# Patient Record
Sex: Female | Born: 1960 | Race: White | Hispanic: No | Smoking: Current every day smoker
Health system: Southern US, Community
[De-identification: ages and names within clinical notes are randomized; demographics above are authoritative.]

## PROBLEM LIST (undated history)

## (undated) DIAGNOSIS — C801 Malignant (primary) neoplasm, unspecified: Secondary | ICD-10-CM

## (undated) DIAGNOSIS — D494 Neoplasm of unspecified behavior of bladder: Secondary | ICD-10-CM

## (undated) DIAGNOSIS — T8859XA Other complications of anesthesia, initial encounter: Secondary | ICD-10-CM

## (undated) DIAGNOSIS — R053 Chronic cough: Secondary | ICD-10-CM

## (undated) DIAGNOSIS — R05 Cough: Secondary | ICD-10-CM

## (undated) DIAGNOSIS — T4145XA Adverse effect of unspecified anesthetic, initial encounter: Secondary | ICD-10-CM

---
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
## 2000-11-27 ENCOUNTER — Encounter: Payer: Self-pay | Admitting: *Deleted

## 2000-11-27 ENCOUNTER — Ambulatory Visit (HOSPITAL_COMMUNITY): Admission: RE | Admit: 2000-11-27 | Discharge: 2000-11-27 | Payer: Self-pay | Admitting: *Deleted

## 2000-11-30 ENCOUNTER — Ambulatory Visit (HOSPITAL_COMMUNITY): Admission: RE | Admit: 2000-11-30 | Discharge: 2000-11-30 | Payer: Self-pay | Admitting: *Deleted

## 2000-11-30 ENCOUNTER — Encounter: Payer: Self-pay | Admitting: *Deleted

## 2002-06-27 ENCOUNTER — Emergency Department (HOSPITAL_COMMUNITY): Admission: EM | Admit: 2002-06-27 | Discharge: 2002-06-27 | Payer: Self-pay | Admitting: Emergency Medicine

## 2002-06-27 ENCOUNTER — Encounter: Payer: Self-pay | Admitting: Emergency Medicine

## 2002-11-25 ENCOUNTER — Emergency Department (HOSPITAL_COMMUNITY): Admission: EM | Admit: 2002-11-25 | Discharge: 2002-11-25 | Payer: Self-pay

## 2002-12-06 ENCOUNTER — Encounter: Payer: Self-pay | Admitting: *Deleted

## 2002-12-06 ENCOUNTER — Ambulatory Visit (HOSPITAL_COMMUNITY): Admission: RE | Admit: 2002-12-06 | Discharge: 2002-12-06 | Payer: Self-pay | Admitting: *Deleted

## 2003-10-07 ENCOUNTER — Emergency Department (HOSPITAL_COMMUNITY): Admission: EM | Admit: 2003-10-07 | Discharge: 2003-10-07 | Payer: Self-pay | Admitting: Emergency Medicine

## 2006-07-19 ENCOUNTER — Emergency Department (HOSPITAL_COMMUNITY): Admission: EM | Admit: 2006-07-19 | Discharge: 2006-07-19 | Payer: Self-pay | Admitting: Emergency Medicine

## 2015-05-13 ENCOUNTER — Emergency Department (HOSPITAL_COMMUNITY)
Admission: EM | Admit: 2015-05-13 | Discharge: 2015-05-13 | Disposition: A | Discharge status: Home / Self Care | Payer: No Typology Code available for payment source | Attending: Emergency Medicine | Admitting: Emergency Medicine

## 2015-05-13 ENCOUNTER — Encounter (HOSPITAL_COMMUNITY): Payer: Self-pay | Admitting: Emergency Medicine

## 2015-05-13 ENCOUNTER — Emergency Department (HOSPITAL_COMMUNITY): Payer: No Typology Code available for payment source

## 2015-05-13 DIAGNOSIS — F17200 Nicotine dependence, unspecified, uncomplicated: Secondary | ICD-10-CM | POA: Insufficient documentation

## 2015-05-13 DIAGNOSIS — Y998 Other external cause status: Secondary | ICD-10-CM | POA: Insufficient documentation

## 2015-05-13 DIAGNOSIS — Y9389 Activity, other specified: Secondary | ICD-10-CM | POA: Diagnosis not present

## 2015-05-13 DIAGNOSIS — S0093XA Contusion of unspecified part of head, initial encounter: Secondary | ICD-10-CM

## 2015-05-13 DIAGNOSIS — S0990XA Unspecified injury of head, initial encounter: Secondary | ICD-10-CM | POA: Insufficient documentation

## 2015-05-13 DIAGNOSIS — Y92410 Unspecified street and highway as the place of occurrence of the external cause: Secondary | ICD-10-CM | POA: Diagnosis not present

## 2015-05-13 DIAGNOSIS — S0083XA Contusion of other part of head, initial encounter: Secondary | ICD-10-CM | POA: Insufficient documentation

## 2015-05-13 DIAGNOSIS — S4992XA Unspecified injury of left shoulder and upper arm, initial encounter: Secondary | ICD-10-CM | POA: Diagnosis not present

## 2015-05-13 DIAGNOSIS — S4991XA Unspecified injury of right shoulder and upper arm, initial encounter: Secondary | ICD-10-CM | POA: Insufficient documentation

## 2015-05-13 DIAGNOSIS — S161XXA Strain of muscle, fascia and tendon at neck level, initial encounter: Secondary | ICD-10-CM | POA: Insufficient documentation

## 2015-05-13 DIAGNOSIS — S0993XA Unspecified injury of face, initial encounter: Secondary | ICD-10-CM | POA: Diagnosis present

## 2015-05-13 MED ORDER — HYDROCODONE-ACETAMINOPHEN 5-325 MG PO TABS: 1.0000 | ORAL_TABLET | Freq: Four times a day (QID) | ORAL | Status: DC | PRN
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
## 2015-05-13 NOTE — ED Notes (Signed)
Pt stable, ambulatory, states understanding of discharge instructions 
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
## 2015-05-13 NOTE — Discharge Instructions (Signed)
Continue to take ibuprofen as needed for pain. Apply to the areas. Return if symptoms worsen. Do not drive if you take the narcotic as it will make you sleepy.

## 2018-08-28 ENCOUNTER — Other Ambulatory Visit: Payer: Self-pay | Admitting: Family Medicine

## 2018-08-28 ENCOUNTER — Other Ambulatory Visit: Payer: Self-pay

## 2018-08-28 ENCOUNTER — Ambulatory Visit
Admission: RE | Admit: 2018-08-28 | Discharge: 2018-08-28 | Disposition: A | Discharge status: Home / Self Care | Payer: No Typology Code available for payment source | Source: Ambulatory Visit | Attending: Family Medicine | Admitting: Family Medicine

## 2018-08-28 DIAGNOSIS — M545 Low back pain, unspecified: Secondary | ICD-10-CM

## 2018-08-28 DIAGNOSIS — M79604 Pain in right leg: Secondary | ICD-10-CM

## 2018-10-15 ENCOUNTER — Other Ambulatory Visit (HOSPITAL_COMMUNITY): Payer: Self-pay | Admitting: Urology

## 2018-10-15 ENCOUNTER — Other Ambulatory Visit: Payer: Self-pay | Admitting: Urology

## 2018-10-15 DIAGNOSIS — D414 Neoplasm of uncertain behavior of bladder: Secondary | ICD-10-CM

## 2018-10-18 ENCOUNTER — Other Ambulatory Visit (HOSPITAL_COMMUNITY): Payer: Self-pay | Admitting: Urology

## 2018-10-18 ENCOUNTER — Ambulatory Visit (HOSPITAL_COMMUNITY)
Admission: RE | Admit: 2018-10-18 | Discharge: 2018-10-18 | Disposition: A | Discharge status: Home / Self Care | Payer: Medicaid Other | Source: Ambulatory Visit | Attending: Urology | Admitting: Urology

## 2018-10-18 ENCOUNTER — Encounter (HOSPITAL_COMMUNITY): Payer: Self-pay

## 2018-10-18 ENCOUNTER — Other Ambulatory Visit: Payer: Self-pay

## 2018-10-18 DIAGNOSIS — D414 Neoplasm of uncertain behavior of bladder: Secondary | ICD-10-CM | POA: Diagnosis not present

## 2018-10-18 DIAGNOSIS — D494 Neoplasm of unspecified behavior of bladder: Secondary | ICD-10-CM

## 2018-10-18 LAB — POCT I-STAT CREATININE: Creatinine, Ser: 0.7 mg/dL (ref 0.44–1.00)

## 2018-10-18 MED ORDER — SODIUM CHLORIDE (PF) 0.9 % IJ SOLN
INTRAMUSCULAR | Status: AC
  Filled 2018-10-18: qty 50

## 2018-10-18 MED ORDER — IOHEXOL 300 MG/ML  SOLN
100.0000 mL | Freq: Once | INTRAMUSCULAR | Status: AC | PRN
  Administered 2018-10-18: 100 mL via INTRAVENOUS

## 2018-10-18 MED ORDER — SODIUM CHLORIDE 0.9 % IV SOLN
INTRAVENOUS | Status: AC
  Filled 2018-10-18: qty 250

## 2018-10-22 ENCOUNTER — Other Ambulatory Visit: Payer: Self-pay | Admitting: Urology

## 2018-10-23 ENCOUNTER — Other Ambulatory Visit (HOSPITAL_COMMUNITY): Payer: Self-pay

## 2018-10-23 ENCOUNTER — Ambulatory Visit (HOSPITAL_COMMUNITY): Payer: Self-pay

## 2018-10-25 NOTE — Patient Instructions (Addendum)
YOU NEED TO HAVE A COVID 19 TEST ON______Wednesday, July 6th_______, THIS TEST MUST BE DONE BEFORE SURGERY, COME TO [place] ENTRANCE. ONCE YOUR COVID TEST IS COMPLETED, PLEASE BEGIN THE QUARANTINE INSTRUCTIONS AS OUTLINED IN YOUR HANDOUT.                  Lauren Salazar      Your procedure is scheduled on: 10-31-2018    Report to [facility] Main  Entrance    Report to admitting at 8:30 AM      Call this number if you have problems the morning of surgery [phone]    Remember: Do not eat food or drink liquids :After Midnight. BRUSH YOUR TEETH MORNING OF SURGERY AND RINSE YOUR MOUTH OUT, NO CHEWING GUM CANDY OR MINTS.     Take these medicines the morning of surgery with A SIP OF WATER: tramadol if needed                                You may not have any metal on your body including hair pins and              piercings  Do not wear jewelry, make-up, lotions, powders or perfumes, deodorant             Do not wear nail polish.  Do not shave  48 hours prior to surgery.                 Do not bring valuables to the hospital. [place].  Contacts, dentures or bridgework may not be worn into surgery.      Patients discharged the day of surgery will not be allowed to drive home. IF YOU ARE HAVING SURGERY AND GOING HOME THE SAME DAY, YOU MUST HAVE AN ADULT TO DRIVE YOU HOME AND BE WITH YOU FOR 24 HOURS. YOU MAY GO HOME BY TAXI OR UBER OR ORTHERWISE, BUT AN ADULT MUST ACCOMPANY YOU HOME AND STAY WITH YOU FOR 24 HOURS.  Name and phone number of your driver:  Special Instructions: N/A              Please read over the following fact sheets you were given: _____________________________________________________________________             [facility] - Preparing for Surgery Before surgery, you can play an important role.  Because skin is not sterile, your skin needs to be as free of germs  as possible.  You can reduce the number of germs on your skin by washing with CHG (chlorahexidine gluconate) soap before surgery.  CHG is an antiseptic cleaner which kills germs and bonds with the skin to continue killing germs even after washing. Please DO NOT use if you have an allergy to CHG or antibacterial soaps.  If your skin becomes reddened/irritated stop using the CHG and inform your nurse when you arrive at Short Stay. Do not shave (including legs and underarms) for at least 48 hours prior to the first CHG shower.  You may shave your face/neck. Please follow these instructions carefully:  1.  Shower with CHG Soap the night before surgery and the  morning of Surgery.  2.  If you choose to wash your hair, wash your hair first as usual with your  normal  shampoo.  3.  After you shampoo, rinse your hair and body thoroughly to remove the  shampoo.                           4.  Use CHG as you would any other liquid soap.  You can apply chg directly  to the skin and wash                       Gently with a scrungie or clean washcloth.  5.  Apply the CHG Soap to your body ONLY FROM THE NECK DOWN.   Do not use on face/ open                           Wound or open sores. Avoid contact with eyes, ears mouth and genitals (private parts).                       Wash face,  Genitals (private parts) with your normal soap.             6.  Wash thoroughly, paying special attention to the area where your surgery  will be performed.  7.  Thoroughly rinse your body with warm water from the neck down.  8.  DO NOT shower/wash with your normal soap after using and rinsing off  the CHG Soap.                9.  Pat yourself dry with a clean towel.            10.  Wear clean pajamas.            11.  Place clean sheets on your bed the night of your first shower and do not  sleep with pets. Day of Surgery : Do not apply any lotions/deodorants the morning of surgery.  Please wear clean clothes to the hospital/surgery  center.  FAILURE TO FOLLOW THESE INSTRUCTIONS MAY RESULT IN THE CANCELLATION OF YOUR SURGERY PATIENT SIGNATURE_________________________________  NURSE SIGNATURE__________________________________  ________________________________________________________________________

## 2018-10-29 ENCOUNTER — Other Ambulatory Visit: Payer: Self-pay

## 2018-10-29 ENCOUNTER — Encounter (HOSPITAL_COMMUNITY): Payer: Self-pay

## 2018-10-29 ENCOUNTER — Encounter (HOSPITAL_COMMUNITY)
Admission: RE | Admit: 2018-10-29 | Discharge: 2018-10-29 | Disposition: A | Discharge status: Home / Self Care | Payer: Self-pay | Source: Ambulatory Visit | Attending: Urology | Admitting: Urology

## 2018-10-29 ENCOUNTER — Other Ambulatory Visit (HOSPITAL_COMMUNITY)
Admission: RE | Admit: 2018-10-29 | Discharge: 2018-10-29 | Disposition: A | Discharge status: Home / Self Care | Payer: Self-pay | Source: Ambulatory Visit

## 2018-10-29 DIAGNOSIS — Z01812 Encounter for preprocedural laboratory examination: Secondary | ICD-10-CM | POA: Insufficient documentation

## 2018-10-29 DIAGNOSIS — C679 Malignant neoplasm of bladder, unspecified: Secondary | ICD-10-CM | POA: Insufficient documentation

## 2018-10-29 DIAGNOSIS — Z1159 Encounter for screening for other viral diseases: Secondary | ICD-10-CM | POA: Insufficient documentation

## 2018-10-29 HISTORY — DX: Other complications of anesthesia, initial encounter: T88.59XA

## 2018-10-29 HISTORY — DX: Chronic cough: R05.3

## 2018-10-29 HISTORY — DX: Neoplasm of unspecified behavior of bladder: D49.4

## 2018-10-29 LAB — BASIC METABOLIC PANEL
Anion gap: 8 (ref 5–15)
BUN: 11 mg/dL (ref 6–20)
CO2: 28 mmol/L (ref 22–32)
Calcium: 8.8 mg/dL — ABNORMAL LOW (ref 8.9–10.3)
Chloride: 102 mmol/L (ref 98–111)
Creatinine, Ser: 0.67 mg/dL (ref 0.44–1.00)
GFR calc Af Amer: >60 mL/min (ref >60)
GFR calc non Af Amer: >60 mL/min (ref >60)
Glucose, Bld: 137 mg/dL — ABNORMAL HIGH (ref 70–99)
Potassium: 3.8 mmol/L (ref 3.5–5.1)
Sodium: 138 mmol/L (ref 135–145)

## 2018-10-29 LAB — CBC
HCT: 42 % (ref 36.0–46.0)
Hemoglobin: 13.1 g/dL (ref 12.0–15.0)
MCH: 29.7 pg (ref 26.0–34.0)
MCHC: 31.2 g/dL (ref 30.0–36.0)
MCV: 95.2 fL (ref 80.0–100.0)
Platelets: 310 10*3/uL (ref 150–400)
RBC: 4.41 MIL/uL (ref 3.87–5.11)
RDW: 12.9 % (ref 11.5–15.5)
WBC: 10.9 10*3/uL — ABNORMAL HIGH (ref 4.0–10.5)
nRBC: 0 % (ref 0.0–0.2)

## 2018-10-30 LAB — SARS CORONAVIRUS 2 (TAT 6-24 HRS): SARS Coronavirus 2: NEGATIVE

## 2018-10-31 ENCOUNTER — Ambulatory Visit (HOSPITAL_COMMUNITY): Payer: Medicaid Other

## 2018-10-31 ENCOUNTER — Ambulatory Visit (HOSPITAL_COMMUNITY): Payer: Medicaid Other | Admitting: Anesthesiology

## 2018-10-31 ENCOUNTER — Other Ambulatory Visit: Payer: Self-pay

## 2018-10-31 ENCOUNTER — Encounter (HOSPITAL_COMMUNITY): Payer: Self-pay | Admitting: *Deleted

## 2018-10-31 ENCOUNTER — Ambulatory Visit (HOSPITAL_COMMUNITY): Payer: Medicaid Other | Admitting: Physician Assistant

## 2018-10-31 ENCOUNTER — Ambulatory Visit (HOSPITAL_COMMUNITY)
Admission: RE | Admit: 2018-10-31 | Discharge: 2018-10-31 | Disposition: A | Discharge status: Home / Self Care | Payer: Medicaid Other | Attending: Urology | Admitting: Urology

## 2018-10-31 ENCOUNTER — Encounter (HOSPITAL_COMMUNITY)
Admission: RE | Disposition: A | Discharge status: Home / Self Care | Payer: Self-pay | Source: Home / Self Care | Attending: Urology

## 2018-10-31 DIAGNOSIS — F17210 Nicotine dependence, cigarettes, uncomplicated: Secondary | ICD-10-CM | POA: Diagnosis not present

## 2018-10-31 DIAGNOSIS — C674 Malignant neoplasm of posterior wall of bladder: Secondary | ICD-10-CM | POA: Insufficient documentation

## 2018-10-31 DIAGNOSIS — N3289 Other specified disorders of bladder: Secondary | ICD-10-CM | POA: Diagnosis present

## 2018-10-31 HISTORY — PX: TRANSURETHRAL RESECTION OF BLADDER TUMOR: SHX2575

## 2018-10-31 SURGERY — TURBT (TRANSURETHRAL RESECTION OF BLADDER TUMOR)
Anesthesia: General | Site: Urethra

## 2018-10-31 MED ORDER — FENTANYL CITRATE (PF) 100 MCG/2ML IJ SOLN
25.0000 ug | INTRAMUSCULAR | Status: DC | PRN
  Administered 2018-10-31 (×3): 50 ug via INTRAVENOUS

## 2018-10-31 MED ORDER — SODIUM CHLORIDE 0.9 % IR SOLN
Status: DC | PRN
  Administered 2018-10-31: 12000 mL via INTRAVESICAL

## 2018-10-31 MED ORDER — SUGAMMADEX SODIUM 200 MG/2ML IV SOLN
INTRAVENOUS | Status: AC
  Filled 2018-10-31: qty 2

## 2018-10-31 MED ORDER — CEFAZOLIN SODIUM-DEXTROSE 2-4 GM/100ML-% IV SOLN
2.0000 g | Freq: Once | INTRAVENOUS | Status: AC
  Administered 2018-10-31: 2 g via INTRAVENOUS
  Filled 2018-10-31: qty 100

## 2018-10-31 MED ORDER — LIDOCAINE 2% (20 MG/ML) 5 ML SYRINGE
INTRAMUSCULAR | Status: AC
  Filled 2018-10-31: qty 5

## 2018-10-31 MED ORDER — ROCURONIUM BROMIDE 10 MG/ML (PF) SYRINGE
PREFILLED_SYRINGE | INTRAVENOUS | Status: DC | PRN
  Administered 2018-10-31: 10 mg via INTRAVENOUS
  Administered 2018-10-31: 30 mg via INTRAVENOUS

## 2018-10-31 MED ORDER — FENTANYL CITRATE (PF) 100 MCG/2ML IJ SOLN
INTRAMUSCULAR | Status: AC
  Filled 2018-10-31: qty 2

## 2018-10-31 MED ORDER — SCOPOLAMINE 1 MG/3DAYS TD PT72: 1.0000 | MEDICATED_PATCH | TRANSDERMAL | Status: DC

## 2018-10-31 MED ORDER — DEXAMETHASONE SODIUM PHOSPHATE 10 MG/ML IJ SOLN
INTRAMUSCULAR | Status: DC | PRN
  Administered 2018-10-31: 10 mg via INTRAVENOUS

## 2018-10-31 MED ORDER — PROPOFOL 10 MG/ML IV BOLUS
INTRAVENOUS | Status: DC | PRN
  Administered 2018-10-31: 110 mg via INTRAVENOUS

## 2018-10-31 MED ORDER — OXYBUTYNIN CHLORIDE 5 MG PO TABS
ORAL_TABLET | ORAL | Status: AC
  Filled 2018-10-31: qty 1

## 2018-10-31 MED ORDER — HYDROCODONE-ACETAMINOPHEN 5-325 MG PO TABS: 1.0000 | ORAL_TABLET | ORAL | 0 refills | Status: DC | PRN

## 2018-10-31 MED ORDER — OXYCODONE HCL 5 MG PO TABS
5.0000 mg | ORAL_TABLET | Freq: Once | ORAL | Status: AC | PRN
  Administered 2018-10-31: 5 mg via ORAL

## 2018-10-31 MED ORDER — OXYBUTYNIN CHLORIDE 5 MG PO TABS
5.0000 mg | ORAL_TABLET | Freq: Once | ORAL | Status: AC
  Administered 2018-10-31: 13:00:00 5 mg via ORAL

## 2018-10-31 MED ORDER — LIDOCAINE 2% (20 MG/ML) 5 ML SYRINGE
INTRAMUSCULAR | Status: DC | PRN
  Administered 2018-10-31: 100 mg via INTRAVENOUS

## 2018-10-31 MED ORDER — PROPOFOL 10 MG/ML IV BOLUS
INTRAVENOUS | Status: AC
  Filled 2018-10-31: qty 20

## 2018-10-31 MED ORDER — FENTANYL CITRATE (PF) 100 MCG/2ML IJ SOLN
INTRAMUSCULAR | Status: AC
  Filled 2018-10-31: qty 4

## 2018-10-31 MED ORDER — MIDAZOLAM HCL 2 MG/2ML IJ SOLN
INTRAMUSCULAR | Status: DC | PRN
  Administered 2018-10-31: 1 mg via INTRAVENOUS

## 2018-10-31 MED ORDER — ONDANSETRON HCL 4 MG/2ML IJ SOLN
INTRAMUSCULAR | Status: AC
  Filled 2018-10-31: qty 2

## 2018-10-31 MED ORDER — GEMCITABINE CHEMO FOR BLADDER INSTILLATION 2000 MG
2000.0000 mg | Freq: Once | INTRAVENOUS | Status: AC
  Administered 2018-10-31: 2000 mg via INTRAVESICAL
  Filled 2018-10-31: qty 2000

## 2018-10-31 MED ORDER — SUCCINYLCHOLINE CHLORIDE 200 MG/10ML IV SOSY
PREFILLED_SYRINGE | INTRAVENOUS | Status: DC | PRN
  Administered 2018-10-31: 100 mg via INTRAVENOUS

## 2018-10-31 MED ORDER — ONDANSETRON HCL 4 MG/2ML IJ SOLN
INTRAMUSCULAR | Status: DC | PRN
  Administered 2018-10-31: 4 mg via INTRAVENOUS

## 2018-10-31 MED ORDER — OXYCODONE HCL 5 MG/5ML PO SOLN: 5.0000 mg | Freq: Once | ORAL | Status: AC | PRN

## 2018-10-31 MED ORDER — FENTANYL CITRATE (PF) 100 MCG/2ML IJ SOLN
INTRAMUSCULAR | Status: DC | PRN
  Administered 2018-10-31 (×2): 50 ug via INTRAVENOUS
  Administered 2018-10-31: 100 ug via INTRAVENOUS

## 2018-10-31 MED ORDER — PHENAZOPYRIDINE HCL 200 MG PO TABS: 200.0000 mg | ORAL_TABLET | Freq: Three times a day (TID) | ORAL | 0 refills | Status: DC | PRN

## 2018-10-31 MED ORDER — DEXAMETHASONE SODIUM PHOSPHATE 10 MG/ML IJ SOLN
INTRAMUSCULAR | Status: AC
  Filled 2018-10-31: qty 1

## 2018-10-31 MED ORDER — MIDAZOLAM HCL 2 MG/2ML IJ SOLN
INTRAMUSCULAR | Status: AC
  Filled 2018-10-31: qty 2

## 2018-10-31 MED ORDER — LACTATED RINGERS IV SOLN
INTRAVENOUS | Status: DC
  Administered 2018-10-31: 09:00:00 via INTRAVENOUS

## 2018-10-31 MED ORDER — MEPERIDINE HCL 50 MG/ML IJ SOLN: 6.2500 mg | INTRAMUSCULAR | Status: DC | PRN

## 2018-10-31 MED ORDER — OXYBUTYNIN CHLORIDE 5 MG PO TABS: 5.0000 mg | ORAL_TABLET | Freq: Three times a day (TID) | ORAL | 1 refills | Status: DC | PRN

## 2018-10-31 MED ORDER — SUGAMMADEX SODIUM 200 MG/2ML IV SOLN
INTRAVENOUS | Status: DC | PRN
  Administered 2018-10-31: 150 mg via INTRAVENOUS

## 2018-10-31 MED ORDER — CEPHALEXIN 500 MG PO CAPS: 500.0000 mg | ORAL_CAPSULE | Freq: Two times a day (BID) | ORAL | 0 refills | Status: AC

## 2018-10-31 MED ORDER — OXYCODONE HCL 5 MG PO TABS
ORAL_TABLET | ORAL | Status: AC
  Filled 2018-10-31: qty 1

## 2018-10-31 MED ORDER — ONDANSETRON HCL 4 MG/2ML IJ SOLN
4.0000 mg | Freq: Once | INTRAMUSCULAR | Status: AC | PRN
  Administered 2018-10-31: 4 mg via INTRAVENOUS

## 2018-10-31 SURGICAL SUPPLY — 20 items
BAG URINE DRAINAGE (UROLOGICAL SUPPLIES) ×1 IMPLANT
BAG URO CATCHER STRL LF (MISCELLANEOUS) ×2 IMPLANT
CATH FOLEY 2WAY SLVR  5CC 18FR (CATHETERS) ×1
CATH FOLEY 2WAY SLVR 5CC 18FR (CATHETERS) IMPLANT
CATH URET 5FR 28IN OPEN ENDED (CATHETERS) ×1 IMPLANT
COVER WAND RF STERILE (DRAPES) IMPLANT
ELECT REM PT RETURN 15FT ADLT (MISCELLANEOUS) ×1 IMPLANT
GLOVE BIOGEL M STRL SZ7.5 (GLOVE) ×2 IMPLANT
GOWN STRL REUS W/TWL LRG LVL3 (GOWN DISPOSABLE) ×1 IMPLANT
GOWN STRL REUS W/TWL XL LVL3 (GOWN DISPOSABLE) ×2 IMPLANT
KIT TURNOVER KIT A (KITS) ×1 IMPLANT
LOOP CUT BIPOLAR 24F LRG (ELECTROSURGICAL) ×1 IMPLANT
MANIFOLD NEPTUNE II (INSTRUMENTS) ×2 IMPLANT
PACK CYSTO (CUSTOM PROCEDURE TRAY) ×2 IMPLANT
PLUG CATH AND CAP STER (CATHETERS) ×1 IMPLANT
SET ASPIRATION TUBING (TUBING) IMPLANT
SYRINGE IRR TOOMEY STRL 70CC (SYRINGE) ×1 IMPLANT
TUBING CONNECTING 10 (TUBING) ×2 IMPLANT
TUBING UROLOGY SET (TUBING) ×2 IMPLANT
WATER STERILE IRR 500ML POUR (IV SOLUTION) ×1 IMPLANT

## 2018-10-31 NOTE — Op Note (Signed)
Operative Note  Preoperative diagnosis:  1.  6 cm posterior bladder wall tumor  Postoperative diagnosis: Same  Procedure(s): 1.  TURBT large 2.  Cystogram with intraoperative interpretation of fluoroscopic imaging 3.  Intravesical instillation of gemcitabine  Surgeon: Ellison Hughs, MD  Assistants:  None  Anesthesia:  General  Complications:  None  EBL: 50 mL  Specimens: 1.  Superficial and deep margins of posterior bladder tumor  Drains/Catheters: 1.  18 French Foley catheter  Intraoperative findings:   1. 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 Cystogram revealed no intra-or extraperitoneal bladder perforation after filling the bladder with ~300 mL of saline with Omnipaque.  There wsa no obvious evidence of bladder perforation seen on post drainage films either.  Indication:  Lauren Salazar is a 58 y.o. female initially seen for recurrent episodes of gross hematuria.  She eventually had a cystoscopy in the office that revealed a large sessile posterior bladder wall tumor, concerning for urothelial carcinoma.  She has been consented for the above procedures, voices understanding and wishes to proceed.  Description of procedure:  The patient was taken to the operating room and administered general anesthesia. They were then placed on the table and moved to the dorsal lithotomy position after which the genitalia was sterilely prepped and draped. An official timeout was then performed.  The 85 French resectoscope with the 30 lens and visual obturator were then passed into the bladder under direct visualization. Urethra appeared normal. The visual obturator was then removed and the Gyrus resectoscope element with 30  lens was then inserted and the bladder was fully and systematically inspected. Ureteral orifices were noted to be in the normal anatomic positions.   I first began by resecting the 6 cm papillary  tumor along the posterior wall of the bladder wall, starting superficially and then progressing deeper down to the detrusor musculature.  The Microvasive evacuator was then used to irrigate the bladder and remove all of the portions of bladder tumor which were sent to pathology.  I sent superficial and deep margins of the bladder tumor as separate specimens.  Reinspection of the bladder revealed all obvious tumor had been fully resected and there was no evidence of perforation.  I then removed the resectoscope.  An 80 French Foley catheter was then inserted in the bladder and irrigated.  A cystogram was obtained, with the findings listed above.  The irrigant returned slightly pink with no clots. The patient was awakened and taken to the recovery room.  While in the recovery room 2000 mg of gemcitabine in 50 mL of water was instilled in the bladder through the catheter and the catheter was plugged. This will remain indwelling for approximately one hour. It will then be drained from the bladder and the catheter will be removed and the patient discharged home.   Plan:  Discharge home without Foley catheter

## 2018-10-31 NOTE — Anesthesia Postprocedure Evaluation (Signed)
Anesthesia Post Note  Patient: Lauren Salazar  Procedure(s) Performed: TRANSURETHRAL RESECTION OF BLADDER TUMOR (TURBT) WITH CYSTOSCOPY/ POSSIBLE INTRAVESICAL GEMCITABINE (N/A Urethra)     Patient location during evaluation: PACU Anesthesia Type: General Level of consciousness: awake and alert and oriented Pain management: pain level controlled Vital Signs Assessment: post-procedure vital signs reviewed and stable Respiratory status: spontaneous breathing, nonlabored ventilation and respiratory function stable Cardiovascular status: blood pressure returned to baseline and stable Postop Assessment: no apparent nausea or vomiting Anesthetic complications: no    Last Vitals:  Vitals:   10/31/18 1200 10/31/18 1215  BP: (!) 159/90 (!) 162/85  Pulse: 79 82  Resp: 17 18  Temp:    SpO2: 100% 97%    Last Pain:  Vitals:   10/31/18 1215  TempSrc:   PainSc: 3                  Areta Terwilliger A.

## 2018-10-31 NOTE — Transfer of Care (Signed)
Immediate Anesthesia Transfer of Care Note  Patient: Lauren Salazar  Procedure(s) Performed: TRANSURETHRAL RESECTION OF BLADDER TUMOR (TURBT) WITH CYSTOSCOPY/ POSSIBLE INTRAVESICAL GEMCITABINE (N/A Urethra)  Patient Location: PACU  Anesthesia Type:General  Level of Consciousness: awake and oriented  Airway & Oxygen Therapy: Patient Spontanous Breathing and Patient connected to face mask oxygen  Post-op Assessment: Report given to RN and Post -op Vital signs reviewed and stable  Post vital signs: Reviewed and stable  Last Vitals:  Vitals Value Taken Time  BP    Temp    Pulse    Resp    SpO2      Last Pain:  Vitals:   10/31/18 0845  TempSrc: Oral      Patients Stated Pain Goal: 3 (53/00/51 1021)  Complications: No apparent anesthesia complications

## 2018-10-31 NOTE — Anesthesia Procedure Notes (Signed)
Procedure Name: Intubation Date/Time: 10/31/2018 10:13 AM Performed by: Sharlette Dense, CRNA Patient Re-evaluated:Patient Re-evaluated prior to induction Oxygen Delivery Method: Circle system utilized Preoxygenation: Pre-oxygenation with 100% oxygen Induction Type: IV induction and Rapid sequence Laryngoscope Size: Miller and 2 Grade View: Grade I Tube type: Oral Tube size: 7.5 mm Number of attempts: 1 Airway Equipment and Method: Stylet Placement Confirmation: ETT inserted through vocal cords under direct vision,  positive ETCO2 and breath sounds checked- equal and bilateral Secured at: 21 cm Tube secured with: Tape Dental Injury: Teeth and Oropharynx as per pre-operative assessment

## 2018-10-31 NOTE — Anesthesia Preprocedure Evaluation (Addendum)
Anesthesia Evaluation  Patient identified by MRN, date of birth, ID band Patient awake    Reviewed: Allergy & Precautions, NPO status , Patient's Chart, lab work & pertinent test results  History of Anesthesia Complications (+) PROLONGED EMERGENCE, Emergence Delirium and history of anesthetic complications  Airway Mallampati: II  TM Distance: >3 FB Neck ROM: Full    Dental  (+) Teeth Intact, Partial Upper,  Permanent upper bridge:   Pulmonary Current Smoker,    Pulmonary exam normal breath sounds clear to auscultation       Cardiovascular negative cardio ROS Normal cardiovascular exam Rhythm:Regular Rate:Normal     Neuro/Psych negative neurological ROS  negative psych ROS   GI/Hepatic negative GI ROS, Neg liver ROS,   Endo/Other  negative endocrine ROS  Renal/GU negative Renal ROS   Bladder Tumor    Musculoskeletal negative musculoskeletal ROS (+)   Abdominal   Peds  Hematology negative hematology ROS (+)   Anesthesia Other Findings   Reproductive/Obstetrics                            Anesthesia Physical Anesthesia Plan  ASA: II  Anesthesia Plan: General   Post-op Pain Management:    Induction: Intravenous  PONV Risk Score and Plan: 4 or greater and Midazolam, Ondansetron, Dexamethasone and Treatment may vary due to age or medical condition  Airway Management Planned: Oral ETT  Additional Equipment:   Intra-op Plan:   Post-operative Plan: Extubation in OR  Informed Consent: I have reviewed the patients History and Physical, chart, labs and discussed the procedure including the risks, benefits and alternatives for the proposed anesthesia with the patient or authorized representative who has indicated his/her understanding and acceptance.     Dental advisory given  Plan Discussed with: CRNA  Anesthesia Plan Comments:         Anesthesia Quick Evaluation

## 2018-10-31 NOTE — H&P (Signed)
Urology Preoperative H&P   Chief Complaint: Bladder tumor  History of Present Illness: Lauren Salazar is a 58 y.o. female with a papillary bladder mass seen on cystoscopy from 10/11/18 as well as on CTU from 10/18/18.  She initially presented with intermittent episodes of gross hematuria over the past year.  She has an extensive history of smoking (>40 years).  She is here today for TURBT.     Past Medical History:  Diagnosis Date  . Bladder tumor   . Complication of anesthesia    slow to wake after spinal fusion, then was "looney" for the following 24 hours   . Persistent dry cough    ongoing since feb 2020    Past Surgical History:  Procedure Laterality Date  . CERVICAL FUSION  2005   c5-c6       Allergies: No Known Allergies  No family history on file.  Social History:  reports that she has been smoking cigarettes. She has been smoking about 1.00 pack per day. She has never used smokeless tobacco. She reports that she does not drink alcohol or use drugs.  ROS: A complete review of systems was performed.  All systems are negative except for pertinent findings as noted.  Physical Exam:  Vital signs in last 24 hours:   Constitutional:  Alert and oriented, No acute distress Cardiovascular: Regular rate and rhythm, No JVD Respiratory: Normal respiratory effort, Lungs clear bilaterally GI: Abdomen is soft, nontender, nondistended, no abdominal masses GU: No CVA tenderness Lymphatic: No lymphadenopathy Neurologic: Grossly intact, no focal deficits Psychiatric: Normal mood and affect  Laboratory Data:  Recent Labs    10/29/18 1530  WBC 10.9*  HGB 13.1  HCT 42.0  PLT 310    Recent Labs    10/29/18 1530  NA 138  K 3.8  CL 102  GLUCOSE 137*  BUN 11  CALCIUM 8.8*  CREATININE 0.67     No results found for this or any previous visit (from the past 24 hour(s)). Recent Results (from the past 240 hour(s))  SARS Coronavirus 2 (Performed in [place]  hospital lab)     Status: None   Collection Time: 10/29/18  2:42 PM   Specimen: Nasal Swab  Result Value Ref Range Status   SARS Coronavirus 2 NEGATIVE NEGATIVE Final    Comment: (NOTE) SARS-CoV-2 target nucleic acids are NOT DETECTED. The SARS-CoV-2 RNA is generally detectable in upper and lower respiratory specimens during the acute phase of infection. Negative results do not preclude SARS-CoV-2 infection, do not rule out co-infections with other pathogens, and should not be used as the sole basis for treatment or other patient management decisions. Negative results must be combined with clinical observations, patient history, and epidemiological information. The expected result is Negative. Fact Sheet for Patients: SugarRoll.be Fact Sheet for Healthcare Providers: https://www.woods-mathews.com/ This test is not yet approved or cleared by the [place] FDA and  has been authorized for detection and/or diagnosis of SARS-CoV-2 by FDA under an Emergency Use Authorization (EUA). This EUA will remain  in effect (meaning this test can be used) for the duration of the COVID-19 declaration under Section 56 4(b)(1) of the Act, 21 U.S.C. section 360bbb-3(b)(1), unless the authorization is terminated or revoked sooner. Performed at [facility], [place] [address] [place], [place] [address]     Renal Function: Recent Labs    10/29/18 1530  CREATININE 0.67   Estimated Creatinine Clearance: 70.9 mL/min (by C-G formula based on SCr of  0.67 mg/dL).  Radiologic Imaging: No results found.  I independently reviewed the above imaging studies.  Assessment and Plan [name] is a 58 y.o. 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    The risks, benefits and alternatives of cystoscopy with TURBT was discussed with the patient.  The risks include, but are not limited to, bleeding,  urinary tract  infection, bladder perforation requiring prolonged catheterization and/or open bladder repair, ureteral obstruction, voiding dysfunction and the inherent risks of general anesthesia.  The patient voices understanding and wishes to proceed.   Ellison Hughs, MD 10/31/2018, 7:37 AM  [facility] Pager: [phone]

## 2018-11-01 ENCOUNTER — Encounter (HOSPITAL_COMMUNITY): Payer: Self-pay | Admitting: Urology

## 2018-11-13 ENCOUNTER — Other Ambulatory Visit (HOSPITAL_COMMUNITY): Payer: Self-pay | Admitting: Urology

## 2018-11-13 DIAGNOSIS — R918 Other nonspecific abnormal finding of lung field: Secondary | ICD-10-CM

## 2018-11-13 LAB — PROTIME-INR

## 2018-11-20 ENCOUNTER — Other Ambulatory Visit (HOSPITAL_COMMUNITY): Payer: Self-pay | Admitting: Urology

## 2018-11-20 DIAGNOSIS — R918 Other nonspecific abnormal finding of lung field: Secondary | ICD-10-CM

## 2018-11-22 ENCOUNTER — Ambulatory Visit (HOSPITAL_COMMUNITY)
Admission: RE | Admit: 2018-11-22 | Discharge: 2018-11-22 | Disposition: A | Discharge status: Home / Self Care | Payer: Medicaid Other | Source: Ambulatory Visit | Attending: Urology | Admitting: Urology

## 2018-11-22 ENCOUNTER — Other Ambulatory Visit: Payer: Self-pay

## 2018-11-22 DIAGNOSIS — R918 Other nonspecific abnormal finding of lung field: Secondary | ICD-10-CM | POA: Insufficient documentation

## 2018-11-22 MED ORDER — IOHEXOL 300 MG/ML  SOLN
75.0000 mL | Freq: Once | INTRAMUSCULAR | Status: AC | PRN
  Administered 2018-11-22: 16:00:00 75 mL via INTRAVENOUS

## 2018-11-22 MED ORDER — SODIUM CHLORIDE (PF) 0.9 % IJ SOLN
INTRAMUSCULAR | Status: AC
  Filled 2018-11-22: qty 50

## 2018-12-07 ENCOUNTER — Other Ambulatory Visit (HOSPITAL_COMMUNITY)
Admission: RE | Admit: 2018-12-07 | Discharge: 2018-12-07 | Disposition: A | Discharge status: Home / Self Care | Payer: HRSA Program | Source: Ambulatory Visit | Attending: Urology | Admitting: Urology

## 2018-12-07 ENCOUNTER — Other Ambulatory Visit: Payer: Self-pay | Admitting: Radiology

## 2018-12-07 ENCOUNTER — Ambulatory Visit (HOSPITAL_COMMUNITY): Payer: Medicaid Other

## 2018-12-07 DIAGNOSIS — Z20828 Contact with and (suspected) exposure to other viral communicable diseases: Secondary | ICD-10-CM | POA: Diagnosis not present

## 2018-12-07 DIAGNOSIS — Z01812 Encounter for preprocedural laboratory examination: Secondary | ICD-10-CM | POA: Diagnosis present

## 2018-12-07 LAB — SARS CORONAVIRUS 2 (TAT 6-24 HRS): SARS Coronavirus 2: NEGATIVE

## 2018-12-10 ENCOUNTER — Other Ambulatory Visit: Payer: Self-pay

## 2018-12-10 ENCOUNTER — Encounter (HOSPITAL_COMMUNITY): Payer: Self-pay

## 2018-12-10 ENCOUNTER — Ambulatory Visit (HOSPITAL_COMMUNITY)
Admission: RE | Admit: 2018-12-10 | Discharge: 2018-12-10 | Disposition: A | Discharge status: Home / Self Care | Payer: Medicaid Other | Source: Ambulatory Visit | Attending: Urology | Admitting: Urology

## 2018-12-10 ENCOUNTER — Ambulatory Visit (HOSPITAL_COMMUNITY)
Admission: RE | Admit: 2018-12-10 | Discharge: 2018-12-10 | Disposition: A | Discharge status: Home / Self Care | Payer: Medicaid Other | Source: Ambulatory Visit | Attending: Interventional Radiology | Admitting: Interventional Radiology

## 2018-12-10 DIAGNOSIS — F17210 Nicotine dependence, cigarettes, uncomplicated: Secondary | ICD-10-CM | POA: Diagnosis not present

## 2018-12-10 DIAGNOSIS — Z9889 Other specified postprocedural states: Secondary | ICD-10-CM

## 2018-12-10 DIAGNOSIS — I700 Atherosclerosis of aorta: Secondary | ICD-10-CM | POA: Insufficient documentation

## 2018-12-10 DIAGNOSIS — C679 Malignant neoplasm of bladder, unspecified: Secondary | ICD-10-CM | POA: Insufficient documentation

## 2018-12-10 DIAGNOSIS — Z981 Arthrodesis status: Secondary | ICD-10-CM | POA: Diagnosis not present

## 2018-12-10 DIAGNOSIS — R220 Localized swelling, mass and lump, head: Secondary | ICD-10-CM | POA: Insufficient documentation

## 2018-12-10 DIAGNOSIS — Z79899 Other long term (current) drug therapy: Secondary | ICD-10-CM | POA: Diagnosis not present

## 2018-12-10 DIAGNOSIS — R918 Other nonspecific abnormal finding of lung field: Secondary | ICD-10-CM

## 2018-12-10 DIAGNOSIS — J439 Emphysema, unspecified: Secondary | ICD-10-CM | POA: Insufficient documentation

## 2018-12-10 LAB — CBC
HCT: 43.7 % (ref 36.0–46.0)
Hemoglobin: 13.8 g/dL (ref 12.0–15.0)
MCH: 29.7 pg (ref 26.0–34.0)
MCHC: 31.6 g/dL (ref 30.0–36.0)
MCV: 94.2 fL (ref 80.0–100.0)
Platelets: 229 10*3/uL (ref 150–400)
RBC: 4.64 MIL/uL (ref 3.87–5.11)
RDW: 14.1 % (ref 11.5–15.5)
WBC: 8.4 10*3/uL (ref 4.0–10.5)
nRBC: 0 % (ref 0.0–0.2)

## 2018-12-10 LAB — PROTIME-INR
INR: 0.9 (ref 0.8–1.2)
Prothrombin Time: 12.3 seconds (ref 11.4–15.2)

## 2018-12-10 MED ORDER — FENTANYL CITRATE (PF) 100 MCG/2ML IJ SOLN
INTRAMUSCULAR | Status: AC
  Filled 2018-12-10: qty 2

## 2018-12-10 MED ORDER — FENTANYL CITRATE (PF) 100 MCG/2ML IJ SOLN
INTRAMUSCULAR | Status: AC | PRN
  Administered 2018-12-10 (×2): 25 ug via INTRAVENOUS
  Administered 2018-12-10: 50 ug via INTRAVENOUS

## 2018-12-10 MED ORDER — MIDAZOLAM HCL 2 MG/2ML IJ SOLN
INTRAMUSCULAR | Status: AC
  Filled 2018-12-10: qty 2

## 2018-12-10 MED ORDER — SODIUM CHLORIDE 0.9 % IV SOLN: INTRAVENOUS | Status: DC

## 2018-12-10 MED ORDER — LIDOCAINE HCL 1 % IJ SOLN
INTRAMUSCULAR | Status: AC
  Filled 2018-12-10: qty 20

## 2018-12-10 MED ORDER — MIDAZOLAM HCL 2 MG/2ML IJ SOLN
INTRAMUSCULAR | Status: AC | PRN
  Administered 2018-12-10: 0.5 mg via INTRAVENOUS
  Administered 2018-12-10: 1 mg via INTRAVENOUS
  Administered 2018-12-10: 0.5 mg via INTRAVENOUS

## 2018-12-10 NOTE — Procedures (Signed)
Interventional Radiology Procedure Note  Procedure: CT guided biopsy of RLL pulmonary mass.  Complications: No immediate Recommendations: - Bedrest until CXR cleared.  Minimize talking, coughing or otherwise straining.  - Follow up 2 hr CXR pending   Signed,  Criselda Peaches, MD

## 2018-12-10 NOTE — H&P (Signed)
Chief Complaint: Patient was seen in consultation today for right lung mass biopsy at the request of Winter,Christopher Marjory Lies  Referring Physician(s): Winter,Christopher Marjory Lies  Supervising Physician: Jacqulynn Cadet  Patient Status: [facility] - Out-pt  History of Present Illness: Lauren Salazar is a 58 y.o. female   Newly diagnosed bladder cancer  10/31/18: 1. 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 Bladder, transurethral resection, deep posterior wall INFILTRATIVE HIGH GRADE SQUAMOUS CELL CARCINOMA THE CARCINOMA INVOLVING DETRUSOR MUSCLE 3. Bladder, transurethral resection, deep margin wall INFILTRATIVE HIGH GRADE SQUAMOUS CELL CARCINOMA  CT 7/30: IMPRESSION: 1. Right upper lobe lung nodule and right lower lobe lung mass are identified, concerning for neoplasm. Findings may reflect metachronous primary neoplasms versus metastatic disease. 2. Aortic Atherosclerosis (ICD10-I70.0) and Emphysema (ICD10-J43.9). 3. Bilateral, indeterminate adrenal nodules. More definitive characterization of these nodules may be obtained with adrenal protocol MRI or CT.   Scheduled now for RLL mass bippsy   Past Medical History:  Diagnosis Date  . Bladder tumor   . Complication of anesthesia    slow to wake after spinal fusion, then was "looney" for the following 24 hours   . Persistent dry cough    ongoing since feb 2020    Past Surgical History:  Procedure Laterality Date  . CERVICAL FUSION  2005   c5-c6     . TRANSURETHRAL RESECTION OF BLADDER TUMOR N/A 10/31/2018   Procedure: TRANSURETHRAL RESECTION OF BLADDER TUMOR (TURBT) WITH CYSTOSCOPY/ POSSIBLE INTRAVESICAL GEMCITABINE;  Surgeon: Ceasar Mons, MD;  Location: [facility];  Service: Urology;  Laterality: N/A;    Allergies: Patient has no known allergies.  Medications: Prior to  Admission medications   Medication Sig Start Date End Date Taking? Authorizing Provider  acetaminophen (TYLENOL) 500 MG tablet Take 500 mg by mouth every 8 (eight) hours as needed (pain).   Yes [provider]  buPROPion (WELLBUTRIN XL) 150 MG 24 hr tablet Take 150 mg by mouth daily.   Yes [provider]  HYDROcodone-acetaminophen (NORCO) 5-325 MG tablet Take 1 tablet by mouth every 4 (four) hours as needed for moderate pain. Patient not taking: Reported on 12/03/2018 10/31/18   Ceasar Mons, MD  oxybutynin (DITROPAN) 5 MG tablet Take 1 tablet (5 mg total) by mouth every 8 (eight) hours as needed for bladder spasms. Patient not taking: Reported on 12/03/2018 10/31/18   Ceasar Mons, MD  phenazopyridine (PYRIDIUM) 200 MG tablet Take 1 tablet (200 mg total) by mouth 3 (three) times daily as needed (for pain with urination). Patient not taking: Reported on 12/03/2018 10/31/18 10/31/19  Ceasar Mons, MD     History reviewed. No pertinent family history.  Social History   Socioeconomic History  . Marital status: Divorced    Spouse name: Not on file  . Number of children: Not on file  . Years of education: Not on file  . Highest education level: Not on file  Occupational History  . Not on file  Social Needs  . Financial resource strain: Not on file  . Food insecurity    Worry: Not on file    Inability: Not on file  . Transportation needs    Medical: Not on file    Non-medical: Not on file  Tobacco Use  . Smoking status: Current Every Day Smoker    Packs/day: 1.00    Types: Cigarettes  . Smokeless tobacco: Never Used  . Tobacco  comment: smoking since age 58   Substance and Sexual Activity  . Alcohol use: No  . Drug use: No  . Sexual activity: Not on file  Lifestyle  . Physical activity    Days per week: Not on file    Minutes per session: Not on file  . Stress: Not on file  Relationships  . Social Herbalist on phone:  Not on file    Gets together: Not on file    Attends religious service: Not on file    Active member of club or organization: Not on file    Attends meetings of clubs or organizations: Not on file    Relationship status: Not on file  Other Topics Concern  . Not on file  Social History Narrative  . Not on file    Review of Systems: A 12 point ROS discussed and pertinent positives are indicated in the HPI above.  All other systems are negative.  Review of Systems  Constitutional: Negative for activity change, appetite change, fatigue and fever.  Respiratory: Negative for cough and shortness of breath.   Cardiovascular: Negative for chest pain.  Gastrointestinal: Negative for abdominal pain.  Musculoskeletal: Negative for back pain.  Neurological: Negative for weakness.  Psychiatric/Behavioral: Negative for behavioral problems and confusion.    Vital Signs: BP (!) 144/94   Pulse 83   Resp 16   SpO2 97%   Physical Exam Vitals signs reviewed.  Cardiovascular:     Rate and Rhythm: Normal rate and regular rhythm.     Heart sounds: Normal heart sounds.  Pulmonary:     Breath sounds: Normal breath sounds.  Abdominal:     Palpations: Abdomen is soft.  Musculoskeletal: Normal range of motion.  Skin:    General: Skin is warm and dry.  Neurological:     Mental Status: She is alert and oriented to person, place, and time.  Psychiatric:        Mood and Affect: Mood normal.        Behavior: Behavior normal.        Thought Content: Thought content normal.        Judgment: Judgment normal.     Imaging: Ct Chest W Contrast  Result Date: 11/23/2018 CLINICAL DATA:  History of bladder tumor EXAM: CT CHEST WITH CONTRAST TECHNIQUE: Multidetector CT imaging of the chest was performed during intravenous contrast administration. CONTRAST:  101mL OMNIPAQUE IOHEXOL 300 MG/ML  SOLN COMPARISON:  None FINDINGS: Cardiovascular: Heart size is normal. No pleural or pericardial effusion identified.  Aortic atherosclerosis. Mediastinum/Nodes: Normal appearance of the thyroid gland. No enlarged axillary or supraclavicular lymph nodes. No enlarged mediastinal or hilar lymph nodes. Lungs/Pleura: Moderate emphysema. Right upper lobe spiculated nodule measures 1.4 cm, image 75/5. Posterior right lower lobe lung mass measures 4.5 cm, image 65/5. Upper Abdomen: No focal liver abnormality identified. Indeterminate right adrenal nodule measures the left adrenal nodule measures 2.5 cm and 29 HU. Musculoskeletal: No chest wall abnormality. No acute or significant osseous findings. IMPRESSION: 1. Right upper lobe lung nodule and right lower lobe lung mass are identified, concerning for neoplasm. Findings may reflect metachronous primary neoplasms versus metastatic disease. 2. Aortic Atherosclerosis (ICD10-I70.0) and Emphysema (ICD10-J43.9). 3. Bilateral, indeterminate adrenal nodules. More definitive characterization of these nodules may be obtained with adrenal protocol MRI or CT. Electronically Signed   By: Kerby Moors M.D.   On: 11/23/2018 08:56    Labs:  CBC: Recent Labs    10/29/18 1530  WBC 10.9*  HGB 13.1  HCT 42.0  PLT 310    COAGS: No results for input(s): INR, APTT in the last 8760 hours.  BMP: Recent Labs    10/18/18 1246 10/29/18 1530  NA  --  138  K  --  3.8  CL  --  102  CO2  --  28  GLUCOSE  --  137*  BUN  --  11  CALCIUM  --  8.8*  CREATININE 0.70 0.67  GFRNONAA  --  >60  GFRAA  --  >60    LIVER FUNCTION TESTS: No results for input(s): BILITOT, AST, ALT, ALKPHOS, PROT, ALBUMIN in the last 8760 hours.  TUMOR MARKERS: No results for input(s): AFPTM, CEA, CA199, CHROMGRNA in the last 8760 hours.  Assessment and Plan:  Newly dx Bladder Ca Work up revealing RLL mass Now scheduled for biopsy of same Risks and benefits of CT guided lung nodule biopsy was discussed with the patient including, but not limited to bleeding, hemoptysis, respiratory failure requiring  intubation, infection, pneumothorax requiring chest tube placement, stroke from air embolism or even death.  All of the patient's questions were answered and the patient is agreeable to proceed. Consent signed and in chart.   Thank you for this interesting consult.  I greatly enjoyed meeting SHANIRA TINE and look forward to participating in their care.  A copy of this report was sent to the requesting provider on this date.  Electronically Signed: Lavonia Drafts, PA-C 12/10/2018, 7:31 AM   I spent a total of  30 Minutes   in face to face in clinical consultation, greater than 50% of which was counseling/coordinating care for RLL mass bx

## 2018-12-10 NOTE — Discharge Instructions (Signed)
Needle Biopsy, Care After These instructions tell you how to care for yourself after your procedure. Your doctor may also give you more specific instructions. Call your doctor if you have any problems or questions. What can I expect after the procedure? After the procedure, it is common to have:  Soreness.  Bruising.  Mild pain. Follow these instructions at home:   Return to your normal activities as told by your doctor. Ask your doctor what activities are safe for you.  Take over-the-counter and prescription medicines only as told by your doctor.  Wash your hands with soap and water before you change your bandage (dressing). If you cannot use soap and water, use hand sanitizer.  Follow instructions from your doctor about: ? How to take care of your puncture site. ? When and how to change your bandage. ? When to remove your bandage.  Check your puncture site every day for signs of infection. Watch for: ? Redness, swelling, or pain. ? Fluid or blood. ? Pus or a bad smell. ? Warmth.  Do not take baths, swim, or use a hot tub until your doctor approves. Ask your doctor if you may take showers. You may only be allowed to take sponge baths.  Keep all follow-up visits as told by your doctor. This is important. Contact a doctor if you have:  A fever.  Redness, swelling, or pain at the puncture site, and it lasts longer than a few days.  Fluid, blood, or pus coming from the puncture site.  Warmth coming from the puncture site. Get help right away if:  You have a lot of bleeding from the puncture site. Summary  After the procedure, it is common to have soreness, bruising, or mild pain at the puncture site.  Check your puncture site every day for signs of infection, such as redness, swelling, or pain.  Get help right away if you have severe bleeding from your puncture site. This information is not intended to replace advice given to you by your health care provider. Make  sure you discuss any questions you have with your health care provider. Document Released: 03/24/2008 Document Revised: 04/24/2017 Document Reviewed: 04/24/2017 Elsevier Patient Education  2020 [employer].

## 2018-12-12 ENCOUNTER — Other Ambulatory Visit: Payer: Self-pay | Admitting: Urology

## 2018-12-14 ENCOUNTER — Telehealth: Payer: Self-pay | Admitting: *Deleted

## 2018-12-14 DIAGNOSIS — R918 Other nonspecific abnormal finding of lung field: Secondary | ICD-10-CM

## 2018-12-14 NOTE — Telephone Encounter (Signed)
Oncology Nurse Navigator Documentation  Oncology Nurse Navigator Flowsheets 12/14/2018  Diagnosis Status -  Navigator Follow Up Date: -  Navigator Follow Up Reason: -  Navigator Location [facility]  Referral Date to RadOnc/MedOnc -  Navigator Encounter Type Telephone/Patient called me to update me that her daughter has set her up to be seen at [facility] for care on Monday.  Patient would like to cancel appt here at the cancer center.  I said we are here to help if needed.   Telephone Outgoing Call  Abnormal Finding Date -  Confirmed Diagnosis Date -  Treatment Phase Pre-Tx/Tx Discussion  Barriers/Navigation Needs Coordination of Care;Education  Education Other  Interventions Coordination of Care;Education  Coordination of Care Other  Education Method Verbal  Acuity Level 1  Time Spent with Patient 15

## 2018-12-14 NOTE — Telephone Encounter (Signed)
Oncology Nurse Navigator Documentation  Oncology Nurse Navigator Flowsheets 12/14/2018  Diagnosis Status Confirmed Diagnosis Complete  Navigator Follow Up Date: 12/17/2018  Navigator Follow Up Reason: New [place] Appointment  Navigator Location [facility]  Referral Date to RadOnc/MedOnc 12/14/2018  Navigator Encounter Type Telephone/I received referral today on Lauren Salazar.  I called her to schedule an appt with Dr. Julien Nordmann.  She is confused about results of bx.  I asked that she call Dr. Gilford Rile office for an update. I will also reach out to them with an update.  Patient verbalized understanding of appt time and place.    Telephone Appt Confirmation/Clarification  Abnormal Finding Date 10/18/2018  Confirmed Diagnosis Date 12/10/2018  Treatment Phase Pre-Tx/Tx Discussion  Barriers/Navigation Needs Coordination of Care;Education  Education Other  Interventions Coordination of Care;Education  Coordination of Care Appts;Other  Education Method Verbal  Acuity Level 3  Time Spent with Patient 23

## 2018-12-17 ENCOUNTER — Inpatient Hospital Stay: Payer: Medicaid Other

## 2018-12-17 ENCOUNTER — Ambulatory Visit: Payer: Medicaid Other | Admitting: Internal Medicine

## 2018-12-21 ENCOUNTER — Telehealth: Payer: Self-pay | Admitting: *Deleted

## 2018-12-21 NOTE — Telephone Encounter (Signed)
Oncology Nurse Navigator Documentation  Oncology Nurse Navigator Flowsheets 12/21/2018  Diagnosis Status -  Navigator Follow Up Date: -  Navigator Follow Up Reason: -  Navigator Location [facility]  Referral Date to RadOnc/MedOnc -  Navigator Encounter Type Telephone/I received a message that Ms. Nikolic would like to be seen here.  I have spoken to her and the daughter and their plan was to go to [facility] for tx.  I called today to get clarification but was unable to reach anyone.  I did leave a vm message with my name and phone number to call.   Telephone Outgoing Call  Abnormal Finding Date -  Confirmed Diagnosis Date -  Treatment Phase Pre-Tx/Tx Discussion  Barriers/Navigation Needs Coordination of Care;Education  Education Other  Interventions Coordination of Care;Education  Coordination of Care Other  Education Method Verbal  Acuity Level 2  Time Spent with Patient 15

## 2018-12-24 ENCOUNTER — Telehealth: Payer: Self-pay | Admitting: *Deleted

## 2018-12-24 NOTE — Telephone Encounter (Signed)
Oncology Nurse Navigator Documentation  Oncology Nurse Navigator Flowsheets 12/24/2018  Diagnosis Status -  Navigator Follow Up Date: -  Navigator Follow Up Reason: -  Navigator Location [facility]  Referral Date to RadOnc/MedOnc -  Navigator Encounter Type Telephone/I reached out to patient to see if she wants to get treatment here.  She states yes but not with Dr. Julien Nordmann.  I will update new patient coordinator to call and schedule with another provider.   Telephone Outgoing Call  Abnormal Finding Date -  Confirmed Diagnosis Date -  Treatment Phase Pre-Tx/Tx Discussion  Barriers/Navigation Needs Coordination of Care;Education  Education Other  Interventions Coordination of Care;Education  Coordination of Care Other  Education Method Verbal  Acuity Level 2  Time Spent with Patient 30

## 2018-12-25 ENCOUNTER — Telehealth: Payer: Self-pay | Admitting: Oncology

## 2018-12-25 ENCOUNTER — Encounter: Payer: Self-pay | Admitting: Oncology

## 2018-12-25 NOTE — Telephone Encounter (Signed)
Received a new patient referral from Dr. Lovena Neighbours for bladder and lung cancer. Lauren Salazar has been cld and scheduled to see Dr. Alen Blew on 9/15 at 2pm. She's aware to arrive 15 minutes early. Letter mailed per pt's request.

## 2019-01-08 ENCOUNTER — Other Ambulatory Visit: Payer: Self-pay

## 2019-01-08 ENCOUNTER — Telehealth: Payer: Self-pay | Admitting: *Deleted

## 2019-01-08 ENCOUNTER — Inpatient Hospital Stay: Payer: MEDICAID | Attending: Oncology | Admitting: Oncology

## 2019-01-08 DIAGNOSIS — R911 Solitary pulmonary nodule: Secondary | ICD-10-CM

## 2019-01-08 NOTE — Progress Notes (Signed)
Reason for the request: Bladder and lung cancer  HPI: I was asked by Dr. Lovena Neighbours to evaluate [name] for the new diagnosis of bladder cancer and lung mass.  She is 58 year old woman without any significant comorbid conditions started developing symptoms of hematuria back in March of this year.  She was treated initially with antibiotics and subsequently referred to Dr. Lovena Neighbours.  Upon his evaluation, she underwent a CT scan of the abdomen and pelvis on 10/18/2018.  The scan showed a large enhancing mass involving the dome of the bladder with possible involvement of the uterus and surrounding pelvic fat.  No abdominal adenopathy noted at this time.  There is also a suspicious mass within the right lower lobe of the lung at that time.  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  There is also involvement in the deep wall of the bladder of the same tumor.  She subsequently had a CT scan of the chest which showed a right lower lobe lung mass measuring 4.5 cm.  There is also a right upper lobe spiculated nodules measuring 1.4 mm.  Needle biopsy obtained on 12/10/2018 which showed moderately differentiated adenocarcinoma.  Immunohistochemical stain showed TTF-1 positive which is consistent of primary lung adenocarcinoma.  She was evaluated by Dr. Tresa Moore and scheduled to have a radical cystectomy in October 2020.  Clinically, she reports no complaints at this time.  He denies any respiratory issues including cough wheezing but she continues to smoke rather heavily.  She has cut down some to half a pack a day but otherwise no other complaints.  She denies any frequency urgency or hesitancy.  She denies any respiratory complaints.   She does not report any headaches, blurry vision, syncope or seizures. Does not report  any fevers, chills or sweats.  Does not report any cough, wheezing or hemoptysis.  Does not report any chest pain, palpitation, orthopnea or leg edema.  Does not report any nausea, vomiting or abdominal pain.  Does not report any constipation or diarrhea.  Does not report any skeletal complaints.    Does not report frequency, urgency or hematuria.  Does not report any skin rashes or lesions. Does not report any heat or cold intolerance.  Does not report any lymphadenopathy or petechiae.  Does not report any anxiety or depression.  Remaining review of systems is negative.    Past Medical History:  Diagnosis Date  . Bladder tumor   . Complication of anesthesia    slow to wake after spinal fusion, then was "looney" for the following 24 hours   . Persistent dry cough    ongoing since feb 2020  :  Past Surgical History:  Procedure Laterality Date  . CERVICAL FUSION  2005   c5-c6     . TRANSURETHRAL RESECTION OF BLADDER TUMOR N/A 10/31/2018   Procedure: TRANSURETHRAL RESECTION OF BLADDER TUMOR (TURBT) WITH CYSTOSCOPY/ POSSIBLE INTRAVESICAL GEMCITABINE;  Surgeon: Ceasar Mons, MD;  Location: [facility];  Service: Urology;  Laterality: N/A;  :   Current Outpatient Medications:  .  acetaminophen (TYLENOL) 500 MG tablet, Take 500 mg by mouth every 8 (eight) hours as needed (pain)., Disp: , Rfl:  .  buPROPion (WELLBUTRIN XL) 150 MG 24 hr tablet, Take 150 mg by mouth daily., Disp: , Rfl:  .  HYDROcodone-acetaminophen (NORCO) 5-325 MG tablet,  Take 1 tablet by mouth every 4 (four) hours as needed for moderate pain. (Patient not taking: Reported on 12/03/2018), Disp: 20 tablet, Rfl: 0 .  oxybutynin (DITROPAN) 5 MG tablet, Take 1 tablet (5 mg total) by mouth every 8 (eight) hours as needed for bladder spasms. (Patient not taking: Reported on 12/03/2018), Disp: 30 tablet, Rfl: 1 .  phenazopyridine (PYRIDIUM) 200 MG tablet, Take 1 tablet (200 mg total) by mouth 3 (three) times daily as needed (for pain  with urination). (Patient not taking: Reported on 12/03/2018), Disp: 30 tablet, Rfl: 0:  No Known Allergies:  No family history on file.:  Social History   Socioeconomic History  . Marital status: Divorced    Spouse name: Not on file  . Number of children: Not on file  . Years of education: Not on file  . Highest education level: Not on file  Occupational History  . Not on file  Social Needs  . Financial resource strain: Not on file  . Food insecurity    Worry: Not on file    Inability: Not on file  . Transportation needs    Medical: Not on file    Non-medical: Not on file  Tobacco Use  . Smoking status: Current Every Day Smoker    Packs/day: 1.00    Types: Cigarettes  . Smokeless tobacco: Never Used  . Tobacco comment: smoking since age 17   Substance and Sexual Activity  . Alcohol use: No  . Drug use: No  . Sexual activity: Not on file  Lifestyle  . Physical activity    Days per week: Not on file    Minutes per session: Not on file  . Stress: Not on file  Relationships  . Social Herbalist on phone: Not on file    Gets together: Not on file    Attends religious service: Not on file    Active member of club or organization: Not on file    Attends meetings of clubs or organizations: Not on file    Relationship status: Not on file  . Intimate partner violence    Fear of current or ex partner: Not on file    Emotionally abused: Not on file    Physically abused: Not on file    Forced sexual activity: Not on file  Other Topics Concern  . Not on file  Social History Narrative  . Not on file  :  Pertinent items are noted in HPI.  Exam: Blood pressure 131/84, pulse 84, temperature 98.5 F (36.9 C), temperature source Oral, resp. rate 18, height 5\' 6"  (1.676 m), weight 150 lb 11.2 oz (68.4 kg), SpO2 99 %.  ECOG 0 General appearance: alert and cooperative appeared without distress. Head: atraumatic without any abnormalities. Eyes: conjunctivae/corneas  clear. PERRL.  Sclera anicteric. Throat: lips, mucosa, and tongue normal; without oral thrush or ulcers. Resp: clear to auscultation bilaterally without rhonchi, wheezes or dullness to percussion. Cardio: regular rate and rhythm, S1, S2 normal, no murmur, click, rub or gallop GI: soft, non-tender; bowel sounds normal; no masses,  no organomegaly Skin: Skin color, texture, turgor normal. No rashes or lesions Lymph nodes: Cervical, supraclavicular, and axillary nodes normal. Neurologic: Grossly normal without any motor, sensory or deep tendon reflexes. Musculoskeletal: No joint deformity or effusion.  CBC    Component Value Date/Time   WBC 8.4 12/10/2018 0718   RBC 4.64 12/10/2018 0718   HGB 13.8 12/10/2018 0718   HCT 43.7 12/10/2018 0718  PLT 229 12/10/2018 0718   MCV 94.2 12/10/2018 0718   MCH 29.7 12/10/2018 0718   MCHC 31.6 12/10/2018 0718   RDW 14.1 12/10/2018 0718     Chemistry      Component Value Date/Time   NA 138 10/29/2018 1530   K 3.8 10/29/2018 1530   CL 102 10/29/2018 1530   CO2 28 10/29/2018 1530   BUN 11 10/29/2018 1530   CREATININE 0.67 10/29/2018 1530      Component Value Date/Time   CALCIUM 8.8 (L) 10/29/2018 1530         Ct Biopsy  Result Date: 12/10/2018 INDICATION: 58 year old female with a history of bladder cancer. She has a new right lower lobe pulmonary mass concerning for either bladder cancer metastasis versus a new primary lung carcinoma. She presents for CT-guided biopsy to facilitate tissue diagnosis. EXAM: CT-guided biopsy right lower lobe pulmonary nodule Interventional Radiologist:  Criselda Peaches, MD MEDICATIONS: None. ANESTHESIA/SEDATION: Fentanyl 100 mcg IV; Versed 2 mg IV Moderate Sedation Time:  20 minutes The patient was continuously monitored during the procedure by the interventional radiology nurse under my direct supervision. FLUOROSCOPY TIME:  None. COMPLICATIONS: None immediate. Estimated blood loss:  0 PROCEDURE: Informed  written consent was obtained from the patient after a thorough discussion of the procedural risks, benefits and alternatives. All questions were addressed. Maximal Sterile Barrier Technique was utilized including caps, mask, sterile gowns, sterile gloves, sterile drape, hand hygiene and skin antiseptic. A timeout was performed prior to the initiation of the procedure. A planning axial CT scan was performed. The nodule in the right lower lobe was successfully identified. A suitable skin entry site was selected and marked. The region was then sterilely prepped and draped in standard fashion using Betadine skin prep. Local anesthesia was attained by infiltration with 1% lidocaine. A small dermatotomy was made. Under intermittent CT fluoroscopic guidance, a 17 gauge trocar needle was advanced into the lung and positioned at the margin of the nodule. Multiple 18 gauge core biopsies were then coaxially obtained using the BioPince automated biopsy device. Biopsy specimens were placed in formalin and delivered to pathology for further analysis. A bio sentry device was deployed. Post biopsy axial CT imaging demonstrates no evidence of immediate complication. There is no pneumothorax. Mild perilesional alveolar hemorrhage is not unexpected. The patient tolerated the procedure well. IMPRESSION: Technically successful CT-guided biopsy right lower lobe pulmonary nodule. Electronically Signed   By: Jacqulynn Cadet M.D.   On: 12/10/2018 10:33   Dg Chest Port 1 View  Result Date: 12/10/2018 CLINICAL DATA:  Patient status post biopsy of a right lower lobe mass today. EXAM: PORTABLE CHEST 1 VIEW COMPARISON:  CT chest 11/22/2018. FINDINGS: No pneumothorax after biopsy is identified. Right lower lobe mass is again seen. Lungs are emphysematous. Heart size is normal. No pleural fluid. No acute bony abnormality. IMPRESSION: Negative for pneumothorax after biopsy of a right lower lobe mass. Emphysema. Electronically Signed   By:  Inge Rise M.D.   On: 12/10/2018 11:16    Assessment and Plan:    58 year old woman with:  1.  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  She presented with hematuria and found to have a mass measuring between 4 to 5 cm that is biopsy-proven to be squamous cell carcinoma.  He is under evaluation to undergo radical cystectomy which is scheduled in early part of October.  The natural course of this disease and treatment options were reviewed.  The role of  neoadjuvant and adjuvant chemotherapy was reiterated at this time.  Given the variant histology I agree with Dr. Tresa Moore with proceeding with radical cystectomy upfront and potentially consider the role of adjuvant chemotherapy especially in the setting of a lung neoplasm to be done adjuvantly.  Upon completing her surgery she will return to discuss further treatment options regarding her bladder cancer.  2.  Adenocarcinoma of the lung.  She presented with an incidental right lower lobe mass measuring 4.5 cm with an upper lobe nodule measuring 1.5 cm that is also suspicious.  This is biopsy-proven to be primary lung malignancy.  The natural course of this disease and management options were reviewed.  First step is to complete the staging to fully evaluate the extent of her disease especially in the setting of a second lung primary in addition to her bladder cancer.  Management options that include primary surgical therapy, radiation therapy, neoadjuvant chemotherapy followed by surgical resection among other options were reviewed.  At this time we will obtain a PET CT scan and schedule her for thoracic oncology tumor conference for an evaluation regarding treatment options.  Definitive treatment for her lung neoplasm might need to wait till after her bladder surgery given the aggressive nature of her bladder cancer at this time.  3.  Follow-up: We will be in the next few weeks to follow her progress.  60  minutes was  spent with the patient face-to-face today.  More than 50% of time was spent on reviewing her disease status, reviewing imaging studies and management options of both malignancies.     Thank you for the referral.  A copy of this consult has been forwarded to the requesting physician.

## 2019-01-08 NOTE — Telephone Encounter (Signed)
Oncology Nurse Navigator Documentation  Oncology Nurse Navigator Flowsheets 01/08/2019  Diagnosis Status -  Navigator Follow Up Date: -  Navigator Follow Up Reason: -  Navigator Location [facility]  Referral Date to RadOnc/MedOnc -  Navigator Encounter Type Telephone;Other/I received a message from Dr. Alen Blew that patient is not insured and needs help paying for PET scan.  I contacted Mining engineer and radiology Database administrator.  I then called the patient with an update.  She needs to apply for Medicaid and call [phone] to set up payment options.  I called patient to update.  She has already applied for Medicaid and disability.  Patient will call the radiology number above to set up payment options.  I then contact radiology to see if Ms. Wander can get her PET scan scheduled.  Will wait to hear from them.   Telephone Outgoing Call  Abnormal Finding Date -  Confirmed Diagnosis Date -  Treatment Phase Pre-Tx/Tx Discussion  Barriers/Navigation Needs Coordination of Care;Education;Financial  Education Other  Interventions Coordination of Care;Education;Other  Coordination of Care Other  Education Method Verbal  Acuity Level 4  Time Spent with Patient 14

## 2019-01-09 ENCOUNTER — Telehealth: Payer: Self-pay | Admitting: *Deleted

## 2019-01-09 NOTE — Telephone Encounter (Signed)
Oncology Nurse Navigator Documentation  Oncology Nurse Navigator Flowsheets 01/09/2019  Diagnosis Status -  Navigator Follow Up Date: -  Navigator Follow Up Reason: -  Navigator Location [facility]  Referral Date to RadOnc/MedOnc -  Navigator Encounter Type Telephone/I worked with radiology team to get PET san scheduled.  I called central scheduling and was given a date and time.  I called to update her on time, date, location, and pre-procedure instructions.  She verbalized understanding.  I also gave her the phone number to central scheduling if she wants to change the time, due to her having trouble in the am.    Telephone Outgoing Call  Abnormal Finding Date -  Confirmed Diagnosis Date -  Treatment Phase Pre-Tx/Tx Discussion  Barriers/Navigation Needs Coordination of Care;Education;Family Concerns;Health Literacy;Healthcare System Knowledge Deficit;No Insurance;Unemployed  Education Other  Interventions Coordination of Care;Education;Other  Coordination of Care Appts;Other  Education Method Verbal  Acuity Level 3-Moderate Needs (3-4 Barriers Identified)  Time Spent with Patient 45

## 2019-01-16 ENCOUNTER — Ambulatory Visit (HOSPITAL_COMMUNITY)
Admission: RE | Admit: 2019-01-16 | Discharge: 2019-01-16 | Disposition: A | Discharge status: Home / Self Care | Payer: Medicaid Other | Source: Ambulatory Visit | Attending: Oncology | Admitting: Oncology

## 2019-01-16 ENCOUNTER — Other Ambulatory Visit: Payer: Self-pay

## 2019-01-16 DIAGNOSIS — R911 Solitary pulmonary nodule: Secondary | ICD-10-CM | POA: Diagnosis present

## 2019-01-16 LAB — GLUCOSE, CAPILLARY: Glucose-Capillary: 117 mg/dL — ABNORMAL HIGH (ref 70–99)

## 2019-01-16 MED ORDER — FLUDEOXYGLUCOSE F - 18 (FDG) INJECTION
7.3700 | Freq: Once | INTRAVENOUS | Status: AC | PRN
  Administered 2019-01-16: 7.37 via INTRAVENOUS

## 2019-01-17 ENCOUNTER — Other Ambulatory Visit: Payer: Self-pay | Admitting: *Deleted

## 2019-01-17 ENCOUNTER — Other Ambulatory Visit: Payer: Self-pay | Admitting: Oncology

## 2019-01-17 NOTE — Progress Notes (Signed)
The results of the PET scan discussed today with the patient via phone.  Her case was reviewed in the thoracic oncology tumor board.  At this time it was felt that she has a stage IIIa disease based on the PET scan criteria and the biopsy.  The plan is to treat her with definitive radiation with chemotherapy after her surgery.  We will arrange a follow-up upon her completing her bladder operation to schedule her lung cancer treatment.

## 2019-01-17 NOTE — Progress Notes (Signed)
The proposed treatment discussed in cancer conference 01/17/19 is for discussion purpose only and is not a binding recommendation.  The patient was not physically examined nor present for their treatment options.  Therefore, final treatment plans cannot be decided.   Dr. Alen Blew updated on recommendations.

## 2019-01-18 ENCOUNTER — Telehealth: Payer: Self-pay | Admitting: *Deleted

## 2019-01-18 NOTE — Telephone Encounter (Signed)
Records faxed to [facility] - Release [id number]

## 2019-01-23 ENCOUNTER — Telehealth: Payer: Self-pay | Admitting: Oncology

## 2019-01-23 NOTE — Telephone Encounter (Signed)
Called and spoke with patient. Confirmed appt  °

## 2019-01-30 ENCOUNTER — Encounter (HOSPITAL_COMMUNITY): Payer: Self-pay | Admitting: Internal Medicine

## 2019-01-31 ENCOUNTER — Inpatient Hospital Stay (HOSPITAL_COMMUNITY)
Admission: RE | Admit: 2019-01-31 | Discharge: 2019-02-07 | DRG: 654 | Disposition: A | Discharge status: Home Health | Payer: Medicaid Other | Attending: Urology | Admitting: Urology

## 2019-01-31 ENCOUNTER — Other Ambulatory Visit: Payer: Self-pay | Admitting: Urology

## 2019-01-31 ENCOUNTER — Encounter (HOSPITAL_COMMUNITY): Payer: Self-pay | Admitting: *Deleted

## 2019-01-31 ENCOUNTER — Other Ambulatory Visit: Payer: Self-pay

## 2019-01-31 DIAGNOSIS — C3491 Malignant neoplasm of unspecified part of right bronchus or lung: Secondary | ICD-10-CM | POA: Diagnosis present

## 2019-01-31 DIAGNOSIS — Z906 Acquired absence of other parts of urinary tract: Secondary | ICD-10-CM

## 2019-01-31 DIAGNOSIS — Z20828 Contact with and (suspected) exposure to other viral communicable diseases: Secondary | ICD-10-CM | POA: Diagnosis present

## 2019-01-31 DIAGNOSIS — Z981 Arthrodesis status: Secondary | ICD-10-CM

## 2019-01-31 DIAGNOSIS — J449 Chronic obstructive pulmonary disease, unspecified: Secondary | ICD-10-CM | POA: Diagnosis present

## 2019-01-31 DIAGNOSIS — K660 Peritoneal adhesions (postprocedural) (postinfection): Secondary | ICD-10-CM | POA: Diagnosis not present

## 2019-01-31 DIAGNOSIS — C679 Malignant neoplasm of bladder, unspecified: Principal | ICD-10-CM | POA: Diagnosis present

## 2019-01-31 DIAGNOSIS — F17210 Nicotine dependence, cigarettes, uncomplicated: Secondary | ICD-10-CM | POA: Diagnosis present

## 2019-01-31 LAB — COMPREHENSIVE METABOLIC PANEL
ALT: 12 U/L (ref 0–44)
AST: 14 U/L — ABNORMAL LOW (ref 15–41)
Albumin: 3.7 g/dL (ref 3.5–5.0)
Alkaline Phosphatase: 73 U/L (ref 38–126)
Anion gap: 9 (ref 5–15)
BUN: 12 mg/dL (ref 6–20)
CO2: 27 mmol/L (ref 22–32)
Calcium: 8.8 mg/dL — ABNORMAL LOW (ref 8.9–10.3)
Chloride: 105 mmol/L (ref 98–111)
Creatinine, Ser: 0.79 mg/dL (ref 0.44–1.00)
GFR calc Af Amer: >60 mL/min (ref >60)
GFR calc non Af Amer: >60 mL/min (ref >60)
Glucose, Bld: 100 mg/dL — ABNORMAL HIGH (ref 70–99)
Potassium: 3.9 mmol/L (ref 3.5–5.1)
Sodium: 141 mmol/L (ref 135–145)
Total Bilirubin: 0.1 mg/dL — ABNORMAL LOW (ref 0.3–1.2)
Total Protein: 6.9 g/dL (ref 6.5–8.1)

## 2019-01-31 LAB — CBC
HCT: 42.6 % (ref 36.0–46.0)
Hemoglobin: 13.5 g/dL (ref 12.0–15.0)
MCH: 29.5 pg (ref 26.0–34.0)
MCHC: 31.7 g/dL (ref 30.0–36.0)
MCV: 93 fL (ref 80.0–100.0)
Platelets: 254 10*3/uL (ref 150–400)
RBC: 4.58 MIL/uL (ref 3.87–5.11)
RDW: 13.2 % (ref 11.5–15.5)
WBC: 9.4 10*3/uL (ref 4.0–10.5)
nRBC: 0 % (ref 0.0–0.2)

## 2019-01-31 LAB — SURGICAL PCR SCREEN
MRSA, PCR: NEGATIVE
Staphylococcus aureus: NEGATIVE

## 2019-01-31 LAB — SARS CORONAVIRUS 2 BY RT PCR (HOSPITAL ORDER, PERFORMED IN ~~LOC~~ HOSPITAL LAB): SARS Coronavirus 2: NEGATIVE

## 2019-01-31 MED ORDER — NEOMYCIN SULFATE 500 MG PO TABS
500.0000 mg | ORAL_TABLET | ORAL | Status: AC
  Administered 2019-01-31 – 2019-02-01 (×2): 500 mg via ORAL
  Filled 2019-01-31 (×2): qty 1

## 2019-01-31 MED ORDER — ACETAMINOPHEN 325 MG PO TABS
650.0000 mg | ORAL_TABLET | Freq: Once | ORAL | Status: AC
  Administered 2019-01-31: 650 mg via ORAL
  Filled 2019-01-31: qty 2

## 2019-01-31 MED ORDER — NICOTINE 21 MG/24HR TD PT24
21.0000 mg | MEDICATED_PATCH | Freq: Every day | TRANSDERMAL | Status: DC
  Administered 2019-02-02: 21 mg via TRANSDERMAL
  Filled 2019-01-31 (×5): qty 1

## 2019-01-31 MED ORDER — MUPIROCIN 2 % EX OINT
1.0000 "application " | TOPICAL_OINTMENT | Freq: Two times a day (BID) | CUTANEOUS | Status: DC
  Administered 2019-01-31: 1 via NASAL
  Filled 2019-01-31: qty 22

## 2019-01-31 MED ORDER — PEG 3350-KCL-NA BICARB-NACL 420 G PO SOLR
4000.0000 mL | Freq: Once | ORAL | Status: AC
  Administered 2019-01-31: 17:00:00 4000 mL via ORAL

## 2019-01-31 MED ORDER — METRONIDAZOLE 500 MG PO TABS
500.0000 mg | ORAL_TABLET | ORAL | Status: AC
  Administered 2019-01-31 – 2019-02-01 (×2): 500 mg via ORAL
  Filled 2019-01-31 (×2): qty 1

## 2019-01-31 MED ORDER — PIPERACILLIN-TAZOBACTAM 3.375 G IVPB 30 MIN
3.3750 g | INTRAVENOUS | Status: AC
  Administered 2019-02-01: 3.375 g via INTRAVENOUS
  Filled 2019-01-31: qty 50

## 2019-01-31 NOTE — Consult Note (Addendum)
[facility] Nurse requested for preoperative stoma site marking  Discussed surgical procedure and stoma creation with patient.  Explained role of the [facility] nurse team.  Provided the patient with educational booklet and provided samples of pouching options.  Answered patient's questions.   Examined patient lying, sitting, and standing in order to place the marking in the patient's visual field, away from any creases or abdominal contour issues and within the rectus muscle.  Attempted to mark below the patient's belt line, but this was not possible since there is a significant crease located lower which occurs when the patient leans forward and should be avoided if possible .   Marked for ileal conduit in the RLQ ___8_  cm to the right of the umbilicus and  _2___ cm above the umbilicus.  Patient's abdomen cleansed with CHG wipes at site markings, allowed to air dry prior to marking.  [facility] Nurse team will follow up with patient after surgery for continued ostomy care and teaching.   Julien Girt MSN, RN, [place], [place], [place]

## 2019-01-31 NOTE — Anesthesia Preprocedure Evaluation (Addendum)
Anesthesia Evaluation  Patient identified by MRN, date of birth, ID band Patient awake    Reviewed: Allergy & Precautions, H&P , NPO status , Patient's Chart, lab work & pertinent test results  Airway Mallampati: II  TM Distance: >3 FB Neck ROM: Full    Dental no notable dental hx. (+) Teeth Intact, Dental Advisory Given   Pulmonary neg pulmonary ROS, Current Smoker and Patient abstained from smoking.,    Pulmonary exam normal breath sounds clear to auscultation       Cardiovascular Exercise Tolerance: Good negative cardio ROS   Rhythm:Regular Rate:Normal     Neuro/Psych negative neurological ROS  negative psych ROS   GI/Hepatic negative GI ROS, Neg liver ROS,   Endo/Other  negative endocrine ROS  Renal/GU negative Renal ROS  negative genitourinary   Musculoskeletal   Abdominal   Peds  Hematology negative hematology ROS (+)   Anesthesia Other Findings   Reproductive/Obstetrics negative OB ROS                           Anesthesia Physical Anesthesia Plan  ASA: II  Anesthesia Plan: General   Post-op Pain Management:    Induction: Intravenous  PONV Risk Score and Plan: 3 and Ondansetron, Dexamethasone and Midazolam  Airway Management Planned: Oral ETT  Additional Equipment:   Intra-op Plan:   Post-operative Plan: Extubation in OR  Informed Consent: I have reviewed the patients History and Physical, chart, labs and discussed the procedure including the risks, benefits and alternatives for the proposed anesthesia with the patient or authorized representative who has indicated his/her understanding and acceptance.     Dental advisory given  Plan Discussed with: CRNA  Anesthesia Plan Comments:         Anesthesia Quick Evaluation

## 2019-02-01 ENCOUNTER — Encounter (HOSPITAL_COMMUNITY): Payer: Self-pay | Admitting: Emergency Medicine

## 2019-02-01 ENCOUNTER — Inpatient Hospital Stay (HOSPITAL_COMMUNITY): Payer: Medicaid Other | Admitting: Anesthesiology

## 2019-02-01 ENCOUNTER — Encounter (HOSPITAL_COMMUNITY)
Admission: RE | Disposition: A | Discharge status: Home Health | Payer: Self-pay | Source: Home / Self Care | Attending: Urology

## 2019-02-01 DIAGNOSIS — C679 Malignant neoplasm of bladder, unspecified: Secondary | ICD-10-CM | POA: Diagnosis present

## 2019-02-01 HISTORY — PX: ROBOT ASSISTED LAPAROSCOPIC COMPLETE CYSTECT ILEAL CONDUIT: SHX5139

## 2019-02-01 HISTORY — PX: CYSTOSCOPY WITH INJECTION: SHX1424

## 2019-02-01 HISTORY — PX: LYMPHADENECTOMY: SHX5960

## 2019-02-01 LAB — HEMOGLOBIN AND HEMATOCRIT, BLOOD
HCT: 44.1 % (ref 36.0–46.0)
Hemoglobin: 13.3 g/dL (ref 12.0–15.0)

## 2019-02-01 SURGERY — CYSTECTOMY, ROBOT-ASSISTED, WITH ILEAL CONDUIT CREATION
Anesthesia: General

## 2019-02-01 MED ORDER — ALVIMOPAN 12 MG PO CAPS
12.0000 mg | ORAL_CAPSULE | ORAL | Status: AC
  Administered 2019-02-01: 12 mg via ORAL
  Filled 2019-02-01: qty 1

## 2019-02-01 MED ORDER — HYDROMORPHONE HCL 1 MG/ML IJ SOLN
0.5000 mg | INTRAMUSCULAR | Status: DC | PRN
  Administered 2019-02-01: 0.5 mg via INTRAVENOUS
  Administered 2019-02-01 (×2): 1 mg via INTRAVENOUS
  Administered 2019-02-01: 0.5 mg via INTRAVENOUS
  Administered 2019-02-02: 1 mg via INTRAVENOUS
  Administered 2019-02-02: 0.5 mg via INTRAVENOUS
  Administered 2019-02-03 – 2019-02-04 (×5): 1 mg via INTRAVENOUS
  Administered 2019-02-04: 0.5 mg via INTRAVENOUS
  Administered 2019-02-04: 1 mg via INTRAVENOUS
  Administered 2019-02-04 – 2019-02-05 (×3): 0.5 mg via INTRAVENOUS
  Administered 2019-02-05 (×2): 1 mg via INTRAVENOUS
  Filled 2019-02-01 (×19): qty 1

## 2019-02-01 MED ORDER — PROPOFOL 10 MG/ML IV BOLUS
INTRAVENOUS | Status: AC
  Filled 2019-02-01: qty 20

## 2019-02-01 MED ORDER — ROCURONIUM BROMIDE 10 MG/ML (PF) SYRINGE
PREFILLED_SYRINGE | INTRAVENOUS | Status: DC | PRN
  Administered 2019-02-01: 10 mg via INTRAVENOUS
  Administered 2019-02-01: 20 mg via INTRAVENOUS
  Administered 2019-02-01: 100 mg via INTRAVENOUS
  Administered 2019-02-01: 20 mg via INTRAVENOUS

## 2019-02-01 MED ORDER — WATER FOR IRRIGATION, STERILE IR SOLN
Status: DC | PRN
  Administered 2019-02-01: 3000 mL

## 2019-02-01 MED ORDER — EPHEDRINE 5 MG/ML INJ
INTRAVENOUS | Status: AC
  Filled 2019-02-01: qty 10

## 2019-02-01 MED ORDER — DOCUSATE SODIUM 100 MG PO CAPS
100.0000 mg | ORAL_CAPSULE | Freq: Two times a day (BID) | ORAL | Status: DC
  Administered 2019-02-02 – 2019-02-07 (×9): 100 mg via ORAL
  Filled 2019-02-01 (×11): qty 1

## 2019-02-01 MED ORDER — FENTANYL CITRATE (PF) 250 MCG/5ML IJ SOLN
INTRAMUSCULAR | Status: AC
  Filled 2019-02-01: qty 5

## 2019-02-01 MED ORDER — DIPHENHYDRAMINE HCL 50 MG/ML IJ SOLN: 12.5000 mg | Freq: Four times a day (QID) | INTRAMUSCULAR | Status: DC | PRN

## 2019-02-01 MED ORDER — HYDROMORPHONE HCL 1 MG/ML IJ SOLN
INTRAMUSCULAR | Status: AC
  Filled 2019-02-01: qty 1

## 2019-02-01 MED ORDER — SODIUM CHLORIDE (PF) 0.9 % IJ SOLN
INTRAMUSCULAR | Status: DC | PRN
  Administered 2019-02-01: 20 mL

## 2019-02-01 MED ORDER — SUGAMMADEX SODIUM 200 MG/2ML IV SOLN
INTRAVENOUS | Status: DC | PRN
  Administered 2019-02-01: 200 mg via INTRAVENOUS

## 2019-02-01 MED ORDER — EPHEDRINE SULFATE 50 MG/ML IJ SOLN
INTRAMUSCULAR | Status: DC | PRN
  Administered 2019-02-01: 5 mg via INTRAVENOUS

## 2019-02-01 MED ORDER — ACETAMINOPHEN 500 MG PO TABS
1000.0000 mg | ORAL_TABLET | Freq: Once | ORAL | Status: AC
  Administered 2019-02-01: 1000 mg via ORAL
  Filled 2019-02-01: qty 2

## 2019-02-01 MED ORDER — DIPHENHYDRAMINE HCL 12.5 MG/5ML PO ELIX: 12.5000 mg | ORAL_SOLUTION | Freq: Four times a day (QID) | ORAL | Status: DC | PRN

## 2019-02-01 MED ORDER — ONDANSETRON HCL 4 MG/2ML IJ SOLN
4.0000 mg | INTRAMUSCULAR | Status: DC | PRN
  Administered 2019-02-01 – 2019-02-04 (×9): 4 mg via INTRAVENOUS
  Filled 2019-02-01 (×9): qty 2

## 2019-02-01 MED ORDER — ONDANSETRON HCL 4 MG/2ML IJ SOLN
INTRAMUSCULAR | Status: DC | PRN
  Administered 2019-02-01: 4 mg via INTRAVENOUS

## 2019-02-01 MED ORDER — FENTANYL CITRATE (PF) 250 MCG/5ML IJ SOLN
INTRAMUSCULAR | Status: DC | PRN
  Administered 2019-02-01: 25 ug via INTRAVENOUS
  Administered 2019-02-01 (×2): 50 ug via INTRAVENOUS
  Administered 2019-02-01: 100 ug via INTRAVENOUS
  Administered 2019-02-01 (×4): 50 ug via INTRAVENOUS
  Administered 2019-02-01: 25 ug via INTRAVENOUS
  Administered 2019-02-01: 50 ug via INTRAVENOUS

## 2019-02-01 MED ORDER — DEXTROSE-NACL 5-0.45 % IV SOLN
INTRAVENOUS | Status: DC
  Administered 2019-02-01 – 2019-02-02 (×2): via INTRAVENOUS
  Administered 2019-02-02: 1000 mL via INTRAVENOUS
  Administered 2019-02-03 – 2019-02-05 (×6): via INTRAVENOUS

## 2019-02-01 MED ORDER — PROPOFOL 10 MG/ML IV BOLUS
INTRAVENOUS | Status: DC | PRN
  Administered 2019-02-01: 140 mg via INTRAVENOUS

## 2019-02-01 MED ORDER — CHLORHEXIDINE GLUCONATE CLOTH 2 % EX PADS
6.0000 | MEDICATED_PAD | Freq: Every day | CUTANEOUS | Status: DC
  Administered 2019-02-02 – 2019-02-04 (×2): 6 via TOPICAL

## 2019-02-01 MED ORDER — KETAMINE HCL 10 MG/ML IJ SOLN
INTRAMUSCULAR | Status: DC | PRN
  Administered 2019-02-01 (×3): 10 mg via INTRAVENOUS
  Administered 2019-02-01: 40 mg via INTRAVENOUS

## 2019-02-01 MED ORDER — SODIUM CHLORIDE 0.9 % IV SOLN
INTRAVENOUS | Status: DC | PRN
  Administered 2019-02-01: 07:00:00 via INTRAVENOUS

## 2019-02-01 MED ORDER — HYDROMORPHONE HCL 1 MG/ML IJ SOLN
0.2500 mg | INTRAMUSCULAR | Status: DC | PRN
  Administered 2019-02-01 (×4): 0.5 mg via INTRAVENOUS

## 2019-02-01 MED ORDER — STERILE WATER FOR INJECTION IJ SOLN
INTRAMUSCULAR | Status: AC
  Filled 2019-02-01: qty 20

## 2019-02-01 MED ORDER — DEXAMETHASONE SODIUM PHOSPHATE 10 MG/ML IJ SOLN
INTRAMUSCULAR | Status: DC | PRN
  Administered 2019-02-01: 10 mg via INTRAVENOUS

## 2019-02-01 MED ORDER — SODIUM CHLORIDE (PF) 0.9 % IJ SOLN
INTRAMUSCULAR | Status: AC
  Filled 2019-02-01: qty 20

## 2019-02-01 MED ORDER — ORAL CARE MOUTH RINSE
15.0000 mL | Freq: Two times a day (BID) | OROMUCOSAL | Status: DC
  Administered 2019-02-01 – 2019-02-07 (×11): 15 mL via OROMUCOSAL

## 2019-02-01 MED ORDER — LIDOCAINE 20MG/ML (2%) 15 ML SYRINGE OPTIME
INTRAMUSCULAR | Status: DC | PRN
  Administered 2019-02-01: 1 mg/kg/h via INTRAVENOUS

## 2019-02-01 MED ORDER — OXYCODONE HCL 5 MG PO TABS
5.0000 mg | ORAL_TABLET | ORAL | Status: DC | PRN
  Administered 2019-02-02 (×2): 5 mg via ORAL
  Filled 2019-02-01 (×2): qty 1

## 2019-02-01 MED ORDER — HYDRALAZINE HCL 20 MG/ML IJ SOLN
INTRAMUSCULAR | Status: DC | PRN
  Administered 2019-02-01: 5 mg via INTRAVENOUS

## 2019-02-01 MED ORDER — LIDOCAINE HCL 2 % IJ SOLN
INTRAMUSCULAR | Status: AC
  Filled 2019-02-01: qty 20

## 2019-02-01 MED ORDER — MIDAZOLAM HCL 5 MG/5ML IJ SOLN
INTRAMUSCULAR | Status: DC | PRN
  Administered 2019-02-01: 2 mg via INTRAVENOUS

## 2019-02-01 MED ORDER — ONDANSETRON HCL 4 MG/2ML IJ SOLN
INTRAMUSCULAR | Status: AC
  Filled 2019-02-01: qty 2

## 2019-02-01 MED ORDER — ALVIMOPAN 12 MG PO CAPS
12.0000 mg | ORAL_CAPSULE | Freq: Two times a day (BID) | ORAL | Status: DC
  Administered 2019-02-02 – 2019-02-05 (×7): 12 mg via ORAL
  Filled 2019-02-01 (×8): qty 1

## 2019-02-01 MED ORDER — LIDOCAINE HCL (CARDIAC) PF 100 MG/5ML IV SOSY
PREFILLED_SYRINGE | INTRAVENOUS | Status: DC | PRN
  Administered 2019-02-01: 60 mg via INTRAVENOUS

## 2019-02-01 MED ORDER — LACTATED RINGERS IV SOLN
INTRAVENOUS | Status: DC
  Administered 2019-02-01 (×4): via INTRAVENOUS

## 2019-02-01 MED ORDER — ACETAMINOPHEN 10 MG/ML IV SOLN
1000.0000 mg | Freq: Four times a day (QID) | INTRAVENOUS | Status: AC
  Administered 2019-02-01 – 2019-02-02 (×4): 1000 mg via INTRAVENOUS
  Filled 2019-02-01 (×4): qty 100

## 2019-02-01 MED ORDER — KETAMINE HCL 10 MG/ML IJ SOLN
INTRAMUSCULAR | Status: AC
  Filled 2019-02-01: qty 1

## 2019-02-01 MED ORDER — MIDAZOLAM HCL 2 MG/2ML IJ SOLN
INTRAMUSCULAR | Status: AC
  Filled 2019-02-01: qty 2

## 2019-02-01 MED ORDER — LACTATED RINGERS IR SOLN
Status: DC | PRN
  Administered 2019-02-01: 1000 mL

## 2019-02-01 MED ORDER — ROCURONIUM BROMIDE 10 MG/ML (PF) SYRINGE
PREFILLED_SYRINGE | INTRAVENOUS | Status: AC
  Filled 2019-02-01: qty 10

## 2019-02-01 MED ORDER — BUPIVACAINE LIPOSOME 1.3 % IJ SUSP
20.0000 mL | Freq: Once | INTRAMUSCULAR | Status: AC
  Administered 2019-02-01: 13:00:00 20 mL
  Filled 2019-02-01: qty 20

## 2019-02-01 MED ORDER — PIPERACILLIN-TAZOBACTAM 3.375 G IVPB
3.3750 g | Freq: Three times a day (TID) | INTRAVENOUS | Status: AC
  Administered 2019-02-01 – 2019-02-02 (×3): 3.375 g via INTRAVENOUS
  Filled 2019-02-01 (×3): qty 50

## 2019-02-01 SURGICAL SUPPLY — 106 items
APPLICATOR COTTON TIP 6 STRL (MISCELLANEOUS) ×2 IMPLANT
APPLICATOR COTTON TIP 6IN STRL (MISCELLANEOUS) ×3
APPLICATOR SURGIFLO ENDO (HEMOSTASIS) IMPLANT
BAG LAPAROSCOPIC 12 15 PORT 16 (BASKET) ×2 IMPLANT
BAG RETRIEVAL 12/15 (BASKET) ×3
BAG URO CATCHER STRL LF (MISCELLANEOUS) IMPLANT
BENZOIN TINCTURE PRP APPL 2/3 (GAUZE/BANDAGES/DRESSINGS) IMPLANT
BLADE HEX COATED 2.75 (ELECTRODE) IMPLANT
BLADE SURG SZ10 CARB STEEL (BLADE) IMPLANT
BRIEF STRETCH FOR OB PAD LRG (UNDERPADS AND DIAPERS) ×3 IMPLANT
CATH SILICONE 5CC 18FR (INSTRUMENTS) ×3 IMPLANT
CELLS DAT CNTRL 66122 CELL SVR (MISCELLANEOUS) ×2 IMPLANT
CHLORAPREP W/TINT 26 (MISCELLANEOUS) ×3 IMPLANT
CLIP VESOLOCK LG 6/CT PURPLE (CLIP) ×15 IMPLANT
CLIP VESOLOCK MED LG 6/CT (CLIP) ×3 IMPLANT
CLIP VESOLOCK XL 6/CT (CLIP) ×9 IMPLANT
CLOTH BEACON ORANGE TIMEOUT ST (SAFETY) IMPLANT
CONT SPEC 4OZ CLIKSEAL STRL BL (MISCELLANEOUS) ×3 IMPLANT
COVER SURGICAL LIGHT HANDLE (MISCELLANEOUS) ×3 IMPLANT
COVER TIP SHEARS 8 DVNC (MISCELLANEOUS) ×2 IMPLANT
COVER TIP SHEARS 8MM DA VINCI (MISCELLANEOUS) ×1
COVER WAND RF STERILE (DRAPES) IMPLANT
DECANTER SPIKE VIAL GLASS SM (MISCELLANEOUS) ×3 IMPLANT
DERMABOND ADVANCED (GAUZE/BANDAGES/DRESSINGS) ×1
DERMABOND ADVANCED .7 DNX12 (GAUZE/BANDAGES/DRESSINGS) ×2 IMPLANT
DRAIN CHANNEL RND F F (WOUND CARE) IMPLANT
DRAIN PENROSE 18X1/2 LTX STRL (DRAIN) IMPLANT
DRAPE ARM DVNC X/XI (DISPOSABLE) ×8 IMPLANT
DRAPE COLUMN DVNC XI (DISPOSABLE) ×2 IMPLANT
DRAPE DA VINCI XI ARM (DISPOSABLE) ×4
DRAPE DA VINCI XI COLUMN (DISPOSABLE) ×1
DRSG TEGADERM 4X4.75 (GAUZE/BANDAGES/DRESSINGS) IMPLANT
ELECT PENCIL ROCKER SW 15FT (MISCELLANEOUS) ×3 IMPLANT
ELECT REM PT RETURN 15FT ADLT (MISCELLANEOUS) ×3 IMPLANT
EVACUATOR SILICONE 100CC (DRAIN) IMPLANT
GAUZE 4X4 16PLY RFD (DISPOSABLE) IMPLANT
GAUZE SPONGE 2X2 8PLY STRL LF (GAUZE/BANDAGES/DRESSINGS) ×2 IMPLANT
GLOVE BIO SURGEON STRL SZ 6.5 (GLOVE) ×12 IMPLANT
GLOVE BIOGEL M STRL SZ7.5 (GLOVE) ×9 IMPLANT
GLOVE BIOGEL PI IND STRL 7.5 (GLOVE) ×2 IMPLANT
GLOVE BIOGEL PI INDICATOR 7.5 (GLOVE) ×1
GOWN STRL REUS W/TWL LRG LVL3 (GOWN DISPOSABLE) ×18 IMPLANT
GOWN STRL REUS W/TWL XL LVL3 (GOWN DISPOSABLE) ×3 IMPLANT
IRRIG SUCT STRYKERFLOW 2 WTIP (MISCELLANEOUS) ×3
IRRIGATION SUCT STRKRFLW 2 WTP (MISCELLANEOUS) ×2 IMPLANT
KIT PROCEDURE DA VINCI SI (MISCELLANEOUS) ×1
KIT PROCEDURE DVNC SI (MISCELLANEOUS) ×2 IMPLANT
KIT TURNOVER KIT A (KITS) IMPLANT
LOOP VESSEL MAXI BLUE (MISCELLANEOUS) ×3 IMPLANT
MANIFOLD NEPTUNE II (INSTRUMENTS) ×3 IMPLANT
NEEDLE ASPIRATION 22 (NEEDLE) ×3 IMPLANT
NEEDLE INSUFFLATION 14GA 120MM (NEEDLE) ×3 IMPLANT
PACK CYSTO (CUSTOM PROCEDURE TRAY) IMPLANT
PACK ROBOT UROLOGY CUSTOM (CUSTOM PROCEDURE TRAY) ×3 IMPLANT
PAD OB MATERNITY 4.3X12.25 (PERSONAL CARE ITEMS) ×3 IMPLANT
PAD POSITIONING PINK XL (MISCELLANEOUS) ×3 IMPLANT
PORT ACCESS TROCAR AIRSEAL 12 (TROCAR) ×2 IMPLANT
PORT ACCESS TROCAR AIRSEAL 5M (TROCAR) ×1
RELOAD STAPLER GREEN 60MM (STAPLE) ×10 IMPLANT
RELOAD STAPLER WHITE 60MM (STAPLE) ×12 IMPLANT
RETRACTOR LONRSTAR 16.6X16.6CM (MISCELLANEOUS) IMPLANT
RETRACTOR STAY HOOK 5MM (MISCELLANEOUS) IMPLANT
RETRACTOR STER APS 16.6X16.6CM (MISCELLANEOUS)
RTRCTR WOUND ALEXIS 18CM MED (MISCELLANEOUS) ×3
SEAL CANN UNIV 5-8 DVNC XI (MISCELLANEOUS) ×8 IMPLANT
SEAL XI 5MM-8MM UNIVERSAL (MISCELLANEOUS) ×4
SET TRI-LUMEN FLTR TB AIRSEAL (TUBING) IMPLANT
SET TUBE SMOKE EVAC HIGH FLOW (TUBING) ×3 IMPLANT
SOLUTION ELECTROLUBE (MISCELLANEOUS) ×3 IMPLANT
SPONGE GAUZE 2X2 STER 10/PKG (GAUZE/BANDAGES/DRESSINGS) ×1
SPONGE LAP 18X18 RF (DISPOSABLE) ×6 IMPLANT
SPONGE LAP 4X18 RFD (DISPOSABLE) ×3 IMPLANT
STAPLER ECHELON LONG 60 440 (INSTRUMENTS) ×3 IMPLANT
STAPLER RELOAD GREEN 60MM (STAPLE) ×15
STAPLER RELOAD WHITE 60MM (STAPLE) ×18
STENT SET URETHERAL LEFT 7FR (STENTS) ×3 IMPLANT
STENT SET URETHERAL RIGHT 7FR (STENTS) ×3 IMPLANT
SURGIFLO W/THROMBIN 8M KIT (HEMOSTASIS) IMPLANT
SUT CHROMIC 4 0 RB 1X27 (SUTURE) ×3 IMPLANT
SUT ETHILON 3 0 PS 1 (SUTURE) ×3 IMPLANT
SUT MNCRL AB 4-0 PS2 18 (SUTURE) ×6 IMPLANT
SUT PDS AB 0 CTX 36 PDP370T (SUTURE) ×9 IMPLANT
SUT SILK 3 0 SH 30 (SUTURE) IMPLANT
SUT SILK 3 0 SH CR/8 (SUTURE) ×3 IMPLANT
SUT V-LOC BARB 180 2/0GR6 GS22 (SUTURE) ×6
SUT VIC AB 2-0 CT1 27 (SUTURE) ×1
SUT VIC AB 2-0 CT1 27XBRD (SUTURE) ×2 IMPLANT
SUT VIC AB 2-0 SH 18 (SUTURE) IMPLANT
SUT VIC AB 2-0 UR5 27 (SUTURE) ×12 IMPLANT
SUT VIC AB 3-0 SH 27 (SUTURE) ×6
SUT VIC AB 3-0 SH 27X BRD (SUTURE) ×4 IMPLANT
SUT VIC AB 3-0 SH 27XBRD (SUTURE) ×8 IMPLANT
SUT VIC AB 4-0 RB1 27 (SUTURE) ×4
SUT VIC AB 4-0 RB1 27XBRD (SUTURE) ×8 IMPLANT
SUT VLOC BARB 180 ABS3/0GR12 (SUTURE) ×3
SUTURE V-LC BRB 180 2/0GR6GS22 (SUTURE) ×4 IMPLANT
SUTURE VLOC BRB 180 ABS3/0GR12 (SUTURE) ×2 IMPLANT
SYR CONTROL 10ML LL (SYRINGE) IMPLANT
SYSTEM UROSTOMY GENTLE TOUCH (WOUND CARE) ×3 IMPLANT
TOWEL OR NON WOVEN STRL DISP B (DISPOSABLE) ×3 IMPLANT
TROCAR BLADELESS 15MM (ENDOMECHANICALS) ×3 IMPLANT
TROCAR PORT AIRSEAL 12X150 (TUBING) IMPLANT
TUBING CONNECTING 10 (TUBING) IMPLANT
WATER STERILE IRR 1000ML POUR (IV SOLUTION) ×6 IMPLANT
WATER STERILE IRR 3000ML UROMA (IV SOLUTION) ×3 IMPLANT
YANKAUER SUCT BULB TIP 10FT TU (MISCELLANEOUS) IMPLANT

## 2019-02-01 NOTE — Anesthesia Procedure Notes (Signed)
Procedure Name: Intubation Date/Time: 02/01/2019 7:36 AM Performed by: Glory Buff, CRNA Pre-anesthesia Checklist: Patient identified, Emergency Drugs available, Suction available and Patient being monitored Patient Re-evaluated:Patient Re-evaluated prior to induction Oxygen Delivery Method: Circle system utilized Preoxygenation: Pre-oxygenation with 100% oxygen Induction Type: IV induction Ventilation: Mask ventilation without difficulty Laryngoscope Size: Miller and 3 Grade View: Grade II Tube type: Oral Tube size: 7.0 mm Number of attempts: 1 Airway Equipment and Method: Stylet and Oral airway Placement Confirmation: ETT inserted through vocal cords under direct vision,  positive ETCO2 and breath sounds checked- equal and bilateral Secured at: 20 cm Tube secured with: Tape Dental Injury: Teeth and Oropharynx as per pre-operative assessment  Comments: DL x 1 with Sabra Heck 3 on right side of mouth to avoid upper bridge, vocal cords seen with cricoid pressure, oral intubation, upper bridge firmly attached with no looseness noted

## 2019-02-01 NOTE — H&P (Signed)
Lauren Salazar is an 58 y.o. female.    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 Lovena Neighbours 10/2018. Staging CT with few borderline (<1cm) Rt pelvic nodes and Rt lung mass. >30PY smoker.    PMH sig for CS x 2. NO CV disease / blood thinners. PCP through [place].   Today "Lauren Salazar" is seen to proceed cystectomy, TAH, BSO,, cysto-ICG. She had stomal marking last night and completed bowel prep to clear. Hgb and Cr acceptable.      Past Medical History:  Diagnosis Date  . Bladder tumor   . Complication of anesthesia    slow to wake after spinal fusion, then was "looney" for the following 24 hours   . Persistent dry cough    ongoing since feb 2020    Past Surgical History:  Procedure Laterality Date  . CERVICAL FUSION  2005   c5-c6     . TRANSURETHRAL RESECTION OF BLADDER TUMOR N/A 10/31/2018   Procedure: TRANSURETHRAL RESECTION OF BLADDER TUMOR (TURBT) WITH CYSTOSCOPY/ POSSIBLE INTRAVESICAL GEMCITABINE;  Surgeon: Ceasar Mons, MD;  Location: [facility];  Service: Urology;  Laterality: N/A;    History reviewed. No pertinent family history. Social History:  reports that she has been smoking cigarettes. She has been smoking about 1.00 pack per day. She has never used smokeless tobacco. She reports that she does not drink alcohol or use drugs.  Allergies: No Known Allergies  Medications Prior to Admission  Medication Sig Dispense Refill  . acetaminophen (TYLENOL) 500 MG tablet Take 1,000 mg by mouth every 8 (eight) hours as needed (pain).     Marland Kitchen ibuprofen (ADVIL) 200 MG tablet Take 200 mg by mouth every 6 (six) hours as needed for moderate pain.    . nicotine (NICODERM CQ - DOSED IN MG/24 HOURS) 21 mg/24hr patch Place 21 mg onto the skin daily.      Results for orders placed or performed during the hospital encounter of 01/31/19 (from the past 48 hour(s))  CBC     Status:  None   Collection Time: 01/31/19  4:54 PM  Result Value Ref Range   WBC 9.4 4.0 - 10.5 K/uL   RBC 4.58 3.87 - 5.11 MIL/uL   Hemoglobin 13.5 12.0 - 15.0 g/dL   HCT 42.6 36.0 - 46.0 %   MCV 93.0 80.0 - 100.0 fL   MCH 29.5 26.0 - 34.0 pg   MCHC 31.7 30.0 - 36.0 g/dL   RDW 13.2 11.5 - 15.5 %   Platelets 254 150 - 400 K/uL   nRBC 0.0 0.0 - 0.2 %    Comment: Performed at [facility], [place] [address], [place], [place] [address]  Comprehensive metabolic panel     Status: Abnormal   Collection Time: 01/31/19  4:54 PM  Result Value Ref Range   Sodium 141 135 - 145 mmol/L   Potassium 3.9 3.5 - 5.1 mmol/L   Chloride 105 98 - 111 mmol/L   CO2 27 22 - 32 mmol/L   Glucose, Bld 100 (H) 70 - 99 mg/dL   BUN 12 6 - 20 mg/dL   Creatinine, Ser 0.79 0.44 - 1.00 mg/dL   Calcium 8.8 (L) 8.9 - 10.3 mg/dL   Total Protein 6.9 6.5 - 8.1 g/dL   Albumin 3.7 3.5 - 5.0 g/dL   AST 14 (L) 15 - 41 U/L   ALT 12 0 -  44 U/L   Alkaline Phosphatase 73 38 - 126 U/L   Total Bilirubin 0.1 (L) 0.3 - 1.2 mg/dL   GFR calc non Af Amer >60 >60 mL/min   GFR calc Af Amer >60 >60 mL/min   Anion gap 9 5 - 15    Comment: Performed at [facility], [place] [address], [place], [place] [address]  SARS Coronavirus 2 by RT PCR (hospital order, performed in [facility] hospital lab) Nasopharyngeal Nasopharyngeal Swab     Status: None   Collection Time: 01/31/19  5:23 PM   Specimen: Nasopharyngeal Swab  Result Value Ref Range   SARS Coronavirus 2 NEGATIVE NEGATIVE    Comment: (NOTE) If result is NEGATIVE SARS-CoV-2 target nucleic acids are NOT DETECTED. The SARS-CoV-2 RNA is generally detectable in upper and lower  respiratory specimens during the acute phase of infection. The lowest  concentration of SARS-CoV-2 viral copies this assay can detect is 250  copies / mL. A negative result does not preclude SARS-CoV-2 infection  and should not be used as the sole basis for treatment or other   patient management decisions.  A negative result may occur with  improper specimen collection / handling, submission of specimen other  than nasopharyngeal swab, presence of viral mutation(s) within the  areas targeted by this assay, and inadequate number of viral copies  (<250 copies / mL). A negative result must be combined with clinical  observations, patient history, and epidemiological information. If result is POSITIVE SARS-CoV-2 target nucleic acids are DETECTED. The SARS-CoV-2 RNA is generally detectable in upper and lower  respiratory specimens dur ing the acute phase of infection.  Positive  results are indicative of active infection with SARS-CoV-2.  Clinical  correlation with patient history and other diagnostic information is  necessary to determine patient infection status.  Positive results do  not rule out bacterial infection or co-infection with other viruses. If result is PRESUMPTIVE POSTIVE SARS-CoV-2 nucleic acids MAY BE PRESENT.   A presumptive positive result was obtained on the submitted specimen  and confirmed on repeat testing.  While 2019 novel coronavirus  (SARS-CoV-2) nucleic acids may be present in the submitted sample  additional confirmatory testing may be necessary for epidemiological  and / or clinical management purposes  to differentiate between  SARS-CoV-2 and other Sarbecovirus currently known to infect humans.  If clinically indicated additional testing with an alternate test  methodology [phone]) is advised. The SARS-CoV-2 RNA is generally  detectable in upper and lower respiratory sp ecimens during the acute  phase of infection. The expected result is Negative. Fact Sheet for Patients:  StrictlyIdeas.no Fact Sheet for Healthcare Providers: BankingDealers.co.za This test is not yet approved or cleared by the [place] FDA and has been authorized for detection and/or diagnosis of SARS-CoV-2  by FDA under an Emergency Use Authorization (EUA).  This EUA will remain in effect (meaning this test can be used) for the duration of the COVID-19 declaration under Section 564(b)(1) of the Act, 21 U.S.C. section 360bbb-3(b)(1), unless the authorization is terminated or revoked sooner. Performed at [facility], [place] [address], [place], [place] [address]   Surgical PCR screen     Status: None   Collection Time: 01/31/19  7:30 PM   Specimen: Nasal Mucosa; Nasal Swab  Result Value Ref Range   MRSA, PCR NEGATIVE NEGATIVE   Staphylococcus aureus NEGATIVE NEGATIVE    Comment: (NOTE) The Xpert SA Assay (FDA approved for NASAL specimens in patients 22 years  of age and older), is one component of a comprehensive surveillance program. It is not intended to diagnose infection nor to guide or monitor treatment. Performed at [facility], [place] [address], [place], [place] [address]    No results found.  Review of Systems  Constitutional: Negative.  Negative for fever.  HENT: Negative.   Eyes: Negative.   Respiratory: Negative.   Cardiovascular: Negative.   Gastrointestinal: Negative.   Genitourinary: Negative.   Musculoskeletal: Negative.   Skin: Negative.   Neurological: Negative.   Endo/Heme/Allergies: Negative.   Psychiatric/Behavioral: Negative.     Blood pressure (!) 149/93, pulse 79, temperature 98.2 F (36.8 C), temperature source Oral, resp. rate 15, height 5\' 6"  (1.676 m), weight 68 kg, SpO2 97 %. Physical Exam  Constitutional: She appears well-developed.  HENT:  Head: Normocephalic.  Eyes: Pupils are equal, round, and reactive to light.  Neck: Normal range of motion.  Cardiovascular: Normal rate.  Respiratory: Effort normal.  GI: Soft.  RLQ stomal marking site noted.   Genitourinary:    Genitourinary Comments: NO CVAT   Musculoskeletal: Normal range of motion.  Neurological: She is alert.  Skin: Skin is warm.  Psychiatric:  She has a normal mood and affect.     Assessment/Plan  Proceed as planned with cysto/ICG injection, robotic cystectomy/TAH/BSO and conduit diversion for aggressive variant bladder cancer. Risks, benefits, alternatives, expected peri-op course discussed extensively previously and reiterated today.   Alexis Frock, MD 02/01/2019, 7:15 AM

## 2019-02-01 NOTE — Anesthesia Postprocedure Evaluation (Signed)
Anesthesia Post Note  Patient: Lauren Salazar  Procedure(s) Performed: XI ROBOTIC ASSISTED LAPAROSCOPIC COMPLETE CYSTECTOMY, ILEAL CONDUIT, HYSTERECTOMY WITH BILATERAL SALPINGOOPHORECTOMY (N/A ) LYMPHADENECTOMY (Bilateral ) CYSTOSCOPY WITH INJECTION (N/A )     Patient location during evaluation: PACU Anesthesia Type: General Level of consciousness: awake and alert Pain management: pain level controlled Vital Signs Assessment: post-procedure vital signs reviewed and stable Respiratory status: spontaneous breathing, nonlabored ventilation, respiratory function stable and patient connected to nasal cannula oxygen Cardiovascular status: blood pressure returned to baseline and stable Postop Assessment: no apparent nausea or vomiting Anesthetic complications: no    Last Vitals:  Vitals:   02/01/19 1530 02/01/19 1545  BP: (!) 167/92 (!) 173/92  Pulse: 79 80  Resp: 16 13  Temp:    SpO2: 100% 100%    Last Pain:  Vitals:   02/01/19 1530  TempSrc:   PainSc: Asleep                 Jorden Mahl,W. EDMOND

## 2019-02-01 NOTE — Discharge Instructions (Signed)

## 2019-02-01 NOTE — Brief Op Note (Signed)
02/01/2019  2:09 PM  PATIENT:  Lauren Salazar  58 y.o. female  PRE-OPERATIVE DIAGNOSIS:  BLADDER CANCER  POST-OPERATIVE DIAGNOSIS:  BLADDER CANCER  PROCEDURE:  Procedure(s) with comments: XI ROBOTIC ASSISTED LAPAROSCOPIC COMPLETE CYSTECTOMY, ILEAL CONDUIT, HYSTERECTOMY WITH BILATERAL SALPINGOOPHORECTOMY (N/A) - 6 HRS LYMPHADENECTOMY (Bilateral) CYSTOSCOPY WITH INJECTION (N/A)  SURGEON:  Surgeon(s) and Role:    Alexis Frock, MD - Primary  PHYSICIAN ASSISTANT:   ASSISTANTS: Clemetine Marker PA   ANESTHESIA:   local and general  EBL:  100 mL   BLOOD ADMINISTERED:none  DRAINS: 1 - JP to bulb; 2 - RLQ Urostomy to gravity wtih Rt (red) and Lt (blue) Bander stents   LOCAL MEDICATIONS USED:  MARCAINE     SPECIMEN:  Source of Specimen:  1 - distal ureteral margins; 2- bladder + uterus + ubes + bilateral ovaries en bloc; 3 - pelvic lymph nodes  DISPOSITION OF SPECIMEN:  PATHOLOGY  COUNTS:  YES  TOURNIQUET:  * No tourniquets in log *  DICTATION: .Other Dictation: Dictation Number [id number]  PLAN OF CARE: Admit to inpatient   PATIENT DISPOSITION:  PACU - hemodynamically stable.   Delay start of Pharmacological VTE agent (>24hrs) due to surgical blood loss or risk of bleeding: yes

## 2019-02-01 NOTE — Transfer of Care (Signed)
Immediate Anesthesia Transfer of Care Note  Patient: KOI YARBRO  Procedure(s) Performed: XI ROBOTIC ASSISTED LAPAROSCOPIC COMPLETE CYSTECTOMY, ILEAL CONDUIT, HYSTERECTOMY WITH BILATERAL SALPINGOOPHORECTOMY (N/A ) LYMPHADENECTOMY (Bilateral ) CYSTOSCOPY WITH INJECTION (N/A )  Patient Location: PACU  Anesthesia Type:General  Level of Consciousness: drowsy, patient cooperative and responds to stimulation  Airway & Oxygen Therapy: Patient Spontanous Breathing and Patient connected to face mask oxygen  Post-op Assessment: Report given to RN and Post -op Vital signs reviewed and stable  Post vital signs: Reviewed and stable  Last Vitals:  Vitals Value Taken Time  BP 166/91 02/01/19 1403  Temp    Pulse 84 02/01/19 1407  Resp 17 02/01/19 1407  SpO2 100 % 02/01/19 1407  Vitals shown include unvalidated device data.  Last Pain:  Vitals:   02/01/19 0605  TempSrc:   PainSc: 7       Patients Stated Pain Goal: 4 (65/78/46 9629)  Complications: No apparent anesthesia complications

## 2019-02-02 ENCOUNTER — Encounter (HOSPITAL_COMMUNITY): Payer: Self-pay | Admitting: Urology

## 2019-02-02 LAB — BASIC METABOLIC PANEL
Anion gap: 11 (ref 5–15)
BUN: 9 mg/dL (ref 6–20)
CO2: 22 mmol/L (ref 22–32)
Calcium: 8.1 mg/dL — ABNORMAL LOW (ref 8.9–10.3)
Chloride: 103 mmol/L (ref 98–111)
Creatinine, Ser: 0.73 mg/dL (ref 0.44–1.00)
GFR calc Af Amer: >60 mL/min (ref >60)
GFR calc non Af Amer: >60 mL/min (ref >60)
Glucose, Bld: 162 mg/dL — ABNORMAL HIGH (ref 70–99)
Potassium: 3.8 mmol/L (ref 3.5–5.1)
Sodium: 136 mmol/L (ref 135–145)

## 2019-02-02 LAB — HEMOGLOBIN AND HEMATOCRIT, BLOOD
HCT: 38.2 % (ref 36.0–46.0)
Hemoglobin: 12 g/dL (ref 12.0–15.0)

## 2019-02-02 MED ORDER — ACETAMINOPHEN 10 MG/ML IV SOLN
1000.0000 mg | Freq: Four times a day (QID) | INTRAVENOUS | Status: AC
  Administered 2019-02-02 – 2019-02-03 (×4): 1000 mg via INTRAVENOUS
  Filled 2019-02-02 (×5): qty 100

## 2019-02-02 MED ORDER — METOCLOPRAMIDE HCL 5 MG/ML IJ SOLN
10.0000 mg | Freq: Once | INTRAMUSCULAR | Status: AC
  Administered 2019-02-02: 10 mg via INTRAVENOUS
  Filled 2019-02-02: qty 2

## 2019-02-02 MED ORDER — OXYCODONE HCL 5 MG PO TABS
5.0000 mg | ORAL_TABLET | ORAL | Status: DC | PRN
  Administered 2019-02-02: 10 mg via ORAL
  Administered 2019-02-02 (×2): 5 mg via ORAL
  Administered 2019-02-03 – 2019-02-07 (×24): 10 mg via ORAL
  Filled 2019-02-02 (×18): qty 2
  Filled 2019-02-02 (×2): qty 1
  Filled 2019-02-02 (×7): qty 2

## 2019-02-02 MED ORDER — LIDOCAINE 5 % EX PTCH
1.0000 | MEDICATED_PATCH | Freq: Once | CUTANEOUS | Status: DC
  Filled 2019-02-02: qty 1

## 2019-02-02 NOTE — Progress Notes (Signed)
Pt still not getting much pain relief.

## 2019-02-02 NOTE — Progress Notes (Addendum)
1 Day Post-Op Subjective: Patient is stable this morning with pain well controlled.  She did have some nausea and emesis overnight which she believes was in the setting of having pain medications, this is improving slightly this morning.  Urine output initially sluggish but adequate this morning.  UOP 825 cc since OR, 38 cc/h overnight. JP drain 300 cc  Objective: Vital signs in last 24 hours: Temp:  [97.6 F (36.4 C)-98.5 F (36.9 C)] 98.2 F (36.8 C) (10/10 0800) Pulse Rate:  [79-96] 83 (10/10 0600) Resp:  [11-24] 24 (10/10 0600) BP: (130-176)/(72-109) 164/79 (10/10 0600) SpO2:  [93 %-100 %] 97 % (10/10 0600)  Intake/Output from previous day: 10/09 0701 - 10/10 0700 In: 5176.1 [I.V.:4815.5; IV Piggyback:360.6] Out: 1225 [Urine:825; Drains:300; Blood:100] Intake/Output this shift: Total I/O In: -  Out: 50 [Drains:50]  Physical Exam:  General: Alert and oriented CV: Normal rate Lungs: Normal work of breathing on 2 L nasal cannula Abdomen: Soft, appropriately tender, incisions clean dry intact.  JP in place with serous saying fluid in bulb. GU: Stoma pink and healthy.  Urostomy draining pink-tinged urine with no blood clots.  2 bander stents present and visible in appropriate position within urostomy bag.  Lab Results: Recent Labs    01/31/19 1654 02/01/19 1500 02/02/19 0314  HGB 13.5 13.3 12.0  HCT 42.6 44.1 38.2   BMET Recent Labs    01/31/19 1654 02/02/19 0314  NA 141 136  K 3.9 3.8  CL 105 103  CO2 27 22  GLUCOSE 100* 162*  BUN 12 9  CREATININE 0.79 0.73  CALCIUM 8.8* 8.1*     Studies/Results: No results found.  Assessment/Plan: 58 year old female with mild COPD and concurrent lung cancer (treatment pending surgery for bladder cancer) status post robot-assisted laparoscopic radical cystectomy with ileal conduit on 02/01/2019.  Is progressing appropriately with mild nausea and emesis after administration of narcotic medications.  Will advance diet  very slowly with ice chips and a few sips of clears for comfort today.  Continue Entereg until having bowel movements/ flatus. continue current pain regimen. transfer to floor status.  Encourage ambulation.  Wean oxygen as able.   LOS: 2 days   Haskel Schroeder 02/02/2019, 10:42 AM   I agree with the assessment and plan as outlined by Dr. Jake Shark Lovena Neighbours, MD [facility]

## 2019-02-03 LAB — BASIC METABOLIC PANEL
Anion gap: 9 (ref 5–15)
BUN: 6 mg/dL (ref 6–20)
CO2: 27 mmol/L (ref 22–32)
Calcium: 8.2 mg/dL — ABNORMAL LOW (ref 8.9–10.3)
Chloride: 105 mmol/L (ref 98–111)
Creatinine, Ser: 0.66 mg/dL (ref 0.44–1.00)
GFR calc Af Amer: >60 mL/min (ref >60)
GFR calc non Af Amer: >60 mL/min (ref >60)
Glucose, Bld: 145 mg/dL — ABNORMAL HIGH (ref 70–99)
Potassium: 3.1 mmol/L — ABNORMAL LOW (ref 3.5–5.1)
Sodium: 141 mmol/L (ref 135–145)

## 2019-02-03 LAB — HEMOGLOBIN AND HEMATOCRIT, BLOOD
HCT: 36.8 % (ref 36.0–46.0)
Hemoglobin: 11.7 g/dL — ABNORMAL LOW (ref 12.0–15.0)

## 2019-02-03 MED ORDER — SIMETHICONE 80 MG PO CHEW
80.0000 mg | CHEWABLE_TABLET | Freq: Four times a day (QID) | ORAL | Status: DC | PRN
  Administered 2019-02-03 – 2019-02-07 (×7): 80 mg via ORAL
  Filled 2019-02-03 (×8): qty 1

## 2019-02-03 MED ORDER — POTASSIUM CHLORIDE 20 MEQ PO PACK
40.0000 meq | PACK | Freq: Once | ORAL | Status: AC
  Administered 2019-02-03: 40 meq via ORAL
  Filled 2019-02-03: qty 2

## 2019-02-03 NOTE — Progress Notes (Signed)
Pt transferred to 1408 in the bed by RN and NT after all questions and concerns were answered by RN. Report given to Ria Comment, [place]. Belongings transferred to new room with pt.

## 2019-02-03 NOTE — Progress Notes (Signed)
Attempted to contact daughter, [name] to inform her of pt transfer to 4E. No answer, but message left with information, and told to call back with any questions or concerns.

## 2019-02-03 NOTE — Evaluation (Addendum)
Physical Therapy Evaluation Patient Details Name: Lauren Salazar MRN: [id number] DOB: 1960-11-06 Today's Date: 02/03/2019   History of Present Illness  58 yo female admitted with bladder cancer. S/P lap cystectomy, ileal conduit, hysterectomy bil salpingo-oophorectomy 10/9. Hx of COPD, lung ca.  Clinical Impression  On eval, pt required Min assist for mobility. She walked ~20 feet with a RW. Moderate abd pain with activity. Discussed d/c plan-pt plans to return home with assistance from her friend "Zenia Resides". She stated she has a walker already. Will follow and progress activity as tolerated. Recommend HHPT at this time.     Follow Up Recommendations Home health PT;Supervision - Intermittent    Equipment Recommendations  None recommended by PT    Recommendations for Other Services       Precautions / Restrictions Precautions Precautions: Fall Precaution Comments: L jp drain; urostomy Restrictions Weight Bearing Restrictions: No      Mobility  Bed Mobility Overal bed mobility: Needs Assistance Bed Mobility: Supine to Sit;Sit to Supine     Supine to sit: Min assist;HOB elevated Sit to supine: Min guard;HOB elevated   General bed mobility comments: Assist for trunk. Increased time. Pt relied on bedrail  Transfers Overall transfer level: Needs assistance Equipment used: Rolling walker (2 wheeled) Transfers: Sit to/from Stand Sit to Stand: Min assist         General transfer comment: Assist to rise, stabilize, control descent. VCs safety, hand placement.  Ambulation/Gait Ambulation/Gait assistance: Min assist Gait Distance (Feet): 20 Feet Assistive device: Rolling walker (2 wheeled) Gait Pattern/deviations: Step-through pattern;Decreased stride length     General Gait Details: Assist to stabilize pt and manage walker. Slow gait speed. Remained in room on today.  Stairs            Wheelchair Mobility    Modified Rankin (Stroke Patients Only)        Balance Overall balance assessment: Needs assistance         Standing balance support: Bilateral upper extremity supported Standing balance-Leahy Scale: Poor                               Pertinent Vitals/Pain Pain Assessment: 0-10 Pain Score: 6  Pain Location: abdomen Pain Descriptors / Indicators: Sore;Discomfort Pain Intervention(s): Monitored during session;Limited activity within patient's tolerance;Repositioned    Home Living Family/patient expects to be discharged to:: Private residence Living Arrangements: Children(lives with son) Available Help at Discharge: Friend(s)("Allen") Type of Home: House         Home Equipment: Environmental consultant - 2 wheels      Prior Function Level of Independence: Independent               Hand Dominance        Extremity/Trunk Assessment   Upper Extremity Assessment Upper Extremity Assessment: Generalized weakness    Lower Extremity Assessment Lower Extremity Assessment: Generalized weakness    Cervical / Trunk Assessment Cervical / Trunk Assessment: Normal  Communication   Communication: No difficulties  Cognition Arousal/Alertness: Awake/alert(but drowsy due to meds) Behavior During Therapy: WFL for tasks assessed/performed Overall Cognitive Status: Within Functional Limits for tasks assessed                                        General Comments      Exercises     Assessment/Plan    PT  Assessment Patient needs continued PT services  PT Problem List Decreased strength;Decreased mobility;Decreased activity tolerance;Decreased balance;Decreased knowledge of use of DME;Pain       PT Treatment Interventions Gait training;DME instruction;Therapeutic activities;Therapeutic exercise;Patient/family education;Balance training;Functional mobility training    PT Goals (Current goals can be found in the Care Plan section)  Acute Rehab PT Goals Patient Stated Goal: less pain. regain PLOF. PT  Goal Formulation: With patient Time For Goal Achievement: 02/17/19 Potential to Achieve Goals: Good    Frequency Min 3X/week   Barriers to discharge        Co-evaluation               AM-PAC PT "6 Clicks" Mobility  Outcome Measure Help needed turning from your back to your side while in a flat bed without using bedrails?: A Little Help needed moving from lying on your back to sitting on the side of a flat bed without using bedrails?: A Little Help needed moving to and from a bed to a chair (including a wheelchair)?: A Little Help needed standing up from a chair using your arms (e.g., wheelchair or bedside chair)?: A Little Help needed to walk in hospital room?: A Little Help needed climbing 3-5 steps with a railing? : A Little 6 Click Score: 18    End of Session   Activity Tolerance: Patient limited by fatigue;Patient limited by pain Patient left: in bed;with call bell/phone within reach;with bed alarm set   PT Visit Diagnosis: Unsteadiness on feet (R26.81);Muscle weakness (generalized) (M62.81);Pain Pain - part of body: (abdomen)    Time: 8546-2703 PT Time Calculation (min) (ACUTE ONLY): 15 min   Charges:   PT Evaluation $PT Eval Moderate Complexity: [place], PT Acute Rehabilitation Services Pager: [phone] Office: [phone]

## 2019-02-03 NOTE — Progress Notes (Signed)
2 Days Post-Op Subjective: Patient progressing well this morning.  Has had some issues with pain control although she improves on Dilaudid.  She has ambulated about room.  Her nausea has greatly improved and she is tolerating sips of clears.  She would like to advance diet today.  Denies flatus, BM.  UOP 3.2 L JP drain 385 cc  Objective: Vital signs in last 24 hours: Temp:  [98.3 F (36.8 C)-100.2 F (37.9 C)] 98.3 F (36.8 C) (10/11 0400) Pulse Rate:  [79-94] 84 (10/11 0400) Resp:  [14-27] 19 (10/11 0400) BP: (130-176)/(72-87) 166/87 (10/11 0400) SpO2:  [92 %-96 %] 93 % (10/11 0400)  Intake/Output from previous day: 10/10 0701 - 10/11 0700 In: 2656.9 [I.V.:2463.7; IV Piggyback:193.2] Out: 7741 [Urine:3225; Drains:385] Intake/Output this shift: No intake/output data recorded.  Physical Exam:  General: Alert and oriented CV: Normal rate Lungs: Normal work of breathing  Abdomen: Soft, appropriately tender, incisions clean dry intact.  JP in place with serosang fluid in bulb. GU: Stoma pink and healthy.  Urostomy draining pink-tinged urine with no blood clots.  2 bander stents present and visible in appropriate position within urostomy bag.  Lab Results: Recent Labs    02/01/19 1500 02/02/19 0314 02/03/19 0251  HGB 13.3 12.0 11.7*  HCT 44.1 38.2 36.8   BMET Recent Labs    02/02/19 0314 02/03/19 0251  NA 136 141  K 3.8 3.1*  CL 103 105  CO2 22 27  GLUCOSE 162* 145*  BUN 9 6  CREATININE 0.73 0.66  CALCIUM 8.1* 8.2*     Studies/Results: No results found.  Assessment/Plan: 58 year old female with mild COPD and concurrent lung cancer (treatment pending surgery for bladder cancer) status post robot-assisted laparoscopic radical cystectomy with ileal conduit on 02/01/2019.  Is progressing appropriately, and her prior nausea and emesis has greatly improved.  We will plan to advance diet to clears today.  Replace potassium.  Ensure patient transferred to floor.   Continue to encourage ambulation and start working with PT today.    LOS: 3 days   Haskel Schroeder 02/03/2019, 8:16 AM

## 2019-02-04 MED ORDER — ACETAMINOPHEN 500 MG PO TABS
1000.0000 mg | ORAL_TABLET | Freq: Three times a day (TID) | ORAL | Status: DC
  Administered 2019-02-04 – 2019-02-06 (×5): 1000 mg via ORAL
  Filled 2019-02-04 (×5): qty 2

## 2019-02-04 NOTE — Progress Notes (Signed)
Physical Therapy Treatment Patient Details Name: Lauren Salazar MRN: [id number] DOB: 08/11/1960 Today's Date: 02/04/2019    History of Present Illness 58 yo female admitted with bladder cancer. S/P lap cystectomy, ileal conduit, hysterectomy bil salpingo-oophorectomy 10/9. Hx of COPD, lung ca.    PT Comments    Pt's pain meds discussed with RN in room and pt would like to take PRN meds after ambulating.  Pt assisted with ambulating however only tolerated short distance due to pain.  Pt with shaky UEs with mobilizing due to pain.     Follow Up Recommendations  Home health PT;Supervision - Intermittent     Equipment Recommendations  None recommended by PT    Recommendations for Other Services       Precautions / Restrictions Precautions Precautions: Fall Precaution Comments: L jp drain; urostomy Restrictions Weight Bearing Restrictions: No    Mobility  Bed Mobility Overal bed mobility: Needs Assistance Bed Mobility: Rolling;Sit to Supine;Sidelying to Sit;Sit to Sidelying Rolling: Min guard Sidelying to sit: Min guard     Sit to sidelying: Min guard General bed mobility comments: increased time, pt utilized bed rail to self assist  Transfers Overall transfer level: Needs assistance Equipment used: Rolling walker (2 wheeled) Transfers: Sit to/from Stand Sit to Stand: Min assist         General transfer comment: Assist to rise, stabilize, control descent. VCs safety, hand placement.  Ambulation/Gait Ambulation/Gait assistance: Min guard;Min assist Gait Distance (Feet): 40 Feet Assistive device: Rolling walker (2 wheeled) Gait Pattern/deviations: Step-through pattern;Decreased stride length Gait velocity: decr   General Gait Details: slow but steady with RW, limited distance due to pain   Stairs             Wheelchair Mobility    Modified Rankin (Stroke Patients Only)       Balance Overall balance assessment: Needs assistance          Standing balance support: Bilateral upper extremity supported Standing balance-Leahy Scale: Poor                              Cognition Arousal/Alertness: Awake/alert Behavior During Therapy: WFL for tasks assessed/performed Overall Cognitive Status: Within Functional Limits for tasks assessed                                        Exercises      General Comments        Pertinent Vitals/Pain Pain Score: 8  Pain Location: abdomen Pain Descriptors / Indicators: Sore;Discomfort Pain Intervention(s): Repositioned;Patient requesting pain meds-RN notified;Monitored during session;Limited activity within patient's tolerance    Home Living                      Prior Function            PT Goals (current goals can now be found in the care plan section) Progress towards PT goals: Progressing toward goals    Frequency    Min 3X/week      PT Plan Current plan remains appropriate    Co-evaluation              AM-PAC PT "6 Clicks" Mobility   Outcome Measure  Help needed turning from your back to your side while in a flat bed without using bedrails?: A Little Help needed moving from lying on  your back to sitting on the side of a flat bed without using bedrails?: A Little Help needed moving to and from a bed to a chair (including a wheelchair)?: A Little Help needed standing up from a chair using your arms (e.g., wheelchair or bedside chair)?: A Little Help needed to walk in hospital room?: A Little Help needed climbing 3-5 steps with a railing? : A Little 6 Click Score: 18    End of Session   Activity Tolerance: Patient limited by fatigue;Patient limited by pain Patient left: in bed;with call bell/phone within reach;with bed alarm set Nurse Communication: Mobility status;Patient requests pain meds PT Visit Diagnosis: Unsteadiness on feet (R26.81);Muscle weakness (generalized) (M62.81)     Time: 2202-5427 PT Time  Calculation (min) (ACUTE ONLY): 20 min  Charges:  $Gait Training: 8-22 mins                     Carmelia Bake, PT, DPT Acute Rehabilitation Services Office: [phone] Pager: [phone]  Trena Platt 02/04/2019, 4:24 PM

## 2019-02-04 NOTE — Consult Note (Signed)
[facility] Nurse ostomy follow up Patient receiving care in [place]. Stoma type/location: RUQ ileal conduit Stomal assessment/size: 1.25 inches, round, red, sutures intact, stents intact producing urine Peristomal assessment: intact Treatment options for stomal/peristomal skin: barrier ring Output:  Pink tinged urine in bedside collection bag Ostomy pouching: 2pc. Flat 2/14 inches Education provided: change process.  Patient had great difficulty concentrating on anything due to her pain. Enrolled patient in [facility] Start Discharge program: No Val Riles, RN, MSN, [facility], CNS-BC, pager [phone]

## 2019-02-04 NOTE — Op Note (Signed)
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  PROCEDURE: 1.  Cystoscopy with injection of an indocyanine green dye. 2.  Robotic-assisted laparoscopic pelvic exenteration with cystectomy, hysterectomy, bilateral salpingo-oophorectomy, ileal conduit urinary diversion and pelvic lymph node dissection.  ESTIMATED BLOOD LOSS:  100 mL.  COMPLICATIONS:  None.  SPECIMENS: 1.  Right distal ureteral margin frozen was negative. 2.  Left distal ureteral margin was negative. 3.  Right final ureteral margin. 4.  Left final ureteral margin. 5.  Right external iliac lymph nodes. 6.  Right obturator lymph nodes. 7.  Right internal iliac lymph nodes. 8.  Right common iliac lymph nodes. 9.  Aortic bifurcation lymph nodes. 10.  Left common iliac lymph nodes. 11.  Left external iliac lymph nodes. 12.  Left obturator lymph nodes. 13.  Left internal iliac lymph nodes. 14.  Bladder plus uterus plus cervix plus anterior vaginal wall plus ovaries plus tubes and block.  FINDINGS: 1.  Large volume of mostly necrotic appearing tumor in multiple quadrants of the urinary bladder on cystoscopy. 2.  Multiple sentinel lymph nodes seen in the right hemipelvis.  These were denoted on pathology requisition.  DRAINS:  1.  Jackson-Pratt drain to bulb suction. 2.  Right lower quadrant urostomy to gravity drainage with right (red) and left (blue) to gravity drainage.  ASSISTANT:  Debbrah Alar, PA  INDICATIONS:  The patient is a pleasant 58 year old lady who was found on workup of hematuria to have a very large volume squamous cell carcinoma of the bladder.  Staging imaging revealed no obvious bulky local disease.  She was found on staging imaging  to have a  right-sided lung mass, biopsy proven, separate foci of primary lung cancer.  She continued to have symptomatic hematuria.  Options were discussed both with ourselves and Medical Oncology who corroborated and agreed that optimal oncologic plan  to be up front pelvic exenteration with cystectomy given her squamous histology followed by likely primary therapy of her lung cancer and possibly combined with systemic therapy.  The patient chose for dual action of her lung and bladder tumors pending  her recovery.  She will proceed with this plan and she presents today for robotic cystectomy.  She was admitted yesterday for bowel prep and stomal marking.  Informed consent was then placed in medical record.  DESCRIPTION OF PROCEDURE:  The patient was identified.  Procedure being robotic cystectomy with ICG dye injection, removal of tubes and ovaries and conduit diversion was confirmed.  Procedure timeout was performed.  Intravenous antibiotics administered.   General endotracheal anesthesia induced.  The patient was placed into a low lithotomy position and sterile field was created prepping and draping the patient's vagina, introitus and proximal thighs using iodine and her infra-xiphoid abdomen using  chlorhexidine gluconate after she was further fashioned operative table using 3-inch tape with foam padding across the supraxiphoid chest.  Her arms were tucked at her side using gel rolls.  A test of steep Trendelenburg positioning was performed and  found to be suitably positioned.  Next, cystourethroscopy was performed with a 24-French injection scope with zero 30-degree lens.  Inspection of bladder revealed very large volume bladder tumor, some of which was necrotic.  Next, 2 mL of indocyanine  green dye was injected throughout the bladder and submucosal blebs with emphasis on placing  at area where junction of normal bladder and the tumor was.  A silicone Foley catheter was placed free to straight drain, and a  high-flow, low-pressure  pneumoperitoneum was obtained using Veress technique in the supraumbilical midline having passed the aspiration and drop test.  Next an 8 mm robotic camera port was placed in same location.  Laparoscopic examination of peritoneal cavity revealed some  very mild loose adhesions of the sigmoid to the left sidewall.  It was unremarkable.  Distal ports were placed as follows:  Right paramedian 8 mm robotic port, right far lateral 12 mm assistant port, right paramedian 15 mm assist port at the area of the  previously marked stomal site, left paramedian 8 mm robotic port, left far lateral 8 mm robotic port.  Robot was docked and passed electronic checks.  Attention was directed at development of the left retroperitoneum.  Incision was made lateral to the  descending colon from the iliac vessels superiorly for a distance of approximately 10 cm and inferiorly lateral to the left median umbilical ligament, thus creating a posterior peritoneal flap, which was then used to retract the colon and bowel medial.   Left ureter was identified as it coursed over the iliac vessels with marked vessel loop, dissected proximally for a distance of approximately 8 cm and then distally to the ureterovesical junction, which was doubly clipped and ligated with the proximal  clip being a dyed tag suture.  Frozen section negative for carcinoma.  Exquisite care was taken to avoid devascularization of the ureter which did not occur grossly.  The left ureter was then tucked out of the true pelvis.  Attention was directed at  left-sided lymphadenectomy.  The pelvis was inspected under near infrared fluorescence light.  There were no obvious sentinel lymph nodes within the left pelvis.  There were some sentinel lymphatic channels seen coursing into the right pelvis.  As such,  standard template lymphadenectomy was performed on the left side.  First to the left obturator group with boundaries being left external iliac  vein, pelvic sidewall, obturator nerve.  Lymphostasis achieved with cold clips.  SI labeled left obturator  lymph nodes.  Left obturator nerve was inspected following maneuvers and found to be uninjured.  Next left external iliac group was dissected free with the boundaries being left external iliac vein, pelvic sidewall, external iliac artery bifurcation,  lymph stage achieved with cold clips.  SI labeled left external iliac lymph nodes.  Next, fibrofatty tissue overlying the area of the common iliac artery from the aortic bifurcation today.  Bifurcation was dissected free, set aside, labeled left common  iliac lymph nodes.  There was a paucity of tissue within this pocket.  Finally, fibrofatty tissue remaining over the surface of the left internal iliac artery from the iliac bifurcation to the superior vesicle artery was dissected free, set aside,  labeled as such.  The left round ligament was transected using medial bucket handle and the left bladder wall and the uterus and vagina was swept away from the pelvic sidewall towards the area of the deep pelvis and urethra.  The endopelvic fascia was  swept away from the distal aspect of the vagina and bladder.  Attention was directed to the right side retroperitoneal dissection first.  The ileocecal junction was identified.  It was verified by the presence of appendix on the contralateral side and  the small bowel entering the cecum and an area of terminal ileum approximately 14 cm proximal to this was identified  and appeared to have sufficient mesenteric mobility to allow contact to anterior abdominal wall.  This was felt to be suitable for a  liter conduit formation.  A tagged silk clip was applied to the area of the epiploic fat with a single clip distal to this to mark proximal distal orientation.  Incision was then made lateral to the ascending colon from the iliac vessels superiorly a  distance of approximately 8 cm and then inferiorly lateral to the  right medial umbilical ligament and then a Y-shaped incision medial to this coursing along the iliac vessels towards the area of the aortic bifurcation.  The right ureter was encountered  as it coursed over the iliac vessels marked a vessel loop, dissected proximally to approximately 6 cm above the iliac crossing distal to the ureterovesical junction, which was doubly clipped and ligated with the proximal clip being a white tagged suture.   Frozen section negative for carcinoma.  The right ureter was tucked out of the true pelvis.  Attention was directed at right-sided pelvic lymphadenectomy.  Procedure performed in the exact same groups as per the left.  There were several sentinel lymph  nodes seen on the right side and these were denoted on the pathology requisition.  Next, the right round ligament was purposely transected using medial bucket handle and the right bladder wall lateral aspect and lateral vagina was swept away from the  pelvic sidewall towards the area of the endopelvic fascia, which was swept away as well towards the area of the urethra.  This completed the right lateral pelvic dissection.  Attention was directed at posterior dissection.  The bilateral gonadal vessels  were controlled using an extra-large Hem-o-Lok clip proximal and distal and the uterus was grasped, raised superiorly, anteriorly and the ovaries tucked anteriorly.  Sponge stick was created with ____ to identify the vaginal apex and cervix area which  was purposely incised at the area of the posterior cervix, tunical fornix.  Incision was made from this area of the lateral aspect of the vagina towards the area of the vaginal opening thus creating a posterior wall vaginal flap and this allowed the  anterior vaginal wall to separate and create a plane for stapling.  Attention was directed at vascular control of the uterus and bladder.  White load stapler was used x2 on each side, which provided excellent hemostatic control of  the ureter and bladder  pedicles taking exquisite care to avoid placing a staple line of future vaginal cuff.  This was performed bilaterally.  Next, anterior dissection was performed by incising between the medial umbilical ligaments developing the space of Retzius towards the  area of the dorsal vein and clitoris which was controlled using a green load stapler and final dissection was performed in the anterior plane transecting the urethra such that the meatus was included with the specimen.  This completely freed up the bladder plus urethra plus bilateral tubes and ovaries plus uterus plus cervix and anterior vaginal wall en  bloc.  Specimen was placed in an extra-large EndoCatch bag.  Digital rectal exam was performed with indicator glove and laparoscopic vision.  No evidence of rectal violation was noted.  Attention was directed at the reapproximation of the vaginal cuff.   A double-armed 2-0 V-Loc suture was used in a clamshell type fashion to redevelop the vaginal cuff, which was then palpated with indicator glove found to be completely intact.  The left ureter was then retroperitonealized to the right side by tunneling  just anterior to the aortic bifurcation via the right-sided retroperitoneal window.  The left ureter was brought to the right side and the left ureter, right ureter, ileocecal junction, conduit, area tag sutures were placed into a Hem-o-Lok clip and  grasped proximal to this using a self-locking grasper via the 15 mm port site.  Closed suction drain was brought through the previous left lateral most port site near the peritoneal cavity.  The specimen bag was brought through the previous left  paramedian robotic port site.  Robot was then undocked.  Specimen was retrieved by extending the previous camera port site inferiorly for a distance approximately 7 cm, incising the left side of the umbilicus.  The bladder, uterus en bloc specimen was  removed and set aside for pathology via  this site.  An Alexis wound protector was applied for retraction and the right and left ureter and area of distal ileum were brought through this marked with Babcock clamps and both ureters appeared to have  sufficient mobility as to the conduit bowel area.  Attention was directed at harvesting the bowel segment.  A segment of distal ileum 14 cm in length was taken in continuity using a green load stapler proximal and distal.  The mesentery was developed  using the white load stapler x2 distal x1 proximal, taking exquisite care to avoid devascularization of the conduit or anastomotic segments, which did not occur as verivied by mesentery palpation of arterial pulse and all aspects of this.  Conduit was  put into retroperitoneal orientation and bowel-to-bowel anastomosis was performed using 2 fires of the green load stapler around the antimesenteric border.  The acute angle anastomosis was bolstered using interrupted silk.  The free end was  reapproximated with running silk, followed by a second imbricating layer of running silk.  Mesenteric defect was controlled using interrupted silk to prevent internal hernia.  Bowel anastomosis was visibly viable and palpably patent for 2 fingers and  redelivered into the abdominal cavity.  Attention was directed at conduit formation.  The proximal staple line and conduit was excluded using running 3-0 Vicryl.  Distal staple line was removed.  Attention was directed at ureteroileal anastomosis.  A  segment of proximal conduit approximately 4 mm in length was excised in a V-shaped fashion and 4 mucosal everting sutures were applied of interrupted 4-0 Vicryl.  The right ureter was then spatulated and a final margin set aside labeled as ink.  Heel  stitch was applied and a red colored bander stent was placed.  A 26 cm anastomosis and ureteroenteric anastomosis was further developed using 2 separate running suture lines of running 4-0 Vicryl, which resulted in excellent  mucosal apposition.  The red  stent was then anchored to the level of the mid conduit using a single interrupted chromic suture to prevent inadvertent early displacement on the contralateral side of the proximal conduit.  The left ureter was anastomosed in a similar fashion using a  blue colored bander stent 26 cm anastomosis and the final left margin similarly set aside.  The conduit was delivered into the abdominal cavity.  Attention first to the formation of the stoma and stoma tunnel.  A quarter-sized diameter segment of skin  and subcutaneous tissue was excised at the previously marked stomal site / 53mm port site, and the fascia dilated to accommodate 2 surgeon's fingers.  Four separate interrupted 2-0 Vicryl fascial anchoring sutures were applied to prevent parastomal hernia  formation.  The conduit was brought through this and  anchored to the level of fascia x4.  The abdominal cavity was once again inspected.  Hemostasis was excellent.  Bowel anastomosis was patent.  Omentum was brought over the previous resection site and  was then closed with fascia using figure-of-eight PDS x6 followed by reapproximate Scarpa's with a running Vicryl.  All incision sites were infiltrated with dilute Marcaine and closed at the level of the skin using subcuticular Monocryl and Dermabond.   Attention was directed at final conduit maturation.  This was performed using 2-0 Vicryl x4 quadrants, rose budding type with intervening 3-0 Vicryl x2 in each quadrant.  The ostomy appliance was applied.  Procedure terminated.  The patient tolerated the  procedure well.  No immediate perioperative complications.  The patient was taken to the postanesthesia care in stable condition with plan for a step down admission.  Please note, first assistant Debbrah Alar was crucial for all portions of the procedure today.  She provided invaluable retraction, specimen manipulation, vascular stapling, vascular clipping and general first  assistance.  TN/NUANCE  D:02/01/2019 T:02/01/2019 JOB:[id number]

## 2019-02-04 NOTE — Progress Notes (Signed)
3 Days Post-Op Subjective: Patient recovering appropriately.  She has been ambulating.  She has had minimal nausea and is tolerating clears. Denies flatus, BM.  UOP 1.2 L JP drain 540 cc  Objective: Vital signs in last 24 hours: Temp:  [98.5 F (36.9 C)-99.2 F (37.3 C)] 98.5 F (36.9 C) (10/12 1255) Pulse Rate:  [82-85] 85 (10/12 1255) Resp:  [14-20] 14 (10/12 1300) BP: (126-148)/(88-91) 140/88 (10/12 1255) SpO2:  [91 %-93 %] 91 % (10/12 1255)  Intake/Output from previous day: 10/11 0701 - 10/12 0700 In: 2778.2 [I.V.:2676.5; IV Piggyback:101.8] Out: 2260 [Urine:1675; Drains:585] Intake/Output this shift: Total I/O In: -  Out: 960 [Urine:300; Emesis/NG output:560; Drains:100]  Physical Exam:  General: Alert and oriented CV: Normal rate Lungs: Normal work of breathing  Abdomen: Soft, appropriately tender, incisions clean dry intact.  JP in place with serosang fluid in bulb. GU: Stoma pink and healthy.  Urostomy draining pink-tinged urine with no blood clots.  2 bander stents present and visible in appropriate position within urostomy bag.  Lab Results: Recent Labs    02/01/19 1500 02/02/19 0314 02/03/19 0251  HGB 13.3 12.0 11.7*  HCT 44.1 38.2 36.8   BMET Recent Labs    02/02/19 0314 02/03/19 0251  NA 136 141  K 3.8 3.1*  CL 103 105  CO2 22 27  GLUCOSE 162* 145*  BUN 9 6  CREATININE 0.73 0.66  CALCIUM 8.1* 8.2*     Studies/Results: No results found.  Assessment/Plan: 57 year old female with mild COPD and concurrent lung cancer (treatment pending surgery for bladder cancer) status post robot-assisted laparoscopic radical cystectomy with ileal conduit on 02/01/2019.  Is progressing appropriately, tolerating clears.  Awaiting return of bowel function.  Has been mobilizing with physical therapy.  Started ostomy teaching.  Continue to encourage ambulation.     LOS: 4 days   Haskel Schroeder 02/04/2019, 2:10 PM

## 2019-02-05 LAB — BASIC METABOLIC PANEL
Anion gap: 8 (ref 5–15)
BUN: 5 mg/dL — ABNORMAL LOW (ref 6–20)
CO2: 32 mmol/L (ref 22–32)
Calcium: 7.9 mg/dL — ABNORMAL LOW (ref 8.9–10.3)
Chloride: 101 mmol/L (ref 98–111)
Creatinine, Ser: 0.58 mg/dL (ref 0.44–1.00)
GFR calc Af Amer: >60 mL/min (ref >60)
GFR calc non Af Amer: >60 mL/min (ref >60)
Glucose, Bld: 132 mg/dL — ABNORMAL HIGH (ref 70–99)
Potassium: 2.6 mmol/L — CL (ref 3.5–5.1)
Sodium: 141 mmol/L (ref 135–145)

## 2019-02-05 LAB — HEMOGLOBIN AND HEMATOCRIT, BLOOD
HCT: 37.1 % (ref 36.0–46.0)
Hemoglobin: 11.8 g/dL — ABNORMAL LOW (ref 12.0–15.0)

## 2019-02-05 MED ORDER — LIDOCAINE 5 % EX PTCH
1.0000 | MEDICATED_PATCH | CUTANEOUS | Status: DC
  Administered 2019-02-05 – 2019-02-06 (×2): 1 via TRANSDERMAL
  Filled 2019-02-05 (×3): qty 1

## 2019-02-05 MED ORDER — POTASSIUM CHLORIDE 20 MEQ PO PACK
40.0000 meq | PACK | Freq: Once | ORAL | Status: DC
  Filled 2019-02-05: qty 2

## 2019-02-05 MED ORDER — POTASSIUM CHLORIDE 10 MEQ/100ML IV SOLN
10.0000 meq | INTRAVENOUS | Status: AC
  Administered 2019-02-05 (×2): 10 meq via INTRAVENOUS
  Filled 2019-02-05 (×2): qty 100

## 2019-02-05 MED ORDER — POTASSIUM CHLORIDE 10 MEQ/100ML IV SOLN
10.0000 meq | INTRAVENOUS | Status: AC
  Administered 2019-02-05 (×4): 10 meq via INTRAVENOUS
  Filled 2019-02-05 (×4): qty 100

## 2019-02-05 NOTE — Progress Notes (Deleted)
PT Cancellation Note  Patient Details Name: Lauren Salazar MRN: [id number] DOB: Jun 01, 1960   Cancelled Treatment:     Pt refused PT this afternoon due to pain and feeling tired. Pt stated she did "a lot today". Will attempt PT again another day.   Lelon Mast 02/05/2019, 2:59 PM

## 2019-02-05 NOTE — Progress Notes (Addendum)
4 Days Post-Op Subjective: Patient recovering appropriately.  She has been ambulating.  She has had minimal nausea and is tolerating clears. Denies flatus, BM. Continues to complain of moderate lower abdominal pain; exam not concerning.   UOP 2.3 L JP drain 360 cc  Objective: Vital signs in last 24 hours: Temp:  [98.2 F (36.8 C)-98.5 F (36.9 C)] 98.2 F (36.8 C) (10/13 0549) Pulse Rate:  [83-85] 83 (10/13 0549) Resp:  [14-18] 18 (10/13 0549) BP: (126-140)/(88-89) 139/89 (10/13 0549) SpO2:  [88 %-91 %] 91 % (10/13 0549)  Intake/Output from previous day: 10/12 0701 - 10/13 0700 In: 2166.7 [P.O.:180; I.V.:1986.7] Out: 3245 [Urine:2325; Emesis/NG output:560; Drains:360] Intake/Output this shift: Total I/O In: -  Out: 550 [Urine:450; Drains:100]  Physical Exam:  General: Alert and oriented CV: Normal rate Lungs: Normal work of breathing  Abdomen: Soft, appropriately tender, incisions clean dry intact.  JP in place with serosang fluid in bulb. GU: Stoma pink and healthy.  Urostomy draining pink-tinged urine with no blood clots.  2 bander stents present and visible in appropriate position within urostomy bag.  Lab Results: Recent Labs    02/03/19 0251 02/05/19 0610  HGB 11.7* 11.8*  HCT 36.8 37.1   BMET Recent Labs    02/03/19 0251 02/05/19 0610  NA 141 141  K 3.1* 2.6*  CL 105 101  CO2 27 32  GLUCOSE 145* 132*  BUN 6 5*  CREATININE 0.66 0.58  CALCIUM 8.2* 7.9*     Studies/Results: No results found.  Assessment/Plan: 58 year old female with mild COPD and concurrent lung cancer (treatment pending surgery for bladder cancer) status post robot-assisted laparoscopic radical cystectomy with ileal conduit on 02/01/2019.  Is progressing appropriately, tolerating clears.  Awaiting return of bowel function.  Will start soft diet today. Will try lidocaine patches to assist with pain.  Has been mobilizing with physical therapy.  Started ostomy teaching.  Continue to  encourage ambulation.  Replacing K.     LOS: 5 days   Haskel Schroeder 02/05/2019, 11:24 AM

## 2019-02-05 NOTE — Progress Notes (Signed)
PT Cancellation Note  Patient Details Name: Lauren Salazar MRN: [id number] DOB: July 02, 1960   Cancelled Treatment:    Reason Eval/Treat Not Completed: Pain limiting ability to participate;Fatigue/lethargy limiting ability to participate   Lelon Mast 02/05/2019, 3:03 PM

## 2019-02-05 NOTE — Plan of Care (Signed)

## 2019-02-05 NOTE — Progress Notes (Signed)
CRITICAL VALUE ALERT  Critical Value:  K 2.6  Date & Time Notied:  02/05/19 at 0700  Provider Notified: NP Schorr  Orders Received/Actions taken: Awaiting further actions.

## 2019-02-05 NOTE — Progress Notes (Signed)
MD Gloriann Loan with urology made aware of pt potassium of 2.6. New orders received. Will carry out new orders and continue to monitor patient.

## 2019-02-06 LAB — BASIC METABOLIC PANEL
Anion gap: 9 (ref 5–15)
BUN: 7 mg/dL (ref 6–20)
CO2: 32 mmol/L (ref 22–32)
Calcium: 8.1 mg/dL — ABNORMAL LOW (ref 8.9–10.3)
Chloride: 102 mmol/L (ref 98–111)
Creatinine, Ser: 0.55 mg/dL (ref 0.44–1.00)
GFR calc Af Amer: >60 mL/min (ref >60)
GFR calc non Af Amer: >60 mL/min (ref >60)
Glucose, Bld: 111 mg/dL — ABNORMAL HIGH (ref 70–99)
Potassium: 3 mmol/L — ABNORMAL LOW (ref 3.5–5.1)
Sodium: 143 mmol/L (ref 135–145)

## 2019-02-06 LAB — SURGICAL PATHOLOGY

## 2019-02-06 LAB — HEMOGLOBIN AND HEMATOCRIT, BLOOD
HCT: 35.9 % — ABNORMAL LOW (ref 36.0–46.0)
Hemoglobin: 11.6 g/dL — ABNORMAL LOW (ref 12.0–15.0)

## 2019-02-06 LAB — CREATININE, FLUID (PLEURAL, PERITONEAL, JP DRAINAGE): Creat, Fluid: 0.6 mg/dL

## 2019-02-06 MED ORDER — CELECOXIB 100 MG PO CAPS
100.0000 mg | ORAL_CAPSULE | Freq: Two times a day (BID) | ORAL | Status: DC
  Administered 2019-02-06 – 2019-02-07 (×3): 100 mg via ORAL
  Filled 2019-02-06 (×4): qty 1

## 2019-02-06 MED ORDER — HYDROMORPHONE HCL 1 MG/ML IJ SOLN: 0.5000 mg | INTRAMUSCULAR | Status: DC | PRN

## 2019-02-06 MED ORDER — ACETAMINOPHEN 500 MG PO TABS
1000.0000 mg | ORAL_TABLET | Freq: Four times a day (QID) | ORAL | Status: DC
  Administered 2019-02-06 – 2019-02-07 (×4): 1000 mg via ORAL
  Filled 2019-02-06 (×5): qty 2

## 2019-02-06 NOTE — Progress Notes (Signed)
5 Days Post-Op Subjective: Patient recovering appropriately.  She has been ambulating.  She has had minimal nausea and is tolerating soft diet. Passed flatus and BM x2 yesterday, none overnight. Continues to complain of moderate lower abdominal pain; exam not concerning.   UOP adeqaute JP drain 390 cc  Objective: Vital signs in last 24 hours: Temp:  [98.4 F (36.9 C)-98.9 F (37.2 C)] 98.4 F (36.9 C) (10/14 1234) Pulse Rate:  [81-89] 81 (10/14 1234) Resp:  [17-20] 17 (10/14 1234) BP: (140-154)/(83-88) 149/88 (10/14 1234) SpO2:  [91 %-95 %] 95 % (10/14 1234)  Intake/Output from previous day: 10/13 0701 - 10/14 0700 In: 120 [P.O.:120] Out: 2640 [Urine:2250; Drains:390] Intake/Output this shift: Total I/O In: 120 [P.O.:120] Out: 60 [Drains:60]  Physical Exam:  General: Alert and oriented CV: Normal rate Lungs: Normal work of breathing  Abdomen: Soft, appropriately tender, incisions clean dry intact.  JP in place with serosang fluid in bulb. GU: Stoma pink and healthy.  Urostomy draining pink-tinged urine with no blood clots.  2 bander stents present and visible in appropriate position within urostomy bag.  Lab Results: Recent Labs    02/05/19 0610 02/06/19 0542  HGB 11.8* 11.6*  HCT 37.1 35.9*   BMET Recent Labs    02/05/19 0610 02/06/19 0542  NA 141 143  K 2.6* 3.0*  CL 101 102  CO2 32 32  GLUCOSE 132* 111*  BUN 5* 7  CREATININE 0.58 0.55  CALCIUM 7.9* 8.1*     Studies/Results: No results found.  Assessment/Plan: 58 year old female with mild COPD and concurrent lung cancer (treatment pending surgery for bladder cancer) status post robot-assisted laparoscopic radical cystectomy with ileal conduit on 02/01/2019.  Is progressing appropriately, tolerating soft diet. ROBF on 10/13 with BM x2. Maximizing pain regimen today with scheduled tylenol, prn oxycodone, ice, lidocaine, and starting celebrex with IV Dilaudid for breakthrough only. Needs to continue to work  with PT and ostomy nurse. Will check JP Cr today. If does well, possible discharge tomorrow.     LOS: 6 days   Haskel Schroeder 02/06/2019, 3:07 PM

## 2019-02-06 NOTE — Progress Notes (Signed)
Physical Therapy Treatment Patient Details Name: Lauren Salazar MRN: [id number] DOB: 1961/02/24 Today's Date: 02/06/2019    History of Present Illness 58 yo female admitted with bladder cancer. S/P lap cystectomy, ileal conduit, hysterectomy bil salpingo-oophorectomy 10/9. Hx of COPD, lung ca.    PT Comments    Progressing with mobility. Moderate abd pain with bed mobility.    Follow Up Recommendations  Home health PT;Supervision - Intermittent     Equipment Recommendations  None recommended by PT    Recommendations for Other Services       Precautions / Restrictions Precautions Precautions: Fall Precaution Comments: L jp drain; urostomy Restrictions Weight Bearing Restrictions: No    Mobility  Bed Mobility Overal bed mobility: Needs Assistance Bed Mobility: Supine to Sit;Sit to Supine     Supine to sit: Supervision;HOB elevated Sit to supine: Supervision;HOB elevated   General bed mobility comments: increased time, pt utilized bed rail to self assist  Transfers Overall transfer level: Needs assistance Equipment used: Rolling walker (2 wheeled) Transfers: Sit to/from Stand Sit to Stand: Supervision         General transfer comment: for safety.  Ambulation/Gait Ambulation/Gait assistance: Min guard Gait Distance (Feet): 125 Feet Assistive device: Rolling walker (2 wheeled) Gait Pattern/deviations: Step-through pattern;Decreased stride length     General Gait Details: for safety. slow but steady with RW.   Stairs             Wheelchair Mobility    Modified Rankin (Stroke Patients Only)       Balance Overall balance assessment: Needs assistance         Standing balance support: Bilateral upper extremity supported Standing balance-Leahy Scale: Poor                              Cognition Arousal/Alertness: Awake/alert Behavior During Therapy: WFL for tasks assessed/performed Overall Cognitive Status: Within  Functional Limits for tasks assessed                                        Exercises      General Comments        Pertinent Vitals/Pain Pain Assessment: 0-10 Pain Score: 6  Pain Location: abdomen Pain Descriptors / Indicators: Sore;Discomfort Pain Intervention(s): Monitored during session;Repositioned    Home Living                      Prior Function            PT Goals (current goals can now be found in the care plan section) Progress towards PT goals: Progressing toward goals    Frequency    Min 3X/week      PT Plan Current plan remains appropriate    Co-evaluation              AM-PAC PT "6 Clicks" Mobility   Outcome Measure  Help needed turning from your back to your side while in a flat bed without using bedrails?: A Little Help needed moving from lying on your back to sitting on the side of a flat bed without using bedrails?: A Little Help needed moving to and from a bed to a chair (including a wheelchair)?: A Little Help needed standing up from a chair using your arms (e.g., wheelchair or bedside chair)?: A Little Help needed to walk in hospital  room?: A Little Help needed climbing 3-5 steps with a railing? : A Little 6 Click Score: 18    End of Session   Activity Tolerance: Patient tolerated treatment well Patient left: in bed;with call bell/phone within reach   PT Visit Diagnosis: Unsteadiness on feet (R26.81);Muscle weakness (generalized) (M62.81);Pain Pain - part of body: (abdomen)     Time: 7034-0352 PT Time Calculation (min) (ACUTE ONLY): 11 min  Charges:  $Gait Training: 8-22 mins             Weston Anna, PT Acute Rehabilitation Services Pager: [phone] Office: [phone]

## 2019-02-06 NOTE — Consult Note (Signed)
Lauren Salazar Nurse ostomy follow up Patient receiving care in [place]. Patient did a complete urostomy pouch change practically independently.  I had to provide very little focused assistance.  She has two leg bags, 5 barrier rings, an extra bedside drain bag, 4 skin barriers and one extra pouch.  The unit secretary has requested an additional 3 pouches and I have asked that the pouches be placed in the patient room when they arrive.  I have also given her an extra drain bag adapter.  She has the [employer], and we reviewed the majority of the contents. The stoma measures 1.25 inches, round, stents intact and draining. All sutures intact. Peristomal skin normal color and texture.  A barrier ring was used. We talked about the frequency of changing the pouching system as well as the frequency of emptying the pouch.  Val Riles, RN, MSN, CWOCN, CNS-BC, pager [phone]

## 2019-02-07 MED ORDER — OXYCODONE-ACETAMINOPHEN 5-325 MG PO TABS: 1.0000 | ORAL_TABLET | Freq: Four times a day (QID) | ORAL | 0 refills | Status: DC | PRN

## 2019-02-07 NOTE — Plan of Care (Signed)
  Problem: Education: Goal: Knowledge of General Education information will improve Description: Including pain rating scale, medication(s)/side effects and non-pharmacologic comfort measures Outcome: Progressing   Problem: Health Behavior/Discharge Planning: Goal: Ability to manage health-related needs will improve Outcome: Progressing   Problem: Clinical Measurements: Goal: Ability to maintain clinical measurements within normal limits will improve Outcome: Progressing Goal: Will remain free from infection Outcome: Progressing Goal: Diagnostic test results will improve Outcome: Progressing Goal: Respiratory complications will improve Outcome: Progressing Goal: Cardiovascular complication will be avoided Outcome: Progressing   Problem: Activity: Goal: Risk for activity intolerance will decrease Outcome: Progressing   Problem: Nutrition: Goal: Adequate nutrition will be maintained Outcome: Progressing   Problem: Coping: Goal: Level of anxiety will decrease Outcome: Progressing   Problem: Elimination: Goal: Will not experience complications related to bowel motility Outcome: Progressing Goal: Will not experience complications related to urinary retention Outcome: Progressing   Problem: Pain Managment: Goal: General experience of comfort will improve Outcome: Progressing   Problem: Safety: Goal: Ability to remain free from injury will improve Outcome: Progressing   Problem: Skin Integrity: Goal: Risk for impaired skin integrity will decrease Outcome: Progressing   Problem: Education: Goal: Knowledge of ostomy care will improve Outcome: Progressing Goal: Understanding of discharge needs will improve Outcome: Progressing   Problem: Bowel/Gastric/Urinary: Goal: Gastrointestinal status for postoperative course will improve Outcome: Progressing   Problem: Coping: Goal: Coping ability will improve Outcome: Progressing   Problem: Fluid Volume: Goal: Ability to  achieve a balanced intake and output will improve Outcome: Progressing   Problem: Health Behavior/Discharge Planning: Goal: Ability to manage health-related needs will improve Outcome: Progressing   Problem: Nutrition: Goal: Will attain and maintain optimal nutritional status will improve Outcome: Progressing   Problem: Clinical Measurements: Goal: Postoperative complications will be avoided or minimized Outcome: Progressing   Problem: Skin Integrity: Goal: Will show signs of wound healing Outcome: Progressing Goal: Risk for impaired skin integrity will decrease Outcome: Progressing

## 2019-02-07 NOTE — TOC Transition Note (Signed)
Transition of Care [facility]) - CM/SW Discharge Note   Patient Details  Name: Lauren Salazar MRN: [id number] Date of Birth: 12/14/1960  [facility]) CM/SW Contact:  Dessa Phi, RN Phone Number: 02/07/2019, 1:42 PM   Clinical Narrative:   D/c home w/[facility]. No further CM needs.          Patient Goals and CMS Choice        Discharge Placement                       Discharge Plan and Services                                     Social Determinants of Health (SDOH) Interventions     Readmission Risk Interventions No flowsheet data found.

## 2019-02-07 NOTE — Discharge Summary (Addendum)
Date of admission: 01/31/2019  Date of discharge: 02/07/2019  Admission diagnosis: Bladder cancer  Discharge diagnosis: Bladder cancer  Secondary diagnoses: None  History and Physical: For full details, please see admission history and physical. Briefly, Lauren Salazar is a 58 y.o. year old patient with bladder cancer for which surgical intervention was indicated.   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  Patient progressed appropriately postoperatively.  She initially had some difficulty with pain control but this improved with multimodal pain regimen including Tylenol, oxycodone, Celebrex, lidocaine, and ice, and she had her pain well controlled on p.o. medications prior to discharge.  Her diet was advanced slowly, and on discharge she was passing flatus, having bowel movements, and tolerating a regular diet.  She worked with Education officer, museum for ostomy teaching.  She also mobilized well with physical therapy.  JP creatinine was checked and was negative for urine leak, and JP drain was removed prior to discharge.  Her urostomy had good urine output.  She is being discharged with bander stents in appropriate place within urostomy.  She will follow-up with [place] outpatient for postoperative check in bander ureteral stent removal.  Physical exam: Today's Vitals   02/06/19 2150 02/07/19 0007 02/07/19 0458 02/07/19 0528  BP: 131/89   136/84  Pulse: 79   77  Resp: 18   18  Temp: 99 F (37.2 C)   98.7 F (37.1 C)  TempSrc: Oral   Oral  SpO2: 94%   94%  Weight:      Height:      PainSc:  5  5     Body mass index is 24.21 kg/m.  General: Alert and oriented CV: Normal rate Lungs: Normal work of  breathing  Abdomen: Soft, appropriately tender, incisions clean dry intact.  JP site clean, JP drain removed prior to discharge.  GU: Stoma pink and healthy.  Urostomy draining yellow urine with no blood clots.  2 bander stents present and visible in appropriate position within urostomy bag.  Laboratory values:  Recent Labs    02/05/19 0610 02/06/19 0542  HGB 11.8* 11.6*  HCT 37.1 35.9*   Recent Labs    02/05/19 0610 02/06/19 0542  CREATININE 0.58 0.55    Disposition: Home   Discharge medications:  Allergies as of 02/07/2019   No Known Allergies     Medication List    TAKE these medications   acetaminophen 500 MG tablet Commonly known as: TYLENOL Take 1,000 mg by mouth every 8 (eight) hours as needed (pain).   ibuprofen 200 MG tablet Commonly known as: ADVIL Take 200 mg by mouth every 6 (six) hours as needed for moderate pain.   nicotine 21 mg/24hr patch Commonly known as: NICODERM CQ - dosed in mg/24 hours Place 21 mg onto the skin daily.       Followup:  Follow-up Information    Alexis Frock, MD On 02/25/2019.   Specialty: Urology Why: at 1:30 PM for MD visit Contact information: [place]  [id number] [phone]          I have seen and examined the patient on day of discharge, discused final pathology results (favorable), and agree she has met discharge crieria.

## 2019-02-07 NOTE — TOC Transition Note (Signed)
Transition of Care [facility]) - CM/SW Discharge Note   Patient Details  Name: Lauren Salazar MRN: [id number] Date of Birth: 09-11-1960  [facility]) CM/SW Contact:  Dessa Phi, RN Phone Number: 02/07/2019, 11:48 AM   Clinical Narrative:  D/c home w/HHPT-[employer] rep for [facility] will assess for [facility]. Await HHPT order-Nsg aware. Patient has rw. Will d/c home & stay @ address:[address] [place] [address] [phone]-Allen Sampson(friend).           Patient Goals and CMS Choice        Discharge Placement                       Discharge Plan and Services                                     Social Determinants of Health (SDOH) Interventions     Readmission Risk Interventions No flowsheet data found.

## 2019-02-21 ENCOUNTER — Telehealth: Payer: Self-pay | Admitting: Oncology

## 2019-02-21 NOTE — Telephone Encounter (Signed)
Returned patient's phone call regarding rescheduling an appointment, left a voicemail. 

## 2019-02-22 ENCOUNTER — Inpatient Hospital Stay: Payer: MEDICAID | Admitting: Oncology

## 2019-02-22 ENCOUNTER — Inpatient Hospital Stay: Payer: MEDICAID

## 2019-02-22 ENCOUNTER — Telehealth: Payer: Self-pay | Admitting: Oncology

## 2019-02-22 NOTE — Telephone Encounter (Signed)
Patient called to reschedule missed 10/30 appointment, per patient's request appointment has been rescheduled to 11/11.

## 2019-03-05 ENCOUNTER — Telehealth: Payer: Self-pay

## 2019-03-05 NOTE — Telephone Encounter (Signed)
Received call from prescreening staff requesting patient be called by Dr. Hazeline Junker nurse due to list of concerning symptoms. Patient was called and stated she has had SOB, chills, and cough for 2 days. Patient stated she almost called 911 yesterday due to increased SOB to the point where she could not talk. Patient also stated her eyes are tender and reports pain in her abdomen. Patient states she does not have a thermometer so she is unable to state if she is running a fever.  Patient reports weakness and is unable to drive "I am too shaky". Dr. Alen Blew notified. Per Dr. Alen Blew, patient needs to be evaluated in the ED and can call 911. Patient called and informed to go to ED for evaluation and to call 911 if she is unable to drive or SOB becomes worse. Patient verbalized understanding and stated she was going to call daughter to have her take her to ED.

## 2019-03-05 NOTE — Telephone Encounter (Signed)
Called to check on patient to see if she had gone to the ED.  Patient did not answer phone.  Voicemail left for patient to call office to update [place] on her status.

## 2019-03-06 ENCOUNTER — Inpatient Hospital Stay: Payer: Medicaid Other

## 2019-03-06 ENCOUNTER — Inpatient Hospital Stay: Payer: Medicaid Other | Admitting: Oncology

## 2019-03-06 ENCOUNTER — Telehealth: Payer: Self-pay | Admitting: Oncology

## 2019-03-06 NOTE — Telephone Encounter (Signed)
R/s appt per 11/11 sch message - pt aware of appt date and time

## 2019-03-14 ENCOUNTER — Inpatient Hospital Stay: Payer: Medicaid Other | Attending: Oncology

## 2019-03-14 ENCOUNTER — Inpatient Hospital Stay (HOSPITAL_BASED_OUTPATIENT_CLINIC_OR_DEPARTMENT_OTHER): Payer: Medicaid Other | Admitting: Oncology

## 2019-03-14 ENCOUNTER — Other Ambulatory Visit: Payer: Self-pay

## 2019-03-14 DIAGNOSIS — Z79899 Other long term (current) drug therapy: Secondary | ICD-10-CM | POA: Diagnosis not present

## 2019-03-14 DIAGNOSIS — R918 Other nonspecific abnormal finding of lung field: Secondary | ICD-10-CM

## 2019-03-14 DIAGNOSIS — C679 Malignant neoplasm of bladder, unspecified: Secondary | ICD-10-CM | POA: Insufficient documentation

## 2019-03-14 DIAGNOSIS — C3490 Malignant neoplasm of unspecified part of unspecified bronchus or lung: Secondary | ICD-10-CM | POA: Insufficient documentation

## 2019-03-14 LAB — CBC WITH DIFFERENTIAL (CANCER CENTER ONLY)
Abs Immature Granulocytes: 0.1 10*3/uL — ABNORMAL HIGH (ref 0.00–0.07)
Basophils Absolute: 0.1 10*3/uL (ref 0.0–0.1)
Basophils Relative: 1 %
Eosinophils Absolute: 0.1 10*3/uL (ref 0.0–0.5)
Eosinophils Relative: 1 %
HCT: 37.6 % (ref 36.0–46.0)
Hemoglobin: 11.8 g/dL — ABNORMAL LOW (ref 12.0–15.0)
Immature Granulocytes: 1 %
Lymphocytes Relative: 20 %
Lymphs Abs: 2.4 10*3/uL (ref 0.7–4.0)
MCH: 29.2 pg (ref 26.0–34.0)
MCHC: 31.4 g/dL (ref 30.0–36.0)
MCV: 93.1 fL (ref 80.0–100.0)
Monocytes Absolute: 0.8 10*3/uL (ref 0.1–1.0)
Monocytes Relative: 6 %
Neutro Abs: 8.3 10*3/uL — ABNORMAL HIGH (ref 1.7–7.7)
Neutrophils Relative %: 71 %
Platelet Count: 638 10*3/uL — ABNORMAL HIGH (ref 150–400)
RBC: 4.04 MIL/uL (ref 3.87–5.11)
RDW: 15.5 % (ref 11.5–15.5)
WBC Count: 11.7 10*3/uL — ABNORMAL HIGH (ref 4.0–10.5)
nRBC: 0 % (ref 0.0–0.2)

## 2019-03-14 LAB — CMP (CANCER CENTER ONLY)
ALT: 19 U/L (ref 0–44)
AST: 16 U/L (ref 15–41)
Albumin: 3.1 g/dL — ABNORMAL LOW (ref 3.5–5.0)
Alkaline Phosphatase: 95 U/L (ref 38–126)
Anion gap: 10 (ref 5–15)
BUN: 15 mg/dL (ref 6–20)
CO2: 27 mmol/L (ref 22–32)
Calcium: 8.7 mg/dL — ABNORMAL LOW (ref 8.9–10.3)
Chloride: 104 mmol/L (ref 98–111)
Creatinine: 0.91 mg/dL (ref 0.44–1.00)
GFR, Est AFR Am: >60 mL/min (ref >60)
GFR, Estimated: >60 mL/min (ref >60)
Glucose, Bld: 117 mg/dL — ABNORMAL HIGH (ref 70–99)
Potassium: 4.3 mmol/L (ref 3.5–5.1)
Sodium: 141 mmol/L (ref 135–145)
Total Bilirubin: <0.2 mg/dL — ABNORMAL LOW (ref 0.3–1.2)
Total Protein: 6.6 g/dL (ref 6.5–8.1)

## 2019-03-14 MED ORDER — PROCHLORPERAZINE MALEATE 10 MG PO TABS: 10.0000 mg | ORAL_TABLET | Freq: Four times a day (QID) | ORAL | 0 refills | Status: DC | PRN

## 2019-03-14 NOTE — Progress Notes (Signed)
Hematology and Oncology Follow Up Visit  Lauren Salazar [id number] 1960-07-01 58 y.o. 03/14/2019 4:15 PM College, [facility] @ [employer], [facility]   Principle Diagnosis: 58 year old woman with:  1.  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  She was found to have squamous cell carcinoma that is poorly differentiated with the final pathology showing T4AN0 disease.  2.  Stage IIIa adenocarcinoma of the lung diagnosed in August 2020.  She was found to have hypermetabolic right lower lobe mass as well as hypermetabolic right hilar and right lower paratracheal lymph node metastasis.  Prior Therapy:   She is status post robotic assisted laparoscopic cystectomy and pelvic exoneration including hysterectomy, bilateral salpingo-oophorectomy and ileoconduit diversion completed on February 01, 2019.  Current therapy: Under consideration to start therapy for her lung cancer.  Interim History: [name] returns today for a follow-up visit.  Since the last visit, she underwent radical cystectomy and pelvic exoneration completed by Dr. Tresa Moore and continues to recover slowly.  She continues to have issues with pain although is improving slowly.  She is able to eat better and has regained a lot of her appetite.  She reports no nausea or vomiting.  She denies any respiratory complaints at this time.  Her performance status is improving she is able to drive today.   She denied any alteration mental status, neuropathy, confusion or dizziness.  Denies any headaches or lethargy.  Denies any night sweats, weight loss or changes in appetite.  Denied orthopnea, dyspnea on exertion or chest discomfort.  Denies shortness of breath, difficulty breathing hemoptysis or cough.  Denies any abdominal distention, nausea, early satiety or dyspepsia.  Denies any hematuria, frequency, dysuria or nocturia.  Denies any skin irritation, dryness or rash.  Denies any ecchymosis or  petechiae.  Denies any lymphadenopathy or clotting.  Denies any heat or cold intolerance.  Denies any anxiety or depression.  Remaining review of system is negative.     Medications: I have reviewed the patient's current medications.  Current Outpatient Medications  Medication Sig Dispense Refill  . acetaminophen (TYLENOL) 500 MG tablet Take 1,000 mg by mouth every 8 (eight) hours as needed (pain).     Marland Kitchen ibuprofen (ADVIL) 200 MG tablet Take 200 mg by mouth every 6 (six) hours as needed for moderate pain.    . nicotine (NICODERM CQ - DOSED IN MG/24 HOURS) 21 mg/24hr patch Place 21 mg onto the skin daily.    Marland Kitchen oxyCODONE-acetaminophen (PERCOCET) 5-325 MG tablet Take 1-2 tablets by mouth every 6 (six) hours as needed for moderate pain or severe pain. Post-Operatively 20 tablet 0   No current facility-administered medications for this visit.      Allergies: No Known Allergies  Past Medical History, Surgical history, Social history, and Family History were reviewed and updated.    Physical Exam: Blood pressure 121/86, pulse 86, temperature 98.9 F (37.2 C), temperature source Temporal, resp. rate 18, height 5\' 6"  (1.676 m), weight 142 lb 11.2 oz (64.7 kg), SpO2 99 %. ECOG: 1   General appearance: Comfortable appearing without any discomfort Head: Normocephalic without any trauma Oropharynx: Mucous membranes are moist and pink without any thrush or ulcers. Eyes: Pupils are equal and round reactive to light. Lymph nodes: No cervical, supraclavicular, inguinal or axillary lymphadenopathy.   Heart:regular rate and rhythm.  S1 and S2 without leg edema. Lung: Clear without any rhonchi or wheezes.  No dullness to percussion. Abdomin: Soft, nontender, nondistended with good bowel  sounds.  No hepatosplenomegaly. Musculoskeletal: No joint deformity or effusion.  Full range of motion noted. Neurological: No deficits noted on motor, sensory and deep tendon reflex exam. Skin: No petechial rash or  dryness.  Appeared moist.  Ostomy site appeared to without any abnormalities.    Lab Results: Lab Results  Component Value Date   WBC 11.7 (H) 03/14/2019   HGB 11.8 (L) 03/14/2019   HCT 37.6 03/14/2019   MCV 93.1 03/14/2019   PLT 638 (H) 03/14/2019     Chemistry      Component Value Date/Time   NA 141 03/14/2019 1512   K 4.3 03/14/2019 1512   CL 104 03/14/2019 1512   CO2 27 03/14/2019 1512   BUN 15 03/14/2019 1512   CREATININE 0.91 03/14/2019 1512      Component Value Date/Time   CALCIUM 8.7 (L) 03/14/2019 1512   ALKPHOS 95 03/14/2019 1512   AST 16 03/14/2019 1512   ALT 19 03/14/2019 1512   BILITOT <0.2 (L) 03/14/2019 1512       Radiological Studies: IMPRESSION: 1. Hypermetabolic mass in the RIGHT lower lobe and hypermetabolic nodule RIGHT upper lobe. Differential considerations would include primary bronchogenic carcinoma with metastasis versus bladder cancer metastasis. 2. Hypermetabolic RIGHT hilar and RIGHT lower paratracheal nodal metastasis. Pattern of nodal metastasis suggests bronchogenic carcinoma. 3. Bilateral benign adrenal adenomas.   Impression and Plan:   58 year old woman with:  1.  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  She is status post radical cystectomy and pelvic exoneration with a final pathology revealing a T4N0 disease.  The natural course of this disease as well as additional therapy was discussed.  At this time I recommended continued active surveillance and use salvage chemotherapy if needed in the future.  Given her diagnosis of lung cancer she has competing comorbidities and she will receive definitive therapy for her lung cancer which could also impact bladder cancer.  2.  Stage IIIA adenocarcinoma she presented with a right lower lobe mass in addition to a upper lobe involvement with a small nodule as well as lymph node involvement in the right hilar and right paratracheal lymph nodes.  Treatment options  of this disease was reviewed and her case was discussed in the thoracic tumor board.  Concomitant radiation and chemotherapy remains the standard of care with potentially immunotherapy maintenance upon completing course of therapy.  Complication associated with this therapy including nausea, vomiting, myelosuppression, neutropenia and neutropenic sepsis.  After discussion today, she would like to proceed with this approach at this time.  I will make an appropriate referral to radiation oncology as well as arrange for chemotherapy education class.  Chemotherapy treatment will start once her radiation schedule is set up.  Chemotherapy will be given utilizing paclitaxel at 45 mg per metered square weekly and carboplatin AUC of 2 weekly with radiation.  3.  Antiemetics: Prescription for Compazine will be made available to her.  4.  IV access: Peripheral veins will be used for the time being.  Port-A-Cath insertion may be considered if access becomes an issue.  5.  Follow-up: Will be determined pending the start of her chemotherapy.   30  minutes was spent with the patient face-to-face today.  More than 50% of time was spent on reviewing her disease status, treatment options and complications related to therapy.   Zola Button, MD 11/19/20204:15 PM

## 2019-03-14 NOTE — Progress Notes (Signed)
START ON PATHWAY REGIMEN - Non-Small Cell Lung     Administer weekly:     Paclitaxel      Carboplatin   **Always confirm dose/schedule in your pharmacy ordering system**  Patient Characteristics: Stage III - Unresectable, PS = 0, 1 AJCC T Category: TX Current Disease Status: No Distant Mets or Local Recurrence AJCC N Category: NX AJCC M Category: M0 AJCC 8 Stage Grouping: IIIA ECOG Performance Status: 1 Intent of Therapy: Curative Intent, Discussed with Patient

## 2019-03-15 ENCOUNTER — Telehealth: Payer: Self-pay | Admitting: Radiation Oncology

## 2019-03-15 ENCOUNTER — Telehealth: Payer: Self-pay | Admitting: Oncology

## 2019-03-15 NOTE — Telephone Encounter (Signed)
Scheduled appt per 11/19 los.  Left a vm of the appt date and time.

## 2019-03-15 NOTE — Telephone Encounter (Signed)
New Message:    LVM for patient to return call to schedule appt from referral received

## 2019-03-19 ENCOUNTER — Inpatient Hospital Stay: Payer: Medicaid Other

## 2019-03-19 ENCOUNTER — Telehealth: Payer: Self-pay | Admitting: Radiation Oncology

## 2019-03-19 NOTE — Telephone Encounter (Signed)
New message:   LVM for patient to return call to schedule appt from referral received.

## 2019-03-28 NOTE — Progress Notes (Signed)
Thoracic Location of Tumor / Histology:  12/10/18 Diagnosis Lung, needle/core biopsy(ies), Right lower lobe - MODERATELY DIFFERENTIATED ADENOCARCINOMA.  Patient presented with symptoms of: She had a incidental CT scan to evaluate a bladder mass ordered by her urologist Dr. Lovena Neighbours. A suspicious mass was noted within the right lower lobe on the CT.   Biopsies of right lower lobe revealed: Moderately differentiated adenocarcinoma.  Tobacco/Marijuana/Snuff/ETOH use: She is a current smoker of 1 pack per day.   Past/Anticipated interventions by cardiothoracic surgery, if any:  N/A  Past/Anticipated interventions by medical oncology, if any:  03/14/19 Dr Alen Blew Impression and Plan: 1.  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  She is status post radical cystectomy and pelvic exoneration with a final pathology revealing a T4N0 disease.  The natural course of this disease as well as additional therapy was discussed.  At this time I recommended continued active surveillance and use salvage chemotherapy if needed in the future.  Given her diagnosis of lung cancer she has competing comorbidities and she will receive definitive therapy for her lung cancer which could also impact bladder cancer.  2.  Stage IIIA adenocarcinoma she presented with a right lower lobe mass in addition to a upper lobe involvement with a small nodule as well as lymph node involvement in the right hilar and right paratracheal lymph nodes.  Treatment options of this disease was reviewed and her case was discussed in the thoracic tumor board.  Concomitant radiation and chemotherapy remains the standard of care with potentially immunotherapy maintenance upon completing course of therapy.  Complication associated with this therapy including nausea, vomiting, myelosuppression, neutropenia and neutropenic sepsis.  After discussion today, she would like to proceed with this approach at this time.  I will make an  appropriate referral to radiation oncology as well as arrange for chemotherapy education class.  Chemotherapy treatment will start once her radiation schedule is set up.  Signs/Symptoms  Weight changes, if any: She reports losing around 15 lbs since her bladder surgery.   Respiratory complaints, if any: She denies  Hemoptysis, if any: She denies  Pain issues, if any:  She denies.   SAFETY ISSUES:  Prior radiation? No  Pacemaker/ICD?  No  Possible current pregnancy? No  Is the patient on methotrexate? No  Current Complaints / other details:

## 2019-04-01 NOTE — Progress Notes (Signed)
Radiation Oncology         [phone] ________________________________  Initial outpatient Telephone Consultation by telephone as patient was unable to access MyChart video during pandemic precautions   Name: Lauren Salazar MRN: [id number]  Date: 04/02/2019  DOB: 1961/03/24  [id number], [place] @ [place], Mathis Dad, MD   REFERRING PHYSICIAN: Wyatt Portela, MD  DIAGNOSIS:    ICD-10-CM   1. Malignant neoplasm of lower lobe of right lung [facility])  C34.31 Ambulatory referral to Cardiothoracic Surgery  2. Cancer of lower lobe of right lung [facility])  C34.31      Cancer Staging Cancer of lower lobe of right lung [facility]) Staging form: Lung, AJCC 8th Edition - Clinical stage from 04/02/2019: Stage IIIB (cT4, cN2, cM0) - Signed by [name], MD on 04/02/2019  Stage T4 N0 squamous cell carcinoma of the bladder  CHIEF COMPLAINT: Here to discuss management of lung cancer  HISTORY OF PRESENT ILLNESS::Lauren Salazar is a 58 y.o. female who presented with hematuria in 06/2018. After being unsuccessfully treated with antibiotics, she was referred to Dr. Lovena Neighbours. CT abdomen/pelvis was performed on 10/18/2018 and revealed a large bladder mass with possible uterus involvement as well as a suspicious mass within the right lower lobe of the lung. She proceeded to TURBT on 10/31/2018 with pathology revealing: high-grade squamous cell carcinoma with extensive necrosis involving the superficial posterior wall of the bladder with invasion into the detrusor muscle and involving the deep wall of the bladder. For this, she was treated with definitive surgery consisting of robotic-assisted laparoscopic complete cholecystectomy and hysterectomy with BSO on 02/01/2019, with final pathology revealing T4 N0 poorly differentiated squamous cell carcinoma of the bladder.  In light of the suspicious lung mass seen on abdomen/pelvis CT, she proceeded to chest CT on 11/22/2018, which showed: 1.4 cm  right upper lobe spiculated nodule; 4.5 cm right lower lobe lung mass; no enlarged lymph nodes.   Biopsy of the right lower lobe mass performed on 12/10/2018 showed: moderately differentiated adenocarcinoma; findings suggestive of a primary lung adenocarcinoma and do not support metastatic bladder carcinoma.  She also underwent PET scan on 01/16/2019, which revealed: hypermetabolic mass in the right lower lobe and hypermetabolic nodule right upper lobe; hypermetabolic right hilar and right lower paratracheal nodal metastasis.  If considering the 2 nodules in the right lung as T4 disease, the patient has stage T4N2M0 IIIB lung cancer.  She has not undergone MRI staging of her brain.  She is willing to do this.   Tobacco/Marijuana/Snuff/ETOH use: She is a current smoker of 1 pack per day.   Past/Anticipated interventions by cardiothoracic surgery, if any:  N/A     Signs/Symptoms  Weight changes, if any: She reports losing around 15 lbs since her bladder surgery.   Respiratory complaints, if any: She denies  Hemoptysis, if any: She denies  Pain issues, if any:  She denies.   SAFETY ISSUES:  Prior radiation? No  Pacemaker/ICD?  No  Possible current pregnancy? No  Is the patient on methotrexate? No    PREVIOUS RADIATION THERAPY: No  PAST MEDICAL HISTORY:  has a past medical history of Bladder tumor, Complication of anesthesia, and Persistent dry cough.    PAST SURGICAL HISTORY: Past Surgical History:  Procedure Laterality Date   CERVICAL FUSION  2005   c5-c6      CYSTOSCOPY WITH INJECTION N/A 02/01/2019   Procedure: CYSTOSCOPY WITH INJECTION;  Surgeon: Alexis Frock, MD;  Location: [facility];  Service:  Urology;  Laterality: N/A;   LYMPHADENECTOMY Bilateral 02/01/2019   Procedure: LYMPHADENECTOMY;  Surgeon: Alexis Frock, MD;  Location: [facility];  Service: Urology;  Laterality: Bilateral;   ROBOT ASSISTED LAPAROSCOPIC COMPLETE CYSTECT ILEAL CONDUIT N/A 02/01/2019    Procedure: XI ROBOTIC ASSISTED LAPAROSCOPIC COMPLETE CYSTECTOMY, ILEAL CONDUIT, HYSTERECTOMY WITH BILATERAL SALPINGOOPHORECTOMY;  Surgeon: Alexis Frock, MD;  Location: [facility];  Service: Urology;  Laterality: N/A;  6 HRS   TRANSURETHRAL RESECTION OF BLADDER TUMOR N/A 10/31/2018   Procedure: TRANSURETHRAL RESECTION OF BLADDER TUMOR (TURBT) WITH CYSTOSCOPY/ POSSIBLE INTRAVESICAL GEMCITABINE;  Surgeon: Ceasar Mons, MD;  Location: [facility];  Service: Urology;  Laterality: N/A;    FAMILY HISTORY: family history is not on file.  SOCIAL HISTORY:  reports that she has been smoking cigarettes. She has been smoking about 1.00 pack per day. She has never used smokeless tobacco. She reports that she does not drink alcohol or use drugs.  ALLERGIES: Patient has no known allergies.  MEDICATIONS:  Current Outpatient Medications  Medication Sig Dispense Refill   acetaminophen (TYLENOL) 500 MG tablet Take 1,000 mg by mouth every 8 (eight) hours as needed (pain).      ibuprofen (ADVIL) 200 MG tablet Take 200 mg by mouth every 6 (six) hours as needed for moderate pain.     nicotine (NICODERM CQ - DOSED IN MG/24 HOURS) 21 mg/24hr patch Place 21 mg onto the skin daily.     oxyCODONE-acetaminophen (PERCOCET) 5-325 MG tablet Take 1-2 tablets by mouth every 6 (six) hours as needed for moderate pain or severe pain. Post-Operatively (Patient not taking: Reported on 04/02/2019) 20 tablet 0   prochlorperazine (COMPAZINE) 10 MG tablet Take 1 tablet (10 mg total) by mouth every 6 (six) hours as needed for nausea or vomiting. (Patient not taking: Reported on 04/02/2019) 30 tablet 0   No current facility-administered medications for this encounter.     REVIEW OF SYSTEMS:  Notable for that above.   PHYSICAL EXAM:  vitals were not taken for this visit.       LABORATORY DATA:  Lab Results  Component Value Date   WBC 11.7 (H) 03/14/2019   HGB 11.8 (L) 03/14/2019   HCT 37.6 03/14/2019   MCV 93.1  03/14/2019   PLT 638 (H) 03/14/2019   CMP     Component Value Date/Time   NA 141 03/14/2019 1512   K 4.3 03/14/2019 1512   CL 104 03/14/2019 1512   CO2 27 03/14/2019 1512   GLUCOSE 117 (H) 03/14/2019 1512   BUN 15 03/14/2019 1512   CREATININE 0.91 03/14/2019 1512   CALCIUM 8.7 (L) 03/14/2019 1512   PROT 6.6 03/14/2019 1512   ALBUMIN 3.1 (L) 03/14/2019 1512   AST 16 03/14/2019 1512   ALT 19 03/14/2019 1512   ALKPHOS 95 03/14/2019 1512   BILITOT <0.2 (L) 03/14/2019 1512   GFRNONAA >60 03/14/2019 1512   GFRAA >60 03/14/2019 1512        RADIOGRAPHY:   As above     IMPRESSION/PLAN: Stage IIIB adenocarcinoma of the lung   If considering the 2 nodules in the right lung as T4 disease, the patient has stage T4N2M0 IIIB lung cancer.  Otherwise this is stage IIIA lung cancer.  Unfortunately there has been a delay in treating her lung cancer due to her bladder cancer.  She understands the benefits of proceeding with treatment without further delay.  She has not undergone MRI staging of her brain.  She is willing to do this. Manuela Schwartz  Boyles, RT, will navigate this.   I concur with Dr. Alen Blew that chemoradiotherapy is her best option for curative treatment.  However she is very hesitant to undergo 6+ weeks of intensive chemoradiotherapy given how she is feeling postoperatively from her bladder cancer treatment.  She is very enthusiastic about surgery, although I tried to explain to her that surgery would likely require adjuvant therapy and would not necessarily improve her prognosis or be free of significant risks and side effects itself.  After long discussion with her it became clear to me that she would not be comfortable proceeding with chemoradiotherapy unless she got a formal opinion from a cardiothoracic surgeon.  I have made a referral for that.  We discussed the risks benefits and side effects of radiotherapy which will need to take place over 6 to 7 weeks.  She understands that she has a  very serious condition that will require significant treatment if it is to be cured and that there are no guarantees of treatment.  Also, she knows to call me with her decision after she meets with the cardiothoracic surgeon.  I provided my contact information for her and she also knows how to call me through the cancer center switchboard.  She has gone through a tremendous amount of physical and emotional stress this year and I provided emotional support to the best my ability as we reflected on all that she has navigated.     This encounter was provided by telemedicine platform by telephone as patient was unable to access MyChart video during pandemic precautions The patient has given verbal consent for this type of encounter and has been advised to only accept a meeting of this type in a secure network environment. The time spent during this encounter was over 35 minutes. The attendants for this meeting include [name]  and Rosezena Sensor.  During the encounter, [name] was located at [facility] Radiation Oncology Department.  LOUGENIA MORRISSEY was located at home.   __________________________________________   [name], MD  This document serves as a record of services personally performed by [name], MD. It was created on her behalf by Wilburn Mylar, a trained medical scribe. The creation of this record is based on the scribe's personal observations and the provider's statements to them. This document has been checked and approved by the attending provider.

## 2019-04-02 ENCOUNTER — Other Ambulatory Visit: Payer: Self-pay

## 2019-04-02 ENCOUNTER — Ambulatory Visit
Admission: RE | Admit: 2019-04-02 | Discharge: 2019-04-02 | Disposition: A | Discharge status: Home / Self Care | Payer: MEDICAID | Source: Ambulatory Visit | Attending: Radiation Oncology | Admitting: Radiation Oncology

## 2019-04-02 ENCOUNTER — Encounter: Payer: Self-pay | Admitting: Radiation Oncology

## 2019-04-02 DIAGNOSIS — C3431 Malignant neoplasm of lower lobe, right bronchus or lung: Secondary | ICD-10-CM

## 2019-04-03 ENCOUNTER — Telehealth: Payer: Self-pay | Admitting: Radiation Therapy

## 2019-04-03 ENCOUNTER — Other Ambulatory Visit: Payer: Self-pay | Admitting: Radiation Therapy

## 2019-04-03 DIAGNOSIS — C3490 Malignant neoplasm of unspecified part of unspecified bronchus or lung: Secondary | ICD-10-CM

## 2019-04-03 DIAGNOSIS — C679 Malignant neoplasm of bladder, unspecified: Secondary | ICD-10-CM

## 2019-04-03 NOTE — Telephone Encounter (Signed)
Spoke with pt about her upcoming Brain MRI on 12/21. She has the appointment information written down and plans to attend.   Mont Dutton R.T.(R)(T) Radiation Special Procedures Navigator

## 2019-04-03 NOTE — Addendum Note (Signed)
Addended by: Pincus Large on: 04/03/2019 12:15 PM   Modules accepted: Orders

## 2019-04-04 ENCOUNTER — Other Ambulatory Visit: Payer: Self-pay | Admitting: *Deleted

## 2019-04-04 NOTE — Progress Notes (Signed)
The purposed treatment discussed in cancer conference 04/04/19 is for discussion purpose only and is not a binding recommendation.  The patient was not physically examined nor present for their treatment options.  Therefore, final treatment plans cannot be decided.    Dr. Isidore Moos updated on cancer conference discussion.

## 2019-04-05 ENCOUNTER — Encounter: Payer: Medicaid Other | Admitting: Thoracic Surgery (Cardiothoracic Vascular Surgery)

## 2019-04-10 ENCOUNTER — Telehealth: Payer: Self-pay | Admitting: *Deleted

## 2019-04-10 NOTE — Telephone Encounter (Signed)
PATIENT HAS AN APPT. WITH [facility]  DR. Kipp Brood ON 05/03/19 @ 11 AM

## 2019-04-11 ENCOUNTER — Telehealth: Payer: Self-pay | Admitting: *Deleted

## 2019-04-11 NOTE — Telephone Encounter (Signed)
Oncology Nurse Navigator Documentation  Oncology Nurse Navigator Flowsheets 04/11/2019  Abnormal Finding Date -  Confirmed Diagnosis Date -  Diagnosis Status -  Navigator Follow Up Date: -  Navigator Follow Up Reason: -  Navigator Location [facility]  Referral Date to RadOnc/MedOnc -  Navigator Encounter Type Telephone/I received several communication relating to Lauren Salazar and her plan of care.  I called to see if she understood her plan of care and upcoming appts. I was unable to reach anyone but did leave vm message with my name and phone number to call.   Telephone Outgoing Call  Treatment Phase -  Barriers/Navigation Needs Education  Education Other  Interventions Education  Acuity Level 2-Minimal Needs (1-2 Barriers Identified)  Coordination of Care -  Education Method Verbal  Time Spent with Patient 15

## 2019-04-15 ENCOUNTER — Ambulatory Visit (HOSPITAL_COMMUNITY): Admission: RE | Admit: 2019-04-15 | Payer: Self-pay | Source: Ambulatory Visit

## 2019-04-15 ENCOUNTER — Ambulatory Visit (HOSPITAL_COMMUNITY)
Admission: RE | Admit: 2019-04-15 | Discharge: 2019-04-15 | Disposition: A | Discharge status: Home / Self Care | Payer: Medicaid Other | Source: Ambulatory Visit | Attending: Radiation Oncology | Admitting: Radiation Oncology

## 2019-04-15 ENCOUNTER — Other Ambulatory Visit: Payer: Self-pay

## 2019-04-15 DIAGNOSIS — C3490 Malignant neoplasm of unspecified part of unspecified bronchus or lung: Secondary | ICD-10-CM | POA: Insufficient documentation

## 2019-04-15 MED ORDER — GADOBUTROL 1 MMOL/ML IV SOLN
6.0000 mL | Freq: Once | INTRAVENOUS | Status: AC | PRN
  Administered 2019-04-15: 6 mL via INTRAVENOUS

## 2019-04-17 ENCOUNTER — Other Ambulatory Visit: Payer: Self-pay | Admitting: Radiation Oncology

## 2019-04-17 DIAGNOSIS — C3431 Malignant neoplasm of lower lobe, right bronchus or lung: Secondary | ICD-10-CM

## 2019-04-17 DIAGNOSIS — C679 Malignant neoplasm of bladder, unspecified: Secondary | ICD-10-CM

## 2019-04-17 NOTE — Progress Notes (Signed)
ICD-10-CM   1. Malignant neoplasm of urinary bladder, unspecified site [facility])  C67.9 Amb Referral to Palliative Care  2. Malignant neoplasm of lower lobe of right lung (HCC)  C34.31 Amb Referral to Palliative Care   I spoke with the patient by phone this morning to let her know that the MRI of her brain is negative.  Ideally we would like another PET scan to restage her cancer but she is without insurance right now so the consensus between her and me is to proceed with treatment planning as she is concerned about mounting medical costs.  She feels that she is recovering from her bladder cancer treatment and now is able to wrap her mind more around proceeding with ChRT treatment for her lung cancer.  I explained to her that the consensus from the [facility] team is that surgery is not a good option for her. Therefore she states that she would prefer to will decline the cardiothoracic surgery consultation.  She understands that there are no guarantees of treatment and that her prognosis is still delicate but technically curable.  I have asked scheduling to cancel the consultation with Dr. Kipp Brood and please check to see if her CT simulation of the lung can be moved up to the week of January 4, soonest available slot.  I have asked Dr. Alen Blew to reschedule her with medical oncology clinic to get her ready for chemotherapy.  Best case scenario, hoping to start chemotherapy and radiation by the week of January 11.    I have referred her to palliative care for an extra layer of support and to help solidify her goals of care.  -----------------------------------  [name], MD

## 2019-04-29 ENCOUNTER — Ambulatory Visit
Admission: RE | Admit: 2019-04-29 | Discharge: 2019-04-29 | Disposition: A | Discharge status: Home / Self Care | Payer: Medicaid Other | Source: Ambulatory Visit | Attending: Radiation Oncology | Admitting: Radiation Oncology

## 2019-04-29 ENCOUNTER — Ambulatory Visit: Payer: Medicaid Other

## 2019-04-29 ENCOUNTER — Telehealth: Payer: Self-pay | Admitting: *Deleted

## 2019-04-29 ENCOUNTER — Other Ambulatory Visit: Payer: Self-pay

## 2019-04-29 ENCOUNTER — Other Ambulatory Visit: Payer: Self-pay | Admitting: Radiation Oncology

## 2019-04-29 VITALS — BP 143/96 | HR 90 | Temp 98.5°F | Resp 20 | Wt 138.4 lb

## 2019-04-29 DIAGNOSIS — C3431 Malignant neoplasm of lower lobe, right bronchus or lung: Secondary | ICD-10-CM

## 2019-04-29 DIAGNOSIS — Z510 Encounter for antineoplastic radiation therapy: Secondary | ICD-10-CM | POA: Insufficient documentation

## 2019-04-29 LAB — BUN & CREATININE (CHCC)
BUN: 14 mg/dL (ref 6–20)
Creatinine: 0.9 mg/dL (ref 0.44–1.00)
GFR, Est AFR Am: >60 mL/min (ref >60)
GFR, Estimated: >60 mL/min (ref >60)

## 2019-04-29 MED ORDER — SODIUM CHLORIDE 0.9% FLUSH
10.0000 mL | Freq: Once | INTRAVENOUS | Status: AC
  Administered 2019-04-29: 10 mL via INTRAVENOUS

## 2019-04-29 NOTE — Telephone Encounter (Signed)
CALLED PATIENT TO INFORM THAT DR. SQUIRE WANTS HER TO COME IN TODAY, SPOKE WITH PATIENT AND SHE WILL BE HERE @ 2 PM TODAY

## 2019-04-29 NOTE — Progress Notes (Signed)
Has armband been applied?  Yes  Does patient have an allergy to IV contrast dye?: NKDA   Has patient ever received premedication for IV contrast dye?: N/A  Does patient take metformin?: No  If patient does take metformin when was the last dose: N/A  Date of lab work: 04/29/19 BUN: 14 CR: 0.9 EGFR: >60  IV site: Left AC  Has IV site been added to flowsheet?  Yes

## 2019-04-29 NOTE — Telephone Encounter (Signed)
CALLED PATIENT TO INFORM OF STAT LABS FOR 04-29-19 @ 12:15 PM AND SIM TO FOLLOW, LVM FOR A RETURN CALL

## 2019-04-30 ENCOUNTER — Encounter: Payer: Medicaid Other | Admitting: Thoracic Surgery (Cardiothoracic Vascular Surgery)

## 2019-04-30 ENCOUNTER — Encounter: Payer: Self-pay | Admitting: Radiation Oncology

## 2019-04-30 ENCOUNTER — Other Ambulatory Visit: Payer: Self-pay | Admitting: Oncology

## 2019-04-30 ENCOUNTER — Telehealth: Payer: Self-pay | Admitting: Oncology

## 2019-04-30 NOTE — Telephone Encounter (Signed)
Scheduled appt per 1/5 sch message - pt aware of appts added

## 2019-04-30 NOTE — Progress Notes (Signed)
  Radiation Oncology         [phone] ________________________________  Name: Lauren Salazar MRN: [id number]  Date: 04/29/2019  DOB: [birth year]  SIMULATION AND TREATMENT PLANNING NOTE, Special Treatment Procedure Note:  Outpatient  DIAGNOSIS:     ICD-10-CM   1. Cancer of lower lobe of right lung ([place])  C34.31     NARRATIVE:  The patient was brought to the [facility].  Identity was confirmed.  All relevant records and images related to the planned course of therapy were reviewed.  The patient freely provided informed written consent to proceed with treatment after reviewing the details related to the planned course of therapy. The consent form was witnessed and verified by the simulation staff.  I answered her questions in detail today.  Then, the patient was set-up in a stable reproducible  supine position for radiation therapy.  CT images with contrast were obtained.  Surface markings were placed.  The CT images were loaded into the planning software.      RESPIRATORY MOTION MANAGEMENT SIMULATION  NARRATIVE:  In order to account for effect of respiratory motion on target structures and other organs in the planning and delivery of radiotherapy, this patient underwent respiratory motion management simulation.  To accomplish this, when the patient was brought to the CT simulation planning suite, 4D respiratory motion management CT images were obtained.  The CT images were loaded into the planning software.  Then, using a variety of tools including Cine, MIP, and standard views, the target volume and planning target volumes (PTV) were delineated.  Avoidance structures were contoured.  Treatment planning then occurred.  Dose volume histograms were generated and reviewed for each of the requested structure.        Special Treatment Procedure Note: The patient will be receiving chemotherapy concurrently. Chemotherapy heightens the risk of side effects. I have considered  this during the patient's treatment planning process and will monitor the patient accordingly for side effects on a weekly basis. Concurrent chemotherapy increases the complexity of this patient's treatment and therefore this constitutes a special treatment procedure.   A total of 4 medically necessary complex treatment devices were fabricated and supervised by me, in the form of fields with MLCs to block heart, lungs, cord, esophagus. MORE FIELDS WITH MLCs MAY BE ADDED IN DOSIMETRY for dose homogeneity.  I have requested : 3D Simulation with IMRT possibility if 3D conformal RT cannot spare normal tissue;  I have requested a DVH of the following structures:  heart, lungs, cord, esophagus, targets.    The patient will receive 66 Gy in 33 fractions to the chest.   -----------------------------------  [name], MD

## 2019-05-01 ENCOUNTER — Telehealth: Payer: Self-pay | Admitting: *Deleted

## 2019-05-01 DIAGNOSIS — Z510 Encounter for antineoplastic radiation therapy: Secondary | ICD-10-CM | POA: Diagnosis not present

## 2019-05-01 NOTE — Telephone Encounter (Signed)
Oncology Nurse Navigator Documentation  Oncology Nurse Navigator Flowsheets 05/01/2019  Abnormal Finding Date -  Confirmed Diagnosis Date -  Diagnosis Status -  Navigator Follow Up Date: -  Navigator Follow Up Reason: -  Navigator Location [facility]  Referral Date to RadOnc/MedOnc -  Navigator Encounter Type Telephone/I followed up on Lauren Salazar's schedule. She is coming tomorrow for chemo ed.  I called patient's daughter today to see if they would have time after that appt to see me.  I was unable to reach but did leave vm message with my name and phone number to call.   Telephone Outgoing Call  Treatment Phase -  Barriers/Navigation Needs Education  Education Other  Interventions Education  Acuity Level 2-Minimal Needs (1-2 Barriers Identified)  Coordination of Care -  Education Method Verbal  Time Spent with Patient 15

## 2019-05-02 ENCOUNTER — Other Ambulatory Visit: Payer: Self-pay

## 2019-05-02 ENCOUNTER — Encounter: Payer: Self-pay | Admitting: *Deleted

## 2019-05-02 ENCOUNTER — Telehealth: Payer: Self-pay | Admitting: Oncology

## 2019-05-02 ENCOUNTER — Inpatient Hospital Stay: Payer: Medicaid Other | Attending: Oncology

## 2019-05-02 DIAGNOSIS — Z923 Personal history of irradiation: Secondary | ICD-10-CM | POA: Insufficient documentation

## 2019-05-02 DIAGNOSIS — C3481 Malignant neoplasm of overlapping sites of right bronchus and lung: Secondary | ICD-10-CM | POA: Insufficient documentation

## 2019-05-02 DIAGNOSIS — Z90722 Acquired absence of ovaries, bilateral: Secondary | ICD-10-CM | POA: Insufficient documentation

## 2019-05-02 DIAGNOSIS — C679 Malignant neoplasm of bladder, unspecified: Secondary | ICD-10-CM | POA: Insufficient documentation

## 2019-05-02 DIAGNOSIS — Z90710 Acquired absence of both cervix and uterus: Secondary | ICD-10-CM | POA: Insufficient documentation

## 2019-05-02 DIAGNOSIS — Z5111 Encounter for antineoplastic chemotherapy: Secondary | ICD-10-CM | POA: Insufficient documentation

## 2019-05-02 DIAGNOSIS — R112 Nausea with vomiting, unspecified: Secondary | ICD-10-CM | POA: Insufficient documentation

## 2019-05-02 NOTE — Progress Notes (Signed)
Oncology Nurse Navigator Documentation  Oncology Nurse Navigator Flowsheets 05/02/2019  Abnormal Finding Date -  Confirmed Diagnosis Date -  Diagnosis Status -  Navigator Follow Up Date: -  Navigator Follow Up Reason: -  Navigator Location [facility]  Referral Date to RadOnc/MedOnc -  Navigator Encounter Type Other/I received notification that patient needs schedule appts changed.  I reached out to the scheduling team to call patient and re-schedule appts.   Telephone -  Patient Visit Type MedOnc  Treatment Phase -  Barriers/Navigation Needs Coordination of Care  Education -  Interventions Coordination of Care  Acuity Level 2-Minimal Needs (1-2 Barriers Identified)  Coordination of Care Other  Education Method -  Time Spent with Patient 15

## 2019-05-02 NOTE — Telephone Encounter (Signed)
Called pt per 1/7 sch message - spoke with patient and she will come in for rescheduling.

## 2019-05-03 ENCOUNTER — Encounter: Payer: Medicaid Other | Admitting: Thoracic Surgery (Cardiothoracic Vascular Surgery)

## 2019-05-06 ENCOUNTER — Telehealth: Payer: Self-pay | Admitting: Oncology

## 2019-05-06 ENCOUNTER — Ambulatory Visit
Admission: RE | Admit: 2019-05-06 | Discharge: 2019-05-06 | Disposition: A | Discharge status: Home / Self Care | Payer: Medicaid Other | Source: Ambulatory Visit | Attending: Radiation Oncology | Admitting: Radiation Oncology

## 2019-05-06 ENCOUNTER — Ambulatory Visit: Payer: Medicaid Other | Admitting: Radiation Oncology

## 2019-05-06 ENCOUNTER — Other Ambulatory Visit: Payer: Self-pay

## 2019-05-06 DIAGNOSIS — Z510 Encounter for antineoplastic radiation therapy: Secondary | ICD-10-CM | POA: Diagnosis not present

## 2019-05-06 DIAGNOSIS — C3431 Malignant neoplasm of lower lobe, right bronchus or lung: Secondary | ICD-10-CM

## 2019-05-06 MED ORDER — SONAFINE EX EMUL
1.0000 "application " | Freq: Two times a day (BID) | CUTANEOUS | Status: DC
  Administered 2019-05-06: 1 via TOPICAL

## 2019-05-06 NOTE — Telephone Encounter (Signed)
Called pt to reschedule appts to tuesdays - Per MD okay - pt aware of new appts.

## 2019-05-07 ENCOUNTER — Other Ambulatory Visit: Payer: Self-pay

## 2019-05-07 ENCOUNTER — Ambulatory Visit
Admission: RE | Admit: 2019-05-07 | Discharge: 2019-05-07 | Disposition: A | Discharge status: Home / Self Care | Payer: Medicaid Other | Source: Ambulatory Visit | Attending: Radiation Oncology | Admitting: Radiation Oncology

## 2019-05-07 DIAGNOSIS — Z510 Encounter for antineoplastic radiation therapy: Secondary | ICD-10-CM | POA: Diagnosis not present

## 2019-05-08 ENCOUNTER — Ambulatory Visit
Admission: RE | Admit: 2019-05-08 | Discharge: 2019-05-08 | Disposition: A | Discharge status: Home / Self Care | Payer: Medicaid Other | Source: Ambulatory Visit | Attending: Radiation Oncology | Admitting: Radiation Oncology

## 2019-05-08 ENCOUNTER — Other Ambulatory Visit: Payer: Self-pay

## 2019-05-08 DIAGNOSIS — Z510 Encounter for antineoplastic radiation therapy: Secondary | ICD-10-CM | POA: Diagnosis not present

## 2019-05-09 ENCOUNTER — Ambulatory Visit
Admission: RE | Admit: 2019-05-09 | Discharge: 2019-05-09 | Disposition: A | Discharge status: Home / Self Care | Payer: Medicaid Other | Source: Ambulatory Visit | Attending: Radiation Oncology | Admitting: Radiation Oncology

## 2019-05-09 ENCOUNTER — Other Ambulatory Visit: Payer: Self-pay

## 2019-05-09 DIAGNOSIS — Z510 Encounter for antineoplastic radiation therapy: Secondary | ICD-10-CM | POA: Diagnosis not present

## 2019-05-10 ENCOUNTER — Ambulatory Visit: Payer: Medicaid Other | Admitting: Oncology

## 2019-05-10 ENCOUNTER — Other Ambulatory Visit: Payer: Self-pay

## 2019-05-10 ENCOUNTER — Ambulatory Visit: Payer: Medicaid Other

## 2019-05-10 ENCOUNTER — Other Ambulatory Visit: Payer: Medicaid Other

## 2019-05-10 ENCOUNTER — Ambulatory Visit
Admission: RE | Admit: 2019-05-10 | Discharge: 2019-05-10 | Disposition: A | Discharge status: Home / Self Care | Payer: Medicaid Other | Source: Ambulatory Visit | Attending: Radiation Oncology | Admitting: Radiation Oncology

## 2019-05-10 DIAGNOSIS — Z510 Encounter for antineoplastic radiation therapy: Secondary | ICD-10-CM | POA: Diagnosis not present

## 2019-05-13 ENCOUNTER — Other Ambulatory Visit: Payer: Self-pay

## 2019-05-13 ENCOUNTER — Other Ambulatory Visit: Payer: Self-pay | Admitting: Radiation Oncology

## 2019-05-13 ENCOUNTER — Ambulatory Visit
Admission: RE | Admit: 2019-05-13 | Discharge: 2019-05-13 | Disposition: A | Discharge status: Home / Self Care | Payer: Medicaid Other | Source: Ambulatory Visit | Attending: Radiation Oncology | Admitting: Radiation Oncology

## 2019-05-13 DIAGNOSIS — Z510 Encounter for antineoplastic radiation therapy: Secondary | ICD-10-CM | POA: Diagnosis not present

## 2019-05-13 DIAGNOSIS — C3490 Malignant neoplasm of unspecified part of unspecified bronchus or lung: Secondary | ICD-10-CM

## 2019-05-14 ENCOUNTER — Encounter: Payer: Self-pay | Admitting: Oncology

## 2019-05-14 ENCOUNTER — Inpatient Hospital Stay: Payer: Medicaid Other

## 2019-05-14 ENCOUNTER — Inpatient Hospital Stay (HOSPITAL_BASED_OUTPATIENT_CLINIC_OR_DEPARTMENT_OTHER): Payer: Medicaid Other | Admitting: Oncology

## 2019-05-14 ENCOUNTER — Other Ambulatory Visit: Payer: Self-pay

## 2019-05-14 ENCOUNTER — Ambulatory Visit
Admission: RE | Admit: 2019-05-14 | Discharge: 2019-05-14 | Disposition: A | Discharge status: Home / Self Care | Payer: Medicaid Other | Source: Ambulatory Visit | Attending: Radiation Oncology | Admitting: Radiation Oncology

## 2019-05-14 VITALS — BP 125/90 | HR 103 | Temp 98.3°F | Resp 16 | Ht 66.0 in | Wt 139.1 lb

## 2019-05-14 VITALS — BP 143/95 | HR 79 | Temp 98.1°F | Resp 16

## 2019-05-14 DIAGNOSIS — C3481 Malignant neoplasm of overlapping sites of right bronchus and lung: Secondary | ICD-10-CM | POA: Diagnosis not present

## 2019-05-14 DIAGNOSIS — C3490 Malignant neoplasm of unspecified part of unspecified bronchus or lung: Secondary | ICD-10-CM

## 2019-05-14 DIAGNOSIS — Z5111 Encounter for antineoplastic chemotherapy: Secondary | ICD-10-CM | POA: Diagnosis not present

## 2019-05-14 DIAGNOSIS — Z923 Personal history of irradiation: Secondary | ICD-10-CM | POA: Diagnosis not present

## 2019-05-14 DIAGNOSIS — Z510 Encounter for antineoplastic radiation therapy: Secondary | ICD-10-CM | POA: Diagnosis not present

## 2019-05-14 DIAGNOSIS — R112 Nausea with vomiting, unspecified: Secondary | ICD-10-CM | POA: Diagnosis not present

## 2019-05-14 DIAGNOSIS — C679 Malignant neoplasm of bladder, unspecified: Secondary | ICD-10-CM | POA: Diagnosis not present

## 2019-05-14 DIAGNOSIS — Z90710 Acquired absence of both cervix and uterus: Secondary | ICD-10-CM | POA: Diagnosis not present

## 2019-05-14 DIAGNOSIS — Z90722 Acquired absence of ovaries, bilateral: Secondary | ICD-10-CM | POA: Diagnosis not present

## 2019-05-14 LAB — CBC WITH DIFFERENTIAL (CANCER CENTER ONLY)
Abs Immature Granulocytes: 0.05 10*3/uL (ref 0.00–0.07)
Basophils Absolute: 0.1 10*3/uL (ref 0.0–0.1)
Basophils Relative: 1 %
Eosinophils Absolute: 0.1 10*3/uL (ref 0.0–0.5)
Eosinophils Relative: 1 %
HCT: 45.3 % (ref 36.0–46.0)
Hemoglobin: 14.7 g/dL (ref 12.0–15.0)
Immature Granulocytes: 1 %
Lymphocytes Relative: 17 %
Lymphs Abs: 1.5 10*3/uL (ref 0.7–4.0)
MCH: 29.1 pg (ref 26.0–34.0)
MCHC: 32.5 g/dL (ref 30.0–36.0)
MCV: 89.5 fL (ref 80.0–100.0)
Monocytes Absolute: 0.7 10*3/uL (ref 0.1–1.0)
Monocytes Relative: 7 %
Neutro Abs: 6.6 10*3/uL (ref 1.7–7.7)
Neutrophils Relative %: 73 %
Platelet Count: 321 10*3/uL (ref 150–400)
RBC: 5.06 MIL/uL (ref 3.87–5.11)
RDW: 14.7 % (ref 11.5–15.5)
WBC Count: 9 10*3/uL (ref 4.0–10.5)
nRBC: 0 % (ref 0.0–0.2)

## 2019-05-14 LAB — CMP (CANCER CENTER ONLY)
ALT: 12 U/L (ref 0–44)
AST: 15 U/L (ref 15–41)
Albumin: 3.7 g/dL (ref 3.5–5.0)
Alkaline Phosphatase: 86 U/L (ref 38–126)
Anion gap: 10 (ref 5–15)
BUN: 17 mg/dL (ref 6–20)
CO2: 26 mmol/L (ref 22–32)
Calcium: 9.3 mg/dL (ref 8.9–10.3)
Chloride: 104 mmol/L (ref 98–111)
Creatinine: 0.87 mg/dL (ref 0.44–1.00)
GFR, Est AFR Am: >60 mL/min (ref >60)
GFR, Estimated: >60 mL/min (ref >60)
Glucose, Bld: 107 mg/dL — ABNORMAL HIGH (ref 70–99)
Potassium: 4.2 mmol/L (ref 3.5–5.1)
Sodium: 140 mmol/L (ref 135–145)
Total Bilirubin: <0.2 mg/dL — ABNORMAL LOW (ref 0.3–1.2)
Total Protein: 7.4 g/dL (ref 6.5–8.1)

## 2019-05-14 MED ORDER — SODIUM CHLORIDE 0.9 % IV SOLN
Freq: Once | INTRAVENOUS | Status: AC
  Filled 2019-05-14: qty 250

## 2019-05-14 MED ORDER — SODIUM CHLORIDE 0.9 % IV SOLN
20.0000 mg | Freq: Once | INTRAVENOUS | Status: AC
  Administered 2019-05-14: 20 mg via INTRAVENOUS
  Filled 2019-05-14: qty 20

## 2019-05-14 MED ORDER — FAMOTIDINE IN NACL 20-0.9 MG/50ML-% IV SOLN
20.0000 mg | Freq: Once | INTRAVENOUS | Status: AC
  Administered 2019-05-14: 20 mg via INTRAVENOUS

## 2019-05-14 MED ORDER — SODIUM CHLORIDE 0.9 % IV SOLN
45.0000 mg/m2 | Freq: Once | INTRAVENOUS | Status: AC
  Administered 2019-05-14: 78 mg via INTRAVENOUS
  Filled 2019-05-14: qty 13

## 2019-05-14 MED ORDER — PALONOSETRON HCL INJECTION 0.25 MG/5ML
INTRAVENOUS | Status: AC
  Filled 2019-05-14: qty 5

## 2019-05-14 MED ORDER — FAMOTIDINE IN NACL 20-0.9 MG/50ML-% IV SOLN
INTRAVENOUS | Status: AC
  Filled 2019-05-14: qty 50

## 2019-05-14 MED ORDER — PALONOSETRON HCL INJECTION 0.25 MG/5ML
0.2500 mg | Freq: Once | INTRAVENOUS | Status: AC
  Administered 2019-05-14: 0.25 mg via INTRAVENOUS

## 2019-05-14 MED ORDER — SODIUM CHLORIDE 0.9 % IV SOLN
190.4000 mg | Freq: Once | INTRAVENOUS | Status: AC
  Administered 2019-05-14: 190 mg via INTRAVENOUS
  Filled 2019-05-14: qty 19

## 2019-05-14 MED ORDER — DIPHENHYDRAMINE HCL 50 MG/ML IJ SOLN
50.0000 mg | Freq: Once | INTRAMUSCULAR | Status: AC
  Administered 2019-05-14: 50 mg via INTRAVENOUS

## 2019-05-14 MED ORDER — DIPHENHYDRAMINE HCL 50 MG/ML IJ SOLN
INTRAMUSCULAR | Status: AC
  Filled 2019-05-14: qty 1

## 2019-05-14 NOTE — Progress Notes (Signed)
Hematology and Oncology Follow Up Visit  Lauren Salazar [id number] 1960/07/10 59 y.o. 05/14/2019 11:30 AM College, [name] @ [employer], [facility]*   Principle Diagnosis: 59 year old woman with:  1.  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  Status post surgical resection.  2.  Adenocarcinoma of the lung diagnosed in August 2020.  She was found to have stage IIIA with right hilar and right lower paratracheal lymph node metastasis.  Prior Therapy:   She is status post robotic assisted laparoscopic cystectomy and pelvic exoneration including hysterectomy, bilateral salpingo-oophorectomy and ileoconduit diversion completed on February 01, 2019.  Current therapy: Radiation therapy with weekly chemotherapy utilizing carboplatin and paclitaxel.  Week 1 of chemotherapy is scheduled for May 14, 2019.  Interim History: Lauren Salazar is here for return evaluation.  Since the last visit, she reports no major changes in her health.  She has received 1 week of radiation without any complications.  She denies any nausea, vomiting or respiratory distress.  She denies any wheezing or difficulty breathing.  Performance status and quality of life remains reasonable.  She continues to attend activities of daily living and eats well.     Medications: Updated without any changes. Current Outpatient Medications  Medication Sig Dispense Refill  . acetaminophen (TYLENOL) 500 MG tablet Take 1,000 mg by mouth every 8 (eight) hours as needed (pain).     . nicotine (NICODERM CQ - DOSED IN MG/24 HOURS) 21 mg/24hr patch Place 21 mg onto the skin daily.    . prochlorperazine (COMPAZINE) 10 MG tablet Take 1 tablet (10 mg total) by mouth every 6 (six) hours as needed for nausea or vomiting. (Patient not taking: Reported on 04/02/2019) 30 tablet 0   No current facility-administered medications for this visit.     Allergies: No Known  Allergies      Physical Exam: Blood pressure 125/90, pulse (!) 103, temperature 98.3 F (36.8 C), temperature source Temporal, resp. rate 16, height 5\' 6"  (1.676 m), weight 139 lb 1.6 oz (63.1 kg), SpO2 99 %.   ECOG: 1     General appearance: Alert, awake without any distress. Head: Atraumatic without abnormalities Oropharynx: Without any thrush or ulcers. Eyes: No scleral icterus. Lymph nodes: No lymphadenopathy noted in the cervical, supraclavicular, or axillary nodes Heart:regular rate and rhythm, without any murmurs or gallops.   Lung: Clear to auscultation without any rhonchi, wheezes or dullness to percussion. Abdomin: Soft, nontender without any shifting dullness or ascites. Musculoskeletal: No clubbing or cyanosis. Neurological: No motor or sensory deficits. Skin: No rashes or lesions. Psychiatric: Mood and affect appeared normal.     Lab Results: Lab Results  Component Value Date   WBC 11.7 (H) 03/14/2019   HGB 11.8 (L) 03/14/2019   HCT 37.6 03/14/2019   MCV 93.1 03/14/2019   PLT 638 (H) 03/14/2019     Chemistry      Component Value Date/Time   NA 141 03/14/2019 1512   K 4.3 03/14/2019 1512   CL 104 03/14/2019 1512   CO2 27 03/14/2019 1512   BUN 14 04/29/2019 1427   CREATININE 0.90 04/29/2019 1427      Component Value Date/Time   CALCIUM 8.7 (L) 03/14/2019 1512   ALKPHOS 95 03/14/2019 1512   AST 16 03/14/2019 1512   ALT 19 03/14/2019 1512   BILITOT <0.2 (L) 03/14/2019 1512         Impression and Plan:   59 year old woman with:    1.  Right lung cancer diagnosed in August 2020.  She was found to stage IIIA adenocarcinoma with a right lower lobe mass in addition to a upper lobe involvement with a small nodule as well as lymph node involvement in the right hilar and right paratracheal lymph nodes.  She is currently receiving definitive therapy with radiation and scheduled to start carboplatin and paclitaxel chemotherapy.  Risks and  benefits of this therapy was discussed again today.  Potential complications include nausea, vomiting, myelosuppression, neuropathy as well as infusion related complications were reviewed.  She is agreeable to continuing the plan is to complete 6 to 7 weeks of therapy.  1.  Bladder cancer diagnosed in July 2020.  She was found to have T4N0 squamous cell carcinoma.  She is currently on active surveillance at this time with the routine imaging studies to cover both malignancy will be scheduled in the future.  Repeat imaging studies will be tentatively scheduled for March 2021, completing her lung cancer treatment.   3.  Antiemetics: Compazine will be available to her events nausea and vomiting.  4.  IV access: Risks and benefits of Port-A-Cath insertion was discussed.  Complications including bleeding, thrombosis and infection were reiterated.  The time being she would like to defer that option but open to the idea if access becomes an issue.  5.  Follow-up: She will continue to follow weekly for chemotherapy and have MD follow-up in 2 to 3 weeks.   30  minutes was spent on this encounter today.  The time was dedicated to reviewing her disease status, reviewing laboratory data, imaging studies as well as addressing complications related to her cancer and cancer therapy.  Zola Button, MD 1/19/202111:30 AM

## 2019-05-14 NOTE — Progress Notes (Signed)
Met w/ pt to introduce myself as her Arboriculturist.  Pt has applied for Medicaid and was denied.  She appealed that decision and there was a hearing in her behalf.  She is waiting for the outcome of the appeal.  I offered the [place], went over what it covers and gave her an expense sheet.  She would like to apply and because she's not working right now she will provide a letter of support.  Once received I will approve her for the grant.  She has my card for any questions or concerns she may have in the future.

## 2019-05-14 NOTE — Patient Instructions (Signed)
[facility] Discharge Instructions for Patients Receiving Chemotherapy  Today you received the following chemotherapy agents :  Paclitaxel,  Carboplatin.  To help prevent nausea and vomiting after your treatment, we encourage you to take your nausea medication as prescribed.  TAKE  COMPAZINE 10 MG BY MOUTH EVERY 6 HOURS AS NEEDED FOR NAUSEA.  DO NOT EAT GREASY NOR SPICY FOODS.  DO DRINK LOTS OF FLUIDS AS TOLERATED.   If you develop nausea and vomiting that is not controlled by your nausea medication, call the clinic.   BELOW ARE SYMPTOMS THAT SHOULD BE REPORTED IMMEDIATELY:  *FEVER GREATER THAN 100.5 F  *CHILLS WITH OR WITHOUT FEVER  NAUSEA AND VOMITING THAT IS NOT CONTROLLED WITH YOUR NAUSEA MEDICATION  *UNUSUAL SHORTNESS OF BREATH  *UNUSUAL BRUISING OR BLEEDING  TENDERNESS IN MOUTH AND THROAT WITH OR WITHOUT PRESENCE OF ULCERS  *URINARY PROBLEMS  *BOWEL PROBLEMS  UNUSUAL RASH Items with * indicate a potential emergency and should be followed up as soon as possible.  Feel free to call the clinic should you have any questions or concerns. The clinic phone number is [phone].  Please show the [facility] [place] at check-in to the Emergency Department and triage nurse.

## 2019-05-15 ENCOUNTER — Ambulatory Visit: Payer: Medicaid Other

## 2019-05-15 ENCOUNTER — Telehealth: Payer: Self-pay | Admitting: Oncology

## 2019-05-15 ENCOUNTER — Telehealth: Payer: Self-pay | Admitting: Emergency Medicine

## 2019-05-15 NOTE — Telephone Encounter (Signed)
Scheduled appt per 1/19 los.  Was not able to reach patient, but patient will get an updated appt calendar at their next scheduled appt.

## 2019-05-15 NOTE — Telephone Encounter (Signed)
-----   Message from Jarvis Morgan, RN sent at 05/14/2019  6:13 PM EST ----- Regarding: Chemo follow up First time  Taxol, [place],  Dr.  Alen Blew,  TB, RN

## 2019-05-15 NOTE — Telephone Encounter (Signed)
Chemo f/u call, no answer.  LVM requesting pt call back MD Shadad's office with any questions or concerns regarding recent infusion appt.

## 2019-05-16 ENCOUNTER — Ambulatory Visit
Admission: RE | Admit: 2019-05-16 | Discharge: 2019-05-16 | Disposition: A | Discharge status: Home / Self Care | Payer: Medicaid Other | Source: Ambulatory Visit | Attending: Radiation Oncology | Admitting: Radiation Oncology

## 2019-05-16 ENCOUNTER — Other Ambulatory Visit: Payer: Self-pay

## 2019-05-16 DIAGNOSIS — Z510 Encounter for antineoplastic radiation therapy: Secondary | ICD-10-CM | POA: Diagnosis not present

## 2019-05-17 ENCOUNTER — Ambulatory Visit: Payer: Medicaid Other

## 2019-05-17 ENCOUNTER — Ambulatory Visit
Admission: RE | Admit: 2019-05-17 | Discharge: 2019-05-17 | Disposition: A | Discharge status: Home / Self Care | Payer: Medicaid Other | Source: Ambulatory Visit | Attending: Radiation Oncology | Admitting: Radiation Oncology

## 2019-05-17 ENCOUNTER — Other Ambulatory Visit: Payer: Medicaid Other

## 2019-05-17 ENCOUNTER — Encounter: Payer: Self-pay | Admitting: Oncology

## 2019-05-17 ENCOUNTER — Other Ambulatory Visit: Payer: Self-pay

## 2019-05-17 DIAGNOSIS — Z510 Encounter for antineoplastic radiation therapy: Secondary | ICD-10-CM | POA: Diagnosis not present

## 2019-05-17 NOTE — Progress Notes (Signed)
Met with patient at registration whom brought letter of support for grant.  Approved patient for one-time $700 [place]. Patient has a copy of the approval letter as well as the expense sheet along with the Outpatient pharmacy information. Went over expenses and how they are covered. Patient received a gift card today.  She has Lenise's card for any additional financial questions or concerns.

## 2019-05-20 ENCOUNTER — Ambulatory Visit
Admission: RE | Admit: 2019-05-20 | Discharge: 2019-05-20 | Disposition: A | Discharge status: Home / Self Care | Payer: Medicaid Other | Source: Ambulatory Visit | Attending: Radiation Oncology | Admitting: Radiation Oncology

## 2019-05-20 DIAGNOSIS — Z510 Encounter for antineoplastic radiation therapy: Secondary | ICD-10-CM | POA: Diagnosis not present

## 2019-05-21 ENCOUNTER — Inpatient Hospital Stay: Payer: Medicaid Other

## 2019-05-21 ENCOUNTER — Ambulatory Visit
Admission: RE | Admit: 2019-05-21 | Discharge: 2019-05-21 | Disposition: A | Discharge status: Home / Self Care | Payer: Medicaid Other | Source: Ambulatory Visit | Attending: Radiation Oncology | Admitting: Radiation Oncology

## 2019-05-21 ENCOUNTER — Other Ambulatory Visit: Payer: Self-pay

## 2019-05-21 VITALS — BP 128/89 | HR 97 | Temp 98.7°F | Resp 16 | Wt 138.0 lb

## 2019-05-21 DIAGNOSIS — C3490 Malignant neoplasm of unspecified part of unspecified bronchus or lung: Secondary | ICD-10-CM

## 2019-05-21 DIAGNOSIS — Z510 Encounter for antineoplastic radiation therapy: Secondary | ICD-10-CM | POA: Diagnosis not present

## 2019-05-21 DIAGNOSIS — Z5111 Encounter for antineoplastic chemotherapy: Secondary | ICD-10-CM | POA: Diagnosis not present

## 2019-05-21 LAB — CMP (CANCER CENTER ONLY)
ALT: 24 U/L (ref 0–44)
AST: 15 U/L (ref 15–41)
Albumin: 3.5 g/dL (ref 3.5–5.0)
Alkaline Phosphatase: 73 U/L (ref 38–126)
Anion gap: 10 (ref 5–15)
BUN: 18 mg/dL (ref 6–20)
CO2: 26 mmol/L (ref 22–32)
Calcium: 8.9 mg/dL (ref 8.9–10.3)
Chloride: 103 mmol/L (ref 98–111)
Creatinine: 0.85 mg/dL (ref 0.44–1.00)
GFR, Est AFR Am: >60 mL/min (ref >60)
GFR, Estimated: >60 mL/min (ref >60)
Glucose, Bld: 120 mg/dL — ABNORMAL HIGH (ref 70–99)
Potassium: 3.8 mmol/L (ref 3.5–5.1)
Sodium: 139 mmol/L (ref 135–145)
Total Bilirubin: 0.2 mg/dL — ABNORMAL LOW (ref 0.3–1.2)
Total Protein: 6.8 g/dL (ref 6.5–8.1)

## 2019-05-21 LAB — CBC WITH DIFFERENTIAL (CANCER CENTER ONLY)
Abs Immature Granulocytes: 0.04 10*3/uL (ref 0.00–0.07)
Basophils Absolute: 0 10*3/uL (ref 0.0–0.1)
Basophils Relative: 0 %
Eosinophils Absolute: 0.1 10*3/uL (ref 0.0–0.5)
Eosinophils Relative: 2 %
HCT: 41 % (ref 36.0–46.0)
Hemoglobin: 13.4 g/dL (ref 12.0–15.0)
Immature Granulocytes: 1 %
Lymphocytes Relative: 11 %
Lymphs Abs: 0.8 10*3/uL (ref 0.7–4.0)
MCH: 29.1 pg (ref 26.0–34.0)
MCHC: 32.7 g/dL (ref 30.0–36.0)
MCV: 88.9 fL (ref 80.0–100.0)
Monocytes Absolute: 0.5 10*3/uL (ref 0.1–1.0)
Monocytes Relative: 6 %
Neutro Abs: 6.3 10*3/uL (ref 1.7–7.7)
Neutrophils Relative %: 80 %
Platelet Count: 282 10*3/uL (ref 150–400)
RBC: 4.61 MIL/uL (ref 3.87–5.11)
RDW: 14.5 % (ref 11.5–15.5)
WBC Count: 7.8 10*3/uL (ref 4.0–10.5)
nRBC: 0 % (ref 0.0–0.2)

## 2019-05-21 MED ORDER — SODIUM CHLORIDE 0.9 % IV SOLN
Freq: Once | INTRAVENOUS | Status: AC
  Filled 2019-05-21: qty 250

## 2019-05-21 MED ORDER — SODIUM CHLORIDE 0.9 % IV SOLN
193.8000 mg | Freq: Once | INTRAVENOUS | Status: AC
  Administered 2019-05-21: 190 mg via INTRAVENOUS
  Filled 2019-05-21: qty 19

## 2019-05-21 MED ORDER — PALONOSETRON HCL INJECTION 0.25 MG/5ML
INTRAVENOUS | Status: AC
  Filled 2019-05-21: qty 5

## 2019-05-21 MED ORDER — FAMOTIDINE IN NACL 20-0.9 MG/50ML-% IV SOLN
20.0000 mg | Freq: Once | INTRAVENOUS | Status: AC
  Administered 2019-05-21: 20 mg via INTRAVENOUS

## 2019-05-21 MED ORDER — SODIUM CHLORIDE 0.9 % IV SOLN
45.0000 mg/m2 | Freq: Once | INTRAVENOUS | Status: AC
  Administered 2019-05-21: 78 mg via INTRAVENOUS
  Filled 2019-05-21: qty 13

## 2019-05-21 MED ORDER — SODIUM CHLORIDE 0.9 % IV SOLN
50.0000 mg | Freq: Once | INTRAVENOUS | Status: AC
  Administered 2019-05-21: 50 mg via INTRAVENOUS
  Filled 2019-05-21: qty 1

## 2019-05-21 MED ORDER — FAMOTIDINE IN NACL 20-0.9 MG/50ML-% IV SOLN
INTRAVENOUS | Status: AC
  Filled 2019-05-21: qty 50

## 2019-05-21 MED ORDER — SODIUM CHLORIDE 0.9 % IV SOLN
20.0000 mg | Freq: Once | INTRAVENOUS | Status: AC
  Administered 2019-05-21: 20 mg via INTRAVENOUS
  Filled 2019-05-21: qty 20

## 2019-05-21 MED ORDER — PALONOSETRON HCL INJECTION 0.25 MG/5ML
0.2500 mg | Freq: Once | INTRAVENOUS | Status: AC
  Administered 2019-05-21: 0.25 mg via INTRAVENOUS

## 2019-05-21 MED ORDER — DIPHENHYDRAMINE HCL 50 MG/ML IJ SOLN: 50.0000 mg | Freq: Once | INTRAMUSCULAR | Status: DC

## 2019-05-21 NOTE — Patient Instructions (Signed)
[facility] Discharge Instructions for Patients Receiving Chemotherapy  Today you received the following chemotherapy agents :  Paclitaxel,  Carboplatin.  To help prevent nausea and vomiting after your treatment, we encourage you to take your nausea medication as prescribed.  TAKE  COMPAZINE 10 MG BY MOUTH EVERY 6 HOURS AS NEEDED FOR NAUSEA.  DO NOT EAT GREASY NOR SPICY FOODS.  DO DRINK LOTS OF FLUIDS AS TOLERATED.   If you develop nausea and vomiting that is not controlled by your nausea medication, call the clinic.   BELOW ARE SYMPTOMS THAT SHOULD BE REPORTED IMMEDIATELY:  *FEVER GREATER THAN 100.5 F  *CHILLS WITH OR WITHOUT FEVER  NAUSEA AND VOMITING THAT IS NOT CONTROLLED WITH YOUR NAUSEA MEDICATION  *UNUSUAL SHORTNESS OF BREATH  *UNUSUAL BRUISING OR BLEEDING  TENDERNESS IN MOUTH AND THROAT WITH OR WITHOUT PRESENCE OF ULCERS  *URINARY PROBLEMS  *BOWEL PROBLEMS  UNUSUAL RASH Items with * indicate a potential emergency and should be followed up as soon as possible.  Feel free to call the clinic should you have any questions or concerns. The clinic phone number is [phone].  Please show the [place] at check-in to the Emergency Department and triage nurse.

## 2019-05-22 ENCOUNTER — Ambulatory Visit
Admission: RE | Admit: 2019-05-22 | Discharge: 2019-05-22 | Disposition: A | Discharge status: Home / Self Care | Payer: Medicaid Other | Source: Ambulatory Visit | Attending: Radiation Oncology | Admitting: Radiation Oncology

## 2019-05-22 ENCOUNTER — Other Ambulatory Visit: Payer: Self-pay

## 2019-05-22 DIAGNOSIS — Z510 Encounter for antineoplastic radiation therapy: Secondary | ICD-10-CM | POA: Diagnosis not present

## 2019-05-23 ENCOUNTER — Ambulatory Visit
Admission: RE | Admit: 2019-05-23 | Discharge: 2019-05-23 | Disposition: A | Discharge status: Home / Self Care | Payer: Medicaid Other | Source: Ambulatory Visit | Attending: Radiation Oncology | Admitting: Radiation Oncology

## 2019-05-23 DIAGNOSIS — Z510 Encounter for antineoplastic radiation therapy: Secondary | ICD-10-CM | POA: Diagnosis not present

## 2019-05-24 ENCOUNTER — Ambulatory Visit
Admission: RE | Admit: 2019-05-24 | Discharge: 2019-05-24 | Disposition: A | Discharge status: Home / Self Care | Payer: Medicaid Other | Source: Ambulatory Visit | Attending: Radiation Oncology | Admitting: Radiation Oncology

## 2019-05-24 ENCOUNTER — Other Ambulatory Visit: Payer: Self-pay

## 2019-05-24 ENCOUNTER — Other Ambulatory Visit: Payer: Medicaid Other

## 2019-05-24 ENCOUNTER — Ambulatory Visit: Payer: Medicaid Other

## 2019-05-24 DIAGNOSIS — Z510 Encounter for antineoplastic radiation therapy: Secondary | ICD-10-CM | POA: Diagnosis not present

## 2019-05-27 ENCOUNTER — Ambulatory Visit
Admission: RE | Admit: 2019-05-27 | Discharge: 2019-05-27 | Disposition: A | Discharge status: Home / Self Care | Payer: Medicaid Other | Source: Ambulatory Visit | Attending: Radiation Oncology | Admitting: Radiation Oncology

## 2019-05-27 ENCOUNTER — Other Ambulatory Visit: Payer: Self-pay

## 2019-05-27 DIAGNOSIS — C3431 Malignant neoplasm of lower lobe, right bronchus or lung: Secondary | ICD-10-CM | POA: Insufficient documentation

## 2019-05-27 DIAGNOSIS — Z510 Encounter for antineoplastic radiation therapy: Secondary | ICD-10-CM | POA: Insufficient documentation

## 2019-05-28 ENCOUNTER — Other Ambulatory Visit: Payer: Self-pay

## 2019-05-28 ENCOUNTER — Inpatient Hospital Stay: Payer: Medicaid Other

## 2019-05-28 ENCOUNTER — Ambulatory Visit
Admission: RE | Admit: 2019-05-28 | Discharge: 2019-05-28 | Disposition: A | Discharge status: Home / Self Care | Payer: Medicaid Other | Source: Ambulatory Visit | Attending: Radiation Oncology | Admitting: Radiation Oncology

## 2019-05-28 ENCOUNTER — Inpatient Hospital Stay: Payer: Medicaid Other | Attending: Oncology

## 2019-05-28 VITALS — BP 128/90 | HR 98 | Temp 98.9°F | Resp 18 | Wt 138.2 lb

## 2019-05-28 DIAGNOSIS — Z5111 Encounter for antineoplastic chemotherapy: Secondary | ICD-10-CM | POA: Insufficient documentation

## 2019-05-28 DIAGNOSIS — C3401 Malignant neoplasm of right main bronchus: Secondary | ICD-10-CM | POA: Diagnosis not present

## 2019-05-28 DIAGNOSIS — Z90722 Acquired absence of ovaries, bilateral: Secondary | ICD-10-CM | POA: Diagnosis not present

## 2019-05-28 DIAGNOSIS — C679 Malignant neoplasm of bladder, unspecified: Secondary | ICD-10-CM | POA: Insufficient documentation

## 2019-05-28 DIAGNOSIS — K432 Incisional hernia without obstruction or gangrene: Secondary | ICD-10-CM | POA: Diagnosis not present

## 2019-05-28 DIAGNOSIS — Z923 Personal history of irradiation: Secondary | ICD-10-CM | POA: Diagnosis not present

## 2019-05-28 DIAGNOSIS — C3490 Malignant neoplasm of unspecified part of unspecified bronchus or lung: Secondary | ICD-10-CM

## 2019-05-28 DIAGNOSIS — Z9221 Personal history of antineoplastic chemotherapy: Secondary | ICD-10-CM | POA: Diagnosis not present

## 2019-05-28 DIAGNOSIS — Z510 Encounter for antineoplastic radiation therapy: Secondary | ICD-10-CM | POA: Diagnosis not present

## 2019-05-28 DIAGNOSIS — Z90710 Acquired absence of both cervix and uterus: Secondary | ICD-10-CM | POA: Insufficient documentation

## 2019-05-28 DIAGNOSIS — R110 Nausea: Secondary | ICD-10-CM | POA: Insufficient documentation

## 2019-05-28 DIAGNOSIS — R5383 Other fatigue: Secondary | ICD-10-CM | POA: Diagnosis not present

## 2019-05-28 LAB — CBC WITH DIFFERENTIAL (CANCER CENTER ONLY)
Abs Immature Granulocytes: 0.02 10*3/uL (ref 0.00–0.07)
Basophils Absolute: 0 10*3/uL (ref 0.0–0.1)
Basophils Relative: 0 %
Eosinophils Absolute: 0 10*3/uL (ref 0.0–0.5)
Eosinophils Relative: 1 %
HCT: 42.9 % (ref 36.0–46.0)
Hemoglobin: 14 g/dL (ref 12.0–15.0)
Immature Granulocytes: 0 %
Lymphocytes Relative: 14 %
Lymphs Abs: 0.7 10*3/uL (ref 0.7–4.0)
MCH: 29.7 pg (ref 26.0–34.0)
MCHC: 32.6 g/dL (ref 30.0–36.0)
MCV: 91.1 fL (ref 80.0–100.0)
Monocytes Absolute: 0.4 10*3/uL (ref 0.1–1.0)
Monocytes Relative: 9 %
Neutro Abs: 3.6 10*3/uL (ref 1.7–7.7)
Neutrophils Relative %: 76 %
Platelet Count: 257 10*3/uL (ref 150–400)
RBC: 4.71 MIL/uL (ref 3.87–5.11)
RDW: 14.6 % (ref 11.5–15.5)
WBC Count: 4.8 10*3/uL (ref 4.0–10.5)
nRBC: 0 % (ref 0.0–0.2)

## 2019-05-28 LAB — CMP (CANCER CENTER ONLY)
ALT: 18 U/L (ref 0–44)
AST: 16 U/L (ref 15–41)
Albumin: 3.6 g/dL (ref 3.5–5.0)
Alkaline Phosphatase: 80 U/L (ref 38–126)
Anion gap: 10 (ref 5–15)
BUN: 13 mg/dL (ref 6–20)
CO2: 28 mmol/L (ref 22–32)
Calcium: 9.5 mg/dL (ref 8.9–10.3)
Chloride: 103 mmol/L (ref 98–111)
Creatinine: 0.79 mg/dL (ref 0.44–1.00)
GFR, Est AFR Am: >60 mL/min (ref >60)
GFR, Estimated: >60 mL/min (ref >60)
Glucose, Bld: 114 mg/dL — ABNORMAL HIGH (ref 70–99)
Potassium: 3.9 mmol/L (ref 3.5–5.1)
Sodium: 141 mmol/L (ref 135–145)
Total Bilirubin: 0.3 mg/dL (ref 0.3–1.2)
Total Protein: 7.3 g/dL (ref 6.5–8.1)

## 2019-05-28 MED ORDER — SODIUM CHLORIDE 0.9 % IV SOLN
Freq: Once | INTRAVENOUS | Status: AC
  Filled 2019-05-28: qty 250

## 2019-05-28 MED ORDER — HEPARIN SOD (PORK) LOCK FLUSH 100 UNIT/ML IV SOLN
500.0000 [IU] | Freq: Once | INTRAVENOUS | Status: DC | PRN
  Filled 2019-05-28: qty 5

## 2019-05-28 MED ORDER — FAMOTIDINE IN NACL 20-0.9 MG/50ML-% IV SOLN
20.0000 mg | Freq: Once | INTRAVENOUS | Status: AC
  Administered 2019-05-28: 14:00:00 20 mg via INTRAVENOUS

## 2019-05-28 MED ORDER — DIPHENHYDRAMINE HCL 50 MG/ML IJ SOLN
INTRAMUSCULAR | Status: AC
  Filled 2019-05-28: qty 1

## 2019-05-28 MED ORDER — FAMOTIDINE IN NACL 20-0.9 MG/50ML-% IV SOLN
INTRAVENOUS | Status: AC
  Filled 2019-05-28: qty 50

## 2019-05-28 MED ORDER — SODIUM CHLORIDE 0.9 % IV SOLN
202.8000 mg | Freq: Once | INTRAVENOUS | Status: AC
  Administered 2019-05-28: 16:00:00 200 mg via INTRAVENOUS
  Filled 2019-05-28: qty 20

## 2019-05-28 MED ORDER — SODIUM CHLORIDE 0.9 % IV SOLN
20.0000 mg | Freq: Once | INTRAVENOUS | Status: AC
  Administered 2019-05-28: 14:00:00 20 mg via INTRAVENOUS
  Filled 2019-05-28: qty 20

## 2019-05-28 MED ORDER — SODIUM CHLORIDE 0.9% FLUSH
10.0000 mL | INTRAVENOUS | Status: DC | PRN
  Filled 2019-05-28: qty 10

## 2019-05-28 MED ORDER — SODIUM CHLORIDE 0.9 % IV SOLN
45.0000 mg/m2 | Freq: Once | INTRAVENOUS | Status: AC
  Administered 2019-05-28: 15:00:00 78 mg via INTRAVENOUS
  Filled 2019-05-28: qty 13

## 2019-05-28 MED ORDER — SODIUM CHLORIDE 0.9 % IV SOLN
50.0000 mg | Freq: Once | INTRAVENOUS | Status: AC
  Administered 2019-05-28: 50 mg via INTRAVENOUS
  Filled 2019-05-28: qty 1

## 2019-05-28 MED ORDER — PALONOSETRON HCL INJECTION 0.25 MG/5ML
INTRAVENOUS | Status: AC
  Filled 2019-05-28: qty 5

## 2019-05-28 MED ORDER — PALONOSETRON HCL INJECTION 0.25 MG/5ML
0.2500 mg | Freq: Once | INTRAVENOUS | Status: AC
  Administered 2019-05-28: 13:00:00 0.25 mg via INTRAVENOUS

## 2019-05-28 NOTE — Patient Instructions (Signed)
[facility] Discharge Instructions for Patients Receiving Chemotherapy  Today you received the following chemotherapy agents :  Paclitaxel and Carboplatin.  To help prevent nausea and vomiting after your treatment, we encourage you to take your nausea medication as prescribed.  TAKE  COMPAZINE 10 MG BY MOUTH EVERY 6 HOURS AS NEEDED FOR NAUSEA.  DO NOT EAT GREASY NOR SPICY FOODS.  DO DRINK LOTS OF FLUIDS AS TOLERATED.   If you develop nausea and vomiting that is not controlled by your nausea medication, call the clinic.   BELOW ARE SYMPTOMS THAT SHOULD BE REPORTED IMMEDIATELY:  *FEVER GREATER THAN 100.5 F  *CHILLS WITH OR WITHOUT FEVER  NAUSEA AND VOMITING THAT IS NOT CONTROLLED WITH YOUR NAUSEA MEDICATION  *UNUSUAL SHORTNESS OF BREATH  *UNUSUAL BRUISING OR BLEEDING  TENDERNESS IN MOUTH AND THROAT WITH OR WITHOUT PRESENCE OF ULCERS  *URINARY PROBLEMS  *BOWEL PROBLEMS  UNUSUAL RASH Items with * indicate a potential emergency and should be followed up as soon as possible.  Feel free to call the clinic should you have any questions or concerns. The clinic phone number is [phone].  Please show the [facility] at check-in to the Emergency Department and triage nurse.

## 2019-05-29 ENCOUNTER — Ambulatory Visit
Admission: RE | Admit: 2019-05-29 | Discharge: 2019-05-29 | Disposition: A | Discharge status: Home / Self Care | Payer: Medicaid Other | Source: Ambulatory Visit | Attending: Radiation Oncology | Admitting: Radiation Oncology

## 2019-05-29 ENCOUNTER — Other Ambulatory Visit: Payer: Self-pay

## 2019-05-29 DIAGNOSIS — Z510 Encounter for antineoplastic radiation therapy: Secondary | ICD-10-CM | POA: Diagnosis not present

## 2019-05-30 ENCOUNTER — Ambulatory Visit
Admission: RE | Admit: 2019-05-30 | Discharge: 2019-05-30 | Disposition: A | Discharge status: Home / Self Care | Payer: Medicaid Other | Source: Ambulatory Visit | Attending: Radiation Oncology | Admitting: Radiation Oncology

## 2019-05-30 ENCOUNTER — Other Ambulatory Visit: Payer: Self-pay

## 2019-05-30 DIAGNOSIS — Z510 Encounter for antineoplastic radiation therapy: Secondary | ICD-10-CM | POA: Diagnosis not present

## 2019-05-31 ENCOUNTER — Other Ambulatory Visit: Payer: Self-pay

## 2019-05-31 ENCOUNTER — Ambulatory Visit
Admission: RE | Admit: 2019-05-31 | Discharge: 2019-05-31 | Disposition: A | Discharge status: Home / Self Care | Payer: Medicaid Other | Source: Ambulatory Visit | Attending: Radiation Oncology | Admitting: Radiation Oncology

## 2019-05-31 ENCOUNTER — Other Ambulatory Visit: Payer: Medicaid Other

## 2019-05-31 ENCOUNTER — Ambulatory Visit: Payer: Medicaid Other

## 2019-05-31 DIAGNOSIS — Z510 Encounter for antineoplastic radiation therapy: Secondary | ICD-10-CM | POA: Diagnosis not present

## 2019-06-03 ENCOUNTER — Other Ambulatory Visit: Payer: Self-pay

## 2019-06-03 ENCOUNTER — Other Ambulatory Visit: Payer: Self-pay | Admitting: Radiation Oncology

## 2019-06-03 ENCOUNTER — Ambulatory Visit
Admission: RE | Admit: 2019-06-03 | Discharge: 2019-06-03 | Disposition: A | Discharge status: Home / Self Care | Payer: Medicaid Other | Source: Ambulatory Visit | Attending: Radiation Oncology | Admitting: Radiation Oncology

## 2019-06-03 DIAGNOSIS — Z510 Encounter for antineoplastic radiation therapy: Secondary | ICD-10-CM | POA: Diagnosis not present

## 2019-06-04 ENCOUNTER — Other Ambulatory Visit: Payer: Self-pay

## 2019-06-04 ENCOUNTER — Ambulatory Visit
Admission: RE | Admit: 2019-06-04 | Discharge: 2019-06-04 | Disposition: A | Discharge status: Home / Self Care | Payer: Medicaid Other | Source: Ambulatory Visit | Attending: Radiation Oncology | Admitting: Radiation Oncology

## 2019-06-04 ENCOUNTER — Other Ambulatory Visit: Payer: Medicaid Other

## 2019-06-04 ENCOUNTER — Inpatient Hospital Stay: Payer: Medicaid Other

## 2019-06-04 ENCOUNTER — Inpatient Hospital Stay (HOSPITAL_BASED_OUTPATIENT_CLINIC_OR_DEPARTMENT_OTHER): Payer: Medicaid Other | Admitting: Oncology

## 2019-06-04 VITALS — BP 119/88 | HR 107 | Temp 98.5°F | Resp 20 | Ht 66.0 in | Wt 137.4 lb

## 2019-06-04 VITALS — HR 99

## 2019-06-04 DIAGNOSIS — C3490 Malignant neoplasm of unspecified part of unspecified bronchus or lung: Secondary | ICD-10-CM

## 2019-06-04 DIAGNOSIS — Z510 Encounter for antineoplastic radiation therapy: Secondary | ICD-10-CM | POA: Diagnosis not present

## 2019-06-04 DIAGNOSIS — Z5111 Encounter for antineoplastic chemotherapy: Secondary | ICD-10-CM | POA: Diagnosis not present

## 2019-06-04 LAB — CMP (CANCER CENTER ONLY)
ALT: 19 U/L (ref 0–44)
AST: 17 U/L (ref 15–41)
Albumin: 3.4 g/dL — ABNORMAL LOW (ref 3.5–5.0)
Alkaline Phosphatase: 73 U/L (ref 38–126)
Anion gap: 10 (ref 5–15)
BUN: 17 mg/dL (ref 6–20)
CO2: 27 mmol/L (ref 22–32)
Calcium: 9.1 mg/dL (ref 8.9–10.3)
Chloride: 105 mmol/L (ref 98–111)
Creatinine: 0.83 mg/dL (ref 0.44–1.00)
GFR, Est AFR Am: >60 mL/min (ref >60)
GFR, Estimated: >60 mL/min (ref >60)
Glucose, Bld: 119 mg/dL — ABNORMAL HIGH (ref 70–99)
Potassium: 3.9 mmol/L (ref 3.5–5.1)
Sodium: 142 mmol/L (ref 135–145)
Total Bilirubin: <0.2 mg/dL — ABNORMAL LOW (ref 0.3–1.2)
Total Protein: 6.8 g/dL (ref 6.5–8.1)

## 2019-06-04 LAB — CBC WITH DIFFERENTIAL (CANCER CENTER ONLY)
Abs Immature Granulocytes: 0.03 10*3/uL (ref 0.00–0.07)
Basophils Absolute: 0 10*3/uL (ref 0.0–0.1)
Basophils Relative: 0 %
Eosinophils Absolute: 0 10*3/uL (ref 0.0–0.5)
Eosinophils Relative: 0 %
HCT: 42.3 % (ref 36.0–46.0)
Hemoglobin: 14 g/dL (ref 12.0–15.0)
Immature Granulocytes: 1 %
Lymphocytes Relative: 11 %
Lymphs Abs: 0.5 10*3/uL — ABNORMAL LOW (ref 0.7–4.0)
MCH: 30.3 pg (ref 26.0–34.0)
MCHC: 33.1 g/dL (ref 30.0–36.0)
MCV: 91.6 fL (ref 80.0–100.0)
Monocytes Absolute: 0.4 10*3/uL (ref 0.1–1.0)
Monocytes Relative: 9 %
Neutro Abs: 3.7 10*3/uL (ref 1.7–7.7)
Neutrophils Relative %: 79 %
Platelet Count: 228 10*3/uL (ref 150–400)
RBC: 4.62 MIL/uL (ref 3.87–5.11)
RDW: 15.1 % (ref 11.5–15.5)
WBC Count: 4.7 10*3/uL (ref 4.0–10.5)
nRBC: 0 % (ref 0.0–0.2)

## 2019-06-04 MED ORDER — SODIUM CHLORIDE 0.9 % IV SOLN
45.0000 mg/m2 | Freq: Once | INTRAVENOUS | Status: AC
  Administered 2019-06-04: 14:00:00 78 mg via INTRAVENOUS
  Filled 2019-06-04: qty 13

## 2019-06-04 MED ORDER — FAMOTIDINE IN NACL 20-0.9 MG/50ML-% IV SOLN
20.0000 mg | Freq: Once | INTRAVENOUS | Status: AC
  Administered 2019-06-04: 20 mg via INTRAVENOUS

## 2019-06-04 MED ORDER — SODIUM CHLORIDE 0.9 % IV SOLN
20.0000 mg | Freq: Once | INTRAVENOUS | Status: AC
  Administered 2019-06-04: 20 mg via INTRAVENOUS
  Filled 2019-06-04: qty 20

## 2019-06-04 MED ORDER — SODIUM CHLORIDE 0.9 % IV SOLN
50.0000 mg | Freq: Once | INTRAVENOUS | Status: AC
  Administered 2019-06-04: 14:00:00 50 mg via INTRAVENOUS
  Filled 2019-06-04: qty 1

## 2019-06-04 MED ORDER — SODIUM CHLORIDE 0.9 % IV SOLN
197.2000 mg | Freq: Once | INTRAVENOUS | Status: AC
  Administered 2019-06-04: 15:00:00 200 mg via INTRAVENOUS
  Filled 2019-06-04: qty 20

## 2019-06-04 MED ORDER — FAMOTIDINE IN NACL 20-0.9 MG/50ML-% IV SOLN
INTRAVENOUS | Status: AC
  Filled 2019-06-04: qty 50

## 2019-06-04 MED ORDER — SODIUM CHLORIDE 0.9 % IV SOLN
Freq: Once | INTRAVENOUS | Status: AC
  Filled 2019-06-04: qty 250

## 2019-06-04 MED ORDER — PALONOSETRON HCL INJECTION 0.25 MG/5ML
INTRAVENOUS | Status: AC
  Filled 2019-06-04: qty 5

## 2019-06-04 MED ORDER — DIPHENHYDRAMINE HCL 50 MG/ML IJ SOLN
INTRAMUSCULAR | Status: AC
  Filled 2019-06-04: qty 1

## 2019-06-04 MED ORDER — PALONOSETRON HCL INJECTION 0.25 MG/5ML
0.2500 mg | Freq: Once | INTRAVENOUS | Status: AC
  Administered 2019-06-04: 13:00:00 0.25 mg via INTRAVENOUS

## 2019-06-04 NOTE — Progress Notes (Signed)
Hematology and Oncology Follow Up Visit  Lauren Salazar [id number] 1961/04/25 59 y.o. 06/04/2019 11:57 AM College, [facility] @ [employer], [facility]*   Principle Diagnosis: 59 year old woman with:  1.  Bladder cancer diagnosed in July 2020.  He was found to T4aN0 squamous cell carcinoma after surgical resection..  2.  Stage IIIa adenocarcinoma of the lung diagnosed in August 2020.  She presented with right hilar mass and right lower paratracheal lymph node metastasis.  Prior Therapy:   She is status post robotic assisted laparoscopic cystectomy and pelvic exoneration including hysterectomy, bilateral salpingo-oophorectomy and ileoconduit diversion completed on February 01, 2019.  Current therapy: Radiation therapy with weekly chemotherapy utilizing carboplatin and paclitaxel.  Week 1 of chemotherapy started on May 14, 2019.  She is here for cycle 4 of therapy.  Interim History: Lauren Salazar presents today for a follow-up visit.  Since the last visit, she reports few complications related to chemotherapy.  She is reporting nausea and vomiting as well as excessive fatigue.  She has reported abdominal discomfort at times with an incisional hernia has been noted.  She denies any shortness of breath or cough.  He denies any wheezing or hemoptysis.     Medications: Unchanged on review. Current Outpatient Medications  Medication Sig Dispense Refill  . acetaminophen (TYLENOL) 500 MG tablet Take 1,000 mg by mouth every 8 (eight) hours as needed (pain).     . nicotine (NICODERM CQ - DOSED IN MG/24 HOURS) 21 mg/24hr patch Place 21 mg onto the skin daily.    . prochlorperazine (COMPAZINE) 10 MG tablet Take 1 tablet (10 mg total) by mouth every 6 (six) hours as needed for nausea or vomiting. (Patient not taking: Reported on 04/02/2019) 30 tablet 0   No current facility-administered medications for this visit.     Allergies: No Known Allergies      Physical  Exam:  Blood pressure 119/88, pulse (!) 107, temperature 98.5 F (36.9 C), temperature source Temporal, resp. rate 20, height 5\' 6"  (1.676 m), weight 137 lb 6.4 oz (62.3 kg), SpO2 100 %.   ECOG: 1     General appearance: Comfortable appearing without any discomfort Head: Normocephalic without any trauma Oropharynx: Mucous membranes are moist and pink without any thrush or ulcers. Eyes: Pupils are equal and round reactive to light. Lymph nodes: No cervical, supraclavicular, inguinal or axillary lymphadenopathy.   Heart:regular rate and rhythm.  S1 and S2 without leg edema. Lung: Clear without any rhonchi or wheezes.  No dullness to percussion. Abdomin: Soft, nontender, nondistended with good bowel sounds.  No hepatosplenomegaly. Musculoskeletal: No joint deformity or effusion.  Full range of motion noted. Neurological: No deficits noted on motor, sensory and deep tendon reflex exam. Skin: No petechial rash or dryness.  Appeared moist.        Lab Results: Lab Results  Component Value Date   WBC 4.8 05/28/2019   HGB 14.0 05/28/2019   HCT 42.9 05/28/2019   MCV 91.1 05/28/2019   PLT 257 05/28/2019     Chemistry      Component Value Date/Time   NA 141 05/28/2019 1205   K 3.9 05/28/2019 1205   CL 103 05/28/2019 1205   CO2 28 05/28/2019 1205   BUN 13 05/28/2019 1205   CREATININE 0.79 05/28/2019 1205      Component Value Date/Time   CALCIUM 9.5 05/28/2019 1205   ALKPHOS 80 05/28/2019 1205   AST 16 05/28/2019 1205   ALT 18 05/28/2019 1205  BILITOT 0.3 05/28/2019 1205         Impression and Plan:   59 year old woman with:    1.  Stage IIIa adenocarcinoma of the right lung diagnosed in August 2020.    She is currently receiving definitive therapy with radiation and weekly chemotherapy.  Risks and benefits of continuing this approach was discussed today.  Potential complications associated with chemotherapy were reiterated this would include nausea, vomiting,  myelosuppression and neutropenia.  After discussion today, she is okay with treatment today but would like to discontinue chemotherapy at this point.  She is getting close to the conclusion of her therapy although it is not ideal we have elected to discontinue chemotherapy after today.  Plan is to repeat imaging studies after completion of radiation.  1.  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  He has completed surgical resection without any additional adjuvant therapy.  The natural course of this disease at this time was reviewed.  Potential risk of relapse was assessed.  I recommended repeat imaging studies for staging purposes upon completing her treatment for lung cancer.   3.  Antiemetics: Compazine is available to her.  This should improve after discontinuation of chemotherapy.  4.  IV access: Peripheral veins are currently in use without any issues.  5.  Follow-up: She will follow up after obtaining imaging studies for staging purposes on the completion of radiation.   30  minutes was dedicated to the visit today.  The time was spent on reviewing her disease status, laboratory data discussion as well as future plan of care planning.  Zola Button, MD 2/9/202111:57 AM

## 2019-06-04 NOTE — Patient Instructions (Signed)
[facility] Discharge Instructions for Patients Receiving Chemotherapy  Today you received the following chemotherapy agents: paclitaxel and carboplatin.  To help prevent nausea and vomiting after your treatment, we encourage you to take your nausea medication as directed.   If you develop nausea and vomiting that is not controlled by your nausea medication, call the clinic.   BELOW ARE SYMPTOMS THAT SHOULD BE REPORTED IMMEDIATELY:  *FEVER GREATER THAN 100.5 F  *CHILLS WITH OR WITHOUT FEVER  NAUSEA AND VOMITING THAT IS NOT CONTROLLED WITH YOUR NAUSEA MEDICATION  *UNUSUAL SHORTNESS OF BREATH  *UNUSUAL BRUISING OR BLEEDING  TENDERNESS IN MOUTH AND THROAT WITH OR WITHOUT PRESENCE OF ULCERS  *URINARY PROBLEMS  *BOWEL PROBLEMS  UNUSUAL RASH Items with * indicate a potential emergency and should be followed up as soon as possible.  Feel free to call the clinic should you have any questions or concerns. The clinic phone number is [phone].  Please show the CHEMO ALERT CARD at check-in to the Emergency Department and triage nurse.   

## 2019-06-04 NOTE — Addendum Note (Signed)
Addended by: Wyatt Portela on: 06/04/2019 12:44 PM   Modules accepted: Orders

## 2019-06-05 ENCOUNTER — Ambulatory Visit
Admission: RE | Admit: 2019-06-05 | Discharge: 2019-06-05 | Disposition: A | Discharge status: Home / Self Care | Payer: Medicaid Other | Source: Ambulatory Visit | Attending: Radiation Oncology | Admitting: Radiation Oncology

## 2019-06-05 ENCOUNTER — Telehealth: Payer: Self-pay | Admitting: Oncology

## 2019-06-05 ENCOUNTER — Other Ambulatory Visit: Payer: Self-pay

## 2019-06-05 DIAGNOSIS — Z510 Encounter for antineoplastic radiation therapy: Secondary | ICD-10-CM | POA: Diagnosis not present

## 2019-06-05 NOTE — Telephone Encounter (Signed)
Scheduled appt per 2/9 los.  Sent a message to HIM pool to get a calendar mailed out.

## 2019-06-06 ENCOUNTER — Ambulatory Visit
Admission: RE | Admit: 2019-06-06 | Discharge: 2019-06-06 | Disposition: A | Discharge status: Home / Self Care | Payer: Medicaid Other | Source: Ambulatory Visit | Attending: Radiation Oncology | Admitting: Radiation Oncology

## 2019-06-06 ENCOUNTER — Other Ambulatory Visit: Payer: Self-pay

## 2019-06-06 DIAGNOSIS — Z510 Encounter for antineoplastic radiation therapy: Secondary | ICD-10-CM | POA: Diagnosis not present

## 2019-06-07 ENCOUNTER — Ambulatory Visit: Payer: Medicaid Other

## 2019-06-07 ENCOUNTER — Other Ambulatory Visit: Payer: Medicaid Other

## 2019-06-10 ENCOUNTER — Other Ambulatory Visit: Payer: Self-pay | Admitting: Radiation Oncology

## 2019-06-10 ENCOUNTER — Other Ambulatory Visit: Payer: Self-pay

## 2019-06-10 ENCOUNTER — Ambulatory Visit
Admission: RE | Admit: 2019-06-10 | Discharge: 2019-06-10 | Disposition: A | Discharge status: Home / Self Care | Payer: Medicaid Other | Source: Ambulatory Visit | Attending: Radiation Oncology | Admitting: Radiation Oncology

## 2019-06-10 DIAGNOSIS — Z510 Encounter for antineoplastic radiation therapy: Secondary | ICD-10-CM | POA: Diagnosis not present

## 2019-06-10 DIAGNOSIS — D229 Melanocytic nevi, unspecified: Secondary | ICD-10-CM

## 2019-06-10 DIAGNOSIS — C3431 Malignant neoplasm of lower lobe, right bronchus or lung: Secondary | ICD-10-CM

## 2019-06-10 MED ORDER — SONAFINE EX EMUL
1.0000 "application " | Freq: Two times a day (BID) | CUTANEOUS | Status: DC
  Administered 2019-06-10: 1 via TOPICAL

## 2019-06-11 ENCOUNTER — Ambulatory Visit: Payer: Medicaid Other

## 2019-06-11 ENCOUNTER — Ambulatory Visit
Admission: RE | Admit: 2019-06-11 | Discharge: 2019-06-11 | Disposition: A | Discharge status: Home / Self Care | Payer: Medicaid Other | Source: Ambulatory Visit | Attending: Radiation Oncology | Admitting: Radiation Oncology

## 2019-06-11 ENCOUNTER — Other Ambulatory Visit: Payer: Self-pay

## 2019-06-11 ENCOUNTER — Other Ambulatory Visit: Payer: Medicaid Other

## 2019-06-11 DIAGNOSIS — Z510 Encounter for antineoplastic radiation therapy: Secondary | ICD-10-CM | POA: Diagnosis not present

## 2019-06-12 ENCOUNTER — Other Ambulatory Visit: Payer: Self-pay

## 2019-06-12 ENCOUNTER — Encounter: Payer: Self-pay | Admitting: *Deleted

## 2019-06-12 ENCOUNTER — Ambulatory Visit
Admission: RE | Admit: 2019-06-12 | Discharge: 2019-06-12 | Disposition: A | Discharge status: Home / Self Care | Payer: Medicaid Other | Source: Ambulatory Visit | Attending: Radiation Oncology | Admitting: Radiation Oncology

## 2019-06-12 DIAGNOSIS — Z510 Encounter for antineoplastic radiation therapy: Secondary | ICD-10-CM | POA: Diagnosis not present

## 2019-06-12 NOTE — Progress Notes (Signed)
[name]  Clinical Social Work was referred byradiation oncology financial advocate for assessment of psychosocial needs; specifically financial needs.  Clinical Social Worker contacted patient by phone to offer support and assess for needs.   Lauren Salazar reported she has been out of work for over a year and has received occasional support from family and friends.   CSW discussed small programs such as [employer] and [employer].  Patient plans to pick up [employer] gift card and food bag today after radiation treatment.      Lauren Maine, LCSW  Clinical Social Worker [facility]

## 2019-06-13 ENCOUNTER — Ambulatory Visit: Payer: Medicaid Other

## 2019-06-14 ENCOUNTER — Other Ambulatory Visit: Payer: Medicaid Other

## 2019-06-14 ENCOUNTER — Ambulatory Visit
Admission: RE | Admit: 2019-06-14 | Discharge: 2019-06-14 | Disposition: A | Discharge status: Home / Self Care | Payer: Medicaid Other | Source: Ambulatory Visit | Attending: Radiation Oncology | Admitting: Radiation Oncology

## 2019-06-14 ENCOUNTER — Other Ambulatory Visit: Payer: Self-pay

## 2019-06-14 ENCOUNTER — Ambulatory Visit: Payer: Medicaid Other

## 2019-06-14 DIAGNOSIS — Z510 Encounter for antineoplastic radiation therapy: Secondary | ICD-10-CM | POA: Diagnosis not present

## 2019-06-17 ENCOUNTER — Ambulatory Visit
Admission: RE | Admit: 2019-06-17 | Discharge: 2019-06-17 | Disposition: A | Discharge status: Home / Self Care | Payer: Medicaid Other | Source: Ambulatory Visit | Attending: Radiation Oncology | Admitting: Radiation Oncology

## 2019-06-17 ENCOUNTER — Other Ambulatory Visit: Payer: Self-pay | Admitting: Radiation Oncology

## 2019-06-17 ENCOUNTER — Other Ambulatory Visit: Payer: Self-pay

## 2019-06-17 DIAGNOSIS — C3431 Malignant neoplasm of lower lobe, right bronchus or lung: Secondary | ICD-10-CM

## 2019-06-17 DIAGNOSIS — Z510 Encounter for antineoplastic radiation therapy: Secondary | ICD-10-CM | POA: Diagnosis not present

## 2019-06-17 MED ORDER — SUCRALFATE 1 G PO TABS: ORAL_TABLET | ORAL | 2 refills | Status: DC

## 2019-06-18 ENCOUNTER — Telehealth: Payer: Self-pay

## 2019-06-18 ENCOUNTER — Other Ambulatory Visit: Payer: Medicaid Other

## 2019-06-18 ENCOUNTER — Other Ambulatory Visit: Payer: Self-pay | Admitting: Radiation Oncology

## 2019-06-18 ENCOUNTER — Ambulatory Visit: Payer: Medicaid Other

## 2019-06-18 ENCOUNTER — Ambulatory Visit: Payer: Medicaid Other | Attending: Internal Medicine

## 2019-06-18 DIAGNOSIS — Z20822 Contact with and (suspected) exposure to covid-19: Secondary | ICD-10-CM

## 2019-06-18 DIAGNOSIS — C3431 Malignant neoplasm of lower lobe, right bronchus or lung: Secondary | ICD-10-CM

## 2019-06-18 MED ORDER — GABAPENTIN 300 MG PO CAPS: 300.0000 mg | ORAL_CAPSULE | Freq: Three times a day (TID) | ORAL | 1 refills | Status: DC

## 2019-06-18 NOTE — Telephone Encounter (Signed)
-----   Message from Wyatt Portela, MD sent at 06/18/2019 12:16 PM EST ----- I agree with delaying it for few weeks only to we can reschedule it till the end of March but not beyond.  Scan can be pushed to March 30 and MD follow-up after that.  Thanks. ----- Message ----- From: Tami Lin, RN Sent: 06/18/2019  12:00 PM EST To: Wyatt Portela, MD  Patient is scheduled for a PET scan on 07/01/19. Her last radiation treatment is scheduled for 06/24/19. Patient states Dr. Isidore Moos reccommended delaying the PET scan until the end of March to allow time from the last radiation treatment. Patient wants to know if you agree with pushing the PET scan to later in March or should she go ahead and have it done as scheduled on 07/01/19. Thanks, Lauren Salazar

## 2019-06-18 NOTE — Telephone Encounter (Signed)
Called patient and made her aware that Dr. Alen Blew is ok with moving the PET scan back to the end of March. Patient will call Central Scheduling to reschedule and other appointments will be changed once date of PET is known.

## 2019-06-18 NOTE — Telephone Encounter (Signed)
I called Lauren Salazar this morning to inform her that Dr. Isidore Moos has prescribed Gabapentin 300 mg three times daily to her preferred pharmacy. Dr. Isidore Moos recommends to take this medication around the clock if her pain is not relieved after trying tylenol and ibuprofen. Dr. Isidore Moos also advised to apply sonafine lotion 3 times daily to her skin over her back. I left all the above information on her voicemail with my direct number for her to call back if she had any further questions.

## 2019-06-19 ENCOUNTER — Telehealth: Payer: Self-pay

## 2019-06-19 ENCOUNTER — Ambulatory Visit: Payer: Medicaid Other

## 2019-06-19 ENCOUNTER — Telehealth: Payer: Self-pay | Admitting: Oncology

## 2019-06-19 LAB — NOVEL CORONAVIRUS, NAA: SARS-CoV-2, NAA: NOT DETECTED

## 2019-06-19 NOTE — Telephone Encounter (Signed)
I spoke to Lauren Salazar to inform her that her COVID test performed yesterday was negative. She was appreciative of the phone call, but reports not feeling well still. She believes she has a head cold and has been resting today. I advised that she was able to return today to receive her radiation therapy, but she declined as she is just not feeling well enough. She did tell me that she planned to come tomorrow. I have notified the [facility].

## 2019-06-19 NOTE — Telephone Encounter (Signed)
Scheduled appt per 2/24 sch message - pt is aware of appt date and time   

## 2019-06-20 ENCOUNTER — Ambulatory Visit
Admission: RE | Admit: 2019-06-20 | Discharge: 2019-06-20 | Disposition: A | Discharge status: Home / Self Care | Payer: Medicaid Other | Source: Ambulatory Visit | Attending: Radiation Oncology | Admitting: Radiation Oncology

## 2019-06-20 ENCOUNTER — Other Ambulatory Visit: Payer: Self-pay

## 2019-06-20 ENCOUNTER — Ambulatory Visit: Payer: Medicaid Other

## 2019-06-20 DIAGNOSIS — Z510 Encounter for antineoplastic radiation therapy: Secondary | ICD-10-CM | POA: Diagnosis not present

## 2019-06-21 ENCOUNTER — Other Ambulatory Visit: Payer: Medicaid Other

## 2019-06-21 ENCOUNTER — Ambulatory Visit: Payer: Medicaid Other

## 2019-06-21 ENCOUNTER — Ambulatory Visit
Admission: RE | Admit: 2019-06-21 | Discharge: 2019-06-21 | Disposition: A | Discharge status: Home / Self Care | Payer: Medicaid Other | Source: Ambulatory Visit | Attending: Radiation Oncology | Admitting: Radiation Oncology

## 2019-06-21 DIAGNOSIS — Z510 Encounter for antineoplastic radiation therapy: Secondary | ICD-10-CM | POA: Diagnosis not present

## 2019-06-24 ENCOUNTER — Ambulatory Visit
Admission: RE | Admit: 2019-06-24 | Discharge: 2019-06-24 | Disposition: A | Discharge status: Home / Self Care | Payer: Medicaid Other | Source: Ambulatory Visit | Attending: Radiation Oncology | Admitting: Radiation Oncology

## 2019-06-24 ENCOUNTER — Ambulatory Visit: Payer: Medicaid Other

## 2019-06-24 ENCOUNTER — Other Ambulatory Visit: Payer: Self-pay

## 2019-06-24 DIAGNOSIS — Z510 Encounter for antineoplastic radiation therapy: Secondary | ICD-10-CM | POA: Insufficient documentation

## 2019-06-24 DIAGNOSIS — C3431 Malignant neoplasm of lower lobe, right bronchus or lung: Secondary | ICD-10-CM | POA: Diagnosis not present

## 2019-06-25 ENCOUNTER — Other Ambulatory Visit: Payer: Medicaid Other

## 2019-06-25 ENCOUNTER — Ambulatory Visit: Payer: Medicaid Other

## 2019-06-25 ENCOUNTER — Ambulatory Visit
Admission: RE | Admit: 2019-06-25 | Discharge: 2019-06-25 | Disposition: A | Discharge status: Home / Self Care | Payer: Medicaid Other | Source: Ambulatory Visit | Attending: Radiation Oncology | Admitting: Radiation Oncology

## 2019-06-25 DIAGNOSIS — Z510 Encounter for antineoplastic radiation therapy: Secondary | ICD-10-CM | POA: Diagnosis not present

## 2019-06-26 ENCOUNTER — Other Ambulatory Visit: Payer: Self-pay

## 2019-06-26 ENCOUNTER — Encounter: Payer: Self-pay | Admitting: Radiation Oncology

## 2019-06-26 ENCOUNTER — Ambulatory Visit
Admission: RE | Admit: 2019-06-26 | Discharge: 2019-06-26 | Disposition: A | Discharge status: Home / Self Care | Payer: Medicaid Other | Source: Ambulatory Visit | Attending: Radiation Oncology | Admitting: Radiation Oncology

## 2019-06-26 ENCOUNTER — Ambulatory Visit: Payer: Medicaid Other

## 2019-06-26 DIAGNOSIS — Z510 Encounter for antineoplastic radiation therapy: Secondary | ICD-10-CM | POA: Diagnosis not present

## 2019-06-28 ENCOUNTER — Other Ambulatory Visit: Payer: Medicaid Other

## 2019-07-01 ENCOUNTER — Encounter (HOSPITAL_COMMUNITY): Payer: Self-pay

## 2019-07-01 ENCOUNTER — Other Ambulatory Visit: Payer: Self-pay | Admitting: Oncology

## 2019-07-01 DIAGNOSIS — C3490 Malignant neoplasm of unspecified part of unspecified bronchus or lung: Secondary | ICD-10-CM

## 2019-07-03 ENCOUNTER — Ambulatory Visit: Payer: Medicaid Other | Admitting: Oncology

## 2019-07-04 ENCOUNTER — Other Ambulatory Visit: Payer: Self-pay

## 2019-07-04 ENCOUNTER — Telehealth: Payer: Self-pay

## 2019-07-04 NOTE — Telephone Encounter (Signed)
-----   Message from Wyatt Portela, MD sent at 07/04/2019 11:27 AM EST ----- Please cancel PET. Thanks ----- Message ----- From: Tami Lin, RN Sent: 07/04/2019  11:15 AM EST To: Wyatt Portela, MD  Patient is scheduled for a PET scan on 3/30 and a CT abd/chest/pelvis on 3/29. From what I can see the PET was denied and went to peer to peer review. Just confirming that you want the PET scan canceled.  Lanelle Bal

## 2019-07-04 NOTE — Telephone Encounter (Signed)
Patient made aware that PET scan has been canceled and that she needs to keep the CT appointment on 3/29.

## 2019-07-22 ENCOUNTER — Other Ambulatory Visit: Payer: Self-pay

## 2019-07-22 ENCOUNTER — Ambulatory Visit (HOSPITAL_COMMUNITY)
Admission: RE | Admit: 2019-07-22 | Discharge: 2019-07-22 | Disposition: A | Discharge status: Home / Self Care | Payer: Medicaid Other | Source: Ambulatory Visit | Attending: Oncology | Admitting: Oncology

## 2019-07-22 ENCOUNTER — Encounter (HOSPITAL_COMMUNITY): Payer: Self-pay

## 2019-07-22 ENCOUNTER — Inpatient Hospital Stay: Payer: Medicaid Other | Attending: Oncology

## 2019-07-22 DIAGNOSIS — C3490 Malignant neoplasm of unspecified part of unspecified bronchus or lung: Secondary | ICD-10-CM

## 2019-07-22 DIAGNOSIS — C3432 Malignant neoplasm of lower lobe, left bronchus or lung: Secondary | ICD-10-CM | POA: Insufficient documentation

## 2019-07-22 LAB — CBC WITH DIFFERENTIAL (CANCER CENTER ONLY)
Abs Immature Granulocytes: 0.02 10*3/uL (ref 0.00–0.07)
Basophils Absolute: 0.1 10*3/uL (ref 0.0–0.1)
Basophils Relative: 1 %
Eosinophils Absolute: 0.1 10*3/uL (ref 0.0–0.5)
Eosinophils Relative: 1 %
HCT: 41.6 % (ref 36.0–46.0)
Hemoglobin: 13.6 g/dL (ref 12.0–15.0)
Immature Granulocytes: 0 %
Lymphocytes Relative: 9 %
Lymphs Abs: 0.7 10*3/uL (ref 0.7–4.0)
MCH: 32 pg (ref 26.0–34.0)
MCHC: 32.7 g/dL (ref 30.0–36.0)
MCV: 97.9 fL (ref 80.0–100.0)
Monocytes Absolute: 0.8 10*3/uL (ref 0.1–1.0)
Monocytes Relative: 10 %
Neutro Abs: 6.5 10*3/uL (ref 1.7–7.7)
Neutrophils Relative %: 79 %
Platelet Count: 318 10*3/uL (ref 150–400)
RBC: 4.25 MIL/uL (ref 3.87–5.11)
RDW: 17.3 % — ABNORMAL HIGH (ref 11.5–15.5)
WBC Count: 8.2 10*3/uL (ref 4.0–10.5)
nRBC: 0 % (ref 0.0–0.2)

## 2019-07-22 LAB — CMP (CANCER CENTER ONLY)
ALT: 10 U/L (ref 0–44)
AST: 12 U/L — ABNORMAL LOW (ref 15–41)
Albumin: 3.4 g/dL — ABNORMAL LOW (ref 3.5–5.0)
Alkaline Phosphatase: 93 U/L (ref 38–126)
Anion gap: 10 (ref 5–15)
BUN: 12 mg/dL (ref 6–20)
CO2: 25 mmol/L (ref 22–32)
Calcium: 8.9 mg/dL (ref 8.9–10.3)
Chloride: 106 mmol/L (ref 98–111)
Creatinine: 0.9 mg/dL (ref 0.44–1.00)
GFR, Est AFR Am: >60 mL/min (ref >60)
GFR, Estimated: >60 mL/min (ref >60)
Glucose, Bld: 98 mg/dL (ref 70–99)
Potassium: 4 mmol/L (ref 3.5–5.1)
Sodium: 141 mmol/L (ref 135–145)
Total Bilirubin: 0.3 mg/dL (ref 0.3–1.2)
Total Protein: 6.8 g/dL (ref 6.5–8.1)

## 2019-07-22 MED ORDER — IOHEXOL 300 MG/ML  SOLN
100.0000 mL | Freq: Once | INTRAMUSCULAR | Status: AC | PRN
  Administered 2019-07-22: 100 mL via INTRAVENOUS

## 2019-07-22 MED ORDER — SODIUM CHLORIDE (PF) 0.9 % IJ SOLN
INTRAMUSCULAR | Status: AC
  Filled 2019-07-22: qty 50

## 2019-07-22 MED ORDER — SODIUM CHLORIDE 0.9 % IV SOLN
INTRAVENOUS | Status: AC
  Filled 2019-07-22: qty 250

## 2019-07-23 ENCOUNTER — Other Ambulatory Visit (HOSPITAL_COMMUNITY): Payer: Self-pay

## 2019-07-23 ENCOUNTER — Other Ambulatory Visit: Payer: Medicaid Other

## 2019-07-25 ENCOUNTER — Other Ambulatory Visit: Payer: Self-pay

## 2019-07-25 ENCOUNTER — Inpatient Hospital Stay: Payer: Medicaid Other | Attending: Oncology | Admitting: Oncology

## 2019-07-25 VITALS — BP 126/83 | HR 97 | Temp 98.3°F | Resp 20 | Ht 66.0 in | Wt 138.7 lb

## 2019-07-25 DIAGNOSIS — C3490 Malignant neoplasm of unspecified part of unspecified bronchus or lung: Secondary | ICD-10-CM

## 2019-07-25 DIAGNOSIS — Z8551 Personal history of malignant neoplasm of bladder: Secondary | ICD-10-CM | POA: Insufficient documentation

## 2019-07-25 DIAGNOSIS — J439 Emphysema, unspecified: Secondary | ICD-10-CM | POA: Diagnosis not present

## 2019-07-25 DIAGNOSIS — Z9221 Personal history of antineoplastic chemotherapy: Secondary | ICD-10-CM | POA: Insufficient documentation

## 2019-07-25 DIAGNOSIS — Z90722 Acquired absence of ovaries, bilateral: Secondary | ICD-10-CM | POA: Insufficient documentation

## 2019-07-25 DIAGNOSIS — I700 Atherosclerosis of aorta: Secondary | ICD-10-CM | POA: Diagnosis not present

## 2019-07-25 DIAGNOSIS — D3501 Benign neoplasm of right adrenal gland: Secondary | ICD-10-CM | POA: Diagnosis not present

## 2019-07-25 DIAGNOSIS — C3432 Malignant neoplasm of lower lobe, left bronchus or lung: Secondary | ICD-10-CM | POA: Insufficient documentation

## 2019-07-25 DIAGNOSIS — K429 Umbilical hernia without obstruction or gangrene: Secondary | ICD-10-CM | POA: Diagnosis not present

## 2019-07-25 DIAGNOSIS — D3502 Benign neoplasm of left adrenal gland: Secondary | ICD-10-CM | POA: Diagnosis not present

## 2019-07-25 DIAGNOSIS — Z923 Personal history of irradiation: Secondary | ICD-10-CM | POA: Diagnosis not present

## 2019-07-25 DIAGNOSIS — Z79899 Other long term (current) drug therapy: Secondary | ICD-10-CM | POA: Insufficient documentation

## 2019-07-25 DIAGNOSIS — Z90710 Acquired absence of both cervix and uterus: Secondary | ICD-10-CM | POA: Diagnosis not present

## 2019-07-25 NOTE — Progress Notes (Signed)
ON PATHWAY REGIMEN - Non-Small Cell Lung  No Change  Continue With Treatment as Ordered.     Administer weekly:     Paclitaxel      Carboplatin   **Always confirm dose/schedule in your pharmacy ordering system**  Patient Characteristics: Stage III - Unresectable, PS = 0, 1 AJCC T Category: TX Current Disease Status: No Distant Mets or Local Recurrence AJCC N Category: NX AJCC M Category: M0 AJCC 8 Stage Grouping: IIIA ECOG Performance Status: 1 Intent of Therapy: Curative Intent, Discussed with Patient

## 2019-07-25 NOTE — Progress Notes (Signed)
Hematology and Oncology Follow Up Visit  Lauren Salazar [id number] [birth year] 59 y.o. 07/25/2019 3:43 PM [facility], [name] [employer], [facility]   Principle Diagnosis: 59 year old woman with:  1.  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   2.  T4N2 right lung cancer noted in August 2020.  She was found to have stage IIIA adenocarcinoma after presenting with right hilar mass.   Prior Therapy:   She is status post robotic assisted laparoscopic cystectomy and pelvic exoneration including hysterectomy, bilateral salpingo-oophorectomy and ileoconduit diversion completed on February 01, 2019.  Radiation therapy with weekly chemotherapy utilizing carboplatin and paclitaxel.  Week 1 of chemotherapy started on May 14, 2019.  Therapy concluded in February 2021.  Current therapy: Active surveillance.  Interim History: Lauren Salazar is here for return evaluation.  Since her last visit, she reports no major changes in her health.  She has recovered from her definitive treatment with radiation and chemotherapy without any residual toxicities.  She denies any nausea, vomiting, pain.  She denies any weight loss or appetite changes.  She denied any shortness of breath or difficulty breathing.  She denies any abdominal distention or weight loss.     Medications: Without any changes on review. Current Outpatient Medications  Medication Sig Dispense Refill  . acetaminophen (TYLENOL) 500 MG tablet Take 1,000 mg by mouth every 8 (eight) hours as needed (pain).     Marland Kitchen gabapentin (NEURONTIN) 300 MG capsule Take 1 capsule (300 mg total) by mouth 3 (three) times daily. Take for significant back pain around the clock. 42 capsule 1  . nicotine (NICODERM CQ - DOSED IN MG/24 HOURS) 21 mg/24hr patch Place 21 mg onto the skin daily.    . prochlorperazine (COMPAZINE) 10 MG tablet Take 1 tablet (10 mg total) by mouth every 6 (six) hours as needed for nausea  or vomiting. (Patient not taking: Reported on 04/02/2019) 30 tablet 0  . sucralfate (CARAFATE) 1 g tablet Dissolve 1 tablet in 10 mL H20 and swallow 30 min prior to meals and bedtime PRN heartburn. 40 tablet 2   No current facility-administered medications for this visit.     Allergies: No Known Allergies      Physical Exam: Blood pressure 126/83, pulse 97, temperature 98.3 F (36.8 C), temperature source Temporal, resp. rate 20, height 5\' 6"  (1.676 m), weight 138 lb 11.2 oz (62.9 kg), SpO2 100 %.    ECOG: 1   General appearance: Alert, awake without any distress. Head: Atraumatic without abnormalities Oropharynx: Without any thrush or ulcers. Eyes: No scleral icterus. Lymph nodes: No lymphadenopathy noted in the cervical, supraclavicular, or axillary nodes Heart:regular rate and rhythm, without any murmurs or gallops.   Lung: Clear to auscultation without any rhonchi, wheezes or dullness to percussion. Abdomin: Soft, nontender without any shifting dullness or ascites. Musculoskeletal: No clubbing or cyanosis. Neurological: No motor or sensory deficits. Skin: No rashes or lesions.       Lab Results: Lab Results  Component Value Date   WBC 8.2 07/22/2019   HGB 13.6 07/22/2019   HCT 41.6 07/22/2019   MCV 97.9 07/22/2019   PLT 318 07/22/2019     Chemistry      Component Value Date/Time   NA 141 07/22/2019 0950   K 4.0 07/22/2019 0950   CL 106 07/22/2019 0950   CO2 25 07/22/2019 0950   BUN 12 07/22/2019 0950   CREATININE 0.90 07/22/2019 0950  Component Value Date/Time   CALCIUM 8.9 07/22/2019 0950   ALKPHOS 93 07/22/2019 0950   AST 12 (L) 07/22/2019 0950   ALT 10 07/22/2019 0950   BILITOT 0.3 07/22/2019 0950      IMPRESSION: 1. Interval decrease in size of the right upper and right lower lobe pulmonary nodules. Mediastinal lymph nodes have also decreased in size since PET-CT of 01/16/2019. No new or progressive findings in the chest. 2. No  evidence for metastatic disease in the abdomen or pelvis. 3. Stable bilateral adrenal adenomas. 4. Small paraumbilical hernia contains short segment small bowel without complicating features. 5. Aortic Atherosclerosis (ICD10-I70.0) and Emphysema (ICD10-J43.9).    Impression and Plan:   59 year old woman with:    1.  T4N2 right lung cancer diagnosed in August 2020.  She was found to have stage IIIA disease.     She is status post definitive therapy outlined above without any major complications.  Imaging studies obtained on July 22, 2019 were personally reviewed and discussed with the patient.  She has interval improvement in her pulmonary nodules as well as her lymphadenopathy.  No evidence of distant metastasis at this time.  The role of consolidation immunotherapy were reviewed at this time.  Complications associated with durvalumab were reviewed including nausea, fatigue and immune mediated complications.  After discussion she is agreeable to proceed and we will start that in the near future.   1.  Bladder cancer diagnosed in July 2020.  She was found to have T4N0 squamous cell carcinoma and status post surgical resection.  Imaging studies at that time did not show any evidence of metastatic disease.  I recommended continued active surveillance at this time.   3.  Antiemetics: No residual nausea or vomiting at this time patient refilled.  4.  IV access: Peripheral veins used in the past and no issues reported.  Port-A-Cath insertion was discussed and deferred for the time being.  5.  Immunotherapy complications: Continue to educate her about potential pneumonitis, colitis or thyroid disease.  6.  Follow-up: Will be in the near future to start durvalumab   30  minutes were spent on this encounter today.  Time was dedicated to reviewing imaging studies, laboratory data discussing treatment options and future plan of care.  Zola Button, MD 4/1/20213:43 PM

## 2019-07-26 ENCOUNTER — Telehealth: Payer: Self-pay | Admitting: Oncology

## 2019-07-26 NOTE — Telephone Encounter (Signed)
Scheduled per los. Called and left msg. Mailed printout  °

## 2019-07-31 NOTE — Progress Notes (Signed)
Pharmacist Chemotherapy Monitoring - Follow Up Assessment    I verify that I have reviewed each item in the below checklist:  . Regimen for the patient is scheduled for the appropriate day and plan matches scheduled date. Marland Kitchen Appropriate non-routine labs are ordered dependent on drug ordered. . If applicable, additional medications reviewed and ordered per protocol based on lifetime cumulative doses and/or treatment regimen.   Plan for follow-up and/or issues identified: No . I-vent associated with next due treatment: No . MD and/or nursing notified: No  Acquanetta Belling 07/31/2019 11:55 AM

## 2019-08-06 ENCOUNTER — Inpatient Hospital Stay: Payer: Medicaid Other

## 2019-08-07 ENCOUNTER — Other Ambulatory Visit: Payer: Self-pay | Admitting: Radiation Oncology

## 2019-08-07 ENCOUNTER — Encounter: Payer: Self-pay | Admitting: Radiation Oncology

## 2019-08-07 DIAGNOSIS — R059 Cough, unspecified: Secondary | ICD-10-CM

## 2019-08-07 DIAGNOSIS — R05 Cough: Secondary | ICD-10-CM

## 2019-08-07 DIAGNOSIS — C3431 Malignant neoplasm of lower lobe, right bronchus or lung: Secondary | ICD-10-CM

## 2019-08-07 MED ORDER — GUAIFENESIN-DM 100-10 MG/5ML PO SYRP: 5.0000 mL | ORAL_SOLUTION | ORAL | 0 refills | Status: DC | PRN

## 2019-08-07 NOTE — Progress Notes (Signed)
I called the patient today about their upcoming follow-up appointment in radiation oncology.   Given the state of the  COVID-19 pandemic, concerning case numbers in our community, and guidance from [facility], I offered a phone assessment with the patient to determine if coming to the clinic was necessary. The patient accepted.  I let the patient know that I had spoken with Dr. Isidore Moos, and she wanted them to know the importance of washing their hands for at least 20 seconds at a time, especially after going out in public, and before they eat. Limit going out in public whenever possible. Do not touch your face, unless your hands are clean, such as when bathing. Get plenty of rest, eat well, and stay hydrated. Patient verbalized understanding and agreement.  Symptomatically, the patient is doing relatively well. They did report they "caught a cold" from their grandkids over Easter weekend, and have been dealing with a persistent cough/fatigue. She's been taking OTC Mucinex but doesn't feel like the cough is improving. She is interested in seeing if Dr. Isidore Moos has any recommendations or prescriptions she would write. Otherwise she feels like she is doing well and doesn't want to come into the clinic for a visit.  All questions were answered to the patient's satisfaction.  I encouraged the patient to call with any further questions. Otherwise, the plan is continue treatment under care of Dr. Zola Button and follow up with Dr. Isidore Moos in 2 months to ensure patient hasn't developed radiation pneumonitis.    Patient is pleased with this plan, and we will cancel their upcoming follow-up to reduce the risk of COVID-19 transmission.

## 2019-08-07 NOTE — Progress Notes (Signed)
  Patient Name: Lauren Salazar MRN: [id number] DOB: 1960/07/01 Referring Physician: Zola Button (Profile Not Attached) Date of Service: 06/26/2019 [facility], [place]                                                        End Of Treatment Note  Diagnoses: C34.31-Malignant neoplasm of lower lobe, right bronchus or lung  Cancer Staging: Cancer Staging Cancer of lower lobe of right lung [facility]) Staging form: Lung, AJCC 8th Edition - Clinical stage from 04/02/2019: Stage IIIB (cT4, cN2, cM0) - Signed by [name], MD on 04/02/2019  Intent: Curative  Radiation Treatment Dates: 05/06/2019 through 06/26/2019 Site Technique Total Dose (Gy) Dose per Fx (Gy) Completed Fx Beam Energies  Lung, Right: Lung_Rt 3D 66/66 2 33/33 6X, 10X   Narrative: The patient tolerated radiation therapy relatively well. She received concurrent chemotherapy.  Plan: The patient will follow-up with radiation oncology in 1 mo, or as needed. Med/onc will order imaging to monitor her lung and bladder cancer in the near future.   -----------------------------------  [name], MD

## 2019-08-08 ENCOUNTER — Telehealth: Payer: Self-pay | Admitting: *Deleted

## 2019-08-08 ENCOUNTER — Telehealth: Payer: Self-pay | Admitting: Radiation Oncology

## 2019-08-08 NOTE — Telephone Encounter (Signed)
CALLED PATIENT TO INFORM OF FU WITH DR. Isidore Moos ON 10-16-19 @ 11 AM, LVM FOR A RETURN CALL

## 2019-08-08 NOTE — Telephone Encounter (Signed)
Phoned patient as requested by Dr. Isidore Moos. No answer. Left the following voicemail: Explained that per Dr. Isidore Moos colds are not widely going around at this time. Explained that Dr. Isidore Moos questions if she has been fully vaccinated for covid. Explained if she hasn't then I could help her get signed up. Explained Dr. Isidore Moos did send in a cough syrup to her pharmacy. Requested she return my call and provided her with my direct number. Awaiting call back.

## 2019-08-09 ENCOUNTER — Inpatient Hospital Stay
Admission: RE | Admit: 2019-08-09 | Discharge: 2019-08-09 | Disposition: A | Discharge status: Home / Self Care | Payer: Self-pay | Source: Ambulatory Visit | Attending: Radiation Oncology | Admitting: Radiation Oncology

## 2019-08-09 ENCOUNTER — Telehealth: Payer: Self-pay | Admitting: *Deleted

## 2019-08-09 NOTE — Telephone Encounter (Signed)
RETURNED PATIENT'S PHONE CALL, LVM FOR A RETURN CALL 

## 2019-08-12 ENCOUNTER — Telehealth: Payer: Self-pay | Admitting: *Deleted

## 2019-08-12 ENCOUNTER — Inpatient Hospital Stay: Payer: Medicaid Other

## 2019-08-12 NOTE — Telephone Encounter (Signed)
Pt called c/o weakness, shakiness, fatigue, head congestion and cough. Right side pain due to excessive coughing. Has had these symptoms for almost 2 weeks now. Believes it came from taking care of her sick grandchildren that she took care of prior. Consulted with Sandi Mealy, PA, suggested pt be seen at the respiratory clinic. Available open appt's on Thursday only. Pt stated she will call her PCP to try and get an earlier appt. Advised if there were any other concerns or questions to call office

## 2019-08-14 NOTE — Progress Notes (Signed)
Pharmacist Chemotherapy Monitoring - Initial Assessment    Anticipated start date: 08/20/2019   Regimen:  . Are orders appropriate based on the patient's diagnosis, regimen, and cycle? Yes . Does the plan date match the patient's scheduled date? Yes . Is the sequencing of drugs appropriate? Yes . Are the premedications appropriate for the patient's regimen? Yes . Prior Authorization for treatment is: Approved o If applicable, is the correct biosimilar selected based on the patient's insurance? not applicable  Organ Function and Labs: Marland Kitchen Are dose adjustments needed based on the patient's renal function, hepatic function, or hematologic function? Yes . Are appropriate labs ordered prior to the start of patient's treatment? Yes . Other organ system assessment, if indicated: N/A . The following baseline labs, if indicated, have been ordered: durvalumab: baseline TSH +/- T4  Dose Assessment: . Are the drug doses appropriate? Yes . Are the following correct: o Drug concentrations Yes o IV fluid compatible with drug Yes o Administration routes Yes o Timing of therapy Yes . If applicable, does the patient have documented access for treatment and/or plans for port-a-cath placement? not applicable . If applicable, have lifetime cumulative doses been properly documented and assessed? not applicable Lifetime Dose Tracking  . Carboplatin: 780 mg = 0.01 % of the maximum lifetime dose of 999,999,999 mg  o   Toxicity Monitoring/Prevention: . The patient has the following take home antiemetics prescribed: N/A . The patient has the following take home medications prescribed: N/A . Medication allergies and previous infusion related reactions, if applicable, have been reviewed and addressed. No . The patient's current medication list has been assessed for drug-drug interactions with their chemotherapy regimen. no significant drug-drug interactions were identified on review.  Order Review: . Are the  treatment plan orders signed? Yes . Is the patient scheduled to see a provider prior to their treatment? No  I verify that I have reviewed each item in the above checklist and answered each question accordingly.  Morna Flud D 08/14/2019 11:34 AM

## 2019-08-15 ENCOUNTER — Ambulatory Visit: Payer: Medicaid Other

## 2019-08-16 ENCOUNTER — Telehealth: Payer: Self-pay

## 2019-08-16 NOTE — Telephone Encounter (Signed)
Left VM to check on patient and see how she was doing, and if she was able to be seen by PCP or provider at respiratory clinic. Requested return call with update so I can update Dr. Isidore Moos.

## 2019-08-16 NOTE — Telephone Encounter (Signed)
Received VM from patient that she was able to be seen by her family practice provider yesterday 08/15/19, and they told her they think she has bronchitis. They started her on antibiotics and a strong cough suppresant. She stated she is "still feeling rough, but can tell a difference having started the medicine". She stated she appreciated previous call and would keep staff updated if she experienced any other major changes.   Will route message to Dr. Isidore Moos and Dr. Alen Blew for their awareness.

## 2019-08-20 ENCOUNTER — Inpatient Hospital Stay: Payer: Medicaid Other

## 2019-08-28 NOTE — Progress Notes (Signed)
Pharmacist Chemotherapy Monitoring - Follow Up Assessment    I verify that I have reviewed each item in the below checklist:  . Regimen for the patient is scheduled for the appropriate day and plan matches scheduled date. Marland Kitchen Appropriate non-routine labs are ordered dependent on drug ordered. . If applicable, additional medications reviewed and ordered per protocol based on lifetime cumulative doses and/or treatment regimen.   Plan for follow-up and/or issues identified: No . I-vent associated with next due treatment: No . MD and/or nursing notified: No  Racquel Arkin K 08/28/2019 11:50 AM

## 2019-09-03 ENCOUNTER — Inpatient Hospital Stay: Payer: Medicaid Other

## 2019-09-03 ENCOUNTER — Other Ambulatory Visit: Payer: Self-pay

## 2019-09-03 ENCOUNTER — Inpatient Hospital Stay: Payer: Medicaid Other | Attending: Oncology

## 2019-09-03 ENCOUNTER — Inpatient Hospital Stay (HOSPITAL_BASED_OUTPATIENT_CLINIC_OR_DEPARTMENT_OTHER): Payer: Medicaid Other | Admitting: Oncology

## 2019-09-03 ENCOUNTER — Ambulatory Visit: Payer: Medicaid Other | Admitting: Oncology

## 2019-09-03 ENCOUNTER — Other Ambulatory Visit: Payer: Medicaid Other

## 2019-09-03 ENCOUNTER — Ambulatory Visit: Payer: Medicaid Other

## 2019-09-03 VITALS — HR 103

## 2019-09-03 VITALS — BP 137/85 | HR 111 | Temp 99.1°F | Resp 18 | Ht 66.0 in | Wt 136.0 lb

## 2019-09-03 DIAGNOSIS — R05 Cough: Secondary | ICD-10-CM | POA: Insufficient documentation

## 2019-09-03 DIAGNOSIS — C3431 Malignant neoplasm of lower lobe, right bronchus or lung: Secondary | ICD-10-CM | POA: Diagnosis present

## 2019-09-03 DIAGNOSIS — R0781 Pleurodynia: Secondary | ICD-10-CM | POA: Insufficient documentation

## 2019-09-03 DIAGNOSIS — Z5112 Encounter for antineoplastic immunotherapy: Secondary | ICD-10-CM | POA: Insufficient documentation

## 2019-09-03 DIAGNOSIS — C3490 Malignant neoplasm of unspecified part of unspecified bronchus or lung: Secondary | ICD-10-CM

## 2019-09-03 DIAGNOSIS — Z9221 Personal history of antineoplastic chemotherapy: Secondary | ICD-10-CM | POA: Diagnosis not present

## 2019-09-03 DIAGNOSIS — Z8551 Personal history of malignant neoplasm of bladder: Secondary | ICD-10-CM | POA: Diagnosis not present

## 2019-09-03 DIAGNOSIS — Z923 Personal history of irradiation: Secondary | ICD-10-CM | POA: Diagnosis not present

## 2019-09-03 LAB — CMP (CANCER CENTER ONLY)
ALT: 12 U/L (ref 0–44)
AST: 12 U/L — ABNORMAL LOW (ref 15–41)
Albumin: 3.5 g/dL (ref 3.5–5.0)
Alkaline Phosphatase: 112 U/L (ref 38–126)
Anion gap: 11 (ref 5–15)
BUN: 12 mg/dL (ref 6–20)
CO2: 26 mmol/L (ref 22–32)
Calcium: 9.4 mg/dL (ref 8.9–10.3)
Chloride: 105 mmol/L (ref 98–111)
Creatinine: 0.89 mg/dL (ref 0.44–1.00)
GFR, Est AFR Am: >60 mL/min (ref >60)
GFR, Estimated: >60 mL/min (ref >60)
Glucose, Bld: 136 mg/dL — ABNORMAL HIGH (ref 70–99)
Potassium: 3.9 mmol/L (ref 3.5–5.1)
Sodium: 142 mmol/L (ref 135–145)
Total Bilirubin: 0.3 mg/dL (ref 0.3–1.2)
Total Protein: 7.3 g/dL (ref 6.5–8.1)

## 2019-09-03 LAB — CBC WITH DIFFERENTIAL (CANCER CENTER ONLY)
Abs Immature Granulocytes: 0.03 10*3/uL (ref 0.00–0.07)
Basophils Absolute: 0 10*3/uL (ref 0.0–0.1)
Basophils Relative: 1 %
Eosinophils Absolute: 0.1 10*3/uL (ref 0.0–0.5)
Eosinophils Relative: 1 %
HCT: 44 % (ref 36.0–46.0)
Hemoglobin: 14.3 g/dL (ref 12.0–15.0)
Immature Granulocytes: 1 %
Lymphocytes Relative: 12 %
Lymphs Abs: 0.8 10*3/uL (ref 0.7–4.0)
MCH: 31.8 pg (ref 26.0–34.0)
MCHC: 32.5 g/dL (ref 30.0–36.0)
MCV: 98 fL (ref 80.0–100.0)
Monocytes Absolute: 0.5 10*3/uL (ref 0.1–1.0)
Monocytes Relative: 8 %
Neutro Abs: 5.1 10*3/uL (ref 1.7–7.7)
Neutrophils Relative %: 77 %
Platelet Count: 327 10*3/uL (ref 150–400)
RBC: 4.49 MIL/uL (ref 3.87–5.11)
RDW: 13.1 % (ref 11.5–15.5)
WBC Count: 6.6 10*3/uL (ref 4.0–10.5)
nRBC: 0 % (ref 0.0–0.2)

## 2019-09-03 LAB — TSH: TSH: 1.12 u[IU]/mL (ref 0.308–3.960)

## 2019-09-03 MED ORDER — SODIUM CHLORIDE 0.9 % IV SOLN
10.0000 mg/kg | Freq: Once | INTRAVENOUS | Status: AC
  Administered 2019-09-03: 620 mg via INTRAVENOUS
  Filled 2019-09-03: qty 2.4

## 2019-09-03 MED ORDER — SODIUM CHLORIDE 0.9 % IV SOLN
Freq: Once | INTRAVENOUS | Status: AC
  Filled 2019-09-03: qty 250

## 2019-09-03 NOTE — Progress Notes (Signed)
First time Infimzi today. Pt. Tolerated well without complaint.

## 2019-09-03 NOTE — Patient Instructions (Signed)
Durvalumab injection What is this medicine? DURVALUMAB (dur VAL ue mab) is a monoclonal antibody. It is used to treat urothelial cancer and lung cancer. This medicine may be used for other purposes; ask your health care provider or pharmacist if you have questions. COMMON BRAND NAME(S): IMFINZI What should I tell my health care provider before I take this medicine? They need to know if you have any of these conditions:  diabetes  immune system problems  infection  inflammatory bowel disease  kidney disease  liver disease  lung or breathing disease  lupus  organ transplant  stomach or intestine problems  thyroid disease  an unusual or allergic reaction to durvalumab, other medicines, foods, dyes, or preservatives  pregnant or trying to get pregnant  breast-feeding How should I use this medicine? This medicine is for infusion into a vein. It is given by a health care professional in a hospital or clinic setting. A special MedGuide will be given to you before each treatment. Be sure to read this information carefully each time. Talk to your pediatrician regarding the use of this medicine in children. Special care may be needed. Overdosage: If you think you have taken too much of this medicine contact a poison control center or emergency room at once. NOTE: This medicine is only for you. Do not share this medicine with others. What if I miss a dose? It is important not to miss your dose. Call your doctor or health care professional if you are unable to keep an appointment. What may interact with this medicine? Interactions have not been studied. This list may not describe all possible interactions. Give your health care provider a list of all the medicines, herbs, non-prescription drugs, or dietary supplements you use. Also tell them if you smoke, drink alcohol, or use illegal drugs. Some items may interact with your medicine. What should I watch for while using this  medicine? This drug may make you feel generally unwell. Continue your course of treatment even though you feel ill unless your doctor tells you to stop. You may need blood work done while you are taking this medicine. Do not become pregnant while taking this medicine or for 3 months after stopping it. Women should inform their doctor if they wish to become pregnant or think they might be pregnant. There is a potential for serious side effects to an unborn child. Talk to your health care professional or pharmacist for more information. Do not breast-feed an infant while taking this medicine or for 3 months after stopping it. What side effects may I notice from receiving this medicine? Side effects that you should report to your doctor or health care professional as soon as possible:  allergic reactions like skin rash, itching or hives, swelling of the face, lips, or tongue  black, tarry stools  bloody or watery diarrhea  breathing problems  change in emotions or moods  change in sex drive  changes in vision  chest pain or chest tightness  chills  confusion  cough  facial flushing  fever  headache  signs and symptoms of high blood sugar such as dizziness; dry mouth; dry skin; fruity breath; nausea; stomach pain; increased hunger or thirst; increased urination  signs and symptoms of liver injury like dark yellow or brown urine; general ill feeling or flu-like symptoms; light-colored stools; loss of appetite; nausea; right upper belly pain; unusually weak or tired; yellowing of the eyes or skin  stomach pain  trouble passing urine or change in  the amount of urine  weight gain or weight loss Side effects that usually do not require medical attention (report these to your doctor or health care professional if they continue or are bothersome):  bone pain  constipation  loss of appetite  muscle pain  nausea  swelling of the ankles, feet, hands  tiredness This list  may not describe all possible side effects. Call your doctor for medical advice about side effects. You may report side effects to FDA at 1-800-FDA-1088. Where should I keep my medicine? This drug is given in a hospital or clinic and will not be stored at home. NOTE: This sheet is a summary. It may not cover all possible information. If you have questions about this medicine, talk to your doctor, pharmacist, or health care provider.  2020 Elsevier/Gold Standard (2016-06-21 19:25:04)

## 2019-09-03 NOTE — Progress Notes (Signed)
Hematology and Oncology Follow Up Visit  Lauren Salazar [id number] 1961/04/12 59 y.o. 09/03/2019 1:18 PM [facility], [name] [employer], [facility]   Principle Diagnosis: 59 year old woman with:  1.  Bladder cancer diagnosed in July 2020.  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   2.  Lung cancer diagnosed in August 2020.  She was found to have T4N2 adenocarcinoma of the right lung.  Prior Therapy:   She is status post robotic assisted laparoscopic cystectomy and pelvic exoneration including hysterectomy, bilateral salpingo-oophorectomy and ileoconduit diversion completed on February 01, 2019.  Radiation therapy with weekly chemotherapy utilizing carboplatin and paclitaxel.  Week 1 of chemotherapy started on May 14, 2019.  Therapy concluded in February 2021.  Current therapy: Durvalumab 10 mg/kg cycle 1 on Sep 03, 2019.  This will be repeated every 14 days.  Interim History: Lauren Salazar is here for a follow-up visit.  Since the last visit, she reports feeling well without any major complaints.  She was diagnosed with bronchitis and was treated with antibiotics and currently recovering at this time.  She denies any chest pain or shortness of breath.  She does report productive cough without hemoptysis.  Her appetite and performance status remained reasonable.     Medications: Updated on review. Current Outpatient Medications  Medication Sig Dispense Refill  . acetaminophen (TYLENOL) 500 MG tablet Take 1,000 mg by mouth every 8 (eight) hours as needed (pain).     Marland Kitchen gabapentin (NEURONTIN) 300 MG capsule Take 1 capsule (300 mg total) by mouth 3 (three) times daily. Take for significant back pain around the clock. 42 capsule 1  . guaiFENesin-dextromethorphan (ROBITUSSIN DM) 100-10 MG/5ML syrup Take 5-10 mLs by mouth every 4 (four) hours as needed for cough. 473 mL 0  . nicotine (NICODERM CQ - DOSED IN MG/24  HOURS) 21 mg/24hr patch Place 21 mg onto the skin daily.    . prochlorperazine (COMPAZINE) 10 MG tablet Take 1 tablet (10 mg total) by mouth every 6 (six) hours as needed for nausea or vomiting. (Patient not taking: Reported on 04/02/2019) 30 tablet 0  . sucralfate (CARAFATE) 1 g tablet Dissolve 1 tablet in 10 mL H20 and swallow 30 min prior to meals and bedtime PRN heartburn. 40 tablet 2   No current facility-administered medications for this visit.     Allergies: No Known Allergies      Physical Exam: Blood pressure 137/85, pulse (!) 111, temperature 99.1 F (37.3 C), temperature source Temporal, resp. rate 18, height 5\' 6"  (1.676 m), weight 136 lb (61.7 kg), SpO2 99 %.    ECOG: 1    General appearance: Comfortable appearing without any discomfort Head: Normocephalic without any trauma Oropharynx: Mucous membranes are moist and pink without any thrush or ulcers. Eyes: Pupils are equal and round reactive to light. Lymph nodes: No cervical, supraclavicular, inguinal or axillary lymphadenopathy.   Heart:regular rate and rhythm.  S1 and S2 without leg edema. Lung: Clear without any rhonchi or wheezes.  No dullness to percussion. Abdomin: Soft, nontender, nondistended with good bowel sounds.  No hepatosplenomegaly. Musculoskeletal: No joint deformity or effusion.  Full range of motion noted. Neurological: No deficits noted on motor, sensory and deep tendon reflex exam. Skin: No petechial rash or dryness.  Appeared moist.          Lab Results: Lab Results  Component Value Date   WBC 8.2 07/22/2019   HGB 13.6 07/22/2019  HCT 41.6 07/22/2019   MCV 97.9 07/22/2019   PLT 318 07/22/2019     Chemistry      Component Value Date/Time   NA 141 07/22/2019 0950   K 4.0 07/22/2019 0950   CL 106 07/22/2019 0950   CO2 25 07/22/2019 0950   BUN 12 07/22/2019 0950   CREATININE 0.90 07/22/2019 0950      Component Value Date/Time   CALCIUM 8.9 07/22/2019 0950   ALKPHOS 93  07/22/2019 0950   AST 12 (L) 07/22/2019 0950   ALT 10 07/22/2019 0950   BILITOT 0.3 07/22/2019 0950         Impression and Plan:   59 year old woman with:    1.  Stage IIIa adenocarcinoma of the right lung after presenting with T4N2 disease in August 2020.  The natural course of her disease was reviewed at this time and treatment options were discussed.  After completing definitive chemotherapy with radiation she is under consideration to start durvalumab.  Complication associated with this treatment and alternative treatment options were reviewed.  Complications clued immune mediated issues as well as nausea, fatigue were reiterated.  Repeat imaging studies will be done in 3 to 6 months.   2.  T4N0 bladder cancer diagnosed in July 2020.  She continues to be on active surveillance at this time after therapy outlined above.  No additional treatment is needed at this time.  We will continue to monitor with periodic imaging.     3.  Antiemetics: Antiemetics will be available to her subsequent cycles.  4.  IV access: Port-A-Cath has been deferred for the time being.  Peripheral veins will continue to be used and Port-A-Cath will be be considered in the future as needed.  5.  Immunotherapy complications: Potential complications including pneumonitis, colitis and thyroid disease were reiterated.  6.  Follow-up: In 2 weeks for the next cycle of therapy.   30  minutes were dedicated to the encounter today.  The time was spent on reviewing her disease status, complications lytic therapy and future plan of care discussion.  Zola Button, MD 5/11/20211:18 PM

## 2019-09-03 NOTE — Progress Notes (Signed)
Ok to treat with HR today per MD [facility]

## 2019-09-04 ENCOUNTER — Telehealth: Payer: Self-pay | Admitting: Oncology

## 2019-09-04 NOTE — Telephone Encounter (Signed)
Scheduled appt per 5/11 los.  Pt number was not working.  Called her daughter number and left a vm of the new appt time of her appt for 5/25.  Appt time was changed per a sch messaage

## 2019-09-11 NOTE — Progress Notes (Signed)
Pharmacist Chemotherapy Monitoring - Follow Up Assessment    I verify that I have reviewed each item in the below checklist:  . Regimen for the patient is scheduled for the appropriate day and plan matches scheduled date. Marland Kitchen Appropriate non-routine labs are ordered dependent on drug ordered. . If applicable, additional medications reviewed and ordered per protocol based on lifetime cumulative doses and/or treatment regimen.   Plan for follow-up and/or issues identified: No . I-vent associated with next due treatment: No . MD and/or nursing notified: No  Lauren Salazar 09/11/2019 9:28 AM

## 2019-09-16 ENCOUNTER — Telehealth: Payer: Self-pay

## 2019-09-16 ENCOUNTER — Other Ambulatory Visit: Payer: Self-pay

## 2019-09-16 ENCOUNTER — Other Ambulatory Visit: Payer: Self-pay | Admitting: Medical

## 2019-09-16 DIAGNOSIS — R059 Cough, unspecified: Secondary | ICD-10-CM

## 2019-09-16 DIAGNOSIS — C3431 Malignant neoplasm of lower lobe, right bronchus or lung: Secondary | ICD-10-CM

## 2019-09-16 NOTE — Telephone Encounter (Signed)
TC from Pt stating she thinks she has pneumonia. Pt stated she has no sob just pain when she breaths in. Pt stated  that she was seen by her PCP was given antibiotics for Bronchitis she states that after taking her treatment. The her condition has gotten worse. She stated she called her PCP as well. Informed Pt. I would return her call. Returned call to Pt. Left a message. Pt called left a message with Radiation Oncology. Returned her call Informed her that she would be able to  See Sandi Mealy PA tomorrow at 10 am Informed Pt if she has unbearable pain to go to the emergency room. Pt verbalized understanding.

## 2019-09-17 ENCOUNTER — Ambulatory Visit (HOSPITAL_COMMUNITY)
Admission: RE | Admit: 2019-09-17 | Discharge: 2019-09-17 | Disposition: A | Discharge status: Home / Self Care | Payer: Medicaid Other | Source: Ambulatory Visit | Attending: Medical | Admitting: Medical

## 2019-09-17 ENCOUNTER — Ambulatory Visit: Payer: Medicaid Other

## 2019-09-17 ENCOUNTER — Inpatient Hospital Stay (HOSPITAL_BASED_OUTPATIENT_CLINIC_OR_DEPARTMENT_OTHER): Payer: Medicaid Other | Admitting: Medical

## 2019-09-17 ENCOUNTER — Other Ambulatory Visit: Payer: Self-pay

## 2019-09-17 ENCOUNTER — Inpatient Hospital Stay: Payer: Medicaid Other

## 2019-09-17 ENCOUNTER — Other Ambulatory Visit: Payer: Medicaid Other

## 2019-09-17 VITALS — BP 113/81 | HR 53 | Temp 98.4°F | Resp 18 | Ht 66.0 in | Wt 135.8 lb

## 2019-09-17 DIAGNOSIS — R05 Cough: Secondary | ICD-10-CM | POA: Diagnosis not present

## 2019-09-17 DIAGNOSIS — R0781 Pleurodynia: Secondary | ICD-10-CM | POA: Diagnosis not present

## 2019-09-17 DIAGNOSIS — C3431 Malignant neoplasm of lower lobe, right bronchus or lung: Secondary | ICD-10-CM

## 2019-09-17 DIAGNOSIS — R059 Cough, unspecified: Secondary | ICD-10-CM

## 2019-09-17 DIAGNOSIS — Z5112 Encounter for antineoplastic immunotherapy: Secondary | ICD-10-CM | POA: Diagnosis not present

## 2019-09-17 DIAGNOSIS — C679 Malignant neoplasm of bladder, unspecified: Secondary | ICD-10-CM

## 2019-09-17 LAB — CBC WITH DIFFERENTIAL (CANCER CENTER ONLY)
Abs Immature Granulocytes: 0.03 10*3/uL (ref 0.00–0.07)
Basophils Absolute: 0.1 10*3/uL (ref 0.0–0.1)
Basophils Relative: 1 %
Eosinophils Absolute: 0.1 10*3/uL (ref 0.0–0.5)
Eosinophils Relative: 1 %
HCT: 42.2 % (ref 36.0–46.0)
Hemoglobin: 13.7 g/dL (ref 12.0–15.0)
Immature Granulocytes: 0 %
Lymphocytes Relative: 8 %
Lymphs Abs: 0.6 10*3/uL — ABNORMAL LOW (ref 0.7–4.0)
MCH: 31.4 pg (ref 26.0–34.0)
MCHC: 32.5 g/dL (ref 30.0–36.0)
MCV: 96.8 fL (ref 80.0–100.0)
Monocytes Absolute: 0.6 10*3/uL (ref 0.1–1.0)
Monocytes Relative: 8 %
Neutro Abs: 5.9 10*3/uL (ref 1.7–7.7)
Neutrophils Relative %: 82 %
Platelet Count: 310 10*3/uL (ref 150–400)
RBC: 4.36 MIL/uL (ref 3.87–5.11)
RDW: 12.2 % (ref 11.5–15.5)
WBC Count: 7.3 10*3/uL (ref 4.0–10.5)
nRBC: 0 % (ref 0.0–0.2)

## 2019-09-17 LAB — CMP (CANCER CENTER ONLY)
ALT: 18 U/L (ref 0–44)
AST: 14 U/L — ABNORMAL LOW (ref 15–41)
Albumin: 3.1 g/dL — ABNORMAL LOW (ref 3.5–5.0)
Alkaline Phosphatase: 112 U/L (ref 38–126)
Anion gap: 8 (ref 5–15)
BUN: 12 mg/dL (ref 6–20)
CO2: 26 mmol/L (ref 22–32)
Calcium: 8.9 mg/dL (ref 8.9–10.3)
Chloride: 108 mmol/L (ref 98–111)
Creatinine: 0.82 mg/dL (ref 0.44–1.00)
GFR, Est AFR Am: >60 mL/min (ref >60)
GFR, Estimated: >60 mL/min (ref >60)
Glucose, Bld: 133 mg/dL — ABNORMAL HIGH (ref 70–99)
Potassium: 3.6 mmol/L (ref 3.5–5.1)
Sodium: 142 mmol/L (ref 135–145)
Total Bilirubin: 0.2 mg/dL — ABNORMAL LOW (ref 0.3–1.2)
Total Protein: 6.8 g/dL (ref 6.5–8.1)

## 2019-09-17 MED ORDER — HYDROCOD POLST-CPM POLST ER 10-8 MG/5ML PO SUER: 5.0000 mL | Freq: Two times a day (BID) | ORAL | 0 refills | Status: DC | PRN

## 2019-09-17 MED ORDER — TRAMADOL HCL 50 MG PO TABS: 50.0000 mg | ORAL_TABLET | Freq: Four times a day (QID) | ORAL | 0 refills | Status: DC | PRN

## 2019-09-17 MED ORDER — SODIUM CHLORIDE 0.9 % IV SOLN
Freq: Once | INTRAVENOUS | Status: AC
  Filled 2019-09-17: qty 250

## 2019-09-17 MED ORDER — SODIUM CHLORIDE 0.9 % IV SOLN
10.0000 mg/kg | Freq: Once | INTRAVENOUS | Status: AC
  Administered 2019-09-17: 620 mg via INTRAVENOUS
  Filled 2019-09-17: qty 2.4

## 2019-09-17 MED ORDER — BENZONATATE 200 MG PO CAPS: 200.0000 mg | ORAL_CAPSULE | Freq: Three times a day (TID) | ORAL | 2 refills | Status: DC | PRN

## 2019-09-17 NOTE — Progress Notes (Signed)
These results were reviewed with the patient.

## 2019-09-17 NOTE — Patient Instructions (Signed)
Durvalumab injection What is this medicine? DURVALUMAB (dur VAL ue mab) is a monoclonal antibody. It is used to treat urothelial cancer and lung cancer. This medicine may be used for other purposes; ask your health care provider or pharmacist if you have questions. COMMON BRAND NAME(S): IMFINZI What should I tell my health care provider before I take this medicine? They need to know if you have any of these conditions:  diabetes  immune system problems  infection  inflammatory bowel disease  kidney disease  liver disease  lung or breathing disease  lupus  organ transplant  stomach or intestine problems  thyroid disease  an unusual or allergic reaction to durvalumab, other medicines, foods, dyes, or preservatives  pregnant or trying to get pregnant  breast-feeding How should I use this medicine? This medicine is for infusion into a vein. It is given by a health care professional in a hospital or clinic setting. A special MedGuide will be given to you before each treatment. Be sure to read this information carefully each time. Talk to your pediatrician regarding the use of this medicine in children. Special care may be needed. Overdosage: If you think you have taken too much of this medicine contact a poison control center or emergency room at once. NOTE: This medicine is only for you. Do not share this medicine with others. What if I miss a dose? It is important not to miss your dose. Call your doctor or health care professional if you are unable to keep an appointment. What may interact with this medicine? Interactions have not been studied. This list may not describe all possible interactions. Give your health care provider a list of all the medicines, herbs, non-prescription drugs, or dietary supplements you use. Also tell them if you smoke, drink alcohol, or use illegal drugs. Some items may interact with your medicine. What should I watch for while using this  medicine? This drug may make you feel generally unwell. Continue your course of treatment even though you feel ill unless your doctor tells you to stop. You may need blood work done while you are taking this medicine. Do not become pregnant while taking this medicine or for 3 months after stopping it. Women should inform their doctor if they wish to become pregnant or think they might be pregnant. There is a potential for serious side effects to an unborn child. Talk to your health care professional or pharmacist for more information. Do not breast-feed an infant while taking this medicine or for 3 months after stopping it. What side effects may I notice from receiving this medicine? Side effects that you should report to your doctor or health care professional as soon as possible:  allergic reactions like skin rash, itching or hives, swelling of the face, lips, or tongue  black, tarry stools  bloody or watery diarrhea  breathing problems  change in emotions or moods  change in sex drive  changes in vision  chest pain or chest tightness  chills  confusion  cough  facial flushing  fever  headache  signs and symptoms of high blood sugar such as dizziness; dry mouth; dry skin; fruity breath; nausea; stomach pain; increased hunger or thirst; increased urination  signs and symptoms of liver injury like dark yellow or brown urine; general ill feeling or flu-like symptoms; light-colored stools; loss of appetite; nausea; right upper belly pain; unusually weak or tired; yellowing of the eyes or skin  stomach pain  trouble passing urine or change in  the amount of urine  weight gain or weight loss Side effects that usually do not require medical attention (report these to your doctor or health care professional if they continue or are bothersome):  bone pain  constipation  loss of appetite  muscle pain  nausea  swelling of the ankles, feet, hands  tiredness This list  may not describe all possible side effects. Call your doctor for medical advice about side effects. You may report side effects to FDA at 1-800-FDA-1088. Where should I keep my medicine? This drug is given in a hospital or clinic and will not be stored at home. NOTE: This sheet is a summary. It may not cover all possible information. If you have questions about this medicine, talk to your doctor, pharmacist, or health care provider.  2020 Elsevier/Gold Standard (2016-06-21 19:25:04)

## 2019-09-17 NOTE — Patient Instructions (Signed)

## 2019-09-19 NOTE — Progress Notes (Signed)
Symptoms Management Clinic Progress Note   Lauren Salazar [id number] 08-Jun-1960 59 y.o.  Lauren Salazar is managed by Dr. Zola Button  Actively treated with chemotherapy/immunotherapy/hormonal therapy: yes  Current therapy: Imfinzi  Last treated: 09/17/2019 (cyclye 2, day 1)  Next scheduled appointment with provider: 10/01/2019  Assessment: Plan:    Cough - Plan: benzonatate (TESSALON) 200 MG capsule, chlorpheniramine-HYDROcodone (TUSSIONEX) 10-8 MG/5ML SUER  Rib pain on right side - Plan: traMADol (ULTRAM) 50 MG tablet  Cancer of lower lobe of right lung ([place])  Malignant neoplasm of urinary bladder, unspecified site ([place])   Cough I reviewed the patient's chest x-ray with her.  It did not show any evidence of a pneumonia.  We discussed that her cough could be secondary to the density.  She was given a prescription for Tessalon Perles 200 mg p.o. 3 times daily as needed for cough and Tussionex 10-8 mg / 5 mL suspension, 5 mL p.o. twice daily as needed for a cough.  Right rib pain: Discussed with patient that her right rib pain is likely secondary to costochondritis.  I have given her a prescription for tramadol and have told her to use Motrin in between the for more of an anti-inflammatory effect.  History of right lung cancer and a malignant neoplasm of urinary bladder: The patient continues to be followed by Dr. Alen Blew and is being treated with Imfinzi with cycle 2, day 1 last given on 06/20/2019.  She was see Dr. Alen Blew in follow-up on 10/01/2019.  Please see After Visit Summary for patient specific instructions.  Future Appointments  Date Time Provider [place]  10/01/2019  1:00 PM [facility] LAB 4 [facility] None  10/01/2019  1:30 PM Wyatt Portela, MD [facility] None  10/01/2019  2:15 PM [facility] INFUSION [facility] None  10/15/2019 12:15 PM [facility] ONLY [facility] None  10/15/2019  1:15 PM [facility] INFUSION [facility] None  10/16/2019  11:00 AM [name], MD [facility] None    No orders of the defined types were placed in this encounter.      Subjective:   Patient ID:  Lauren Salazar is a 59 y.o. (DOB 08/29/1960) female.  Chief Complaint:  Chief Complaint  Patient presents with  . Shortness of Breath    HPI Lauren Salazar is a 59 y.o. female with a diagnosis of right lung cancer and a malignant neoplasm of the urinary bladder.  She continues to be followed by Dr. Alen Blew and is currently treated with Imfinzi last being dosed with cycle 2, day 1 on 06/20/2019.  She has developed a mostly nonproductive cough since beginning Imfinzi.  She was concerned that she could have a recurrent pneumonia and since she has right-sided chest pain and reports that her back hurts.  These cause difficulty for sleeping.  She has shortness of breath and dyspnea on exertion.  She denies fevers, chills, or sweats.  She had a chest x-ray completed today which returned showing:  FINDINGS: Opacity lateral to the right hilum is likely indicative of the masslike area seen in the right lower lobe on prior CT.  This area measures 2.9 x 2.5 cm on frontal view. It is not well seen on lateral view.  There is scarring in the right base.  Lungs elsewhere clear.  Heart size and pulmonary vascularity are normal.  No adenopathy.  No pneumothorax.  Postoperative change noted in the lower cervical spine.  IMPRESSION: Suspect persistent mass right lower lobe, seen only on the frontal view  currently.  This lesion is ill-defined with questionable degree of surrounding pneumonitis.  Lungs elsewhere clear except for scarring right base.  Cardiac silhouette normal.  No adenopathy appreciable.  Comment: Given lack of visualization on lateral view, it may be prudent to correlate with chest CT at this time to more directly assess for stability since previous study.    Medications: I have reviewed the patient's current  medications.  Allergies: No Known Allergies  Past Medical History:  Diagnosis Date  . Bladder tumor   . Complication of anesthesia    slow to wake after spinal fusion, then was "looney" for the following 24 hours   . Persistent dry cough    ongoing since feb 2020    Past Surgical History:  Procedure Laterality Date  . CERVICAL FUSION  2005   c5-c6     . CYSTOSCOPY WITH INJECTION N/A 02/01/2019   Procedure: CYSTOSCOPY WITH INJECTION;  Surgeon: Alexis Frock, MD;  Location: [facility];  Service: Urology;  Laterality: N/A;  . LYMPHADENECTOMY Bilateral 02/01/2019   Procedure: LYMPHADENECTOMY;  Surgeon: Alexis Frock, MD;  Location: [facility];  Service: Urology;  Laterality: Bilateral;  . ROBOT ASSISTED LAPAROSCOPIC COMPLETE CYSTECT ILEAL CONDUIT N/A 02/01/2019   Procedure: XI ROBOTIC ASSISTED LAPAROSCOPIC COMPLETE CYSTECTOMY, ILEAL CONDUIT, HYSTERECTOMY WITH BILATERAL SALPINGOOPHORECTOMY;  Surgeon: Alexis Frock, MD;  Location: [facility];  Service: Urology;  Laterality: N/A;  6 HRS  . TRANSURETHRAL RESECTION OF BLADDER TUMOR N/A 10/31/2018   Procedure: TRANSURETHRAL RESECTION OF BLADDER TUMOR (TURBT) WITH CYSTOSCOPY/ POSSIBLE INTRAVESICAL GEMCITABINE;  Surgeon: Ceasar Mons, MD;  Location: [facility];  Service: Urology;  Laterality: N/A;    No family history on file.  Social History   Socioeconomic History  . Marital status: Divorced    Spouse name: Not on file  . Number of children: Not on file  . Years of education: Not on file  . Highest education level: Not on file  Occupational History  . Not on file  Tobacco Use  . Smoking status: Current Every Day Smoker    Packs/day: 1.00    Types: Cigarettes  . Smokeless tobacco: Never Used  . Tobacco comment: smoking since age 46 , she is cutting back, using patches, trying to stop. 04/02/19  Substance and Sexual Activity  . Alcohol use: No  . Drug use: No  . Sexual activity: Not on file  Other Topics Concern  . Not on file   Social History Narrative  . Not on file   Social Determinants of Health   Financial Resource Strain:   . Difficulty of Paying Living Expenses:   Food Insecurity:   . Worried About Charity fundraiser in the Last Year:   . Arboriculturist in the Last Year:   Transportation Needs: No Transportation Needs  . Lack of Transportation (Medical): No  . Lack of Transportation (Non-Medical): No  Physical Activity:   . Days of Exercise per Week:   . Minutes of Exercise per Session:   Stress:   . Feeling of Stress :   Social Connections:   . Frequency of Communication with Friends and Family:   . Frequency of Social Gatherings with Friends and Family:   . Attends Religious Services:   . Active Member of Clubs or Organizations:   . Attends Archivist Meetings:   Marland Kitchen Marital Status:   Intimate Partner Violence: Not At Risk  . Fear of Current or Ex-Partner: No  . Emotionally Abused: No  .  Physically Abused: No  . Sexually Abused: No    Past Medical History, Surgical history, Social history, and Family history were reviewed and updated as appropriate.   Please see review of systems for further details on the patient's review from today.   Review of Systems:  Review of Systems  Constitutional: Negative for chills, diaphoresis and fever.  HENT: Negative for trouble swallowing.   Respiratory: Positive for cough and shortness of breath. Negative for choking, chest tightness, wheezing and stridor.   Cardiovascular: Positive for chest pain. Negative for palpitations.    Objective:   Physical Exam:  BP 113/81 (BP Location: Left Arm, Patient Position: Sitting)   Pulse (!) 53   Temp 98.4 F (36.9 C) (Temporal)   Resp 18   Ht 5\' 6"  (1.676 m)   Wt 61.6 kg (135 lb 12.8 oz)   SpO2 99%   BMI 21.92 kg/m  ECOG: 0  Physical Exam Constitutional:      General: She is not in acute distress.    Appearance: She is not diaphoretic.  HENT:     Head: Normocephalic and atraumatic.   Cardiovascular:     Rate and Rhythm: Regular rhythm. Bradycardia present.     Heart sounds: Normal heart sounds. No murmur. No friction rub. No gallop.   Pulmonary:     Effort: Pulmonary effort is normal. No respiratory distress.     Breath sounds: Normal breath sounds. No wheezing or rales.  Skin:    General: Skin is warm and dry.     Findings: No erythema or rash.  Neurological:     Mental Status: She is alert.     Motor: No weakness.  Psychiatric:        Mood and Affect: Mood normal.        Behavior: Behavior normal.     Lab Review:     Component Value Date/Time   NA 142 09/17/2019 1036   K 3.6 09/17/2019 1036   CL 108 09/17/2019 1036   CO2 26 09/17/2019 1036   GLUCOSE 133 (H) 09/17/2019 1036   BUN 12 09/17/2019 1036   CREATININE 0.82 09/17/2019 1036   CALCIUM 8.9 09/17/2019 1036   PROT 6.8 09/17/2019 1036   ALBUMIN 3.1 (L) 09/17/2019 1036   AST 14 (L) 09/17/2019 1036   ALT 18 09/17/2019 1036   ALKPHOS 112 09/17/2019 1036   BILITOT 0.2 (L) 09/17/2019 1036   GFRNONAA >60 09/17/2019 1036   GFRAA >60 09/17/2019 1036       Component Value Date/Time   WBC 7.3 09/17/2019 1036   WBC 9.4 01/31/2019 1654   RBC 4.36 09/17/2019 1036   HGB 13.7 09/17/2019 1036   HCT 42.2 09/17/2019 1036   PLT 310 09/17/2019 1036   MCV 96.8 09/17/2019 1036   MCH 31.4 09/17/2019 1036   MCHC 32.5 09/17/2019 1036   RDW 12.2 09/17/2019 1036   LYMPHSABS 0.6 (L) 09/17/2019 1036   MONOABS 0.6 09/17/2019 1036   EOSABS 0.1 09/17/2019 1036   BASOSABS 0.1 09/17/2019 1036   -------------------------------  Imaging from last 24 hours (if applicable):  Radiology interpretation: DG Chest 2 View  Result Date: 09/17/2019 CLINICAL DATA:  Cough and chest pain EXAM: CHEST - 2 VIEW COMPARISON:  Chest radiograph December 10, 2018; chest CT July 22, 2019 FINDINGS: Opacity lateral to the right hilum is likely indicative of the masslike area seen in the right lower lobe on prior CT. This area measures  2.9 x 2.5 cm on frontal view. It is  not well seen on lateral view. There is scarring in the right base. Lungs elsewhere clear. Heart size and pulmonary vascularity are normal. No adenopathy. No pneumothorax. Postoperative change noted in the lower cervical spine. IMPRESSION: Suspect persistent mass right lower lobe, seen only on the frontal view currently. This lesion is ill-defined with questionable degree of surrounding pneumonitis. Lungs elsewhere clear except for scarring right base. Cardiac silhouette normal. No adenopathy appreciable. Comment: Given lack of visualization on lateral view, it may be prudent to correlate with chest CT at this time to more directly assess for stability since previous study. Electronically Signed   By: Lowella Grip III M.D.   On: 09/17/2019 11:26        This case was discussed Dr. Alen Blew. He expressed agreement with my management of this patient.

## 2019-10-01 ENCOUNTER — Inpatient Hospital Stay: Payer: Medicaid Other

## 2019-10-01 ENCOUNTER — Inpatient Hospital Stay: Payer: Medicaid Other | Attending: Oncology

## 2019-10-01 ENCOUNTER — Other Ambulatory Visit: Payer: Self-pay

## 2019-10-01 ENCOUNTER — Inpatient Hospital Stay (HOSPITAL_BASED_OUTPATIENT_CLINIC_OR_DEPARTMENT_OTHER): Payer: Medicaid Other | Admitting: Oncology

## 2019-10-01 DIAGNOSIS — R5383 Other fatigue: Secondary | ICD-10-CM | POA: Insufficient documentation

## 2019-10-01 DIAGNOSIS — R05 Cough: Secondary | ICD-10-CM | POA: Diagnosis not present

## 2019-10-01 DIAGNOSIS — C3490 Malignant neoplasm of unspecified part of unspecified bronchus or lung: Secondary | ICD-10-CM

## 2019-10-01 DIAGNOSIS — C679 Malignant neoplasm of bladder, unspecified: Secondary | ICD-10-CM | POA: Diagnosis not present

## 2019-10-01 DIAGNOSIS — Z5112 Encounter for antineoplastic immunotherapy: Secondary | ICD-10-CM | POA: Diagnosis not present

## 2019-10-01 DIAGNOSIS — Z9221 Personal history of antineoplastic chemotherapy: Secondary | ICD-10-CM | POA: Insufficient documentation

## 2019-10-01 DIAGNOSIS — R059 Cough, unspecified: Secondary | ICD-10-CM

## 2019-10-01 DIAGNOSIS — C3431 Malignant neoplasm of lower lobe, right bronchus or lung: Secondary | ICD-10-CM | POA: Insufficient documentation

## 2019-10-01 LAB — CBC WITH DIFFERENTIAL (CANCER CENTER ONLY)
Abs Immature Granulocytes: 0.03 10*3/uL (ref 0.00–0.07)
Basophils Absolute: 0.1 10*3/uL (ref 0.0–0.1)
Basophils Relative: 1 %
Eosinophils Absolute: 0.1 10*3/uL (ref 0.0–0.5)
Eosinophils Relative: 1 %
HCT: 41.6 % (ref 36.0–46.0)
Hemoglobin: 13.4 g/dL (ref 12.0–15.0)
Immature Granulocytes: 0 %
Lymphocytes Relative: 10 %
Lymphs Abs: 0.8 10*3/uL (ref 0.7–4.0)
MCH: 30.8 pg (ref 26.0–34.0)
MCHC: 32.2 g/dL (ref 30.0–36.0)
MCV: 95.6 fL (ref 80.0–100.0)
Monocytes Absolute: 0.6 10*3/uL (ref 0.1–1.0)
Monocytes Relative: 7 %
Neutro Abs: 7.1 10*3/uL (ref 1.7–7.7)
Neutrophils Relative %: 81 %
Platelet Count: 390 10*3/uL (ref 150–400)
RBC: 4.35 MIL/uL (ref 3.87–5.11)
RDW: 12.4 % (ref 11.5–15.5)
WBC Count: 8.7 10*3/uL (ref 4.0–10.5)
nRBC: 0 % (ref 0.0–0.2)

## 2019-10-01 LAB — CMP (CANCER CENTER ONLY)
ALT: 8 U/L (ref 0–44)
AST: 10 U/L — ABNORMAL LOW (ref 15–41)
Albumin: 3.3 g/dL — ABNORMAL LOW (ref 3.5–5.0)
Alkaline Phosphatase: 110 U/L (ref 38–126)
Anion gap: 11 (ref 5–15)
BUN: 18 mg/dL (ref 6–20)
CO2: 23 mmol/L (ref 22–32)
Calcium: 9.2 mg/dL (ref 8.9–10.3)
Chloride: 107 mmol/L (ref 98–111)
Creatinine: 1.01 mg/dL — ABNORMAL HIGH (ref 0.44–1.00)
GFR, Est AFR Am: >60 mL/min (ref >60)
GFR, Estimated: >60 mL/min (ref >60)
Glucose, Bld: 127 mg/dL — ABNORMAL HIGH (ref 70–99)
Potassium: 3.8 mmol/L (ref 3.5–5.1)
Sodium: 141 mmol/L (ref 135–145)
Total Bilirubin: 0.2 mg/dL — ABNORMAL LOW (ref 0.3–1.2)
Total Protein: 7.1 g/dL (ref 6.5–8.1)

## 2019-10-01 LAB — TSH: TSH: 1.945 u[IU]/mL (ref 0.308–3.960)

## 2019-10-01 MED ORDER — BENZONATATE 200 MG PO CAPS: 200.0000 mg | ORAL_CAPSULE | Freq: Three times a day (TID) | ORAL | 2 refills | Status: DC | PRN

## 2019-10-01 MED ORDER — SODIUM CHLORIDE 0.9 % IV SOLN
Freq: Once | INTRAVENOUS | Status: AC
  Filled 2019-10-01: qty 250

## 2019-10-01 MED ORDER — SODIUM CHLORIDE 0.9 % IV SOLN
10.0000 mg/kg | Freq: Once | INTRAVENOUS | Status: AC
  Administered 2019-10-01: 620 mg via INTRAVENOUS
  Filled 2019-10-01: qty 10

## 2019-10-01 NOTE — Patient Instructions (Signed)
[facility] Discharge Instructions for Patients Receiving Chemotherapy  Today you received the following chemotherapy agents: durvalumab.  To help prevent nausea and vomiting after your treatment, we encourage you to take your nausea medication as directed.   If you develop nausea and vomiting that is not controlled by your nausea medication, call the clinic.   BELOW ARE SYMPTOMS THAT SHOULD BE REPORTED IMMEDIATELY:  *FEVER GREATER THAN 100.5 F  *CHILLS WITH OR WITHOUT FEVER  NAUSEA AND VOMITING THAT IS NOT CONTROLLED WITH YOUR NAUSEA MEDICATION  *UNUSUAL SHORTNESS OF BREATH  *UNUSUAL BRUISING OR BLEEDING  TENDERNESS IN MOUTH AND THROAT WITH OR WITHOUT PRESENCE OF ULCERS  *URINARY PROBLEMS  *BOWEL PROBLEMS  UNUSUAL RASH Items with * indicate a potential emergency and should be followed up as soon as possible.  Feel free to call the clinic should you have any questions or concerns. The clinic phone number is [phone].  Please show the CHEMO ALERT CARD at check-in to the Emergency Department and triage nurse.   

## 2019-10-01 NOTE — Progress Notes (Signed)
Hematology and Oncology Follow Up Visit  Lauren Salazar [id number] 11-08-1960 59 y.o. 10/01/2019 1:14 PM College, [name] @ [employer], [place] [facility]   Principle Diagnosis: 59 year old woman with:  1.  T4N0 bladder cancer diagnosed in July 2020.  The pathology at that time indicated squamous cell carcinoma.  2.  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    Prior Therapy:   She is status post robotic assisted laparoscopic cystectomy and pelvic exoneration including hysterectomy, bilateral salpingo-oophorectomy and ileoconduit diversion completed on February 01, 2019.  Radiation therapy with weekly chemotherapy utilizing carboplatin and paclitaxel.  Week 1 of chemotherapy started on May 14, 2019.  Therapy concluded in February 2021.  Current therapy: Durvalumab 10 mg/kg cycle 1 on Sep 03, 2019 every 14 days.  She is here for cycle 3 of therapy.  Interim History: Ms. Basic returns today for a repeat evaluation.  Since the last visit, she developed chronic cough and chest pain related to the cough.  She received cough medicine including Tessalon Perles and Hycodan as well as tramadol which helped her symptoms.  Her cough is much improved at this time although she does report chronic nighttime nonproductive cough.  Her chest pain has completely resolved at this time.  She has tolerated therapy without any new complaints.  She denies any shortness of breath, weight loss or constitutional symptoms.  Her performance status quality of life remains unchanged.     Medications: Reviewed without changes. Current Outpatient Medications  Medication Sig Dispense Refill  . acetaminophen (TYLENOL) 500 MG tablet Take 1,000 mg by mouth every 8 (eight) hours as needed (pain).     . benzonatate (TESSALON) 200 MG capsule Take 1 capsule (200 mg total) by mouth 3 (three) times daily as needed for cough. 30 capsule 2  . chlorpheniramine-HYDROcodone (TUSSIONEX) 10-8  MG/5ML SUER Take 5 mLs by mouth every 12 (twelve) hours as needed for cough. 140 mL 0  . gabapentin (NEURONTIN) 300 MG capsule Take 1 capsule (300 mg total) by mouth 3 (three) times daily. Take for significant back pain around the clock. 42 capsule 1  . guaiFENesin-dextromethorphan (ROBITUSSIN DM) 100-10 MG/5ML syrup Take 5-10 mLs by mouth every 4 (four) hours as needed for cough. 473 mL 0  . nicotine (NICODERM CQ - DOSED IN MG/24 HOURS) 21 mg/24hr patch Place 21 mg onto the skin daily.    . prochlorperazine (COMPAZINE) 10 MG tablet Take 1 tablet (10 mg total) by mouth every 6 (six) hours as needed for nausea or vomiting. (Patient not taking: Reported on 04/02/2019) 30 tablet 0  . sucralfate (CARAFATE) 1 g tablet Dissolve 1 tablet in 10 mL H20 and swallow 30 min prior to meals and bedtime PRN heartburn. 40 tablet 2  . traMADol (ULTRAM) 50 MG tablet Take 1 tablet (50 mg total) by mouth every 6 (six) hours as needed. 30 tablet 0   No current facility-administered medications for this visit.     Allergies: No Known Allergies      Physical Exam:  Blood pressure 126/89, pulse 100, temperature 97.7 F (36.5 C), temperature source Temporal, resp. rate 20, height 5\' 6"  (1.676 m), weight 135 lb 6.4 oz (61.4 kg), SpO2 98 %.   ECOG: 1    General appearance: Alert, awake without any distress. Head: Atraumatic without abnormalities Oropharynx: Without any thrush or ulcers. Eyes: No scleral icterus. Lymph nodes: No lymphadenopathy noted in the cervical, supraclavicular, or axillary nodes Cardiovascular:regular rate and rhythm, without any murmurs  or gallops.   Respiratory: Clear to auscultation without any rhonchi, wheezes or dullness to percussion. Abdomin: Soft, nontender without any shifting dullness or ascites. Musculoskeletal: No clubbing or cyanosis. Neurological: No motor or sensory deficits. Dermatological: No rashes or lesions.          Lab Results: Lab Results  Component  Value Date   WBC 7.3 09/17/2019   HGB 13.7 09/17/2019   HCT 42.2 09/17/2019   MCV 96.8 09/17/2019   PLT 310 09/17/2019     Chemistry      Component Value Date/Time   NA 142 09/17/2019 1036   K 3.6 09/17/2019 1036   CL 108 09/17/2019 1036   CO2 26 09/17/2019 1036   BUN 12 09/17/2019 1036   CREATININE 0.82 09/17/2019 1036      Component Value Date/Time   CALCIUM 8.9 09/17/2019 1036   ALKPHOS 112 09/17/2019 1036   AST 14 (L) 09/17/2019 1036   ALT 18 09/17/2019 1036   BILITOT 0.2 (L) 09/17/2019 1036         Impression and Plan:   59 year old woman with:    1.  Lung cancer diagnosed in August 2020.  She was found to have stage IIIa adenocarcinoma with T4N2 initial presentation.  She is currently receiving durvalumab after completing chemotherapy and radiation.  Risks and benefits of continuing this treatments were discussed.  Complications that include GI toxicity, dermatological issues as well as immune mediated complications were reviewed.  She is agreeable to proceed at this time.  She prefers to increase the interval between treatments due to fatigue at times.  She will receive treatment on June 22 and 4 weeks after that.   2.  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  He is status post surgical therapy without any recent evidence of relapse of her symptoms.  The natural course of disease and risk of recurrence were discussed and the plan is to repeat imaging studies.     3.  Antiemetics: No nausea or vomiting reported at this time.  Antiemetics available to her.  4.  IV access: Peripheral veins are currently in use without any issues.  Port-A-Cath will be considered if needed in the future.  5.  Immune mediated side effects: I continue to educate about potential complications including colitis, pneumonitis and thyroid disease.  6.  Follow-up: Will be in 2 weeks   30  minutes were spent on this visit.  Time was dedicated to reviewing her  disease status, discussing treatment options, addressing complication related therapy and future plan of care discussion.  Zola Button, MD 6/8/20211:14 PM

## 2019-10-02 ENCOUNTER — Telehealth: Payer: Self-pay | Admitting: Oncology

## 2019-10-02 NOTE — Telephone Encounter (Signed)
Scheduled appt per 6/8 los.

## 2019-10-09 NOTE — Progress Notes (Signed)
Pharmacist Chemotherapy Monitoring - Follow Up Assessment    I verify that I have reviewed each item in the below checklist:  . Regimen for the patient is scheduled for the appropriate day and plan matches scheduled date. Marland Kitchen Appropriate non-routine labs are ordered dependent on drug ordered. . If applicable, additional medications reviewed and ordered per protocol based on lifetime cumulative doses and/or treatment regimen.   Plan for follow-up and/or issues identified: No . I-vent associated with next due treatment: No . MD and/or nursing notified: No   Kennith Center, Pharm.D., CPP 10/09/2019@9 :28 AM

## 2019-10-15 ENCOUNTER — Inpatient Hospital Stay: Payer: Medicaid Other

## 2019-10-15 ENCOUNTER — Other Ambulatory Visit: Payer: Self-pay

## 2019-10-15 ENCOUNTER — Telehealth: Payer: Self-pay | Admitting: Radiation Oncology

## 2019-10-15 VITALS — BP 133/87 | HR 95 | Temp 98.3°F | Resp 18 | Wt 133.0 lb

## 2019-10-15 DIAGNOSIS — C3431 Malignant neoplasm of lower lobe, right bronchus or lung: Secondary | ICD-10-CM

## 2019-10-15 DIAGNOSIS — C3490 Malignant neoplasm of unspecified part of unspecified bronchus or lung: Secondary | ICD-10-CM

## 2019-10-15 DIAGNOSIS — Z5112 Encounter for antineoplastic immunotherapy: Secondary | ICD-10-CM | POA: Diagnosis not present

## 2019-10-15 LAB — CBC WITH DIFFERENTIAL (CANCER CENTER ONLY)
Abs Immature Granulocytes: 0.03 10*3/uL (ref 0.00–0.07)
Basophils Absolute: 0.1 10*3/uL (ref 0.0–0.1)
Basophils Relative: 1 %
Eosinophils Absolute: 0.1 10*3/uL (ref 0.0–0.5)
Eosinophils Relative: 2 %
HCT: 42.5 % (ref 36.0–46.0)
Hemoglobin: 13.9 g/dL (ref 12.0–15.0)
Immature Granulocytes: 0 %
Lymphocytes Relative: 11 %
Lymphs Abs: 0.8 10*3/uL (ref 0.7–4.0)
MCH: 30 pg (ref 26.0–34.0)
MCHC: 32.7 g/dL (ref 30.0–36.0)
MCV: 91.8 fL (ref 80.0–100.0)
Monocytes Absolute: 0.7 10*3/uL (ref 0.1–1.0)
Monocytes Relative: 9 %
Neutro Abs: 6 10*3/uL (ref 1.7–7.7)
Neutrophils Relative %: 77 %
Platelet Count: 293 10*3/uL (ref 150–400)
RBC: 4.63 MIL/uL (ref 3.87–5.11)
RDW: 12.5 % (ref 11.5–15.5)
WBC Count: 7.7 10*3/uL (ref 4.0–10.5)
nRBC: 0 % (ref 0.0–0.2)

## 2019-10-15 LAB — CMP (CANCER CENTER ONLY)
ALT: 12 U/L (ref 0–44)
AST: 11 U/L — ABNORMAL LOW (ref 15–41)
Albumin: 3.3 g/dL — ABNORMAL LOW (ref 3.5–5.0)
Alkaline Phosphatase: 110 U/L (ref 38–126)
Anion gap: 10 (ref 5–15)
BUN: 13 mg/dL (ref 6–20)
CO2: 22 mmol/L (ref 22–32)
Calcium: 9 mg/dL (ref 8.9–10.3)
Chloride: 107 mmol/L (ref 98–111)
Creatinine: 0.95 mg/dL (ref 0.44–1.00)
GFR, Est AFR Am: >60 mL/min (ref >60)
GFR, Estimated: >60 mL/min (ref >60)
Glucose, Bld: 132 mg/dL — ABNORMAL HIGH (ref 70–99)
Potassium: 4 mmol/L (ref 3.5–5.1)
Sodium: 139 mmol/L (ref 135–145)
Total Bilirubin: 0.3 mg/dL (ref 0.3–1.2)
Total Protein: 6.8 g/dL (ref 6.5–8.1)

## 2019-10-15 LAB — TSH: TSH: 1.869 u[IU]/mL (ref 0.308–3.960)

## 2019-10-15 MED ORDER — SODIUM CHLORIDE 0.9 % IV SOLN
Freq: Once | INTRAVENOUS | Status: AC
  Filled 2019-10-15: qty 250

## 2019-10-15 MED ORDER — SODIUM CHLORIDE 0.9 % IV SOLN
10.0000 mg/kg | Freq: Once | INTRAVENOUS | Status: AC
  Administered 2019-10-15: 620 mg via INTRAVENOUS
  Filled 2019-10-15: qty 10

## 2019-10-15 NOTE — Patient Instructions (Signed)
[facility] Discharge Instructions for Patients Receiving Chemotherapy  Today you received the following chemotherapy agents: durvalumab.  To help prevent nausea and vomiting after your treatment, we encourage you to take your nausea medication as directed.   If you develop nausea and vomiting that is not controlled by your nausea medication, call the clinic.   BELOW ARE SYMPTOMS THAT SHOULD BE REPORTED IMMEDIATELY:  *FEVER GREATER THAN 100.5 F  *CHILLS WITH OR WITHOUT FEVER  NAUSEA AND VOMITING THAT IS NOT CONTROLLED WITH YOUR NAUSEA MEDICATION  *UNUSUAL SHORTNESS OF BREATH  *UNUSUAL BRUISING OR BLEEDING  TENDERNESS IN MOUTH AND THROAT WITH OR WITHOUT PRESENCE OF ULCERS  *URINARY PROBLEMS  *BOWEL PROBLEMS  UNUSUAL RASH Items with * indicate a potential emergency and should be followed up as soon as possible.  Feel free to call the clinic should you have any questions or concerns. The clinic phone number is [phone].  Please show the CHEMO ALERT CARD at check-in to the Emergency Department and triage nurse.   

## 2019-10-15 NOTE — Telephone Encounter (Signed)
Pt states that she is fine and does not wish to have a f/u on Wednesday w/Dr. Isidore Moos (6/23). Appt canceled and let Dr./Nurse know.

## 2019-10-16 ENCOUNTER — Ambulatory Visit: Payer: Self-pay | Admitting: Radiation Oncology

## 2019-11-12 ENCOUNTER — Telehealth: Payer: Self-pay

## 2019-11-12 ENCOUNTER — Inpatient Hospital Stay: Payer: Medicaid Other

## 2019-11-12 ENCOUNTER — Inpatient Hospital Stay: Payer: Medicaid Other | Admitting: Oncology

## 2019-11-12 NOTE — Telephone Encounter (Signed)
-----   Message from Wyatt Portela, MD sent at 11/12/2019 11:08 AM EDT ----- Regarding: RE: FYI Agree about rescheduling her tell next week. She can't come next week if she is having symptoms. Thanks ----- Message ----- From: Tami Lin, RN Sent: 11/12/2019  11:02 AM EDT To: Wyatt Portela, MD Subject: FYI                                            Patient called and states she was around her 61 year old grandchild who had a cold and now she has a cold with temp 99.0, cough, congestion, runny nose, and headache x 2 days. She is going to r/s her today's  appts to next week. She was scheduled to see you and for D1 C5 Imfinzi today. I asked her if she has been to her PCP to get checked out and she said no she thinks it's just the common cold but will get seen if she doesn't feel better in a couple of days. She has not received the COVID vaccine.  Lanelle Bal

## 2019-11-12 NOTE — Telephone Encounter (Signed)
Scheduling message sent to r/s patient for next week. Today's appointments canceled.

## 2019-11-13 ENCOUNTER — Telehealth: Payer: Self-pay | Admitting: Oncology

## 2019-11-13 NOTE — Telephone Encounter (Signed)
R/s appt per 7/20 sch msg - unable to reach pt. Left message with appt date and time

## 2019-11-18 ENCOUNTER — Telehealth: Payer: Self-pay

## 2019-11-18 ENCOUNTER — Inpatient Hospital Stay: Payer: Medicaid Other

## 2019-11-18 ENCOUNTER — Telehealth: Payer: Self-pay | Admitting: Oncology

## 2019-11-18 ENCOUNTER — Inpatient Hospital Stay: Payer: Medicaid Other | Admitting: Oncology

## 2019-11-18 NOTE — Telephone Encounter (Signed)
Rescheduled todays appts per sch msg. Called and spoke with patient. Confirmed appt change

## 2019-11-18 NOTE — Telephone Encounter (Signed)
Received call from pt stating she needs to r/s appt she has for today d/t "worsening cold" Pt state she "feels better" but can not beat congestion/cough; denies fever. Per Dr Alen Blew, I advised pt to be seen in ED or urgent care, as her last CXR was 5/25, and since her cough has since worsened she may need it repeated. Informed pt I would send message to scheduling to r/s appt for sometime next week. Pt verbalized thanks and understanding.

## 2019-11-26 ENCOUNTER — Inpatient Hospital Stay: Payer: Medicaid Other

## 2019-11-26 ENCOUNTER — Inpatient Hospital Stay: Payer: Medicaid Other | Attending: Oncology | Admitting: Oncology

## 2019-11-26 ENCOUNTER — Other Ambulatory Visit: Payer: Self-pay

## 2019-11-26 VITALS — BP 116/82 | HR 110 | Temp 97.8°F | Resp 20 | Ht 66.0 in | Wt 134.6 lb

## 2019-11-26 DIAGNOSIS — Z90710 Acquired absence of both cervix and uterus: Secondary | ICD-10-CM | POA: Diagnosis not present

## 2019-11-26 DIAGNOSIS — C679 Malignant neoplasm of bladder, unspecified: Secondary | ICD-10-CM | POA: Diagnosis not present

## 2019-11-26 DIAGNOSIS — Z9221 Personal history of antineoplastic chemotherapy: Secondary | ICD-10-CM | POA: Insufficient documentation

## 2019-11-26 DIAGNOSIS — Z90722 Acquired absence of ovaries, bilateral: Secondary | ICD-10-CM | POA: Insufficient documentation

## 2019-11-26 DIAGNOSIS — C3431 Malignant neoplasm of lower lobe, right bronchus or lung: Secondary | ICD-10-CM | POA: Diagnosis not present

## 2019-11-26 DIAGNOSIS — Z923 Personal history of irradiation: Secondary | ICD-10-CM | POA: Diagnosis not present

## 2019-11-26 DIAGNOSIS — C3490 Malignant neoplasm of unspecified part of unspecified bronchus or lung: Secondary | ICD-10-CM | POA: Diagnosis present

## 2019-11-26 LAB — CMP (CANCER CENTER ONLY)
ALT: 10 U/L (ref 0–44)
AST: 12 U/L — ABNORMAL LOW (ref 15–41)
Albumin: 3.4 g/dL — ABNORMAL LOW (ref 3.5–5.0)
Alkaline Phosphatase: 104 U/L (ref 38–126)
Anion gap: 9 (ref 5–15)
BUN: 14 mg/dL (ref 6–20)
CO2: 25 mmol/L (ref 22–32)
Calcium: 9.6 mg/dL (ref 8.9–10.3)
Chloride: 106 mmol/L (ref 98–111)
Creatinine: 0.98 mg/dL (ref 0.44–1.00)
GFR, Est AFR Am: >60 mL/min (ref >60)
GFR, Estimated: >60 mL/min (ref >60)
Glucose, Bld: 136 mg/dL — ABNORMAL HIGH (ref 70–99)
Potassium: 4.1 mmol/L (ref 3.5–5.1)
Sodium: 140 mmol/L (ref 135–145)
Total Bilirubin: 0.2 mg/dL — ABNORMAL LOW (ref 0.3–1.2)
Total Protein: 7.2 g/dL (ref 6.5–8.1)

## 2019-11-26 LAB — CBC WITH DIFFERENTIAL (CANCER CENTER ONLY)
Abs Immature Granulocytes: 0.04 10*3/uL (ref 0.00–0.07)
Basophils Absolute: 0 10*3/uL (ref 0.0–0.1)
Basophils Relative: 0 %
Eosinophils Absolute: 0.1 10*3/uL (ref 0.0–0.5)
Eosinophils Relative: 1 %
HCT: 42.6 % (ref 36.0–46.0)
Hemoglobin: 14.1 g/dL (ref 12.0–15.0)
Immature Granulocytes: 1 %
Lymphocytes Relative: 13 %
Lymphs Abs: 1 10*3/uL (ref 0.7–4.0)
MCH: 29.9 pg (ref 26.0–34.0)
MCHC: 33.1 g/dL (ref 30.0–36.0)
MCV: 90.3 fL (ref 80.0–100.0)
Monocytes Absolute: 0.6 10*3/uL (ref 0.1–1.0)
Monocytes Relative: 8 %
Neutro Abs: 5.9 10*3/uL (ref 1.7–7.7)
Neutrophils Relative %: 77 %
Platelet Count: 302 10*3/uL (ref 150–400)
RBC: 4.72 MIL/uL (ref 3.87–5.11)
RDW: 13.7 % (ref 11.5–15.5)
WBC Count: 7.6 10*3/uL (ref 4.0–10.5)
nRBC: 0 % (ref 0.0–0.2)

## 2019-11-26 LAB — TSH: TSH: 2.039 u[IU]/mL (ref 0.308–3.960)

## 2019-11-26 NOTE — Progress Notes (Signed)
Hematology and Oncology Follow Up Visit  Lauren Salazar [id number] 04-27-1960 59 y.o. 11/26/2019 1:21 PM College, [facility] @ [employer], [place] [facility]*   Principle Diagnosis: 59 year old woman with:  1.  Bladder cancer diagnosed in July 2020.  She was found to have T4N0 squamous cell carcinoma at that time.  2.  Adenocarcinoma of the lung diagnosed in August 2020.  She was found to have T4N2 disease.  Prior Therapy:   She is status post robotic assisted laparoscopic cystectomy and pelvic exoneration including hysterectomy, bilateral salpingo-oophorectomy and ileoconduit diversion completed on February 01, 2019.  Radiation therapy with weekly chemotherapy utilizing carboplatin and paclitaxel.  Week 1 of chemotherapy started on May 14, 2019.  Therapy concluded in February 2021.  Current therapy: Durvalumab 10 mg/kg cycle 1 on Sep 03, 2019 every 14 days.  She completed 4 cycles of therapy.  Interim History: Lauren Salazar is here for a follow-up visit.  Since the last visit, she reports no major changes in her health.  She continues to have issues with chronic cough and dyspnea on exertion.  She denies any fevers chills or night sweats.  She denies any recent hospitalization or illnesses.  Her performance status and quality of life remains reasonable but overall not improving.  She is working very limited amount of hours a week.  She still able to attend to activities of daily living.     Medications: Updated on review. Current Outpatient Medications  Medication Sig Dispense Refill  . acetaminophen (TYLENOL) 500 MG tablet Take 1,000 mg by mouth every 8 (eight) hours as needed (pain).     . benzonatate (TESSALON) 200 MG capsule Take 1 capsule (200 mg total) by mouth 3 (three) times daily as needed for cough. 30 capsule 2  . chlorpheniramine-HYDROcodone (TUSSIONEX) 10-8 MG/5ML SUER Take 5 mLs by mouth every 12 (twelve) hours as needed for cough. 140 mL 0  .  gabapentin (NEURONTIN) 300 MG capsule Take 1 capsule (300 mg total) by mouth 3 (three) times daily. Take for significant back pain around the clock. 42 capsule 1  . guaiFENesin-dextromethorphan (ROBITUSSIN DM) 100-10 MG/5ML syrup Take 5-10 mLs by mouth every 4 (four) hours as needed for cough. 473 mL 0  . nicotine (NICODERM CQ - DOSED IN MG/24 HOURS) 21 mg/24hr patch Place 21 mg onto the skin daily.    . prochlorperazine (COMPAZINE) 10 MG tablet Take 1 tablet (10 mg total) by mouth every 6 (six) hours as needed for nausea or vomiting. (Patient not taking: Reported on 04/02/2019) 30 tablet 0  . sucralfate (CARAFATE) 1 g tablet Dissolve 1 tablet in 10 mL H20 and swallow 30 min prior to meals and bedtime PRN heartburn. 40 tablet 2  . traMADol (ULTRAM) 50 MG tablet Take 1 tablet (50 mg total) by mouth every 6 (six) hours as needed. 30 tablet 0   No current facility-administered medications for this visit.     Allergies: No Known Allergies      Physical Exam:  Blood pressure 116/82, pulse (!) 110, temperature 97.8 F (36.6 C), temperature source Temporal, resp. rate 20, height 5\' 6"  (1.676 m), weight 134 lb 9.6 oz (61.1 kg), SpO2 99 %.    ECOG: 1    General appearance: Comfortable appearing without any discomfort Head: Normocephalic without any trauma Oropharynx: Mucous membranes are moist and pink without any thrush or ulcers. Eyes: Pupils are equal and round reactive to light. Lymph nodes: No cervical, supraclavicular, inguinal or axillary lymphadenopathy.   Heart:regular  rate and rhythm.  S1 and S2 without leg edema. Lung:  Expiratory wheezes noted bilaterally. Abdomin: Soft, nontender, nondistended with good bowel sounds.  No hepatosplenomegaly. Musculoskeletal: No joint deformity or effusion.  Full range of motion noted. Neurological: No deficits noted on motor, sensory and deep tendon reflex exam. Skin: No petechial rash or dryness.  Appeared moist.            Lab  Results: Lab Results  Component Value Date   WBC 7.7 10/15/2019   HGB 13.9 10/15/2019   HCT 42.5 10/15/2019   MCV 91.8 10/15/2019   PLT 293 10/15/2019     Chemistry      Component Value Date/Time   NA 139 10/15/2019 1220   K 4.0 10/15/2019 1220   CL 107 10/15/2019 1220   CO2 22 10/15/2019 1220   BUN 13 10/15/2019 1220   CREATININE 0.95 10/15/2019 1220      Component Value Date/Time   CALCIUM 9.0 10/15/2019 1220   ALKPHOS 110 10/15/2019 1220   AST 11 (L) 10/15/2019 1220   ALT 12 10/15/2019 1220   BILITOT 0.3 10/15/2019 1220         Impression and Plan:   59 year old woman with:    1.  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  She is status post therapy outlined above followed by 4 cycles of immunotherapy for maintenance purposes.  The natural course of her disease was reviewed and treatment options moving forward were discussed.  Before proceeding with any additional immunotherapy at this time we have opted to update her staging work-up.  She is no longer interested in any additional treatment unless therapy would be curative.  Risks and benefits of continuing immunotherapy were reviewed today and she declined it at this time.  She understands that if she develops progression of disease any treatment would likely be palliative at this time rather than curative.  She would like to know that and she might not be interested in any additional treatment regardless.  She is valuing more quality of life moving forward.  The plan is to update her staging work-up in the near future and she does have progressive metastatic disease will be in favor obtaining tissue biopsy for further molecular testing as an alternative if she is interested for a palliative treatment.  Supportive care only would be a possibility if she opted against any anticancer treatment.   2.  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  No urinary symptoms reported at  this time.  We will update her staging work-up in the near future.  3.  Goals of care and prognosis: This was discussed today in detail and she understands if she develops progressive lung cancer that treatment is palliative moving forward.  4.  Immune mediated side effects: She is not experiencing any complications at this time.  Continue to educate her about pneumonitis, colitis and thyroid disease.  5.  Cough: Unlikely to be related to immunotherapy.  This is likely related to her malignancy and possible tumor progression.  Tessalon Perles will be available to her.  6.  Follow-up: Will be in the near future following her CT scan.   30  minutes were dedicated to this encounter.  Time was spent on reviewing her disease status, treatment options and answering questions regarding future plan of care.  Zola Button, MD 8/3/20211:21 PM

## 2019-12-02 ENCOUNTER — Other Ambulatory Visit: Payer: Self-pay | Admitting: Oncology

## 2019-12-02 DIAGNOSIS — R059 Cough, unspecified: Secondary | ICD-10-CM

## 2019-12-03 ENCOUNTER — Telehealth: Payer: Self-pay | Admitting: Oncology

## 2019-12-03 NOTE — Telephone Encounter (Signed)
Scheduled per 08/03 los, patient has been called and notified. Rescheduled 08/20 to 08/24 per patient's request.

## 2019-12-12 ENCOUNTER — Ambulatory Visit (HOSPITAL_COMMUNITY)
Admission: RE | Admit: 2019-12-12 | Discharge: 2019-12-12 | Disposition: A | Discharge status: Home / Self Care | Payer: Medicaid Other | Source: Ambulatory Visit | Attending: Oncology | Admitting: Oncology

## 2019-12-12 ENCOUNTER — Encounter (HOSPITAL_COMMUNITY): Payer: Self-pay

## 2019-12-12 ENCOUNTER — Other Ambulatory Visit: Payer: Self-pay

## 2019-12-12 DIAGNOSIS — C3431 Malignant neoplasm of lower lobe, right bronchus or lung: Secondary | ICD-10-CM | POA: Diagnosis present

## 2019-12-12 MED ORDER — IOHEXOL 300 MG/ML  SOLN
100.0000 mL | Freq: Once | INTRAMUSCULAR | Status: AC | PRN
  Administered 2019-12-12: 100 mL via INTRAVENOUS

## 2019-12-12 MED ORDER — SODIUM CHLORIDE (PF) 0.9 % IJ SOLN
INTRAMUSCULAR | Status: AC
  Filled 2019-12-12: qty 50

## 2019-12-13 ENCOUNTER — Ambulatory Visit: Payer: Medicaid Other | Admitting: Oncology

## 2019-12-16 ENCOUNTER — Telehealth: Payer: Self-pay | Admitting: Oncology

## 2019-12-17 ENCOUNTER — Inpatient Hospital Stay (HOSPITAL_BASED_OUTPATIENT_CLINIC_OR_DEPARTMENT_OTHER): Payer: Medicaid Other | Admitting: Oncology

## 2019-12-17 DIAGNOSIS — C3431 Malignant neoplasm of lower lobe, right bronchus or lung: Secondary | ICD-10-CM | POA: Diagnosis not present

## 2019-12-17 NOTE — Progress Notes (Signed)
Hematology and Oncology Follow Up for Telemedicine Visits  Lauren Salazar [id number] January 13, 1961 59 y.o. 12/17/2019 8:14 AM College, [place] @ [employer], [place] [facility]   I connected with Lauren Salazar on 12/17/19 at  8:30 AM EDT by telephone visit and verified that I am speaking with the correct person using two identifiers.   I discussed the limitations, risks, security and privacy concerns of performing an evaluation and management service by telemedicine and the availability of in-person appointments. I also discussed with the patient that there may be a patient responsible charge related to this service. The patient expressed understanding and agreed to proceed.  Other persons participating in the visit and their role in the encounter:  None  Patient's location:  Home Provider's location:  office    Principle Diagnosis: 59 year old woman with:  1.    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    2.   T4N2 Adenocarcinoma of the lung diagnosed in August 2020.   Prior Therapy:   She is status post robotic assisted laparoscopic cystectomy and pelvic exoneration including hysterectomy, bilateral salpingo-oophorectomy and ileoconduit diversion completed on February 01, 2019.  Radiation therapy with weekly chemotherapy utilizing carboplatin and paclitaxel.  Week 1 of chemotherapy started on May 14, 2019.  Therapy concluded in February 2021.   Durvalumab 10 mg/kg cycle 1 on Sep 03, 2019 every 14 days.  She completed 4 cycles of therapy in June 2021.   Current therapy: Active surveillance.     Interim History: Lauren Salazar reports no major changes since her last visit.  She denies any respiratory complaints cough shortness of breath or difficulty breathing.  She denies any constitutional symptoms or recent hospitalization.    Medications: I have reviewed the patient's current medications.  Current Outpatient Medications   Medication Sig Dispense Refill  . acetaminophen (TYLENOL) 500 MG tablet Take 1,000 mg by mouth every 8 (eight) hours as needed (pain).     . benzonatate (TESSALON) 200 MG capsule TAKE 1 CAPSULE(200 MG) BY MOUTH THREE TIMES DAILY AS NEEDED FOR COUGH 30 capsule 2  . chlorpheniramine-HYDROcodone (TUSSIONEX) 10-8 MG/5ML SUER Take 5 mLs by mouth every 12 (twelve) hours as needed for cough. 140 mL 0  . gabapentin (NEURONTIN) 300 MG capsule Take 1 capsule (300 mg total) by mouth 3 (three) times daily. Take for significant back pain around the clock. 42 capsule 1  . guaiFENesin-dextromethorphan (ROBITUSSIN DM) 100-10 MG/5ML syrup Take 5-10 mLs by mouth every 4 (four) hours as needed for cough. 473 mL 0  . nicotine (NICODERM CQ - DOSED IN MG/24 HOURS) 21 mg/24hr patch Place 21 mg onto the skin daily.    . prochlorperazine (COMPAZINE) 10 MG tablet Take 1 tablet (10 mg total) by mouth every 6 (six) hours as needed for nausea or vomiting. (Patient not taking: Reported on 04/02/2019) 30 tablet 0  . sucralfate (CARAFATE) 1 g tablet Dissolve 1 tablet in 10 mL H20 and swallow 30 min prior to meals and bedtime PRN heartburn. 40 tablet 2  . traMADol (ULTRAM) 50 MG tablet Take 1 tablet (50 mg total) by mouth every 6 (six) hours as needed. 30 tablet 0   No current facility-administered medications for this visit.     Allergies: No Known Allergies      Lab Results: Lab Results  Component Value Date   WBC 7.6 11/26/2019   HGB 14.1 11/26/2019   HCT 42.6 11/26/2019   MCV 90.3 11/26/2019   PLT  302 11/26/2019     Chemistry      Component Value Date/Time   NA 140 11/26/2019 1311   K 4.1 11/26/2019 1311   CL 106 11/26/2019 1311   CO2 25 11/26/2019 1311   BUN 14 11/26/2019 1311   CREATININE 0.98 11/26/2019 1311      Component Value Date/Time   CALCIUM 9.6 11/26/2019 1311   ALKPHOS 104 11/26/2019 1311   AST 12 (L) 11/26/2019 1311   ALT 10 11/26/2019 1311   BILITOT 0.2 (L) 11/26/2019 1311        Radiological Studies: IMPRESSION: Chest Impression:  1. No evidence of lung cancer recurrence. 2. Decrease in thickness and subpleural bands in the RIGHT lower lobe. 3. No lymphadenopathy  Abdomen / Pelvis Impression:  1. No evidence of lung cancer metastasis in the abdomen pelvis. 2. Stable bilateral adrenal adenomas. 3. Post cystectomy and ileal conduit diversion. 4. Potential lesion in lower pole of the RIGHT kidney. Recommend further evaluation with MRI with without contrast versus follow-up on routine surveillance. 5. 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   Impression and Plan:  59 year old woman with:    1.    Lung cancer diagnosed in August 2020.  She was found to have T4N2 adenocarcinoma.  She is currently on active surveillance after completing definitive therapy followed by 4 cycles of Durvalumab.  CT scan obtained on 12/12/2019 for staging purposes did not show any evidence of metastatic disease at this time or disease progression.  Risks and benefits of continuing immunotherapy were reviewed at this time and after discussion we opted to continue with active surveillance alone given her overall poor tolerance and preference overall.  I recommended repeat imaging studies in 6 months for repeat evaluation.  2.    Bladder cancer diagnosed in July 2020.  She was found to have T4N0 squamous cell carcinoma without any evidence of relapsed disease at this time.   CT scan on 12/12/2019 confirmed these findings.   3.  Goals of care and prognosis: Her disease is potentially curable although she develops metastatic progression of disease any treatment will be palliative.  4.  Immune mediated side effects: No clear-cut immune mediated complications.  Therapy will be on hold for the time being.  5.  Cough: Improved at this time with supportive management.  6.  Follow-up: She will return in 3  months for repeat follow-up.     I provided 20 minutes of non face-to-face telephone visit time during this encounter, and > 50% was spent reviewing imaging studies, discussing treatment options and outlining future plan of care.  Zola Button, MD 12/17/2019 8:14 AM

## 2019-12-18 ENCOUNTER — Telehealth: Payer: Self-pay | Admitting: Oncology

## 2019-12-18 NOTE — Telephone Encounter (Signed)
Scheduled appointment per 8/24 los. Left message for patient with appointment date and time.

## 2020-01-17 ENCOUNTER — Other Ambulatory Visit: Payer: Self-pay | Admitting: Medical

## 2020-01-17 DIAGNOSIS — R059 Cough, unspecified: Secondary | ICD-10-CM

## 2020-01-22 ENCOUNTER — Other Ambulatory Visit: Payer: Self-pay | Admitting: Medical

## 2020-01-22 DIAGNOSIS — R059 Cough, unspecified: Secondary | ICD-10-CM

## 2020-01-22 MED ORDER — BENZONATATE 200 MG PO CAPS: ORAL_CAPSULE | ORAL | 2 refills | Status: DC

## 2020-03-17 ENCOUNTER — Inpatient Hospital Stay: Payer: Medicaid Other | Attending: Oncology

## 2020-03-17 ENCOUNTER — Inpatient Hospital Stay (HOSPITAL_BASED_OUTPATIENT_CLINIC_OR_DEPARTMENT_OTHER): Payer: Medicaid Other | Admitting: Oncology

## 2020-03-17 ENCOUNTER — Other Ambulatory Visit: Payer: Self-pay

## 2020-03-17 VITALS — BP 136/92 | HR 109 | Temp 97.6°F | Resp 18 | Ht 66.0 in | Wt 134.2 lb

## 2020-03-17 DIAGNOSIS — Z8551 Personal history of malignant neoplasm of bladder: Secondary | ICD-10-CM | POA: Diagnosis not present

## 2020-03-17 DIAGNOSIS — D3502 Benign neoplasm of left adrenal gland: Secondary | ICD-10-CM | POA: Insufficient documentation

## 2020-03-17 DIAGNOSIS — N2889 Other specified disorders of kidney and ureter: Secondary | ICD-10-CM | POA: Insufficient documentation

## 2020-03-17 DIAGNOSIS — C3490 Malignant neoplasm of unspecified part of unspecified bronchus or lung: Secondary | ICD-10-CM | POA: Diagnosis not present

## 2020-03-17 DIAGNOSIS — K439 Ventral hernia without obstruction or gangrene: Secondary | ICD-10-CM | POA: Insufficient documentation

## 2020-03-17 DIAGNOSIS — Z79899 Other long term (current) drug therapy: Secondary | ICD-10-CM | POA: Diagnosis not present

## 2020-03-17 DIAGNOSIS — C3431 Malignant neoplasm of lower lobe, right bronchus or lung: Secondary | ICD-10-CM | POA: Diagnosis not present

## 2020-03-17 DIAGNOSIS — D3501 Benign neoplasm of right adrenal gland: Secondary | ICD-10-CM | POA: Diagnosis not present

## 2020-03-17 DIAGNOSIS — Z90722 Acquired absence of ovaries, bilateral: Secondary | ICD-10-CM | POA: Insufficient documentation

## 2020-03-17 DIAGNOSIS — Z85118 Personal history of other malignant neoplasm of bronchus and lung: Secondary | ICD-10-CM | POA: Diagnosis not present

## 2020-03-17 DIAGNOSIS — Z923 Personal history of irradiation: Secondary | ICD-10-CM | POA: Insufficient documentation

## 2020-03-17 DIAGNOSIS — Z9221 Personal history of antineoplastic chemotherapy: Secondary | ICD-10-CM | POA: Insufficient documentation

## 2020-03-17 DIAGNOSIS — Z90710 Acquired absence of both cervix and uterus: Secondary | ICD-10-CM | POA: Insufficient documentation

## 2020-03-17 LAB — CBC WITH DIFFERENTIAL (CANCER CENTER ONLY)
Abs Immature Granulocytes: 0.02 10*3/uL (ref 0.00–0.07)
Basophils Absolute: 0.1 10*3/uL (ref 0.0–0.1)
Basophils Relative: 1 %
Eosinophils Absolute: 0.1 10*3/uL (ref 0.0–0.5)
Eosinophils Relative: 2 %
HCT: 45.3 % (ref 36.0–46.0)
Hemoglobin: 14.8 g/dL (ref 12.0–15.0)
Immature Granulocytes: 0 %
Lymphocytes Relative: 15 %
Lymphs Abs: 0.9 10*3/uL (ref 0.7–4.0)
MCH: 30 pg (ref 26.0–34.0)
MCHC: 32.7 g/dL (ref 30.0–36.0)
MCV: 91.9 fL (ref 80.0–100.0)
Monocytes Absolute: 0.6 10*3/uL (ref 0.1–1.0)
Monocytes Relative: 10 %
Neutro Abs: 4.4 10*3/uL (ref 1.7–7.7)
Neutrophils Relative %: 72 %
Platelet Count: 309 10*3/uL (ref 150–400)
RBC: 4.93 MIL/uL (ref 3.87–5.11)
RDW: 13.6 % (ref 11.5–15.5)
WBC Count: 6.1 10*3/uL (ref 4.0–10.5)
nRBC: 0 % (ref 0.0–0.2)

## 2020-03-17 LAB — CMP (CANCER CENTER ONLY)
ALT: 7 U/L (ref 0–44)
AST: 10 U/L — ABNORMAL LOW (ref 15–41)
Albumin: 3.6 g/dL (ref 3.5–5.0)
Alkaline Phosphatase: 103 U/L (ref 38–126)
Anion gap: 11 (ref 5–15)
BUN: 19 mg/dL (ref 6–20)
CO2: 25 mmol/L (ref 22–32)
Calcium: 9.5 mg/dL (ref 8.9–10.3)
Chloride: 103 mmol/L (ref 98–111)
Creatinine: 1.13 mg/dL — ABNORMAL HIGH (ref 0.44–1.00)
GFR, Estimated: 56 mL/min — ABNORMAL LOW (ref >60)
Glucose, Bld: 124 mg/dL — ABNORMAL HIGH (ref 70–99)
Potassium: 4.4 mmol/L (ref 3.5–5.1)
Sodium: 139 mmol/L (ref 135–145)
Total Bilirubin: 0.2 mg/dL — ABNORMAL LOW (ref 0.3–1.2)
Total Protein: 7.5 g/dL (ref 6.5–8.1)

## 2020-03-17 LAB — TSH: TSH: 3.589 u[IU]/mL (ref 0.308–3.960)

## 2020-03-17 NOTE — Progress Notes (Signed)
Hematology and Oncology Follow Up Visit  Lauren Salazar [id number] Oct 21, 1960 59 y.o. 03/17/2020 9:13 AM [facility], [facility] @ [employer], [facility]   Principle Diagnosis: 59 year old woman with T4N2 adenocarcinoma of the lung diagnosed in August 2020.   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   Prior Therapy:   She is status post robotic assisted laparoscopic cystectomy and pelvic exoneration including hysterectomy, bilateral salpingo-oophorectomy and ileoconduit diversion completed on February 01, 2019.  Radiation therapy with weekly chemotherapy utilizing carboplatin and paclitaxel.  Week 1 of chemotherapy started on May 14, 2019.  Therapy concluded in February 2021.   Durvalumab 10 mg/kg cycle 1 on Sep 03, 2019 every 14 days.  She completed 4 cycles of therapy.   Current therapy: Active surveillance   Interim History: Lauren Salazar returns today for a repeat evaluation.  Since her last visit, she reports no major changes in her health.  She denies any respiratory complaints including shortness of breath, dyspnea on exertion or cough.  She denies any hemoptysis or wheezing.  Her performance status and quality of life continues to improve and has been working more since last visit.    Medications: Reviewed without changes. Current Outpatient Medications  Medication Sig Dispense Refill  . acetaminophen (TYLENOL) 500 MG tablet Take 1,000 mg by mouth every 8 (eight) hours as needed (pain).     . benzonatate (TESSALON) 200 MG capsule TAKE 1 CAPSULE(200 MG) BY MOUTH THREE TIMES DAILY AS NEEDED FOR COUGH 30 capsule 2  . chlorpheniramine-HYDROcodone (TUSSIONEX) 10-8 MG/5ML SUER Take 5 mLs by mouth every 12 (twelve) hours as needed for cough. 140 mL 0  . gabapentin (NEURONTIN) 300 MG capsule Take 1 capsule (300 mg total) by mouth 3 (three) times daily. Take for significant  back pain around the clock. 42 capsule 1  . guaiFENesin-dextromethorphan (ROBITUSSIN DM) 100-10 MG/5ML syrup Take 5-10 mLs by mouth every 4 (four) hours as needed for cough. 473 mL 0  . nicotine (NICODERM CQ - DOSED IN MG/24 HOURS) 21 mg/24hr patch Place 21 mg onto the skin daily.    . prochlorperazine (COMPAZINE) 10 MG tablet Take 1 tablet (10 mg total) by mouth every 6 (six) hours as needed for nausea or vomiting. (Patient not taking: Reported on 04/02/2019) 30 tablet 0  . sucralfate (CARAFATE) 1 g tablet Dissolve 1 tablet in 10 mL H20 and swallow 30 min prior to meals and bedtime PRN heartburn. 40 tablet 2  . traMADol (ULTRAM) 50 MG tablet Take 1 tablet (50 mg total) by mouth every 6 (six) hours as needed. 30 tablet 0   No current facility-administered medications for this visit.     Allergies: No Known Allergies      Physical Exam:   Blood pressure (!) 136/92, pulse (!) 109, temperature 97.6 F (36.4 C), temperature source Tympanic, resp. rate 18, height 5\' 6"  (1.676 m), weight 134 lb 3.2 oz (60.9 kg), SpO2 99 %.    ECOG: 1   General appearance: Alert, awake without any distress. Head: Atraumatic without abnormalities Oropharynx: Without any thrush or ulcers. Eyes: No scleral icterus. Lymph nodes: No lymphadenopathy noted in the cervical, supraclavicular, or axillary nodes Heart:regular rate and rhythm, without any murmurs or gallops.   Lung: Clear to auscultation without any rhonchi, wheezes or dullness to percussion. Abdomin: Soft, nontender without any shifting dullness or ascites. Musculoskeletal: No clubbing or cyanosis. Neurological: No motor or sensory  deficits. Skin: No rashes or lesions.            Lab Results: Lab Results  Component Value Date   WBC 7.6 11/26/2019   HGB 14.1 11/26/2019   HCT 42.6 11/26/2019   MCV 90.3 11/26/2019   PLT 302 11/26/2019     Chemistry      Component Value Date/Time   NA 140 11/26/2019 1311   K 4.1 11/26/2019 1311    CL 106 11/26/2019 1311   CO2 25 11/26/2019 1311   BUN 14 11/26/2019 1311   CREATININE 0.98 11/26/2019 1311      Component Value Date/Time   CALCIUM 9.6 11/26/2019 1311   ALKPHOS 104 11/26/2019 1311   AST 12 (L) 11/26/2019 1311   ALT 10 11/26/2019 1311   BILITOT 0.2 (L) 11/26/2019 1311      IMPRESSION: Chest Impression:  1. No evidence of lung cancer recurrence. 2. Decrease in thickness and subpleural bands in the RIGHT lower lobe. 3. No lymphadenopathy  Abdomen / Pelvis Impression:  1. No evidence of lung cancer metastasis in the abdomen pelvis. 2. Stable bilateral adrenal adenomas. 3. Post cystectomy and ileal conduit diversion. 4. Potential lesion in lower pole of the RIGHT kidney. Recommend further evaluation with MRI with without contrast versus follow-up on routine surveillance. 5. 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    Impression and Plan:   59 year old woman with:    1.  Lung cancer diagnosed in August 2020.  She was found to have T4N2 adenocarcinoma.   She is currently on active surveillance after completing definitive therapy followed by maintenance immunotherapy outlined above.  Imaging studies in August 2021 did not show any evidence of relapsed disease.  Risks and benefits of additional treatment at this time versus at the time of relapse were reviewed.  After discussion I recommended repeat imaging studies for the next visit.  She is agreeable to proceed at this time.  2.  Bladder cancer presented with T4N0 squamous cell carcinoma.  She is status post surgical resection without any evidence of relapse based on imaging studies in August 2021.  The natural course of her disease were reviewed and additional anticancer treatment were discussed.  Plan is to repeat imaging studies in 3 months and assess the need for additional treatment at that point.   3.  Goals of care and  prognosis: She has potentially to curable malignancy although she understands she has high risk of relapse on both.  For disease recurrence she understands that she will not have any curative options.  4.  Immune mediated side effects: She has no residual toxicities at this time, immunotherapy.  5.  Cough: Resolved without any exacerbation recently.  6.  Follow-up: In 3 months for repeat follow-up.   30  minutes were spent on this visit.  The time was dedicated to reviewing her disease status, discussing treatment options and future plan of care review.  Zola Button, MD 11/23/20219:13 AM

## 2020-05-11 ENCOUNTER — Telehealth: Payer: Self-pay | Admitting: Oncology

## 2020-05-11 NOTE — Telephone Encounter (Signed)
Rescheduled lab and follow-up appointments per 1/14 schedule message. Patient is aware of changes.

## 2020-06-15 ENCOUNTER — Telehealth: Payer: Self-pay | Admitting: Oncology

## 2020-06-15 NOTE — Telephone Encounter (Signed)
Called patient regarding upcoming appointments, patient is notified. 

## 2020-06-19 ENCOUNTER — Other Ambulatory Visit: Payer: Self-pay | Admitting: Oncology

## 2020-06-19 DIAGNOSIS — C3431 Malignant neoplasm of lower lobe, right bronchus or lung: Secondary | ICD-10-CM

## 2020-06-23 ENCOUNTER — Inpatient Hospital Stay: Payer: Medicaid Other

## 2020-06-23 ENCOUNTER — Ambulatory Visit (HOSPITAL_COMMUNITY): Payer: Medicaid Other

## 2020-06-24 ENCOUNTER — Other Ambulatory Visit: Payer: Medicaid Other

## 2020-06-26 IMAGING — CT CT ABD-PELV W/ CM
3 of 12 series · 9 of 46 positions shown, 15 images · IV contrast (APPLIED)
Comparison: PET-CT 01/16/2019. Chest CT 11/22/2018. Abdomen/pelvis
CT 10/18/2018.

CLINICAL DATA: Non-small-cell lung cancer. History of bladder
cancer status post cystectomy.

EXAM:
CT CHEST WITH CONTRAST
CT ABDOMEN AND PELVIS WITH AND WITHOUT CONTRAST
TECHNIQUE: Multidetector CT imaging of the chest was performed during
intravenous contrast administration. Multidetector CT imaging of the
abdomen and pelvis was performed following the standard protocol
before and during bolus administration of intravenous contrast.
CONTRAST:  100mL OMNIPAQUE IOHEXOL 300 MG/ML  SOLN

[Series 7: axial post · axial · 0.80mm/px · z∈[-627,-337]mm · 5 of 88 slices shown, 10 images]
[im 15/88  soft-tissue]
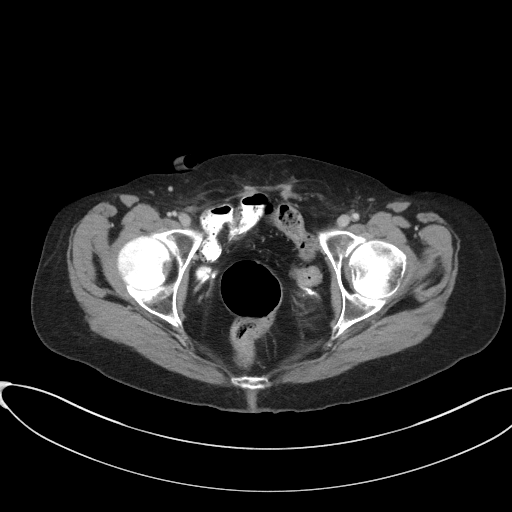
[im 15/88  bone]
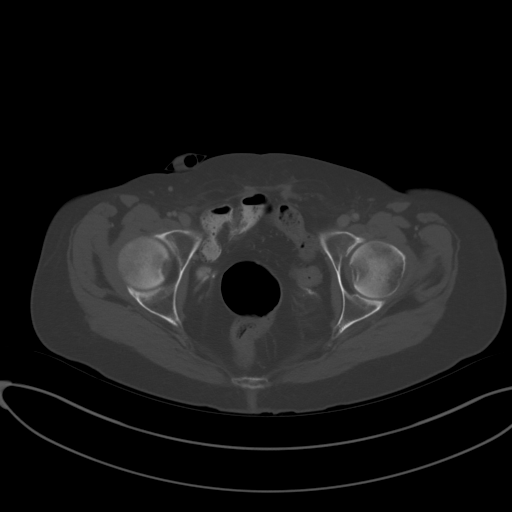
[im 30/88  soft-tissue]
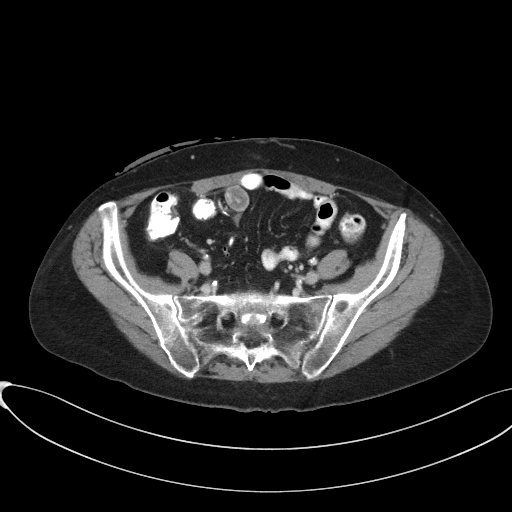
[im 30/88  lung]
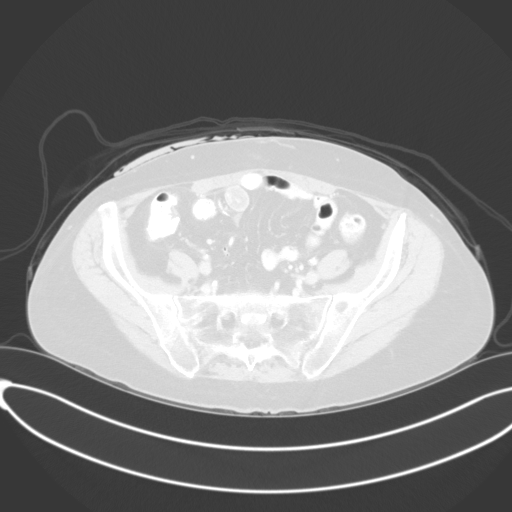
[im 44/88  soft-tissue]
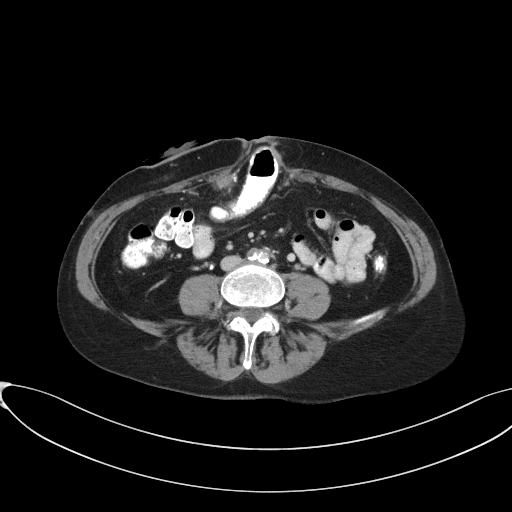
[im 44/88  lung]
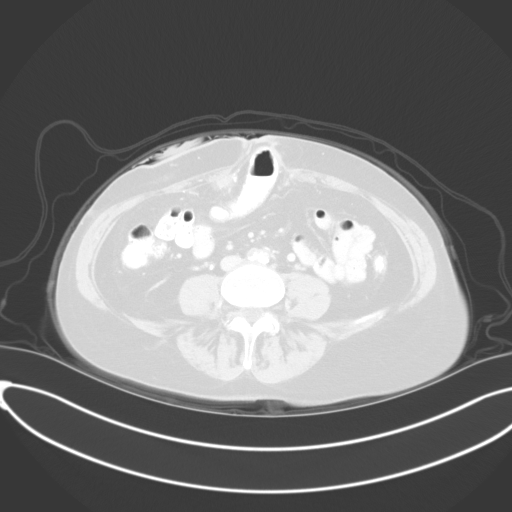
[im 59/88  soft-tissue]
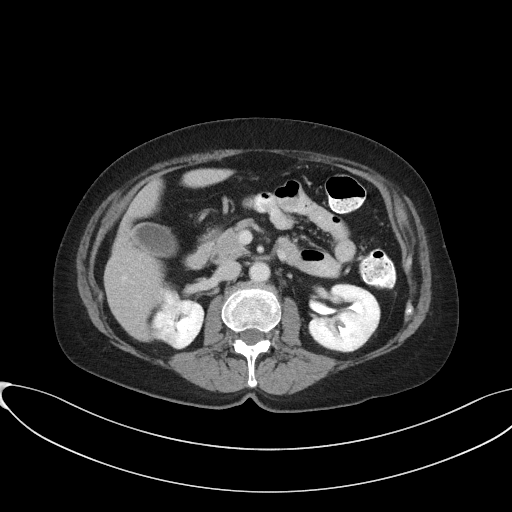
[im 59/88  lung]
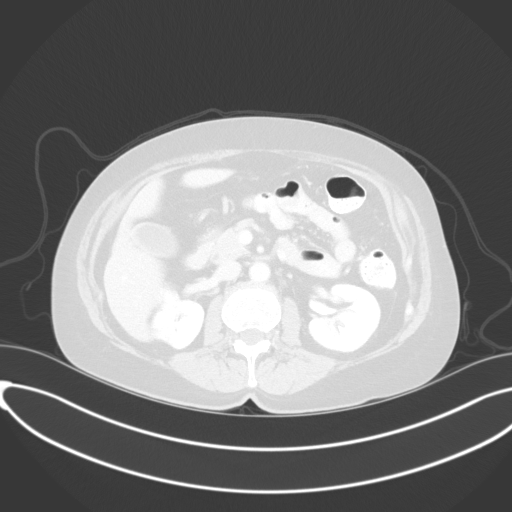
[im 73/88  soft-tissue]
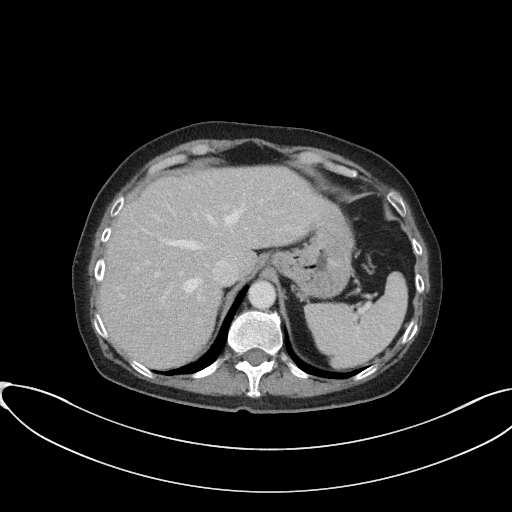
[im 73/88  lung]
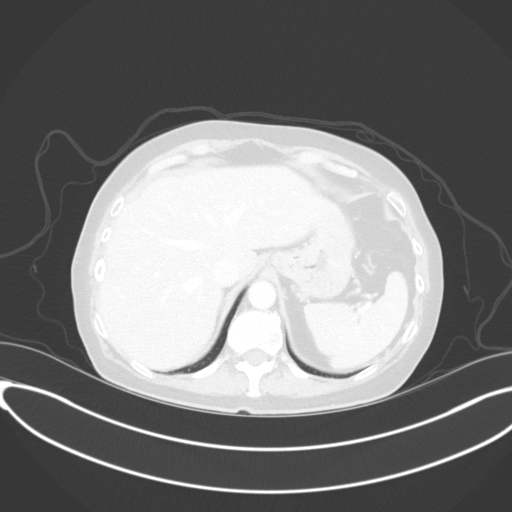

[Series 13: axial delay · axial · delayed · 0.78mm/px · z∈[-426,-351]mm · 2 of 91 slices shown]
[im 16/91  soft-tissue]
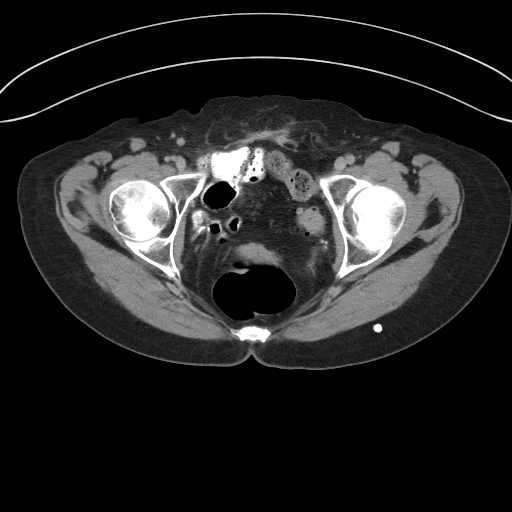
[im 31/91  soft-tissue]
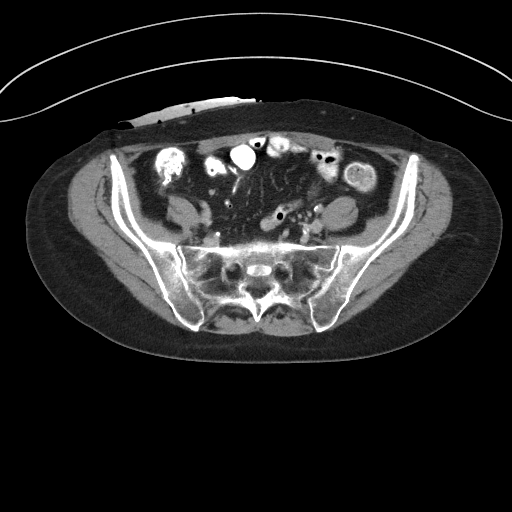

[Series 16: coronal delay · coronal · delayed · 0.64mm/px · 2 of 84 slices shown, 3 images]
[im 28/84  soft-tissue]
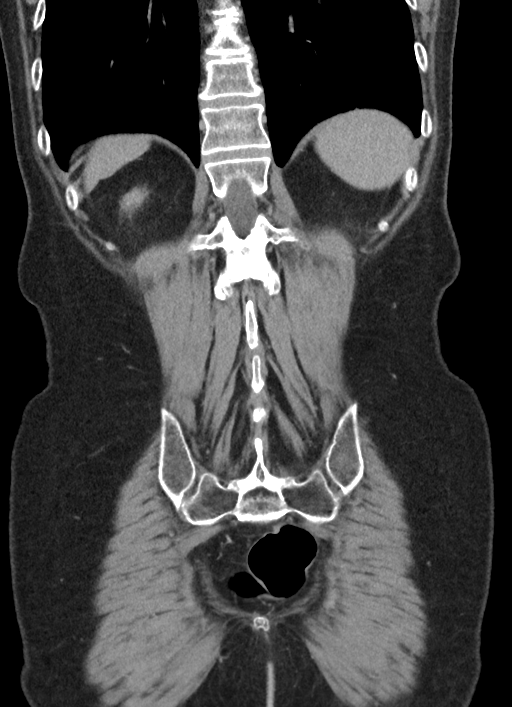
[im 28/84  bone]
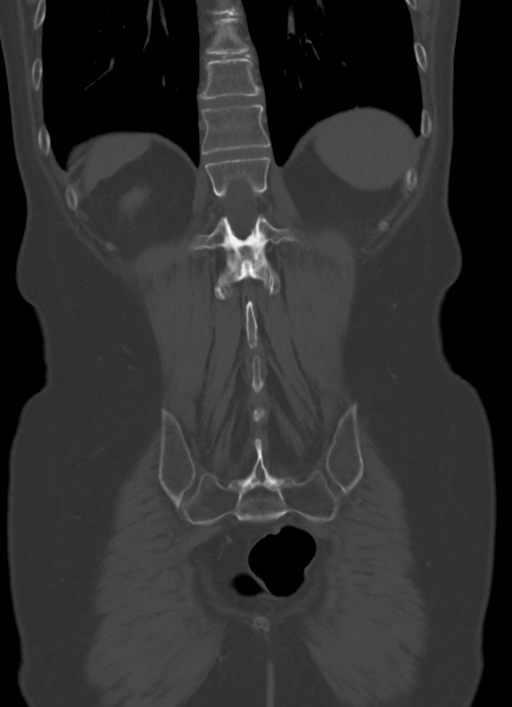
[im 56/84  soft-tissue]
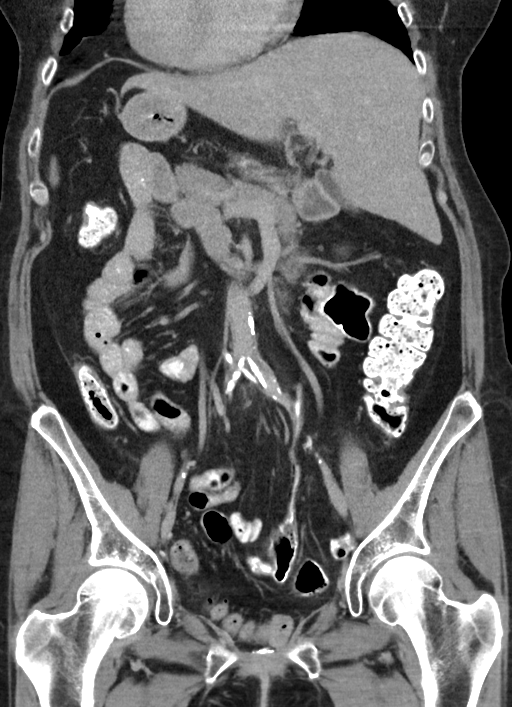

[9 of 46 positions shown; findings below may reference images not displayed]

FINDINGS: CT CHEST FINDINGS

Cardiovascular: The heart size is normal. No substantial pericardial
effusion. Atherosclerotic calcification is noted in the wall of the
thoracic aorta.

Mediastinum/Nodes: No mediastinal lymphadenopathy. Prominent
mediastinal lymph node seen on the PET-CT from 01/16/2019 have
decreased in the interval. No hilar lymphadenopathy. The esophagus
has normal imaging features. There is no axillary lymphadenopathy.

Lungs/Pleura: Centrilobular and paraseptal emphysema evident.

8 mm spiculated nodule in the central right upper lobe (80/5) has
decreased in the interval. This lesion was cavitary and measured 11
mm on the prior 01/16/2019 exam when I remeasure in a similar
fashion on that study.

The lobular posterior right lower lobe mass seen on the previous
study has also decreased measuring 3.0 x 1.1 cm today compared to
4.4 x 2.0 cm when I remeasure it in a similar fashion on the prior
exam.

No new suspicious pulmonary nodule or mass.

Musculoskeletal: No worrisome lytic or sclerotic osseous
abnormality.

CT ABDOMEN AND PELVIS FINDINGS

Hepatobiliary: No suspicious focal abnormality within the liver
parenchyma. Probable minimal adenomyomatosis at the gallbladder
fundus. There is no evidence for gallstones, gallbladder wall
thickening, or pericholecystic fluid. No intrahepatic or
extrahepatic biliary dilation.

Pancreas: No focal mass lesion. No dilatation of the main duct. No
intraparenchymal cyst. No peripancreatic edema.

Spleen: No splenomegaly. No focal mass lesion.

Adrenals/Urinary Tract: 16 mm low-density right adrenal nodule is
stable and compatible with adenoma. 17 mm stable left adrenal nodule
also compatible with adenoma.

No stones are seen in either kidney or ureter

Imaging after IV contrast administration shows no suspicious
enhancing lesion in either kidney. There is some cortical scarring
in the lower pole the right kidney.

Delayed imaging shows no wall thickening or soft tissue filling
defect in either intrarenal collecting system or renal pelvis.
Neither ureter is completely opacified but there is no focal
hydroureter or mass lesion along the length of either ureter.
Bladder surgically absent. Right abdominal ileal diversion is
nondilated.

Stomach/Bowel: Stomach is unremarkable. No gastric wall thickening.
No evidence of outlet obstruction. Duodenum is normally positioned
as is the ligament of Treitz. No small bowel wall thickening. No
small bowel dilatation. The terminal ileum is normal. The appendix
is normal. No gross colonic mass. No colonic wall thickening.
Diverticular changes are noted in the left colon without evidence of
diverticulitis.

Vascular/Lymphatic: There is abdominal aortic atherosclerosis
without aneurysm. There is no gastrohepatic or hepatoduodenal
ligament lymphadenopathy. No retroperitoneal or mesenteric
lymphadenopathy. No pelvic sidewall lymphadenopathy.

Reproductive: Uterus surgically absent.  There is no adnexal mass.

Other: No intraperitoneal free fluid.

Musculoskeletal: Small paraumbilical hernia contains short segment
small bowel including small bowel anastomosis, without complicating
features. No worrisome lytic or sclerotic osseous abnormality.
IMPRESSION: 1. Interval decrease in size of the right upper and right lower lobe
pulmonary nodules. Mediastinal lymph nodes have also decreased in
size since PET-CT of 01/16/2019. No new or progressive findings in
the chest.
2. No evidence for metastatic disease in the abdomen or pelvis.
3. Stable bilateral adrenal adenomas.
4. Small paraumbilical hernia contains short segment small bowel
without complicating features.
5. Aortic Atherosclerosis (PAA66-V4Y.Y) and Emphysema (PAA66-R8L.N).

## 2020-06-29 ENCOUNTER — Encounter (HOSPITAL_COMMUNITY): Payer: Self-pay

## 2020-06-29 ENCOUNTER — Inpatient Hospital Stay: Payer: Medicaid Other | Attending: Oncology

## 2020-06-29 ENCOUNTER — Other Ambulatory Visit: Payer: Self-pay

## 2020-06-29 ENCOUNTER — Ambulatory Visit (HOSPITAL_COMMUNITY)
Admission: RE | Admit: 2020-06-29 | Discharge: 2020-06-29 | Disposition: A | Discharge status: Home / Self Care | Payer: Medicaid Other | Source: Ambulatory Visit | Attending: Oncology | Admitting: Oncology

## 2020-06-29 DIAGNOSIS — C679 Malignant neoplasm of bladder, unspecified: Secondary | ICD-10-CM | POA: Diagnosis not present

## 2020-06-29 DIAGNOSIS — C3490 Malignant neoplasm of unspecified part of unspecified bronchus or lung: Secondary | ICD-10-CM | POA: Diagnosis present

## 2020-06-29 DIAGNOSIS — I745 Embolism and thrombosis of iliac artery: Secondary | ICD-10-CM | POA: Insufficient documentation

## 2020-06-29 DIAGNOSIS — Z923 Personal history of irradiation: Secondary | ICD-10-CM | POA: Insufficient documentation

## 2020-06-29 DIAGNOSIS — R059 Cough, unspecified: Secondary | ICD-10-CM | POA: Diagnosis not present

## 2020-06-29 DIAGNOSIS — Z9221 Personal history of antineoplastic chemotherapy: Secondary | ICD-10-CM | POA: Insufficient documentation

## 2020-06-29 DIAGNOSIS — Z79899 Other long term (current) drug therapy: Secondary | ICD-10-CM | POA: Insufficient documentation

## 2020-06-29 DIAGNOSIS — C3431 Malignant neoplasm of lower lobe, right bronchus or lung: Secondary | ICD-10-CM

## 2020-06-29 DIAGNOSIS — J984 Other disorders of lung: Secondary | ICD-10-CM | POA: Insufficient documentation

## 2020-06-29 HISTORY — DX: Malignant (primary) neoplasm, unspecified: C80.1

## 2020-06-29 LAB — CBC WITH DIFFERENTIAL (CANCER CENTER ONLY)
Abs Immature Granulocytes: 0.03 10*3/uL (ref 0.00–0.07)
Basophils Absolute: 0.1 10*3/uL (ref 0.0–0.1)
Basophils Relative: 1 %
Eosinophils Absolute: 0.1 10*3/uL (ref 0.0–0.5)
Eosinophils Relative: 1 %
HCT: 40.8 % (ref 36.0–46.0)
Hemoglobin: 13.3 g/dL (ref 12.0–15.0)
Immature Granulocytes: 0 %
Lymphocytes Relative: 9 %
Lymphs Abs: 0.9 10*3/uL (ref 0.7–4.0)
MCH: 30.3 pg (ref 26.0–34.0)
MCHC: 32.6 g/dL (ref 30.0–36.0)
MCV: 92.9 fL (ref 80.0–100.0)
Monocytes Absolute: 0.9 10*3/uL (ref 0.1–1.0)
Monocytes Relative: 9 %
Neutro Abs: 7.8 10*3/uL — ABNORMAL HIGH (ref 1.7–7.7)
Neutrophils Relative %: 80 %
Platelet Count: 285 10*3/uL (ref 150–400)
RBC: 4.39 MIL/uL (ref 3.87–5.11)
RDW: 15 % (ref 11.5–15.5)
WBC Count: 9.7 10*3/uL (ref 4.0–10.5)
nRBC: 0 % (ref 0.0–0.2)

## 2020-06-29 LAB — CMP (CANCER CENTER ONLY)
ALT: 9 U/L (ref 0–44)
AST: 11 U/L — ABNORMAL LOW (ref 15–41)
Albumin: 3.1 g/dL — ABNORMAL LOW (ref 3.5–5.0)
Alkaline Phosphatase: 88 U/L (ref 38–126)
Anion gap: 8 (ref 5–15)
BUN: 13 mg/dL (ref 8–23)
CO2: 28 mmol/L (ref 22–32)
Calcium: 8.7 mg/dL — ABNORMAL LOW (ref 8.9–10.3)
Chloride: 104 mmol/L (ref 98–111)
Creatinine: 0.83 mg/dL (ref 0.44–1.00)
GFR, Estimated: >60 mL/min (ref >60)
Glucose, Bld: 105 mg/dL — ABNORMAL HIGH (ref 70–99)
Potassium: 4.2 mmol/L (ref 3.5–5.1)
Sodium: 140 mmol/L (ref 135–145)
Total Bilirubin: 0.3 mg/dL (ref 0.3–1.2)
Total Protein: 6.9 g/dL (ref 6.5–8.1)

## 2020-06-29 LAB — TSH: TSH: 2.546 u[IU]/mL (ref 0.308–3.960)

## 2020-06-29 MED ORDER — IOHEXOL 300 MG/ML  SOLN
100.0000 mL | Freq: Once | INTRAMUSCULAR | Status: AC | PRN
  Administered 2020-06-29: 100 mL via INTRAVENOUS

## 2020-06-30 ENCOUNTER — Inpatient Hospital Stay (HOSPITAL_BASED_OUTPATIENT_CLINIC_OR_DEPARTMENT_OTHER): Payer: Medicaid Other | Admitting: Oncology

## 2020-06-30 VITALS — BP 129/84 | HR 105 | Temp 97.3°F | Resp 16 | Ht 66.0 in | Wt 135.3 lb

## 2020-06-30 DIAGNOSIS — C679 Malignant neoplasm of bladder, unspecified: Secondary | ICD-10-CM | POA: Diagnosis not present

## 2020-06-30 DIAGNOSIS — R059 Cough, unspecified: Secondary | ICD-10-CM | POA: Diagnosis not present

## 2020-06-30 DIAGNOSIS — C3431 Malignant neoplasm of lower lobe, right bronchus or lung: Secondary | ICD-10-CM | POA: Diagnosis not present

## 2020-06-30 MED ORDER — AZITHROMYCIN 250 MG PO TABS: 250.0000 mg | ORAL_TABLET | Freq: Every day | ORAL | Status: DC

## 2020-06-30 MED ORDER — AZITHROMYCIN 250 MG PO TABS: 500.0000 mg | ORAL_TABLET | Freq: Every day | ORAL | Status: DC

## 2020-06-30 NOTE — Progress Notes (Signed)
Hematology and Oncology Follow Up Visit  Lauren Salazar [id number] 1961-02-13 60 y.o. 06/30/2020 1:27 PM College, [facility] @ [employer]   Principle Diagnosis: 59 year old woman with lung cancer diagnosed in August 2020.  She was found to have T4N2 adenocarcinoma at that time.  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   Prior Therapy:   She is status post robotic assisted laparoscopic cystectomy and pelvic exoneration including hysterectomy, bilateral salpingo-oophorectomy and ileoconduit diversion completed on February 01, 2019.  Radiation therapy with weekly chemotherapy utilizing carboplatin and paclitaxel.  Week 1 of chemotherapy started on May 14, 2019.  Therapy concluded in February 2021.   Durvalumab 10 mg/kg cycle 1 on Sep 03, 2019 every 14 days.  She completed 4 cycles of therapy.   Current therapy: Active surveillance   Interim History: Lauren Salazar returns today for a follow-up visit.  Since the last visit, she was diagnosed with COVID-19 illness in January 2022.  She was prescribed Z-Pak and prednisone with improvement in her symptoms.  She is still having issues with bronchitis sinus congestion as well as ear ache but her respiratory status has improved.  She no longer reporting any fevers or chills sweats.  Her appetite is reasonable and continues to attempt activities of daily living.  She is working less at this time.    Medications: Updated on review. Current Outpatient Medications  Medication Sig Dispense Refill  . acetaminophen (TYLENOL) 500 MG tablet Take 1,000 mg by mouth every 8 (eight) hours as needed (pain).     . benzonatate (TESSALON) 200 MG capsule TAKE 1 CAPSULE(200 MG) BY MOUTH THREE TIMES DAILY AS NEEDED FOR COUGH 30 capsule 2  . chlorpheniramine-HYDROcodone (TUSSIONEX) 10-8 MG/5ML SUER Take 5 mLs by mouth every 12 (twelve) hours as needed for cough. 140 mL  0  . gabapentin (NEURONTIN) 300 MG capsule Take 1 capsule (300 mg total) by mouth 3 (three) times daily. Take for significant back pain around the clock. 42 capsule 1  . guaiFENesin-dextromethorphan (ROBITUSSIN DM) 100-10 MG/5ML syrup Take 5-10 mLs by mouth every 4 (four) hours as needed for cough. 473 mL 0  . nicotine (NICODERM CQ - DOSED IN MG/24 HOURS) 21 mg/24hr patch Place 21 mg onto the skin daily.    . prochlorperazine (COMPAZINE) 10 MG tablet Take 1 tablet (10 mg total) by mouth every 6 (six) hours as needed for nausea or vomiting. (Patient not taking: Reported on 04/02/2019) 30 tablet 0  . sucralfate (CARAFATE) 1 g tablet Dissolve 1 tablet in 10 mL H20 and swallow 30 min prior to meals and bedtime PRN heartburn. 40 tablet 2  . traMADol (ULTRAM) 50 MG tablet Take 1 tablet (50 mg total) by mouth every 6 (six) hours as needed. 30 tablet 0   No current facility-administered medications for this visit.     Allergies: No Known Allergies      Physical Exam:   Blood pressure 129/84, pulse (!) 105, temperature (!) 97.3 F (36.3 C), temperature source Tympanic, resp. rate 16, height 5\' 6"  (1.676 m), weight 135 lb 4.8 oz (61.4 kg), SpO2 99 %.     ECOG: 1    General appearance: Comfortable appearing without any discomfort Head: Normocephalic without any trauma Oropharynx: Mucous membranes are moist and pink without any thrush or ulcers. Eyes: Pupils are equal and round reactive to light. Lymph nodes: No cervical, supraclavicular, inguinal or axillary lymphadenopathy.   Heart:regular rate and rhythm.  S1 and S2 without leg edema. Lung: Clear without any rhonchi or wheezes.  No dullness to percussion. Abdomin: Soft, nontender, nondistended with good bowel sounds.  No hepatosplenomegaly. Musculoskeletal: No joint deformity or effusion.  Full range of motion noted. Neurological: No deficits noted on motor, sensory and deep tendon reflex exam. Skin: No petechial rash or dryness.   Appeared moist.              Lab Results: Lab Results  Component Value Date   WBC 9.7 06/29/2020   HGB 13.3 06/29/2020   HCT 40.8 06/29/2020   MCV 92.9 06/29/2020   PLT 285 06/29/2020     Chemistry      Component Value Date/Time   NA 140 06/29/2020 1447   K 4.2 06/29/2020 1447   CL 104 06/29/2020 1447   CO2 28 06/29/2020 1447   BUN 13 06/29/2020 1447   CREATININE 0.83 06/29/2020 1447      Component Value Date/Time   CALCIUM 8.7 (L) 06/29/2020 1447   ALKPHOS 88 06/29/2020 1447   AST 11 (L) 06/29/2020 1447   ALT 9 06/29/2020 1447   BILITOT 0.3 06/29/2020 1447      IMPRESSION: 1. Significant interval increase in masslike consolidation of the dependent superior segment right lower lobe at the original site of FDG avid mass, highly concerning for locally recurrent lung malignancy. 2. There is otherwise an interval increase in consolidation and architectural distortion of the perihilar right lung, reflecting developing radiation fibrosis. 3. Status post cystectomy and ileal conduit urinary diversion with right lower quadrant urostomy. 4. No evidence of lymphadenopathy or metastatic disease within the chest, abdomen, or pelvis. 5. Stable bilateral adrenal nodules, not previously FDG avid and consistent with benign adenomata. 6. 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   Impression and Plan:   60 year old woman with:    1.  T4N2 adenocarcinoma of the right lung diagnosed in August 2020.  Disease status was updated at this time and the CT scan on March 7 was reviewed.  It was discussed today with the patient personally and treatment options were discussed.  CT scan showed suggestion of progression of disease at the right lower lobe although no other areas of metastasis noted.  Treatment options discussed at this time including restarting immunotherapy versus systemic chemotherapy versus oral targeted  therapy.  Genomic Testing has been requested and currently pending.  Depending on these results we will tailor her treatment accordingly.  She has a driver mutation and we preferable to treat her that way versus immunotherapy or systemic chemotherapy.  We will await these results and consider treatment accordingly.  We will await the start of any additional therapy once her acute respiratory illness improves.  2.  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  She has no evidence of relapsed disease at this time.  I recommended continued active surveillance.     3.  Goals of care and prognosis: Aggressive measures are warranted at this time.  Her performance status and disease status remains treatable.  Is unlikely to be curable however.   4.  Cough: will not prescribe another course of Z-Pak given her symptoms.  6.  Follow-up: In the next few weeks to follow her progress.   30  minutes were spent on this encounter.  The time was dedicated to reviewing imaging studies, discussing treatment options and plan of care review.  Zola Button, MD 3/8/20221:27 PM

## 2020-07-01 ENCOUNTER — Other Ambulatory Visit: Payer: Self-pay | Admitting: Oncology

## 2020-07-01 ENCOUNTER — Ambulatory Visit: Payer: Medicaid Other | Admitting: Oncology

## 2020-07-01 MED ORDER — AZITHROMYCIN 250 MG PO TABS: ORAL_TABLET | ORAL | 0 refills | Status: DC

## 2020-07-02 ENCOUNTER — Encounter (HOSPITAL_COMMUNITY): Payer: Self-pay

## 2020-07-13 ENCOUNTER — Telehealth: Payer: Self-pay | Admitting: Oncology

## 2020-07-13 NOTE — Telephone Encounter (Signed)
R/s 3/25 appt per patient request. Called and spoke with patient. Confirmed new date and time

## 2020-07-17 ENCOUNTER — Ambulatory Visit: Payer: Medicaid Other | Admitting: Oncology

## 2020-07-22 ENCOUNTER — Inpatient Hospital Stay (HOSPITAL_BASED_OUTPATIENT_CLINIC_OR_DEPARTMENT_OTHER): Payer: Medicaid Other | Admitting: Oncology

## 2020-07-22 DIAGNOSIS — Z7189 Other specified counseling: Secondary | ICD-10-CM

## 2020-07-22 NOTE — Progress Notes (Signed)
Hematology and Oncology Follow Up for Telemedicine Visits  Lauren Salazar [id number] 1960/10/13 60 y.o. 07/22/2020 12:33 PM [facility], [employer] [facility] [employer]   I connected with Ms. [name] on 07/22/20 at  1:15 PM EDT by telephone visit and verified that I am speaking with the correct person using two identifiers.   I discussed the limitations, risks, security and privacy concerns of performing an evaluation and management service by telemedicine and the availability of in-person appointments. I also discussed with the patient that there may be a patient responsible charge related to this service. The patient expressed understanding and agreed to proceed.  Other persons participating in the visit and their role in the encounter:  None  Patient's location: Home Provider's location: Office    Principle Diagnosis: 60 year old woman with stage IV adenocarcinoma of the lung documented in March 2022 after initially presenting with T4N2 disease in August 2020.   Prior Therapy:  She is status post robotic assisted laparoscopic cystectomy and pelvic exoneration including hysterectomy, bilateral salpingo-oophorectomy and ileoconduit diversion completed on February 01, 2019.  Radiation therapy with weekly chemotherapy utilizing carboplatin and paclitaxel.  Week 1 of chemotherapy started on May 14, 2019.  Therapy concluded in February 2021.   Durvalumab 10 mg/kg cycle 1 on Sep 03, 2019 every 14 days.  She completed 4 cycles of therapy.   Current therapy: Under evaluation for subsequent therapy.  Interim History: [name] reports no major changes in her health.  She has reported continuous cough which has improved slightly since the last time.  She denies any fevers chills sweats.  Her appetite and performance status remains excellent at this time.  She denies any recent hospitalization or illnesses.     Medications: I have reviewed the patient's  current medications.  Current Outpatient Medications  Medication Sig Dispense Refill  . acetaminophen (TYLENOL) 500 MG tablet Take 1,000 mg by mouth every 8 (eight) hours as needed (pain).     Marland Kitchen azithromycin (ZITHROMAX Z-PAK) 250 MG tablet Take two on day one. Take one daily after that for 4 days. 6 each 0  . benzonatate (TESSALON) 200 MG capsule TAKE 1 CAPSULE(200 MG) BY MOUTH THREE TIMES DAILY AS NEEDED FOR COUGH 30 capsule 2  . chlorpheniramine-HYDROcodone (TUSSIONEX) 10-8 MG/5ML SUER Take 5 mLs by mouth every 12 (twelve) hours as needed for cough. 140 mL 0  . gabapentin (NEURONTIN) 300 MG capsule Take 1 capsule (300 mg total) by mouth 3 (three) times daily. Take for significant back pain around the clock. 42 capsule 1  . guaiFENesin-dextromethorphan (ROBITUSSIN DM) 100-10 MG/5ML syrup Take 5-10 mLs by mouth every 4 (four) hours as needed for cough. 473 mL 0  . nicotine (NICODERM CQ - DOSED IN MG/24 HOURS) 21 mg/24hr patch Place 21 mg onto the skin daily.    . prochlorperazine (COMPAZINE) 10 MG tablet Take 1 tablet (10 mg total) by mouth every 6 (six) hours as needed for nausea or vomiting. (Patient not taking: Reported on 04/02/2019) 30 tablet 0  . sucralfate (CARAFATE) 1 g tablet Dissolve 1 tablet in 10 mL H20 and swallow 30 min prior to meals and bedtime PRN heartburn. 40 tablet 2  . traMADol (ULTRAM) 50 MG tablet Take 1 tablet (50 mg total) by mouth every 6 (six) hours as needed. 30 tablet 0   No current facility-administered medications for this visit.     Allergies: No Known Allergies     Lab Results: Lab Results  Component Value Date  WBC 9.7 06/29/2020   HGB 13.3 06/29/2020   HCT 40.8 06/29/2020   MCV 92.9 06/29/2020   PLT 285 06/29/2020     Chemistry      Component Value Date/Time   NA 140 06/29/2020 1447   K 4.2 06/29/2020 1447   CL 104 06/29/2020 1447   CO2 28 06/29/2020 1447   BUN 13 06/29/2020 1447   CREATININE 0.83 06/29/2020 1447      Component Value  Date/Time   CALCIUM 8.7 (L) 06/29/2020 1447   ALKPHOS 88 06/29/2020 1447   AST 11 (L) 06/29/2020 1447   ALT 9 06/29/2020 1447   BILITOT 0.3 06/29/2020 1447       Radiological Studies:  IMPRESSION: 1. Significant interval increase in masslike consolidation of the dependent superior segment right lower lobe at the original site of FDG avid mass, highly concerning for locally recurrent lung malignancy. 2. There is otherwise an interval increase in consolidation and architectural distortion of the perihilar right lung, reflecting developing radiation fibrosis. 3. Status post cystectomy and ileal conduit urinary diversion with right lower quadrant urostomy. 4. No evidence of lymphadenopathy or metastatic disease within the chest, abdomen, or pelvis. 5. Stable bilateral adrenal nodules, not previously FDG avid and consistent with benign adenomata. 6. 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 7. Emphysema.   Impression and Plan:  1.   Lung cancer diagnosed in August 2020.  She presented with T4N2 adenocarcinoma and currently has progression of disease based on imaging studies on June 30, 2020.  Molecular testing on her tissue biopsy did not show any driver mutation at this time.  Treatment options moving forward were discussed at this time.  She does not have any actionable mutation and systemic chemotherapy is indicated.  She would be a good candidate for carboplatin, Alimta with Pembrolizumab every 3 weeks.  Complications associated with this therapy include nausea, fatigue, myelosuppression, vitamin deficiency and immune mediated complications.  After discussion today she is agreeable to proceed.      2.  Goals of care and prognosis: Treatment will be palliative at this time.   3.  Cough:  Likely related to her cancer and manageable at this time with the current medication.  4.  IV access: Risks and benefits of  Port-A-Cath insertion were discussed at this time.  Potential complications including bleeding, thrombosis among others.  She continues to refuse a Port-A-Cath for the time being.  5.  Follow-up: Will be in the near future to start therapy.      I discussed the assessment and treatment plan with the patient. The patient was provided an opportunity to ask questions and all were answered. The patient agreed with the plan and demonstrated an understanding of the instructions.   The patient was advised to call back or seek an in-person evaluation if the symptoms worsen or if the condition fails to improve as anticipated.  I provided 20 minutes of non face-to-face telephone visit time during this encounter.  The time was dedicated to reviewing her disease status, reviewing laboratory data and discussing treatment option complications noted therapy.  Zola Button, MD 07/22/2020 12:33 PM

## 2020-07-22 NOTE — Progress Notes (Signed)
DISCONTINUE ON PATHWAY REGIMEN - Non-Small Cell Lung     Administer weekly:     Paclitaxel      Carboplatin   **Always confirm dose/schedule in your pharmacy ordering system**  REASON: Disease Progression PRIOR TREATMENT: [id number]: Carboplatin AUC=2 + Paclitaxel 45 mg/m2 Weekly During Radiation TREATMENT RESPONSE: Stable Disease (SD)  START OFF PATHWAY REGIMEN - Non-Small Cell Lung   [id number]:Pembrolizumab 200 mg  IV D1 + Pemetrexed 500 mg/m2 IV D1 + Carboplatin AUC=5 IV D1 q21 Days:   A cycle is every 21 days:     Pembrolizumab      Pemetrexed      Carboplatin   **Always confirm dose/schedule in your pharmacy ordering system**  Patient Characteristics: Local Recurrence Therapeutic Status: Local Recurrence Intent of Therapy: Non-Curative / Palliative Intent, Discussed with Patient

## 2020-07-27 ENCOUNTER — Telehealth: Payer: Self-pay | Admitting: Oncology

## 2020-07-27 DIAGNOSIS — M5430 Sciatica, unspecified side: Secondary | ICD-10-CM | POA: Insufficient documentation

## 2020-07-27 DIAGNOSIS — I700 Atherosclerosis of aorta: Secondary | ICD-10-CM | POA: Insufficient documentation

## 2020-07-27 DIAGNOSIS — G8929 Other chronic pain: Secondary | ICD-10-CM | POA: Insufficient documentation

## 2020-07-27 DIAGNOSIS — Z720 Tobacco use: Secondary | ICD-10-CM | POA: Insufficient documentation

## 2020-07-27 NOTE — Telephone Encounter (Signed)
Scheduled follow-up appointment per 3/30 los. Patient is aware.

## 2020-07-28 NOTE — Progress Notes (Signed)
Pharmacist Chemotherapy Monitoring - Initial Assessment    Anticipated start date: 08/04/20   Regimen:  . Are orders appropriate based on the patient's diagnosis, regimen, and cycle? Yes . Does the plan date match the patient's scheduled date? Yes . Is the sequencing of drugs appropriate? Yes . Are the premedications appropriate for the patient's regimen? Yes . Prior Authorization for treatment is: Approved o If applicable, is the correct biosimilar selected based on the patient's insurance? not applicable  Organ Function and Labs: Marland Kitchen Are dose adjustments needed based on the patient's renal function, hepatic function, or hematologic function? Yes . Are appropriate labs ordered prior to the start of patient's treatment? Yes . Other organ system assessment, if indicated: N/A . The following baseline labs, if indicated, have been ordered: pembrolizumab: baseline TSH +/- T4  Dose Assessment: . Are the drug doses appropriate? Yes . Are the following correct: o Drug concentrations Yes o IV fluid compatible with drug Yes o Administration routes Yes o Timing of therapy Yes . If applicable, does the patient have documented access for treatment and/or plans for port-a-cath placement? no . If applicable, have lifetime cumulative doses been properly documented and assessed? yes Lifetime Dose Tracking  . Carboplatin: 780 mg = 0.01 % of the maximum lifetime dose of 999,999,999 mg  o   Toxicity Monitoring/Prevention: . The patient has the following take home antiemetics prescribed: Prochlorperazine . The patient has the following take home medications prescribed: folic acid for pemetrexed . Medication allergies and previous infusion related reactions, if applicable, have been reviewed and addressed. Yes . The patient's current medication list has been assessed for drug-drug interactions with their chemotherapy regimen. no significant drug-drug interactions were identified on review.  Order  Review: . Are the treatment plan orders signed? Yes . Is the patient scheduled to see a provider prior to their treatment? No  I verify that I have reviewed each item in the above checklist and answered each question accordingly.  Philomena Course, [place], 07/28/2020  11:52 AM

## 2020-08-04 ENCOUNTER — Other Ambulatory Visit: Payer: Self-pay | Admitting: Oncology

## 2020-08-04 ENCOUNTER — Inpatient Hospital Stay: Payer: Medicaid Other | Attending: Oncology

## 2020-08-04 ENCOUNTER — Other Ambulatory Visit: Payer: Self-pay

## 2020-08-04 ENCOUNTER — Inpatient Hospital Stay: Payer: Medicaid Other

## 2020-08-04 VITALS — BP 146/86 | HR 100 | Temp 98.0°F | Resp 16 | Wt 133.8 lb

## 2020-08-04 DIAGNOSIS — C3490 Malignant neoplasm of unspecified part of unspecified bronchus or lung: Secondary | ICD-10-CM

## 2020-08-04 DIAGNOSIS — C3431 Malignant neoplasm of lower lobe, right bronchus or lung: Secondary | ICD-10-CM

## 2020-08-04 DIAGNOSIS — E538 Deficiency of other specified B group vitamins: Secondary | ICD-10-CM | POA: Insufficient documentation

## 2020-08-04 DIAGNOSIS — C3432 Malignant neoplasm of lower lobe, left bronchus or lung: Secondary | ICD-10-CM | POA: Diagnosis not present

## 2020-08-04 DIAGNOSIS — Z79899 Other long term (current) drug therapy: Secondary | ICD-10-CM | POA: Insufficient documentation

## 2020-08-04 DIAGNOSIS — Z5111 Encounter for antineoplastic chemotherapy: Secondary | ICD-10-CM | POA: Diagnosis present

## 2020-08-04 LAB — CMP (CANCER CENTER ONLY)
ALT: 7 U/L (ref 0–44)
AST: 12 U/L — ABNORMAL LOW (ref 15–41)
Albumin: 3.5 g/dL (ref 3.5–5.0)
Alkaline Phosphatase: 96 U/L (ref 38–126)
Anion gap: 13 (ref 5–15)
BUN: 20 mg/dL (ref 8–23)
CO2: 23 mmol/L (ref 22–32)
Calcium: 9 mg/dL (ref 8.9–10.3)
Chloride: 106 mmol/L (ref 98–111)
Creatinine: 0.95 mg/dL (ref 0.44–1.00)
GFR, Estimated: >60 mL/min (ref >60)
Glucose, Bld: 141 mg/dL — ABNORMAL HIGH (ref 70–99)
Potassium: 3.7 mmol/L (ref 3.5–5.1)
Sodium: 142 mmol/L (ref 135–145)
Total Bilirubin: <0.2 mg/dL — ABNORMAL LOW (ref 0.3–1.2)
Total Protein: 7.1 g/dL (ref 6.5–8.1)

## 2020-08-04 LAB — CBC WITH DIFFERENTIAL (CANCER CENTER ONLY)
Abs Immature Granulocytes: 0.04 10*3/uL (ref 0.00–0.07)
Basophils Absolute: 0.1 10*3/uL (ref 0.0–0.1)
Basophils Relative: 1 %
Eosinophils Absolute: 0.1 10*3/uL (ref 0.0–0.5)
Eosinophils Relative: 1 %
HCT: 43.3 % (ref 36.0–46.0)
Hemoglobin: 14.2 g/dL (ref 12.0–15.0)
Immature Granulocytes: 0 %
Lymphocytes Relative: 12 %
Lymphs Abs: 1.2 10*3/uL (ref 0.7–4.0)
MCH: 30.1 pg (ref 26.0–34.0)
MCHC: 32.8 g/dL (ref 30.0–36.0)
MCV: 91.7 fL (ref 80.0–100.0)
Monocytes Absolute: 0.7 10*3/uL (ref 0.1–1.0)
Monocytes Relative: 7 %
Neutro Abs: 7.3 10*3/uL (ref 1.7–7.7)
Neutrophils Relative %: 79 %
Platelet Count: 314 10*3/uL (ref 150–400)
RBC: 4.72 MIL/uL (ref 3.87–5.11)
RDW: 14.3 % (ref 11.5–15.5)
WBC Count: 9.4 10*3/uL (ref 4.0–10.5)
nRBC: 0 % (ref 0.0–0.2)

## 2020-08-04 LAB — TSH: TSH: 2.244 u[IU]/mL (ref 0.308–3.960)

## 2020-08-04 MED ORDER — SODIUM CHLORIDE 0.9 % IV SOLN
10.0000 mg | Freq: Once | INTRAVENOUS | Status: AC
  Administered 2020-08-04: 10 mg via INTRAVENOUS
  Filled 2020-08-04: qty 10

## 2020-08-04 MED ORDER — SODIUM CHLORIDE 0.9 % IV SOLN
500.0000 mg/m2 | Freq: Once | INTRAVENOUS | Status: AC
  Administered 2020-08-04: 850 mg via INTRAVENOUS
  Filled 2020-08-04: qty 20

## 2020-08-04 MED ORDER — SODIUM CHLORIDE 0.9 % IV SOLN
200.0000 mg | Freq: Once | INTRAVENOUS | Status: AC
  Administered 2020-08-04: 200 mg via INTRAVENOUS
  Filled 2020-08-04: qty 8

## 2020-08-04 MED ORDER — CYANOCOBALAMIN 1000 MCG/ML IJ SOLN
1000.0000 ug | Freq: Once | INTRAMUSCULAR | Status: AC
  Administered 2020-08-04: 1000 ug via INTRAMUSCULAR

## 2020-08-04 MED ORDER — CYANOCOBALAMIN 1000 MCG/ML IJ SOLN
INTRAMUSCULAR | Status: AC
  Filled 2020-08-04: qty 1

## 2020-08-04 MED ORDER — SODIUM CHLORIDE 0.9% FLUSH
10.0000 mL | INTRAVENOUS | Status: DC | PRN
  Filled 2020-08-04: qty 10

## 2020-08-04 MED ORDER — HEPARIN SOD (PORK) LOCK FLUSH 100 UNIT/ML IV SOLN
500.0000 [IU] | Freq: Once | INTRAVENOUS | Status: DC | PRN
  Filled 2020-08-04: qty 5

## 2020-08-04 MED ORDER — SODIUM CHLORIDE 0.9 % IV SOLN
430.0000 mg | Freq: Once | INTRAVENOUS | Status: AC
  Administered 2020-08-04: 430 mg via INTRAVENOUS
  Filled 2020-08-04: qty 43

## 2020-08-04 MED ORDER — PALONOSETRON HCL INJECTION 0.25 MG/5ML
INTRAVENOUS | Status: AC
  Filled 2020-08-04: qty 5

## 2020-08-04 MED ORDER — PALONOSETRON HCL INJECTION 0.25 MG/5ML
0.2500 mg | Freq: Once | INTRAVENOUS | Status: AC
  Administered 2020-08-04: 0.25 mg via INTRAVENOUS

## 2020-08-04 MED ORDER — SODIUM CHLORIDE 0.9 % IV SOLN
Freq: Once | INTRAVENOUS | Status: AC
  Filled 2020-08-04: qty 250

## 2020-08-04 MED ORDER — FOLIC ACID 1 MG PO TABS: 1.0000 mg | ORAL_TABLET | Freq: Every day | ORAL | 3 refills | Status: DC

## 2020-08-04 MED ORDER — SODIUM CHLORIDE 0.9 % IV SOLN
150.0000 mg | Freq: Once | INTRAVENOUS | Status: AC
  Administered 2020-08-04: 150 mg via INTRAVENOUS
  Filled 2020-08-04: qty 150

## 2020-08-11 ENCOUNTER — Other Ambulatory Visit: Payer: Self-pay | Admitting: Oncology

## 2020-08-11 ENCOUNTER — Telehealth: Payer: Self-pay | Admitting: *Deleted

## 2020-08-11 MED ORDER — HYDROCODONE-ACETAMINOPHEN 5-325 MG PO TABS: 1.0000 | ORAL_TABLET | Freq: Four times a day (QID) | ORAL | 0 refills | Status: DC | PRN

## 2020-08-11 NOTE — Telephone Encounter (Signed)
PC to patient, informed her that Dr. Alen Blew stated some of these issues are to be expected with chemo.  Instructed patient to monitor her urine and to report back if she notices pink or red urine, blood clots in her urine, or if appearance of urine is different from usual. She verbalizes understanding.  Patient requesting pain medication for severe lung pain, Dr. Alen Blew informed.

## 2020-08-11 NOTE — Telephone Encounter (Signed)
-----   Message from Wyatt Portela, MD sent at 08/11/2020  2:25 PM EDT ----- Regarding: RE: Post chemo complications Some of these issues are expected with chemo. She needs to watch for more hematuira and rpeort again if it's happening more often. Thanks ----- Message ----- From: Rolene Course, RN Sent: 08/11/2020   2:19 PM EDT To: Wyatt Portela, MD Subject: Post chemo complications                       Received a phone call from this patient, she had D1C1 Keytruda/Alimta/Carbo on 08/04/20.  She states she had some chest pain that evening & the next day which went away, was also tired & weak x 2 days.  She now states she began having severe right lung pain (right side & right back) last night, is still there today but some better.  She took ibuprofen for this.  She also noted hematuria 3 days ago during her shower & noticed it again today while showering.  Please advise, thank you.

## 2020-08-25 ENCOUNTER — Other Ambulatory Visit: Payer: Self-pay

## 2020-08-25 ENCOUNTER — Ambulatory Visit: Payer: Medicaid Other

## 2020-08-25 ENCOUNTER — Inpatient Hospital Stay: Payer: Medicaid Other | Attending: Oncology

## 2020-08-25 ENCOUNTER — Inpatient Hospital Stay (HOSPITAL_BASED_OUTPATIENT_CLINIC_OR_DEPARTMENT_OTHER): Payer: Medicaid Other | Admitting: Oncology

## 2020-08-25 VITALS — BP 129/84 | HR 105 | Temp 97.9°F | Resp 17 | Wt 133.3 lb

## 2020-08-25 VITALS — HR 97

## 2020-08-25 DIAGNOSIS — M2550 Pain in unspecified joint: Secondary | ICD-10-CM | POA: Insufficient documentation

## 2020-08-25 DIAGNOSIS — Z90710 Acquired absence of both cervix and uterus: Secondary | ICD-10-CM | POA: Insufficient documentation

## 2020-08-25 DIAGNOSIS — Z5111 Encounter for antineoplastic chemotherapy: Secondary | ICD-10-CM | POA: Insufficient documentation

## 2020-08-25 DIAGNOSIS — Z90722 Acquired absence of ovaries, bilateral: Secondary | ICD-10-CM | POA: Insufficient documentation

## 2020-08-25 DIAGNOSIS — C3431 Malignant neoplasm of lower lobe, right bronchus or lung: Secondary | ICD-10-CM

## 2020-08-25 DIAGNOSIS — M791 Myalgia, unspecified site: Secondary | ICD-10-CM | POA: Insufficient documentation

## 2020-08-25 DIAGNOSIS — R059 Cough, unspecified: Secondary | ICD-10-CM | POA: Diagnosis not present

## 2020-08-25 DIAGNOSIS — E538 Deficiency of other specified B group vitamins: Secondary | ICD-10-CM | POA: Diagnosis not present

## 2020-08-25 DIAGNOSIS — Z79899 Other long term (current) drug therapy: Secondary | ICD-10-CM | POA: Diagnosis not present

## 2020-08-25 DIAGNOSIS — C3490 Malignant neoplasm of unspecified part of unspecified bronchus or lung: Secondary | ICD-10-CM | POA: Diagnosis not present

## 2020-08-25 DIAGNOSIS — Z5112 Encounter for antineoplastic immunotherapy: Secondary | ICD-10-CM | POA: Diagnosis not present

## 2020-08-25 DIAGNOSIS — C679 Malignant neoplasm of bladder, unspecified: Secondary | ICD-10-CM | POA: Insufficient documentation

## 2020-08-25 DIAGNOSIS — H6011 Cellulitis of right external ear: Secondary | ICD-10-CM | POA: Insufficient documentation

## 2020-08-25 DIAGNOSIS — R5383 Other fatigue: Secondary | ICD-10-CM | POA: Insufficient documentation

## 2020-08-25 LAB — CBC WITH DIFFERENTIAL (CANCER CENTER ONLY)
Abs Immature Granulocytes: 0.02 10*3/uL (ref 0.00–0.07)
Basophils Absolute: 0 10*3/uL (ref 0.0–0.1)
Basophils Relative: 1 %
Eosinophils Absolute: 0.1 10*3/uL (ref 0.0–0.5)
Eosinophils Relative: 1 %
HCT: 38.3 % (ref 36.0–46.0)
Hemoglobin: 12.8 g/dL (ref 12.0–15.0)
Immature Granulocytes: 0 %
Lymphocytes Relative: 18 %
Lymphs Abs: 0.9 10*3/uL (ref 0.7–4.0)
MCH: 30.5 pg (ref 26.0–34.0)
MCHC: 33.4 g/dL (ref 30.0–36.0)
MCV: 91.4 fL (ref 80.0–100.0)
Monocytes Absolute: 0.5 10*3/uL (ref 0.1–1.0)
Monocytes Relative: 9 %
Neutro Abs: 3.8 10*3/uL (ref 1.7–7.7)
Neutrophils Relative %: 71 %
Platelet Count: 241 10*3/uL (ref 150–400)
RBC: 4.19 MIL/uL (ref 3.87–5.11)
RDW: 15.2 % (ref 11.5–15.5)
WBC Count: 5.3 10*3/uL (ref 4.0–10.5)
nRBC: 0 % (ref 0.0–0.2)

## 2020-08-25 LAB — CMP (CANCER CENTER ONLY)
ALT: 30 U/L (ref 0–44)
AST: 19 U/L (ref 15–41)
Albumin: 3.4 g/dL — ABNORMAL LOW (ref 3.5–5.0)
Alkaline Phosphatase: 95 U/L (ref 38–126)
Anion gap: 8 (ref 5–15)
BUN: 18 mg/dL (ref 8–23)
CO2: 27 mmol/L (ref 22–32)
Calcium: 9 mg/dL (ref 8.9–10.3)
Chloride: 102 mmol/L (ref 98–111)
Creatinine: 0.85 mg/dL (ref 0.44–1.00)
GFR, Estimated: >60 mL/min (ref >60)
Glucose, Bld: 129 mg/dL — ABNORMAL HIGH (ref 70–99)
Potassium: 3.7 mmol/L (ref 3.5–5.1)
Sodium: 137 mmol/L (ref 135–145)
Total Bilirubin: 0.2 mg/dL — ABNORMAL LOW (ref 0.3–1.2)
Total Protein: 7.3 g/dL (ref 6.5–8.1)

## 2020-08-25 LAB — TSH: TSH: 2.068 u[IU]/mL (ref 0.350–4.500)

## 2020-08-25 MED ORDER — SODIUM CHLORIDE 0.9 % IV SOLN
461.0000 mg | Freq: Once | INTRAVENOUS | Status: AC
  Administered 2020-08-25: 460 mg via INTRAVENOUS
  Filled 2020-08-25: qty 46

## 2020-08-25 MED ORDER — SODIUM CHLORIDE 0.9 % IV SOLN
500.0000 mg/m2 | Freq: Once | INTRAVENOUS | Status: AC
  Administered 2020-08-25: 850 mg via INTRAVENOUS
  Filled 2020-08-25: qty 20

## 2020-08-25 MED ORDER — PALONOSETRON HCL INJECTION 0.25 MG/5ML
INTRAVENOUS | Status: AC
  Filled 2020-08-25: qty 5

## 2020-08-25 MED ORDER — HYDROCODONE-ACETAMINOPHEN 5-325 MG PO TABS: 1.0000 | ORAL_TABLET | Freq: Four times a day (QID) | ORAL | 0 refills | Status: DC | PRN

## 2020-08-25 MED ORDER — DOXYCYCLINE HYCLATE 100 MG PO TABS: 100.0000 mg | ORAL_TABLET | Freq: Two times a day (BID) | ORAL | 0 refills | Status: DC

## 2020-08-25 MED ORDER — SODIUM CHLORIDE 0.9 % IV SOLN
10.0000 mg | Freq: Once | INTRAVENOUS | Status: AC
  Administered 2020-08-25: 10 mg via INTRAVENOUS
  Filled 2020-08-25: qty 10

## 2020-08-25 MED ORDER — SODIUM CHLORIDE 0.9 % IV SOLN
Freq: Once | INTRAVENOUS | Status: AC
  Filled 2020-08-25: qty 250

## 2020-08-25 MED ORDER — PEMBROLIZUMAB CHEMO INJECTION 100 MG/4ML
200.0000 mg | Freq: Once | INTRAVENOUS | Status: AC
  Administered 2020-08-25: 200 mg via INTRAVENOUS
  Filled 2020-08-25: qty 8

## 2020-08-25 MED ORDER — PALONOSETRON HCL INJECTION 0.25 MG/5ML
0.2500 mg | Freq: Once | INTRAVENOUS | Status: AC
  Administered 2020-08-25: 0.25 mg via INTRAVENOUS

## 2020-08-25 MED ORDER — SODIUM CHLORIDE 0.9 % IV SOLN
150.0000 mg | Freq: Once | INTRAVENOUS | Status: AC
  Administered 2020-08-25: 150 mg via INTRAVENOUS
  Filled 2020-08-25: qty 150

## 2020-08-25 NOTE — Progress Notes (Signed)
Hematology and Oncology Follow Up   Lauren Salazar [id number] 11-20-1960 60 y.o. 08/25/2020 11:40 AM [facility], [name] [employer]       Principle Diagnosis: 60 year old woman with lung cancer diagnosed in August 2020.  She has stage IV disease that has progressed in April 2022 after initially presenting with T4N2 disease.   Prior Therapy:  She is status post robotic assisted laparoscopic cystectomy and pelvic exoneration including hysterectomy, bilateral salpingo-oophorectomy and ileoconduit diversion completed on February 01, 2019.  Radiation therapy with weekly chemotherapy utilizing carboplatin and paclitaxel.  Week 1 of chemotherapy started on May 14, 2019.  Therapy concluded in February 2021.   Durvalumab 10 mg/kg cycle 1 on Sep 03, 2019 every 14 days.  She completed 4 cycles of therapy.   Current therapy: Carboplatin with Alimta and Pembrolizumab started on August 04, 2020.  She is here for cycle 2 of therapy.  Interim History: Lauren Salazar returns today for repeat evaluation.  Since the last visit, she received the first cycle of salvage therapy utilizing carboplatin, Alimta and Pembrolizumab.  She has reported some arthralgias and myalgias related to treatment as well as some fatigue.  She denies any nausea, vomiting or abdominal pain.  She did report some flank pain which has resolved with hydrocodone.  She did report some erythema on her right ear that is tender to touch.     Medications: Updated on review. Current Outpatient Medications  Medication Sig Dispense Refill  . acetaminophen (TYLENOL) 500 MG tablet Take 1,000 mg by mouth every 8 (eight) hours as needed (pain).     Marland Kitchen azithromycin (ZITHROMAX Z-PAK) 250 MG tablet Take two on day one. Take one daily after that for 4 days. 6 each 0  . benzonatate (TESSALON) 200 MG capsule TAKE 1 CAPSULE(200 MG) BY MOUTH THREE TIMES DAILY AS NEEDED FOR COUGH 30 capsule 2  .  chlorpheniramine-HYDROcodone (TUSSIONEX) 10-8 MG/5ML SUER Take 5 mLs by mouth every 12 (twelve) hours as needed for cough. 703 mL 0  . folic acid (FOLVITE) 1 MG tablet Take 1 tablet (1 mg total) by mouth daily. 90 tablet 3  . gabapentin (NEURONTIN) 300 MG capsule Take 1 capsule (300 mg total) by mouth 3 (three) times daily. Take for significant back pain around the clock. 42 capsule 1  . guaiFENesin-dextromethorphan (ROBITUSSIN DM) 100-10 MG/5ML syrup Take 5-10 mLs by mouth every 4 (four) hours as needed for cough. 473 mL 0  . HYDROcodone-acetaminophen (NORCO) 5-325 MG tablet Take 1 tablet by mouth every 6 (six) hours as needed for moderate pain. 30 tablet 0  . nicotine (NICODERM CQ - DOSED IN MG/24 HOURS) 21 mg/24hr patch Place 21 mg onto the skin daily.    . prochlorperazine (COMPAZINE) 10 MG tablet Take 1 tablet (10 mg total) by mouth every 6 (six) hours as needed for nausea or vomiting. (Patient not taking: Reported on 04/02/2019) 30 tablet 0  . sucralfate (CARAFATE) 1 g tablet Dissolve 1 tablet in 10 mL H20 and swallow 30 min prior to meals and bedtime PRN heartburn. 40 tablet 2  . traMADol (ULTRAM) 50 MG tablet Take 1 tablet (50 mg total) by mouth every 6 (six) hours as needed. 30 tablet 0   No current facility-administered medications for this visit.     Allergies: No Known Allergies   Physical exam Blood pressure 129/84, pulse (!) 105, temperature 97.9 F (36.6 C), temperature source Oral, resp. rate 17, weight 133 lb 4.8 oz (60.5 kg), SpO2  98 %.   ECOG 1  General appearance: Comfortable appearing without any discomfort Head: Normocephalic without any trauma Oropharynx: Mucous membranes are moist and pink without any thrush or ulcers. Eyes: Pupils are equal and round reactive to light. Lymph nodes: No cervical, supraclavicular, inguinal or axillary lymphadenopathy.   Heart:regular rate and rhythm.  S1 and S2 without leg edema. Lung: Clear without any rhonchi or wheezes.  No  dullness to percussion. Abdomin: Soft, nontender, nondistended with good bowel sounds.  No hepatosplenomegaly. Musculoskeletal: No joint deformity or effusion.  Full range of motion noted. Neurological: No deficits noted on motor, sensory and deep tendon reflex exam. Skin: Erythema noted on her right ear.   Lab Results: Lab Results  Component Value Date   WBC 9.4 08/04/2020   HGB 14.2 08/04/2020   HCT 43.3 08/04/2020   MCV 91.7 08/04/2020   PLT 314 08/04/2020     Chemistry      Component Value Date/Time   NA 142 08/04/2020 1337   K 3.7 08/04/2020 1337   CL 106 08/04/2020 1337   CO2 23 08/04/2020 1337   BUN 20 08/04/2020 1337   CREATININE 0.95 08/04/2020 1337      Component Value Date/Time   CALCIUM 9.0 08/04/2020 1337   ALKPHOS 96 08/04/2020 1337   AST 12 (L) 08/04/2020 1337   ALT 7 08/04/2020 1337   BILITOT <0.2 (L) 08/04/2020 1337          Impression and Plan:  1.   Stage IV adenocarcinoma of the lung documented in March 2022 with disease progression.     She is currently receiving salvage therapy with carboplatin, Alimta and Pembrolizumab.  She completed cycle 1 without any major complications.  Risks and benefits of continuing this treatment were discussed.  The plan is to complete 4 cycles of therapy and subsequently switch to maintenance potentially with Alimta and Pembrolizumab.  Laboratory data from today reviewed and showed normal neurological parameters.  She is agreeable to proceed at this time.    2.  Goals of care and prognosis: Her disease is incurable although her performance status is excellent and aggressive measures are warranted.   3.  Cough:  Improved since the last visit.  4.  IV access: Peripheral veins are currently in use without any issues.  Port-A-Cath has been offered and deferred for the time being.  5.  Antiemetics: Appropriate prescription will be available to her.  No nausea or vomiting reported.  6.  Arthralgias and  myalgias: Related to chemotherapy and manageable with hydrocodone.  7.  Right ear cellulitis: Prescription for doxycycline will be available to her.  8.  Vitamin deficiency: She is currently on folic acid replacement and she will receive B12 injections on a limited.   9.  Follow-up: In 3 weeks for the next cycle of therapy.    30  minutes were dedicated to this visit. The time was spent on reviewing laboratory data, disease status update, addressing complications related to her cancer and cancer therapy.  Zola Button, MD 08/25/2020 11:40 AM

## 2020-09-11 ENCOUNTER — Telehealth: Payer: Self-pay | Admitting: Oncology

## 2020-09-11 NOTE — Telephone Encounter (Signed)
Called pt to r/s appts per 5/19 sch msg. No answer. Left msg for pt to call back to r/s. I did let pt know as far as r/s infusions we would have to send her to another facility as [facility] is fully booked until 6/13 per RN Threasa Beards. I encouraged pt to keep appt if at all possible, but if not possible to call me back so we can discuss r/s options.

## 2020-09-14 ENCOUNTER — Telehealth: Payer: Self-pay | Admitting: *Deleted

## 2020-09-14 MED FILL — Fosaprepitant Dimeglumine For IV Infusion 150 MG (Base Eq): INTRAVENOUS | Qty: 5 | Status: AC

## 2020-09-14 MED FILL — Dexamethasone Sodium Phosphate Inj 100 MG/10ML: INTRAMUSCULAR | Qty: 1 | Status: AC

## 2020-09-14 NOTE — Telephone Encounter (Signed)
Patient called - states she has a cold, with fever, cough and chills. She wants to reschedule her Lab, MD and infusion appts for 5/24. She says she has had it for 1-2 weeks and has been in the bed this weekend because she feels so weak. She thinks she caught it from a visiting child - she states the child and their mother stated to her they had tested negative for covid.   Informed her that Dr. Alen Blew will be informed and a schedule message will be sent to r/s those appts. Call information routed to Dr. Alen Blew.   Advised and encouraged her to contact her PCP for evaluation and possible treatment. Asked her to contact this office with update. She verbalized understanding.

## 2020-09-15 ENCOUNTER — Inpatient Hospital Stay: Payer: Medicaid Other | Admitting: Oncology

## 2020-09-15 ENCOUNTER — Inpatient Hospital Stay: Payer: Medicaid Other

## 2020-09-15 ENCOUNTER — Encounter: Payer: Self-pay | Admitting: Oncology

## 2020-09-15 NOTE — Telephone Encounter (Signed)
Contacted patient and left message: Next appts will be 10/06/20 - appts originally on 5/24 cancelled and per Dr. Alen Blew do not need to be rescheduled.  Encouraged f/u with PCP for current symptoms and to contact this office for questions r/t appts.

## 2020-09-15 NOTE — Telephone Encounter (Signed)
Please cancel treatment on 5/24

## 2020-10-06 ENCOUNTER — Inpatient Hospital Stay: Payer: Medicaid Other | Attending: Oncology

## 2020-10-06 ENCOUNTER — Other Ambulatory Visit: Payer: Self-pay

## 2020-10-06 ENCOUNTER — Inpatient Hospital Stay: Payer: Medicaid Other

## 2020-10-06 ENCOUNTER — Inpatient Hospital Stay (HOSPITAL_BASED_OUTPATIENT_CLINIC_OR_DEPARTMENT_OTHER): Payer: Medicaid Other | Admitting: Oncology

## 2020-10-06 VITALS — HR 98

## 2020-10-06 VITALS — BP 119/85 | HR 102 | Temp 98.5°F | Resp 19 | Wt 130.9 lb

## 2020-10-06 DIAGNOSIS — C3411 Malignant neoplasm of upper lobe, right bronchus or lung: Secondary | ICD-10-CM | POA: Insufficient documentation

## 2020-10-06 DIAGNOSIS — M791 Myalgia, unspecified site: Secondary | ICD-10-CM | POA: Insufficient documentation

## 2020-10-06 DIAGNOSIS — R5383 Other fatigue: Secondary | ICD-10-CM | POA: Diagnosis not present

## 2020-10-06 DIAGNOSIS — C3490 Malignant neoplasm of unspecified part of unspecified bronchus or lung: Secondary | ICD-10-CM

## 2020-10-06 DIAGNOSIS — E538 Deficiency of other specified B group vitamins: Secondary | ICD-10-CM | POA: Diagnosis not present

## 2020-10-06 DIAGNOSIS — Z5112 Encounter for antineoplastic immunotherapy: Secondary | ICD-10-CM | POA: Diagnosis not present

## 2020-10-06 DIAGNOSIS — Z923 Personal history of irradiation: Secondary | ICD-10-CM | POA: Insufficient documentation

## 2020-10-06 DIAGNOSIS — M2550 Pain in unspecified joint: Secondary | ICD-10-CM | POA: Insufficient documentation

## 2020-10-06 DIAGNOSIS — Z79899 Other long term (current) drug therapy: Secondary | ICD-10-CM | POA: Diagnosis not present

## 2020-10-06 DIAGNOSIS — Z9221 Personal history of antineoplastic chemotherapy: Secondary | ICD-10-CM | POA: Insufficient documentation

## 2020-10-06 DIAGNOSIS — R110 Nausea: Secondary | ICD-10-CM | POA: Insufficient documentation

## 2020-10-06 DIAGNOSIS — C3431 Malignant neoplasm of lower lobe, right bronchus or lung: Secondary | ICD-10-CM

## 2020-10-06 LAB — CMP (CANCER CENTER ONLY)
ALT: 11 U/L (ref 0–44)
AST: 17 U/L (ref 15–41)
Albumin: 3.5 g/dL (ref 3.5–5.0)
Alkaline Phosphatase: 78 U/L (ref 38–126)
Anion gap: 11 (ref 5–15)
BUN: 16 mg/dL (ref 8–23)
CO2: 25 mmol/L (ref 22–32)
Calcium: 9.4 mg/dL (ref 8.9–10.3)
Chloride: 107 mmol/L (ref 98–111)
Creatinine: 0.85 mg/dL (ref 0.44–1.00)
GFR, Estimated: >60 mL/min (ref >60)
Glucose, Bld: 126 mg/dL — ABNORMAL HIGH (ref 70–99)
Potassium: 3.9 mmol/L (ref 3.5–5.1)
Sodium: 143 mmol/L (ref 135–145)
Total Bilirubin: 0.3 mg/dL (ref 0.3–1.2)
Total Protein: 7.4 g/dL (ref 6.5–8.1)

## 2020-10-06 LAB — CBC WITH DIFFERENTIAL (CANCER CENTER ONLY)
Abs Immature Granulocytes: 0.02 10*3/uL (ref 0.00–0.07)
Basophils Absolute: 0.1 10*3/uL (ref 0.0–0.1)
Basophils Relative: 1 %
Eosinophils Absolute: 0.1 10*3/uL (ref 0.0–0.5)
Eosinophils Relative: 2 %
HCT: 39.9 % (ref 36.0–46.0)
Hemoglobin: 13.1 g/dL (ref 12.0–15.0)
Immature Granulocytes: 0 %
Lymphocytes Relative: 13 %
Lymphs Abs: 1 10*3/uL (ref 0.7–4.0)
MCH: 31.5 pg (ref 26.0–34.0)
MCHC: 32.8 g/dL (ref 30.0–36.0)
MCV: 95.9 fL (ref 80.0–100.0)
Monocytes Absolute: 0.9 10*3/uL (ref 0.1–1.0)
Monocytes Relative: 11 %
Neutro Abs: 5.9 10*3/uL (ref 1.7–7.7)
Neutrophils Relative %: 73 %
Platelet Count: 258 10*3/uL (ref 150–400)
RBC: 4.16 MIL/uL (ref 3.87–5.11)
RDW: 17.4 % — ABNORMAL HIGH (ref 11.5–15.5)
WBC Count: 7.9 10*3/uL (ref 4.0–10.5)
nRBC: 0 % (ref 0.0–0.2)

## 2020-10-06 LAB — TSH: TSH: 2.834 u[IU]/mL (ref 0.308–3.960)

## 2020-10-06 MED ORDER — DEXAMETHASONE SODIUM PHOSPHATE 100 MG/10ML IJ SOLN
10.0000 mg | Freq: Once | INTRAMUSCULAR | Status: AC
  Administered 2020-10-06: 10 mg via INTRAVENOUS
  Filled 2020-10-06: qty 10

## 2020-10-06 MED ORDER — SODIUM CHLORIDE 0.9 % IV SOLN
150.0000 mg | Freq: Once | INTRAVENOUS | Status: AC
  Administered 2020-10-06: 150 mg via INTRAVENOUS
  Filled 2020-10-06: qty 150

## 2020-10-06 MED ORDER — SODIUM CHLORIDE 0.9 % IV SOLN
Freq: Once | INTRAVENOUS | Status: AC
  Filled 2020-10-06: qty 250

## 2020-10-06 MED ORDER — CYANOCOBALAMIN 1000 MCG/ML IJ SOLN
1000.0000 ug | Freq: Once | INTRAMUSCULAR | Status: AC
Start: 2020-10-06 — End: 2020-10-06
  Administered 2020-10-06: 1000 ug via INTRAMUSCULAR

## 2020-10-06 MED ORDER — PALONOSETRON HCL INJECTION 0.25 MG/5ML
INTRAVENOUS | Status: AC
  Filled 2020-10-06: qty 5

## 2020-10-06 MED ORDER — PALONOSETRON HCL INJECTION 0.25 MG/5ML
0.2500 mg | Freq: Once | INTRAVENOUS | Status: AC
Start: 2020-10-06 — End: 2020-10-06
  Administered 2020-10-06: 0.25 mg via INTRAVENOUS

## 2020-10-06 MED ORDER — SODIUM CHLORIDE 0.9 % IV SOLN
200.0000 mg | Freq: Once | INTRAVENOUS | Status: AC
  Administered 2020-10-06: 200 mg via INTRAVENOUS
  Filled 2020-10-06: qty 8

## 2020-10-06 MED ORDER — SODIUM CHLORIDE 0.9 % IV SOLN
490.0000 mg/m2 | Freq: Once | INTRAVENOUS | Status: AC
  Administered 2020-10-06: 800 mg via INTRAVENOUS
  Filled 2020-10-06: qty 20

## 2020-10-06 NOTE — Progress Notes (Signed)
Hematology and Oncology Follow Up   Lauren Salazar [id number] [birth year] 60 y.o. 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  She presented with T4N2 disease in 2020.  Prior Therapy:  She is status post robotic assisted laparoscopic cystectomy and pelvic exoneration including hysterectomy, bilateral salpingo-oophorectomy and ileoconduit diversion completed on February 01, 2019.   Radiation therapy with weekly chemotherapy utilizing carboplatin and paclitaxel.  Week 1 of chemotherapy started on May 14, 2019.  Therapy concluded in February 2021.    Durvalumab 10 mg/kg cycle 1 on Sep 03, 2019 every 14 days.  She completed 4 cycles of therapy.    Current therapy: Carboplatin with Alimta and Pembrolizumab started on August 04, 2020.  She is here for cycle 3 of therapy.  Interim History: [name] is here for a follow-up visit.  Since the last visit, she reports a few complications related to chemotherapy including generalized fatigue, nausea and overall muscle pain and weakness.  She has recovered some of the symptoms but not completely back to baseline.  She denies any nausea at this time although her appetite has decreased.  She still ambulating and attends to activities of daily living.     Medications: Unchanged on review. Current Outpatient Medications  Medication Sig Dispense Refill   acetaminophen (TYLENOL) 500 MG tablet Take 1,000 mg by mouth every 8 (eight) hours as needed (pain).      azithromycin (ZITHROMAX Z-PAK) 250 MG tablet Take two on day one. Take one daily after that for 4 days. 6 each 0   benzonatate (TESSALON) 200 MG capsule TAKE 1 CAPSULE(200 MG) BY MOUTH THREE TIMES DAILY AS NEEDED FOR COUGH 30 capsule 2   chlorpheniramine-HYDROcodone (TUSSIONEX) 10-8 MG/5ML SUER Take 5 mLs by mouth every 12 (twelve)  hours as needed for cough. 140 mL 0   doxycycline (VIBRA-TABS) 100 MG tablet Take 1 tablet (100 mg total) by mouth 2 (two) times daily. 10 tablet 0   folic acid (FOLVITE) 1 MG tablet Take 1 tablet (1 mg total) by mouth daily. 90 tablet 3   gabapentin (NEURONTIN) 300 MG capsule Take 1 capsule (300 mg total) by mouth 3 (three) times daily. Take for significant back pain around the clock. 42 capsule 1   guaiFENesin-dextromethorphan (ROBITUSSIN DM) 100-10 MG/5ML syrup Take 5-10 mLs by mouth every 4 (four) hours as needed for cough. 473 mL 0   HYDROcodone-acetaminophen (NORCO) 5-325 MG tablet Take 1 tablet by mouth every 6 (six) hours as needed for moderate pain. 30 tablet 0   nicotine (NICODERM CQ - DOSED IN MG/24 HOURS) 21 mg/24hr patch Place 21 mg onto the skin daily.     prochlorperazine (COMPAZINE) 10 MG tablet Take 1 tablet (10 mg total) by mouth every 6 (six) hours as needed for nausea or vomiting. (Patient not taking: Reported on 04/02/2019) 30 tablet 0   sucralfate (CARAFATE) 1 g tablet Dissolve 1 tablet in 10 mL H20 and swallow 30 min prior to meals and bedtime PRN heartburn. 40 tablet 2   traMADol (ULTRAM) 50 MG tablet Take 1 tablet (50 mg total) by mouth every 6 (six) hours as needed. 30 tablet 0   No current facility-administered medications for this visit.     Allergies: No Known Allergies   Physical exam  Blood pressure 119/85, pulse (!) 102, temperature 98.5 F (36.9 C), temperature source Oral,  resp. rate 19, weight 130 lb 14.4 oz (59.4 kg), SpO2 99 %.   ECOG 1     General appearance: Alert, awake without any distress. Head: Atraumatic without abnormalities Oropharynx: Without any thrush or ulcers. Eyes: No scleral icterus. Lymph nodes: No lymphadenopathy noted in the cervical, supraclavicular, or axillary nodes Heart:regular rate and rhythm, without any murmurs or gallops.   Lung: Clear to auscultation without any rhonchi, wheezes or dullness to percussion. Abdomin:  Soft, nontender without any shifting dullness or ascites. Musculoskeletal: No clubbing or cyanosis. Neurological: No motor or sensory deficits. Skin: No rashes or lesions.     Lab Results: Lab Results  Component Value Date   WBC 5.3 08/25/2020   HGB 12.8 08/25/2020   HCT 38.3 08/25/2020   MCV 91.4 08/25/2020   PLT 241 08/25/2020     Chemistry      Component Value Date/Time   NA 137 08/25/2020 1149   K 3.7 08/25/2020 1149   CL 102 08/25/2020 1149   CO2 27 08/25/2020 1149   BUN 18 08/25/2020 1149   CREATININE 0.85 08/25/2020 1149      Component Value Date/Time   CALCIUM 9.0 08/25/2020 1149   ALKPHOS 95 08/25/2020 1149   AST 19 08/25/2020 1149   ALT 30 08/25/2020 1149   BILITOT 0.2 (L) 08/25/2020 1149           Impression and Plan:  1.   Lung cancer diagnosed in 2020 with localized disease.  She developed stage IV adenocarcinoma with intrathoracic progression.  Her disease status was updated at this time and treatment options were reviewed.  Risks and benefits of proceeding with treatment today were discussed.  Complications that include nausea, fatigue, myelosuppression and infusion related complaints were discussed.  The plan is to proceed with cycle 3 and 4 of therapy before proceeding with staging scans and maintenance therapy after that.  After discussion today, I will eliminate carboplatin which is responsible for the majority of her symptoms at this time and we will proceed with Alimta and Pembrolizumab for the next 2 cycles prior to obtaining imaging studies.       2.  Goals of care and prognosis: Therapy remains palliative although aggressive measures are warranted at this time.     3.  Cough: Related to her disease progression and symptoms improved at this time.  4.  IV access: Peripheral veins remains in use without any complications.  5.  Antiemetics: No nausea or vomiting reported at this time.  Compazine is available to her.  6.  Arthralgias and  myalgias: Improved at this time but still an issue after each cycle of chemotherapy.  7. Vitamin deficiency: She will continue vitamin B12 injection as well as currently on folic acid replacement.   8.  Follow-up: She will return in 3 weeks for the next cycle of therapy.      30  minutes were spent on this encounter.  The time was dedicated to reviewing laboratory data, disease status update and addressing complications noted to her cancer and cancer therapy.  Zola Button, MD 10/06/2020 12:27 PM

## 2020-11-03 MED FILL — Fosaprepitant Dimeglumine For IV Infusion 150 MG (Base Eq): INTRAVENOUS | Qty: 5 | Status: AC

## 2020-11-03 MED FILL — Dexamethasone Sodium Phosphate Inj 100 MG/10ML: INTRAMUSCULAR | Qty: 1 | Status: AC

## 2020-11-04 ENCOUNTER — Inpatient Hospital Stay: Payer: Medicaid Other | Admitting: Oncology

## 2020-11-04 ENCOUNTER — Inpatient Hospital Stay: Payer: Medicaid Other

## 2020-11-04 ENCOUNTER — Telehealth: Payer: Self-pay | Admitting: *Deleted

## 2020-11-04 NOTE — Telephone Encounter (Signed)
PC to patient regarding missed appointments, she called the on-call nurse this morning to cancel because she is sick - has sore throat, congestion, cough & fever.  Advised patient to get covid test, she verbalizes understanding.  Dr. Alen Blew informed, scheduling message sent.

## 2020-11-09 ENCOUNTER — Telehealth: Payer: Self-pay | Admitting: Oncology

## 2020-11-09 NOTE — Telephone Encounter (Signed)
Scheduled appointment per 07/18 sch msg. Left message.

## 2020-12-04 ENCOUNTER — Other Ambulatory Visit: Payer: Medicaid Other

## 2020-12-04 ENCOUNTER — Ambulatory Visit: Payer: Medicaid Other | Admitting: Physician Assistant

## 2020-12-04 ENCOUNTER — Ambulatory Visit: Payer: Medicaid Other

## 2020-12-09 ENCOUNTER — Ambulatory Visit: Payer: Medicaid Other

## 2020-12-09 ENCOUNTER — Other Ambulatory Visit: Payer: Medicaid Other

## 2020-12-09 ENCOUNTER — Ambulatory Visit: Payer: Medicaid Other | Admitting: Oncology

## 2020-12-10 ENCOUNTER — Other Ambulatory Visit: Payer: Self-pay | Admitting: Oncology

## 2020-12-10 ENCOUNTER — Telehealth: Payer: Self-pay

## 2020-12-10 DIAGNOSIS — C3490 Malignant neoplasm of unspecified part of unspecified bronchus or lung: Secondary | ICD-10-CM

## 2020-12-10 NOTE — Telephone Encounter (Signed)
Contacted patient and made aware of plan to get scans the week of the 23rd prior to follow up with MD on 8/30. Patient is aware to contact the office if she dose not hear by the 23rd with an appt for the scan. Patient had no other questions or concerns.

## 2020-12-10 NOTE — Telephone Encounter (Signed)
-----   Message from Wyatt Portela, MD sent at 12/10/2020 11:43 AM EDT ----- Regarding: RE: missed treatment appts I will arrange for a scan the week of 8/23 and will discuss on her visit on 8/30. Thanks ----- Message ----- From: Scot Dock, RN Sent: 12/10/2020  11:36 AM EDT To: Wyatt Portela, MD Subject: missed treatment appts                         She would like to know if it would be best for her to proceed with staging scans prior to moving forward with her last scheduled tx on 8/30 ([id number]) since she has missed so many appts. Last treatment was on 6/14. Please advise. Thanks

## 2020-12-21 ENCOUNTER — Other Ambulatory Visit: Payer: Self-pay

## 2020-12-21 ENCOUNTER — Inpatient Hospital Stay: Payer: Medicaid Other | Attending: Oncology

## 2020-12-21 ENCOUNTER — Ambulatory Visit (HOSPITAL_COMMUNITY)
Admission: RE | Admit: 2020-12-21 | Discharge: 2020-12-21 | Disposition: A | Discharge status: Home / Self Care | Payer: Medicaid Other | Source: Ambulatory Visit | Attending: Oncology | Admitting: Oncology

## 2020-12-21 ENCOUNTER — Telehealth: Payer: Self-pay | Admitting: *Deleted

## 2020-12-21 DIAGNOSIS — M791 Myalgia, unspecified site: Secondary | ICD-10-CM | POA: Diagnosis not present

## 2020-12-21 DIAGNOSIS — E559 Vitamin D deficiency, unspecified: Secondary | ICD-10-CM | POA: Diagnosis not present

## 2020-12-21 DIAGNOSIS — M2550 Pain in unspecified joint: Secondary | ICD-10-CM | POA: Insufficient documentation

## 2020-12-21 DIAGNOSIS — E538 Deficiency of other specified B group vitamins: Secondary | ICD-10-CM | POA: Diagnosis not present

## 2020-12-21 DIAGNOSIS — Z5112 Encounter for antineoplastic immunotherapy: Secondary | ICD-10-CM | POA: Insufficient documentation

## 2020-12-21 DIAGNOSIS — C3490 Malignant neoplasm of unspecified part of unspecified bronchus or lung: Secondary | ICD-10-CM | POA: Insufficient documentation

## 2020-12-21 DIAGNOSIS — Z79899 Other long term (current) drug therapy: Secondary | ICD-10-CM | POA: Diagnosis not present

## 2020-12-21 DIAGNOSIS — Z923 Personal history of irradiation: Secondary | ICD-10-CM | POA: Insufficient documentation

## 2020-12-21 DIAGNOSIS — Z9221 Personal history of antineoplastic chemotherapy: Secondary | ICD-10-CM | POA: Insufficient documentation

## 2020-12-21 DIAGNOSIS — C3431 Malignant neoplasm of lower lobe, right bronchus or lung: Secondary | ICD-10-CM

## 2020-12-21 DIAGNOSIS — R110 Nausea: Secondary | ICD-10-CM | POA: Insufficient documentation

## 2020-12-21 LAB — CBC WITH DIFFERENTIAL (CANCER CENTER ONLY)
Abs Immature Granulocytes: 0.01 10*3/uL (ref 0.00–0.07)
Basophils Absolute: 0 10*3/uL (ref 0.0–0.1)
Basophils Relative: 0 %
Eosinophils Absolute: 0 10*3/uL (ref 0.0–0.5)
Eosinophils Relative: 1 %
HCT: 41.8 % (ref 36.0–46.0)
Hemoglobin: 14.1 g/dL (ref 12.0–15.0)
Immature Granulocytes: 0 %
Lymphocytes Relative: 12 %
Lymphs Abs: 0.8 10*3/uL (ref 0.7–4.0)
MCH: 31.8 pg (ref 26.0–34.0)
MCHC: 33.7 g/dL (ref 30.0–36.0)
MCV: 94.1 fL (ref 80.0–100.0)
Monocytes Absolute: 0.6 10*3/uL (ref 0.1–1.0)
Monocytes Relative: 8 %
Neutro Abs: 5.6 10*3/uL (ref 1.7–7.7)
Neutrophils Relative %: 79 %
Platelet Count: 244 10*3/uL (ref 150–400)
RBC: 4.44 MIL/uL (ref 3.87–5.11)
RDW: 12.8 % (ref 11.5–15.5)
WBC Count: 7.1 10*3/uL (ref 4.0–10.5)
nRBC: 0 % (ref 0.0–0.2)

## 2020-12-21 LAB — CMP (CANCER CENTER ONLY)
ALT: 12 U/L (ref 0–44)
AST: 17 U/L (ref 15–41)
Albumin: 3.6 g/dL (ref 3.5–5.0)
Alkaline Phosphatase: 87 U/L (ref 38–126)
Anion gap: 10 (ref 5–15)
BUN: 16 mg/dL (ref 8–23)
CO2: 26 mmol/L (ref 22–32)
Calcium: 9.3 mg/dL (ref 8.9–10.3)
Chloride: 102 mmol/L (ref 98–111)
Creatinine: 0.86 mg/dL (ref 0.44–1.00)
GFR, Estimated: >60 mL/min (ref >60)
Glucose, Bld: 107 mg/dL — ABNORMAL HIGH (ref 70–99)
Potassium: 4.1 mmol/L (ref 3.5–5.1)
Sodium: 138 mmol/L (ref 135–145)
Total Bilirubin: 0.3 mg/dL (ref 0.3–1.2)
Total Protein: 7.4 g/dL (ref 6.5–8.1)

## 2020-12-21 LAB — TSH: TSH: 0.93 u[IU]/mL (ref 0.308–3.960)

## 2020-12-21 MED ORDER — IOHEXOL 350 MG/ML SOLN
80.0000 mL | Freq: Once | INTRAVENOUS | Status: AC | PRN
  Administered 2020-12-21: 80 mL via INTRAVENOUS

## 2020-12-21 NOTE — Telephone Encounter (Signed)
PC to patient, she has CT at [facility] at 12:30, she has lab appointment tomorrow, however, CT needs labs today.  Asked patient if she can come early today to have labs drawn instead of tomorrow.  Patient states she can be here at 11:30-11:45, lab appointment made.

## 2020-12-22 ENCOUNTER — Other Ambulatory Visit: Payer: Self-pay | Admitting: Oncology

## 2020-12-22 ENCOUNTER — Other Ambulatory Visit: Payer: Medicaid Other

## 2020-12-22 ENCOUNTER — Inpatient Hospital Stay: Payer: Medicaid Other

## 2020-12-22 ENCOUNTER — Inpatient Hospital Stay (HOSPITAL_BASED_OUTPATIENT_CLINIC_OR_DEPARTMENT_OTHER): Payer: Medicaid Other | Admitting: Physician Assistant

## 2020-12-22 VITALS — BP 133/92 | HR 97 | Temp 98.8°F | Resp 18 | Ht 66.0 in | Wt 132.6 lb

## 2020-12-22 DIAGNOSIS — C3431 Malignant neoplasm of lower lobe, right bronchus or lung: Secondary | ICD-10-CM

## 2020-12-22 DIAGNOSIS — Z5112 Encounter for antineoplastic immunotherapy: Secondary | ICD-10-CM | POA: Diagnosis not present

## 2020-12-22 DIAGNOSIS — C3490 Malignant neoplasm of unspecified part of unspecified bronchus or lung: Secondary | ICD-10-CM

## 2020-12-22 MED ORDER — SODIUM CHLORIDE 0.9 % IV SOLN
400.0000 mg | Freq: Once | INTRAVENOUS | Status: AC
  Administered 2020-12-22: 400 mg via INTRAVENOUS
  Filled 2020-12-22: qty 16

## 2020-12-22 MED ORDER — SODIUM CHLORIDE 0.9 % IV SOLN: Freq: Once | INTRAVENOUS | Status: AC

## 2020-12-22 NOTE — Progress Notes (Signed)
Hematology and Oncology Follow Up   Lauren Salazar [id number] 09-Mar-1961 60 y.o. 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  Lauren Salazar presented with T4N2 disease in 2020.  Prior Therapy:  Lauren Salazar is status post robotic assisted laparoscopic cystectomy and pelvic exoneration including hysterectomy, bilateral salpingo-oophorectomy and ileoconduit diversion completed on February 01, 2019.   Radiation therapy with weekly chemotherapy utilizing carboplatin and paclitaxel.  Week 1 of chemotherapy started on May 14, 2019.  Therapy concluded in February 2021.    Durvalumab 10 mg/kg cycle 1 on Sep 03, 2019 every 14 days.  Lauren Salazar completed 4 cycles of therapy.    Current therapy: Carboplatin with Alimta and Pembrolizumab started on August 04, 2020.  Carboplatin was discontinued on October 06, 2020 with Cycle 3.  Lauren Salazar is here for cycle 4 of therapy.  Interim History: Lauren Salazar is here for a follow-up visit.  Lauren Salazar reports that her fatigue did improve by 20-25% after discontinuing carboplatin.  Lauren Salazar did feel well enough this past week to return to work. Her appetite is fair without any noticeable weight changes. Lauren Salazar has occasional episodes of nausea without vomiting. Lauren Salazar denies any abdominal pain and her bowel movements are unchanged. Patient reports a painful skin lesion on her right ear that has grown in the past 3-4 months. Lauren Salazar does not see a dermatologist routinely. Lauren Salazar denies any fevers, chills, night sweats, shortness of breath, chest pain or cough. Lauren Salazar has no other complaints.   Medications: Unchanged on review. Current Outpatient Medications  Medication Sig Dispense Refill   folic acid (FOLVITE) 1 MG tablet Take 1 tablet (1 mg total) by mouth daily. 90 tablet 3   prochlorperazine (COMPAZINE) 10 MG tablet Take 1 tablet (10 mg total) by mouth every 6  (six) hours as needed for nausea or vomiting. 30 tablet 0   acetaminophen (TYLENOL) 500 MG tablet Take 1,000 mg by mouth every 8 (eight) hours as needed (pain).  (Patient not taking: Reported on 12/22/2020)     azithromycin (ZITHROMAX Z-PAK) 250 MG tablet Take two on day one. Take one daily after that for 4 days. (Patient not taking: Reported on 12/22/2020) 6 each 0   benzonatate (TESSALON) 200 MG capsule TAKE 1 CAPSULE(200 MG) BY MOUTH THREE TIMES DAILY AS NEEDED FOR COUGH (Patient not taking: Reported on 12/22/2020) 30 capsule 2   chlorpheniramine-HYDROcodone (TUSSIONEX) 10-8 MG/5ML SUER Take 5 mLs by mouth every 12 (twelve) hours as needed for cough. (Patient not taking: Reported on 12/22/2020) 140 mL 0   doxycycline (VIBRA-TABS) 100 MG tablet Take 1 tablet (100 mg total) by mouth 2 (two) times daily. (Patient not taking: Reported on 12/22/2020) 10 tablet 0   gabapentin (NEURONTIN) 300 MG capsule Take 1 capsule (300 mg total) by mouth 3 (three) times daily. Take for significant back pain around the clock. (Patient not taking: Reported on 12/22/2020) 42 capsule 1   guaiFENesin-dextromethorphan (ROBITUSSIN DM) 100-10 MG/5ML syrup Take 5-10 mLs by mouth every 4 (four) hours as needed for cough. (Patient not taking: Reported on 12/22/2020) 473 mL 0   HYDROcodone-acetaminophen (NORCO) 5-325 MG tablet Take 1 tablet by mouth every 6 (six) hours as needed for moderate pain. (Patient not taking: Reported on 12/22/2020) 30 tablet 0   nicotine (NICODERM CQ - DOSED IN MG/24 HOURS) 21 mg/24hr patch Place 21 mg onto the skin daily. (Patient not  taking: Reported on 12/22/2020)     sucralfate (CARAFATE) 1 g tablet Dissolve 1 tablet in 10 mL H20 and swallow 30 min prior to meals and bedtime PRN heartburn. (Patient not taking: Reported on 12/22/2020) 40 tablet 2   traMADol (ULTRAM) 50 MG tablet Take 1 tablet (50 mg total) by mouth every 6 (six) hours as needed. (Patient not taking: Reported on 12/22/2020) 30 tablet 0   No  current facility-administered medications for this visit.     Allergies: No Known Allergies   Physical exam  Blood pressure (!) 133/92, pulse 97, temperature 98.8 F (37.1 C), temperature source Oral, resp. rate 18, height 5\' 6"  (1.676 m), weight 132 lb 9.6 oz (60.1 kg), SpO2 98 %.   ECOG 1  General appearance: Alert, awake without any distress. Head: Atraumatic without abnormalities Oropharynx: Without any thrush or ulcers. Eyes: No scleral icterus. Lymph nodes: No lymphadenopathy noted in the cervical, supraclavicular, or axillary nodes Heart:regular rate and rhythm, without any murmurs or gallops.   Lung: Clear to auscultation without any rhonchi, wheezes or dullness to percussion. Abdomin: Soft, nontender without any shifting dullness or ascites. Musculoskeletal: No clubbing or cyanosis. Neurological: No motor or sensory deficits. Skin: erythematous lesion that is tender over right external ear. No discharge.      Lab Results: Lab Results  Component Value Date   WBC 7.1 12/21/2020   HGB 14.1 12/21/2020   HCT 41.8 12/21/2020   MCV 94.1 12/21/2020   PLT 244 12/21/2020     Chemistry      Component Value Date/Time   NA 138 12/21/2020 1209   K 4.1 12/21/2020 1209   CL 102 12/21/2020 1209   CO2 26 12/21/2020 1209   BUN 16 12/21/2020 1209   CREATININE 0.86 12/21/2020 1209      Component Value Date/Time   CALCIUM 9.3 12/21/2020 1209   ALKPHOS 87 12/21/2020 1209   AST 17 12/21/2020 1209   ALT 12 12/21/2020 1209   BILITOT 0.3 12/21/2020 1209           Impression and Plan:  1.   Lung cancer diagnosed in 2020 with localized disease.  Lauren Salazar developed stage IV adenocarcinoma with intrathoracic progression.  Patient presents today, 12/22/2020, for Cycle 4 of therapy. Reviewed labs from yesterday that require no intervention. CT imaging from yesterday show mild progression of the right lower lobe mass. No new lesions or other sites of progression. Mild growth could  be secondary to gap in treatment. Additionally, patient has only received 3 cycles of treatment. Patient finds it difficult to continue current treatment as Lauren Salazar is unable to address other medical concerns including dental work and hernia repair. Recommendation is to proceed with maintenance therapy with single agent pembrolizumab once every 6 weeks. We can hold treatment periodically as needed but Lauren Salazar will need to be on treatment long term. Patient expressed understanding. Lauren Salazar will continue today with single agent pembrolizumab and return in 6 weeks for another cycle. We will plan to obtain repeat imaging in 3 months.    2.  Goals of care and prognosis: Therapy remains palliative although aggressive measures are warranted at this time.   3.  Cough: Related to her disease progression and symptoms improved at this time.  4.  IV access: Peripheral veins remains in use without any complications.  5.  Antiemetics: No nausea or vomiting reported at this time.  Compazine is available to her.  6.  Arthralgias and myalgias: Improved at this time but still an  issue after each cycle of chemotherapy.  7. Vitamin deficiency: Lauren Salazar will continue vitamin B12 injection as well as currently on folic acid replacement.  8. Right ear skin lesion: Lauren Salazar will follow up with dermatologist for further evaluation including possible biopsy.     9. Follow-up: Lauren Salazar will return in 6 weeks for the next cycle of therapy.  I have spent a total of 30 minutes minutes of face-to-face and non-face-to-face time, preparing to see the patient,  performing a medically appropriate examination, counseling and educating the patient, ordering medications, documenting clinical information in the electronic health record, and care coordination.    Lincoln Brigham, PA-C 12/22/2020 1:30 PM Hematology and [place] at [facility]  Patient was seen with Dr. Alen Blew.    Oncology Addendum  Patient seen and examined  personally.  Lauren Salazar feels well after treatment break.  Lauren Salazar denies any fevers chills or sweats.  Her performance status quality of life remains improved. On physical examination Lauren Salazar was awake alert.  Not distressed.  Lauren Salazar did have some mild erythema noted on her left ear.   CT scan obtained on December 21, 2020 was personally reviewed and discussed with the patient.  Lauren Salazar does have overall stable disease with mild progression.  The natural course of this disease was reviewed today in detail and treatment options were discussed.  He is reluctant to resume systemic chemotherapy or any other treatment at this time.  He is agreeable to maintenance immunotherapy at this time.  Lauren Salazar is valuing her quality of life at this time especially off chemotherapy.   After discussion, we have elected to proceed with Pembrolizumab 400 mg every 6 weeks and will repeat imaging studies in 3 months.

## 2020-12-22 NOTE — Patient Instructions (Signed)
[facility]  Discharge Instructions: Thank you for choosing [facility] to provide your oncology and hematology care.   If you have a lab appointment with the [facility], please go directly to the [facility] and check in at the registration area.   Wear comfortable clothing and clothing appropriate for easy access to any Portacath or PICC line.   We strive to give you quality time with your provider. You may need to reschedule your appointment if you arrive late (15 or more minutes).  Arriving late affects you and other patients whose appointments are after yours.  Also, if you miss three or more appointments without notifying the office, you may be dismissed from the clinic at the provider's discretion.      For prescription refill requests, have your pharmacy contact our office and allow 72 hours for refills to be completed.    Today you received the following chemotherapy and/or immunotherapy agents [name]      To help prevent nausea and vomiting after your treatment, we encourage you to take your nausea medication as directed.  BELOW ARE SYMPTOMS THAT SHOULD BE REPORTED IMMEDIATELY: *FEVER GREATER THAN 100.4 F (38 C) OR HIGHER *CHILLS OR SWEATING *NAUSEA AND VOMITING THAT IS NOT CONTROLLED WITH YOUR NAUSEA MEDICATION *UNUSUAL SHORTNESS OF BREATH *UNUSUAL BRUISING OR BLEEDING *URINARY PROBLEMS (pain or burning when urinating, or frequent urination) *BOWEL PROBLEMS (unusual diarrhea, constipation, pain near the anus) TENDERNESS IN MOUTH AND THROAT WITH OR WITHOUT PRESENCE OF ULCERS (sore throat, sores in mouth, or a toothache) UNUSUAL RASH, SWELLING OR PAIN  UNUSUAL VAGINAL DISCHARGE OR ITCHING   Items with * indicate a potential emergency and should be followed up as soon as possible or go to the Emergency Department if any problems should occur.  Please show the CHEMOTHERAPY ALERT CARD or IMMUNOTHERAPY ALERT CARD at check-in to  the Emergency Department and triage nurse.  Should you have questions after your visit or need to cancel or reschedule your appointment, please contact [facility]  Dept: [phone]  and follow the prompts.  Office hours are 8:00 a.m. to 4:30 p.m. Monday - Friday. Please note that voicemails left after 4:00 p.m. may not be returned until the following business day.  We are closed weekends and major holidays. You have access to a nurse at all times for urgent questions. Please call the main number to the clinic Dept: [phone] and follow the prompts.   For any non-urgent questions, you may also contact your provider using MyChart. We now offer e-Visits for anyone 62 and older to request care online for non-urgent symptoms. For details visit [email or web address].   Also download the MyChart app! Go to the app store, search "MyChart", open the app, select [facility], and log in with your MyChart username and password.  Due to Covid, a mask is required upon entering the hospital/clinic. If you do not have a mask, one will be given to you upon arrival. For doctor visits, patients may have 1 support person aged 62 or older with them. For treatment visits, patients cannot have anyone with them due to current Covid guidelines and our immunocompromised population.

## 2021-02-09 ENCOUNTER — Encounter: Payer: Self-pay | Admitting: *Deleted

## 2021-02-10 ENCOUNTER — Other Ambulatory Visit: Payer: Medicaid Other

## 2021-02-10 ENCOUNTER — Ambulatory Visit: Payer: Medicaid Other | Admitting: Oncology

## 2021-02-11 ENCOUNTER — Ambulatory Visit: Payer: Medicaid Other

## 2021-02-15 ENCOUNTER — Inpatient Hospital Stay: Payer: Medicaid Other

## 2021-02-15 ENCOUNTER — Inpatient Hospital Stay (HOSPITAL_BASED_OUTPATIENT_CLINIC_OR_DEPARTMENT_OTHER): Payer: Medicaid Other | Admitting: Oncology

## 2021-02-15 ENCOUNTER — Inpatient Hospital Stay: Payer: Medicaid Other | Attending: Oncology

## 2021-02-15 ENCOUNTER — Other Ambulatory Visit: Payer: Self-pay

## 2021-02-15 VITALS — BP 134/82 | HR 76 | Temp 98.5°F | Resp 17 | Ht 66.0 in | Wt 132.6 lb

## 2021-02-15 DIAGNOSIS — Z90722 Acquired absence of ovaries, bilateral: Secondary | ICD-10-CM | POA: Diagnosis not present

## 2021-02-15 DIAGNOSIS — Z9221 Personal history of antineoplastic chemotherapy: Secondary | ICD-10-CM | POA: Insufficient documentation

## 2021-02-15 DIAGNOSIS — C3431 Malignant neoplasm of lower lobe, right bronchus or lung: Secondary | ICD-10-CM

## 2021-02-15 DIAGNOSIS — Z79899 Other long term (current) drug therapy: Secondary | ICD-10-CM | POA: Insufficient documentation

## 2021-02-15 DIAGNOSIS — C3490 Malignant neoplasm of unspecified part of unspecified bronchus or lung: Secondary | ICD-10-CM | POA: Insufficient documentation

## 2021-02-15 DIAGNOSIS — Z5112 Encounter for antineoplastic immunotherapy: Secondary | ICD-10-CM | POA: Insufficient documentation

## 2021-02-15 DIAGNOSIS — Z90710 Acquired absence of both cervix and uterus: Secondary | ICD-10-CM | POA: Insufficient documentation

## 2021-02-15 DIAGNOSIS — Z923 Personal history of irradiation: Secondary | ICD-10-CM | POA: Insufficient documentation

## 2021-02-15 LAB — CBC WITH DIFFERENTIAL (CANCER CENTER ONLY)
Abs Immature Granulocytes: 0.03 10*3/uL (ref 0.00–0.07)
Basophils Absolute: 0 10*3/uL (ref 0.0–0.1)
Basophils Relative: 1 %
Eosinophils Absolute: 0.2 10*3/uL (ref 0.0–0.5)
Eosinophils Relative: 2 %
HCT: 39.8 % (ref 36.0–46.0)
Hemoglobin: 13.4 g/dL (ref 12.0–15.0)
Immature Granulocytes: 0 %
Lymphocytes Relative: 10 %
Lymphs Abs: 0.9 10*3/uL (ref 0.7–4.0)
MCH: 30.5 pg (ref 26.0–34.0)
MCHC: 33.7 g/dL (ref 30.0–36.0)
MCV: 90.5 fL (ref 80.0–100.0)
Monocytes Absolute: 0.8 10*3/uL (ref 0.1–1.0)
Monocytes Relative: 9 %
Neutro Abs: 6.6 10*3/uL (ref 1.7–7.7)
Neutrophils Relative %: 78 %
Platelet Count: 266 10*3/uL (ref 150–400)
RBC: 4.4 MIL/uL (ref 3.87–5.11)
RDW: 13.9 % (ref 11.5–15.5)
WBC Count: 8.4 10*3/uL (ref 4.0–10.5)
nRBC: 0 % (ref 0.0–0.2)

## 2021-02-15 LAB — CMP (CANCER CENTER ONLY)
ALT: 15 U/L (ref 0–44)
AST: 20 U/L (ref 15–41)
Albumin: 3.6 g/dL (ref 3.5–5.0)
Alkaline Phosphatase: 84 U/L (ref 38–126)
Anion gap: 8 (ref 5–15)
BUN: 18 mg/dL (ref 8–23)
CO2: 24 mmol/L (ref 22–32)
Calcium: 8.8 mg/dL — ABNORMAL LOW (ref 8.9–10.3)
Chloride: 104 mmol/L (ref 98–111)
Creatinine: 0.84 mg/dL (ref 0.44–1.00)
GFR, Estimated: >60 mL/min (ref >60)
Glucose, Bld: 120 mg/dL — ABNORMAL HIGH (ref 70–99)
Potassium: 4 mmol/L (ref 3.5–5.1)
Sodium: 136 mmol/L (ref 135–145)
Total Bilirubin: 0.3 mg/dL (ref 0.3–1.2)
Total Protein: 7.2 g/dL (ref 6.5–8.1)

## 2021-02-15 LAB — TSH: TSH: 2.254 u[IU]/mL (ref 0.350–4.500)

## 2021-02-15 MED ORDER — PROCHLORPERAZINE MALEATE 10 MG PO TABS
10.0000 mg | ORAL_TABLET | Freq: Once | ORAL | Status: AC
  Administered 2021-02-15: 10 mg via ORAL
  Filled 2021-02-15: qty 1

## 2021-02-15 MED ORDER — SODIUM CHLORIDE 0.9 % IV SOLN: Freq: Once | INTRAVENOUS | Status: AC

## 2021-02-15 MED ORDER — SODIUM CHLORIDE 0.9 % IV SOLN
400.0000 mg | Freq: Once | INTRAVENOUS | Status: AC
  Administered 2021-02-15: 400 mg via INTRAVENOUS
  Filled 2021-02-15: qty 16

## 2021-02-15 NOTE — Progress Notes (Signed)
Hematology and Oncology Follow Up   Lauren Salazar [id number] Jul 12, 1960 60 y.o. 02/15/2021 10:10 AM College, [facility] @ [employer], [place] [facility]       Principle Diagnosis: 60 year old woman with T4N2 adenocarcinoma of the lung diagnosed in 2021.  She developed stage IV in April 2022.  Prior Therapy:  She is status post robotic assisted laparoscopic cystectomy and pelvic exoneration including hysterectomy, bilateral salpingo-oophorectomy and ileoconduit diversion completed on February 01, 2019.   Radiation therapy with weekly chemotherapy utilizing carboplatin and paclitaxel.  Week 1 of chemotherapy started on May 14, 2019.  Therapy concluded in February 2021.    Durvalumab 10 mg/kg cycle 1 on Sep 03, 2019 every 14 days.  She completed 4 cycles of therapy.   Carboplatin with Alimta and Pembrolizumab started on August 04, 2020.  She completed 3 cycles of therapy in June 2022.   Current therapy: Pembrolizumab 400 mg every 6 weeks started on December 22, 2020.  Interim History: Lauren Salazar returns today for a follow-up visit.  Since her last visit, she reports no major changes in her health.  She has tolerated Pembrolizumab without any major complaints.  She denies any nausea, vomiting or abdominal pain.  She denies any shortness of breath or difficulty breathing.  She denies any hospitalizations or illnesses.  She is eating better and gained weight and continues to attempt activities of daily living.     Medications: Updated on review. Current Outpatient Medications  Medication Sig Dispense Refill   acetaminophen (TYLENOL) 500 MG tablet Take 1,000 mg by mouth every 8 (eight) hours as needed (pain).  (Patient not taking: Reported on 12/22/2020)     azithromycin (ZITHROMAX Z-PAK) 250 MG tablet Take two on day one. Take one daily after that for 4 days. (Patient not taking: Reported on 12/22/2020) 6 each 0   benzonatate (TESSALON) 200 MG capsule TAKE 1  CAPSULE(200 MG) BY MOUTH THREE TIMES DAILY AS NEEDED FOR COUGH (Patient not taking: Reported on 12/22/2020) 30 capsule 2   chlorpheniramine-HYDROcodone (TUSSIONEX) 10-8 MG/5ML SUER Take 5 mLs by mouth every 12 (twelve) hours as needed for cough. (Patient not taking: Reported on 12/22/2020) 140 mL 0   doxycycline (VIBRA-TABS) 100 MG tablet Take 1 tablet (100 mg total) by mouth 2 (two) times daily. (Patient not taking: Reported on 12/22/2020) 10 tablet 0   folic acid (FOLVITE) 1 MG tablet Take 1 tablet (1 mg total) by mouth daily. 90 tablet 3   gabapentin (NEURONTIN) 300 MG capsule Take 1 capsule (300 mg total) by mouth 3 (three) times daily. Take for significant back pain around the clock. (Patient not taking: Reported on 12/22/2020) 42 capsule 1   guaiFENesin-dextromethorphan (ROBITUSSIN DM) 100-10 MG/5ML syrup Take 5-10 mLs by mouth every 4 (four) hours as needed for cough. (Patient not taking: Reported on 12/22/2020) 473 mL 0   HYDROcodone-acetaminophen (NORCO) 5-325 MG tablet Take 1 tablet by mouth every 6 (six) hours as needed for moderate pain. (Patient not taking: Reported on 12/22/2020) 30 tablet 0   nicotine (NICODERM CQ - DOSED IN MG/24 HOURS) 21 mg/24hr patch Place 21 mg onto the skin daily. (Patient not taking: Reported on 12/22/2020)     prochlorperazine (COMPAZINE) 10 MG tablet Take 1 tablet (10 mg total) by mouth every 6 (six) hours as needed for nausea or vomiting. 30 tablet 0   sucralfate (CARAFATE) 1 g tablet Dissolve 1 tablet in 10 mL H20 and swallow 30 min prior to meals and bedtime PRN heartburn. (Patient  not taking: Reported on 12/22/2020) 40 tablet 2   traMADol (ULTRAM) 50 MG tablet Take 1 tablet (50 mg total) by mouth every 6 (six) hours as needed. (Patient not taking: Reported on 12/22/2020) 30 tablet 0   No current facility-administered medications for this visit.     Allergies: No Known Allergies   Physical exam  Blood pressure 134/82, pulse 76, temperature 98.5 F (36.9 C),  temperature source Oral, resp. rate 17, height 5\' 6"  (1.676 m), weight 132 lb 9.6 oz (60.1 kg), SpO2 100 %.    ECOG 1    General appearance: Comfortable appearing without any discomfort Head: Normocephalic without any trauma Oropharynx: Mucous membranes are moist and pink without any thrush or ulcers. Eyes: Pupils are equal and round reactive to light. Lymph nodes: No cervical, supraclavicular, inguinal or axillary lymphadenopathy.   Heart:regular rate and rhythm.  S1 and S2 without leg edema. Lung: Clear without any rhonchi or wheezes.  No dullness to percussion. Abdomin: Soft, nontender, nondistended with good bowel sounds.  No hepatosplenomegaly. Musculoskeletal: No joint deformity or effusion.  Full range of motion noted. Neurological: No deficits noted on motor, sensory and deep tendon reflex exam. Skin: No petechial rash or dryness.  Appeared moist.       Lab Results: Lab Results  Component Value Date   WBC 7.1 12/21/2020   HGB 14.1 12/21/2020   HCT 41.8 12/21/2020   MCV 94.1 12/21/2020   PLT 244 12/21/2020     Chemistry      Component Value Date/Time   NA 138 12/21/2020 1209   K 4.1 12/21/2020 1209   CL 102 12/21/2020 1209   CO2 26 12/21/2020 1209   BUN 16 12/21/2020 1209   CREATININE 0.86 12/21/2020 1209      Component Value Date/Time   CALCIUM 9.3 12/21/2020 1209   ALKPHOS 87 12/21/2020 1209   AST 17 12/21/2020 1209   ALT 12 12/21/2020 1209   BILITOT 0.3 12/21/2020 1209           Impression and Plan:  1.   Stage IV adenocarcinoma of the lung with intrathoracic progression noted in 2022.   The natural course of this disease was discussed at this time.  Treatment choices were reiterated.  She is currently on Pembrolizumab without any additional treatment based on her wishes.  She likes the idea of every 6 weeks as she has missed multiple appointments with every 3-week scheduling.  She will proceed with Pembrolizumab today and repeated in 6 weeks and  we will assess her imaging studies in January 2023.     2.  Goals of care and prognosis: Her disease is incurable although aggressive measures are warranted given her excellent performance status.     3.  Cough: Resolved at this time.  This appears to be multifactorial related to malignancy and possible infection.  4.  IV access: No issues with peripheral veins at this time.  He opted against a Port-A-Cath.  5.  Antiemetics: Compazine is available to her without any nausea or vomiting.   6.  Follow-up: In 6 weeks for a follow-up visit.      30  minutes were spent on this visit.  Time was dedicated to reviewing laboratory data, disease status update and outlining future plan of care.  Zola Button, MD 02/15/2021 10:10 AM

## 2021-02-23 ENCOUNTER — Ambulatory Visit: Payer: Medicaid Other

## 2021-02-23 ENCOUNTER — Other Ambulatory Visit: Payer: Medicaid Other

## 2021-03-29 ENCOUNTER — Other Ambulatory Visit: Payer: Medicaid Other

## 2021-03-29 ENCOUNTER — Ambulatory Visit: Payer: Medicaid Other

## 2021-03-29 ENCOUNTER — Ambulatory Visit: Payer: Medicaid Other | Admitting: Oncology

## 2021-05-04 ENCOUNTER — Other Ambulatory Visit: Payer: Self-pay

## 2021-05-04 ENCOUNTER — Inpatient Hospital Stay: Payer: Medicaid Other

## 2021-05-04 ENCOUNTER — Inpatient Hospital Stay: Payer: Medicaid Other | Attending: Oncology

## 2021-05-04 ENCOUNTER — Inpatient Hospital Stay (HOSPITAL_BASED_OUTPATIENT_CLINIC_OR_DEPARTMENT_OTHER): Payer: Medicaid Other | Admitting: Oncology

## 2021-05-04 VITALS — BP 128/85 | HR 97 | Temp 97.6°F | Resp 17 | Ht 66.0 in | Wt 134.3 lb

## 2021-05-04 DIAGNOSIS — C3490 Malignant neoplasm of unspecified part of unspecified bronchus or lung: Secondary | ICD-10-CM

## 2021-05-04 DIAGNOSIS — Z79899 Other long term (current) drug therapy: Secondary | ICD-10-CM | POA: Diagnosis not present

## 2021-05-04 DIAGNOSIS — Z5112 Encounter for antineoplastic immunotherapy: Secondary | ICD-10-CM | POA: Diagnosis not present

## 2021-05-04 DIAGNOSIS — C3431 Malignant neoplasm of lower lobe, right bronchus or lung: Secondary | ICD-10-CM

## 2021-05-04 DIAGNOSIS — E079 Disorder of thyroid, unspecified: Secondary | ICD-10-CM | POA: Insufficient documentation

## 2021-05-04 LAB — CBC WITH DIFFERENTIAL (CANCER CENTER ONLY)
Abs Immature Granulocytes: 0.02 10*3/uL (ref 0.00–0.07)
Basophils Absolute: 0 10*3/uL (ref 0.0–0.1)
Basophils Relative: 1 %
Eosinophils Absolute: 0.1 10*3/uL (ref 0.0–0.5)
Eosinophils Relative: 2 %
HCT: 40.5 % (ref 36.0–46.0)
Hemoglobin: 13.6 g/dL (ref 12.0–15.0)
Immature Granulocytes: 0 %
Lymphocytes Relative: 13 %
Lymphs Abs: 1 10*3/uL (ref 0.7–4.0)
MCH: 30.2 pg (ref 26.0–34.0)
MCHC: 33.6 g/dL (ref 30.0–36.0)
MCV: 90 fL (ref 80.0–100.0)
Monocytes Absolute: 0.5 10*3/uL (ref 0.1–1.0)
Monocytes Relative: 6 %
Neutro Abs: 5.9 10*3/uL (ref 1.7–7.7)
Neutrophils Relative %: 78 %
Platelet Count: 250 10*3/uL (ref 150–400)
RBC: 4.5 MIL/uL (ref 3.87–5.11)
RDW: 13.6 % (ref 11.5–15.5)
WBC Count: 7.5 10*3/uL (ref 4.0–10.5)
nRBC: 0 % (ref 0.0–0.2)

## 2021-05-04 LAB — CMP (CANCER CENTER ONLY)
ALT: 11 U/L (ref 0–44)
AST: 16 U/L (ref 15–41)
Albumin: 3.8 g/dL (ref 3.5–5.0)
Alkaline Phosphatase: 95 U/L (ref 38–126)
Anion gap: 9 (ref 5–15)
BUN: 19 mg/dL (ref 8–23)
CO2: 26 mmol/L (ref 22–32)
Calcium: 9.1 mg/dL (ref 8.9–10.3)
Chloride: 104 mmol/L (ref 98–111)
Creatinine: 0.88 mg/dL (ref 0.44–1.00)
GFR, Estimated: >60 mL/min (ref >60)
Glucose, Bld: 143 mg/dL — ABNORMAL HIGH (ref 70–99)
Potassium: 3.8 mmol/L (ref 3.5–5.1)
Sodium: 139 mmol/L (ref 135–145)
Total Bilirubin: 0.3 mg/dL (ref 0.3–1.2)
Total Protein: 7 g/dL (ref 6.5–8.1)

## 2021-05-04 LAB — TSH: TSH: 2.111 u[IU]/mL (ref 0.308–3.960)

## 2021-05-04 MED ORDER — SODIUM CHLORIDE 0.9 % IV SOLN: Freq: Once | INTRAVENOUS | Status: AC

## 2021-05-04 MED ORDER — CYANOCOBALAMIN 1000 MCG/ML IJ SOLN
1000.0000 ug | Freq: Once | INTRAMUSCULAR | Status: AC
  Administered 2021-05-04: 1000 ug via INTRAMUSCULAR
  Filled 2021-05-04: qty 1

## 2021-05-04 MED ORDER — SODIUM CHLORIDE 0.9 % IV SOLN
400.0000 mg | Freq: Once | INTRAVENOUS | Status: AC
  Administered 2021-05-04: 400 mg via INTRAVENOUS
  Filled 2021-05-04: qty 16

## 2021-05-04 MED ORDER — PROCHLORPERAZINE MALEATE 10 MG PO TABS
10.0000 mg | ORAL_TABLET | Freq: Once | ORAL | Status: AC
  Administered 2021-05-04: 10 mg via ORAL
  Filled 2021-05-04: qty 1

## 2021-05-04 NOTE — Progress Notes (Signed)
Hematology and Oncology Follow Up   Lauren Salazar [id number] Dec 22, 1960 61 y.o. 05/04/2021 1:04 PM College, [name] @ [employer], [facility]       Principle Diagnosis: 61 year old woman with lung cancer diagnosed and 2021.  She was found to have T4N2 adenocarcinoma and subsequently developed stage IV disease in 2022.  Prior Therapy:  She is status post robotic assisted laparoscopic cystectomy and pelvic exoneration including hysterectomy, bilateral salpingo-oophorectomy and ileoconduit diversion completed on February 01, 2019.   Radiation therapy with weekly chemotherapy utilizing carboplatin and paclitaxel.  Week 1 of chemotherapy started on May 14, 2019.  Therapy concluded in February 2021.    Durvalumab 10 mg/kg cycle 1 on Sep 03, 2019 every 14 days.  She completed 4 cycles of therapy.   Carboplatin with Alimta and Pembrolizumab started on August 04, 2020.  She completed 3 cycles of therapy in June 2022.   Current therapy: Pembrolizumab 400 mg every 6 weeks started on December 22, 2020.  She is here for the subsequent cycle of therapy.  Interim History: Ms. Seaborn presents today for repeat follow-up.  Since the last visit, he reports no major changes health.  He denies any worsening shortness of breath or difficulty breathing.  She does report nonproductive cough at times but no other complaints.  She denies any hospitalizations or illnesses.     Medications: Reviewed without changes Current Outpatient Medications  Medication Sig Dispense Refill   acetaminophen (TYLENOL) 500 MG tablet Take 1,000 mg by mouth every 8 (eight) hours as needed (pain).  (Patient not taking: Reported on 12/22/2020)     benzonatate (TESSALON) 200 MG capsule TAKE 1 CAPSULE(200 MG) BY MOUTH THREE TIMES DAILY AS NEEDED FOR COUGH (Patient not taking: Reported on 12/22/2020) 30 capsule 2   chlorpheniramine-HYDROcodone (TUSSIONEX) 10-8 MG/5ML SUER Take 5 mLs by mouth every 12  (twelve) hours as needed for cough. (Patient not taking: Reported on 08/01/8117) 147 mL 0   folic acid (FOLVITE) 1 MG tablet Take 1 tablet (1 mg total) by mouth daily. 90 tablet 3   gabapentin (NEURONTIN) 300 MG capsule Take 1 capsule (300 mg total) by mouth 3 (three) times daily. Take for significant back pain around the clock. (Patient not taking: Reported on 12/22/2020) 42 capsule 1   guaiFENesin-dextromethorphan (ROBITUSSIN DM) 100-10 MG/5ML syrup Take 5-10 mLs by mouth every 4 (four) hours as needed for cough. (Patient not taking: Reported on 12/22/2020) 473 mL 0   HYDROcodone-acetaminophen (NORCO) 5-325 MG tablet Take 1 tablet by mouth every 6 (six) hours as needed for moderate pain. (Patient not taking: Reported on 12/22/2020) 30 tablet 0   nicotine (NICODERM CQ - DOSED IN MG/24 HOURS) 21 mg/24hr patch Place 21 mg onto the skin daily. (Patient not taking: Reported on 12/22/2020)     prochlorperazine (COMPAZINE) 10 MG tablet Take 1 tablet (10 mg total) by mouth every 6 (six) hours as needed for nausea or vomiting. 30 tablet 0   sucralfate (CARAFATE) 1 g tablet Dissolve 1 tablet in 10 mL H20 and swallow 30 min prior to meals and bedtime PRN heartburn. (Patient not taking: Reported on 12/22/2020) 40 tablet 2   traMADol (ULTRAM) 50 MG tablet Take 1 tablet (50 mg total) by mouth every 6 (six) hours as needed. (Patient not taking: Reported on 12/22/2020) 30 tablet 0   No current facility-administered medications for this visit.     Allergies: No Known Allergies   Physical exam  Blood pressure 128/85, pulse 97, temperature 97.6  F (36.4 C), temperature source Axillary, resp. rate 17, height 5\' 6"  (1.676 m), weight 134 lb 4.8 oz (60.9 kg), SpO2 99 %.    ECOG 1   General appearance: Alert, awake without any distress. Head: Atraumatic without abnormalities Oropharynx: Without any thrush or ulcers. Eyes: No scleral icterus. Lymph nodes: No lymphadenopathy noted in the cervical, supraclavicular,  or axillary nodes Heart:regular rate and rhythm, without any murmurs or gallops.   Lung: Clear to auscultation without any rhonchi, wheezes or dullness to percussion. Abdomin: Soft, nontender without any shifting dullness or ascites. Musculoskeletal: Clubbing noted in bilateral fingernails. Neurological: No motor or sensory deficits. Skin: No rashes or lesions.        Lab Results: Lab Results  Component Value Date   WBC 8.4 02/15/2021   HGB 13.4 02/15/2021   HCT 39.8 02/15/2021   MCV 90.5 02/15/2021   PLT 266 02/15/2021     Chemistry      Component Value Date/Time   NA 136 02/15/2021 1009   K 4.0 02/15/2021 1009   CL 104 02/15/2021 1009   CO2 24 02/15/2021 1009   BUN 18 02/15/2021 1009   CREATININE 0.84 02/15/2021 1009      Component Value Date/Time   CALCIUM 8.8 (L) 02/15/2021 1009   ALKPHOS 84 02/15/2021 1009   AST 20 02/15/2021 1009   ALT 15 02/15/2021 1009   BILITOT 0.3 02/15/2021 1009           Impression and Plan:  1.   Lung cancer diagnosed in 2021 and subsequently developed stage IV adenocarcinoma with thoracic nodule progression.   She is currently on Pembrolizumab which she has tolerated well without any complaints.  Risks and benefits of continuing this treatment were reviewed at this time.  Autoimmune issues versus GI toxicities among others were reviewed.  Plan is to update her staging scan in the next 6 to 12 weeks.   2.  Goals of care and prognosis: Therapy remains palliative though aggressive measures are warranted at this time.     3.  Autoimmune considerations: Continue to educate her about these issues including pneumonitis, colitis and thyroid disease.  4.  IV access: She opted against Port-A-Cath insertion.  Peripheral veins are currently in use.  5.  Antiemetics: No nausea or vomiting reported at this time.  Compazine is available to her.   6.  Follow-up: She will return in 6 weeks for a follow-up visit.      30  minutes were  meant on this encounter.  Time was dedicated to reviewing disease status, treatment choices planning future plan of care review.  Zola Button, MD 05/04/2021 1:04 PM

## 2021-05-04 NOTE — Patient Instructions (Signed)
Lauren [facility]  Discharge Instructions: Thank you for choosing [facility] to provide your Lauren Salazar and hematology care.   If you have a lab appointment with the [facility], please go directly to the [facility] and check in at the registration area.   Wear comfortable clothing and clothing appropriate for easy access to any Portacath or PICC line.   We strive to give you quality time with your provider. You may need to reschedule your appointment if you arrive late (15 or more minutes).  Arriving late affects you and other patients whose appointments are after yours.  Also, if you miss three or more appointments without notifying the office, you may be dismissed from the clinic at the providers discretion.      For prescription refill requests, have your pharmacy contact our office and allow 72 hours for refills to be completed.    Today you received the following chemotherapy and/or immunotherapy agents [name]      To help prevent nausea and vomiting after your treatment, we encourage you to take your nausea medication as directed.  BELOW ARE SYMPTOMS THAT SHOULD BE REPORTED IMMEDIATELY: *FEVER GREATER THAN 100.4 F (38 C) OR HIGHER *CHILLS OR SWEATING *NAUSEA AND VOMITING THAT IS NOT CONTROLLED WITH YOUR NAUSEA MEDICATION *UNUSUAL SHORTNESS OF BREATH *UNUSUAL BRUISING OR BLEEDING *URINARY PROBLEMS (pain or burning when urinating, or frequent urination) *BOWEL PROBLEMS (unusual diarrhea, constipation, pain near the anus) TENDERNESS IN MOUTH AND THROAT WITH OR WITHOUT PRESENCE OF ULCERS (sore throat, sores in mouth, or a toothache) UNUSUAL RASH, SWELLING OR PAIN  UNUSUAL VAGINAL DISCHARGE OR ITCHING   Items with * indicate a potential emergency and should be followed up as soon as possible or go to the Emergency Department if any problems should occur.  Please show the CHEMOTHERAPY ALERT CARD or IMMUNOTHERAPY ALERT CARD at check-in to  the Emergency Department and triage nurse.  Should you have questions after your visit or need to cancel or reschedule your appointment, please contact [facility]  Dept: [phone]  and follow the prompts.  Office hours are 8:00 a.m. to 4:30 p.m. Monday - Friday. Please note that voicemails left after 4:00 p.m. may not be returned until the following business day.  We are closed weekends and major holidays. You have access to a nurse at all times for urgent questions. Please call the main number to the clinic Dept: [phone] and follow the prompts.   For any non-urgent questions, you may also contact your provider using MyChart. We now offer e-Visits for anyone 61 and older to request care online for non-urgent symptoms. For details visit [email or web address].   Also download the MyChart app! Go to the app store, search "MyChart", open the app, select [facility], and log in with your MyChart username and password.  Due to Covid, a mask is required upon entering the hospital/clinic. If you do not have a mask, one will be given to you upon arrival. For doctor visits, patients may have 1 support person aged 62 or older with them. For treatment visits, patients cannot have anyone with them due to current Covid guidelines and our immunocompromised population.

## 2021-05-27 ENCOUNTER — Telehealth: Payer: Self-pay | Admitting: Oncology

## 2021-05-27 NOTE — Telephone Encounter (Signed)
Scheduled per los, patient has been called and I was unable to leave a voicemail due to it being full. Calender will be mailed.

## 2021-06-15 ENCOUNTER — Telehealth: Payer: Self-pay | Admitting: *Deleted

## 2021-06-15 NOTE — Telephone Encounter (Signed)
Returned PC to patient - she reports she is recovering from the flu, still has congestion in her head & her chest, started having diarrhea today, has had 3 episodes so far.  Advised patient to drink plenty of fluids, water as well as gatorade or any sports drink with electrolytes, also instructed her to take immodium as directed on the package for her diarrhea, instructed her to call this office back if diarrhea continues.  Patient is asking if she should come for her appointments tomorrow, is to have [place] infusion.  Advised patient she should reschedule for next week when she is feeling better, she verbalizes understanding.  Scheduling message sent.  MD aware.

## 2021-06-16 ENCOUNTER — Inpatient Hospital Stay: Payer: Medicaid Other | Admitting: Oncology

## 2021-06-16 ENCOUNTER — Inpatient Hospital Stay: Payer: Medicaid Other

## 2021-06-16 ENCOUNTER — Telehealth: Payer: Self-pay | Admitting: Oncology

## 2021-06-16 NOTE — Telephone Encounter (Signed)
.  Called patient to schedule appointment per 2/21 inbasket, patient is aware of date and time.

## 2021-07-05 ENCOUNTER — Other Ambulatory Visit: Payer: Medicaid Other

## 2021-07-05 ENCOUNTER — Ambulatory Visit: Payer: Medicaid Other | Admitting: Oncology

## 2021-07-05 ENCOUNTER — Ambulatory Visit: Payer: Medicaid Other

## 2021-07-07 ENCOUNTER — Inpatient Hospital Stay: Payer: Medicaid Other | Attending: Oncology

## 2021-07-07 ENCOUNTER — Inpatient Hospital Stay: Payer: Medicaid Other

## 2021-07-07 ENCOUNTER — Inpatient Hospital Stay (HOSPITAL_BASED_OUTPATIENT_CLINIC_OR_DEPARTMENT_OTHER): Payer: Medicaid Other | Admitting: Oncology

## 2021-07-07 ENCOUNTER — Other Ambulatory Visit: Payer: Self-pay

## 2021-07-07 VITALS — HR 98

## 2021-07-07 VITALS — BP 124/76 | HR 106 | Temp 97.8°F | Resp 17 | Ht 66.0 in | Wt 132.8 lb

## 2021-07-07 DIAGNOSIS — C3490 Malignant neoplasm of unspecified part of unspecified bronchus or lung: Secondary | ICD-10-CM

## 2021-07-07 DIAGNOSIS — E538 Deficiency of other specified B group vitamins: Secondary | ICD-10-CM | POA: Insufficient documentation

## 2021-07-07 DIAGNOSIS — C3431 Malignant neoplasm of lower lobe, right bronchus or lung: Secondary | ICD-10-CM

## 2021-07-07 DIAGNOSIS — Z5112 Encounter for antineoplastic immunotherapy: Secondary | ICD-10-CM | POA: Diagnosis present

## 2021-07-07 DIAGNOSIS — Z79899 Other long term (current) drug therapy: Secondary | ICD-10-CM | POA: Insufficient documentation

## 2021-07-07 LAB — CMP (CANCER CENTER ONLY)
ALT: 10 U/L (ref 0–44)
AST: 15 U/L (ref 15–41)
Albumin: 3.8 g/dL (ref 3.5–5.0)
Alkaline Phosphatase: 86 U/L (ref 38–126)
Anion gap: 7 (ref 5–15)
BUN: 21 mg/dL (ref 8–23)
CO2: 28 mmol/L (ref 22–32)
Calcium: 9.4 mg/dL (ref 8.9–10.3)
Chloride: 104 mmol/L (ref 98–111)
Creatinine: 0.85 mg/dL (ref 0.44–1.00)
GFR, Estimated: >60 mL/min (ref >60)
Glucose, Bld: 118 mg/dL — ABNORMAL HIGH (ref 70–99)
Potassium: 3.8 mmol/L (ref 3.5–5.1)
Sodium: 139 mmol/L (ref 135–145)
Total Bilirubin: 0.3 mg/dL (ref 0.3–1.2)
Total Protein: 7.4 g/dL (ref 6.5–8.1)

## 2021-07-07 LAB — CBC WITH DIFFERENTIAL (CANCER CENTER ONLY)
Abs Immature Granulocytes: 0.02 10*3/uL (ref 0.00–0.07)
Basophils Absolute: 0 10*3/uL (ref 0.0–0.1)
Basophils Relative: 1 %
Eosinophils Absolute: 0.1 10*3/uL (ref 0.0–0.5)
Eosinophils Relative: 1 %
HCT: 41 % (ref 36.0–46.0)
Hemoglobin: 13.5 g/dL (ref 12.0–15.0)
Immature Granulocytes: 0 %
Lymphocytes Relative: 12 %
Lymphs Abs: 0.9 10*3/uL (ref 0.7–4.0)
MCH: 29.9 pg (ref 26.0–34.0)
MCHC: 32.9 g/dL (ref 30.0–36.0)
MCV: 90.7 fL (ref 80.0–100.0)
Monocytes Absolute: 0.7 10*3/uL (ref 0.1–1.0)
Monocytes Relative: 9 %
Neutro Abs: 5.8 10*3/uL (ref 1.7–7.7)
Neutrophils Relative %: 77 %
Platelet Count: 302 10*3/uL (ref 150–400)
RBC: 4.52 MIL/uL (ref 3.87–5.11)
RDW: 14 % (ref 11.5–15.5)
WBC Count: 7.6 10*3/uL (ref 4.0–10.5)
nRBC: 0 % (ref 0.0–0.2)

## 2021-07-07 LAB — TSH: TSH: 2.502 u[IU]/mL (ref 0.308–3.960)

## 2021-07-07 MED ORDER — SODIUM CHLORIDE 0.9 % IV SOLN: Freq: Once | INTRAVENOUS | Status: DC

## 2021-07-07 MED ORDER — CYANOCOBALAMIN 1000 MCG/ML IJ SOLN
1000.0000 ug | Freq: Once | INTRAMUSCULAR | Status: AC
  Administered 2021-07-07: 1000 ug via INTRAMUSCULAR
  Filled 2021-07-07: qty 1

## 2021-07-07 MED ORDER — SODIUM CHLORIDE 0.9 % IV SOLN: Freq: Once | INTRAVENOUS | Status: AC

## 2021-07-07 MED ORDER — PROCHLORPERAZINE MALEATE 10 MG PO TABS
10.0000 mg | ORAL_TABLET | Freq: Once | ORAL | Status: AC
  Administered 2021-07-07: 10 mg via ORAL
  Filled 2021-07-07: qty 1

## 2021-07-07 MED ORDER — SODIUM CHLORIDE 0.9 % IV SOLN
400.0000 mg | Freq: Once | INTRAVENOUS | Status: AC
  Administered 2021-07-07: 400 mg via INTRAVENOUS
  Filled 2021-07-07: qty 16

## 2021-07-07 NOTE — Patient Instructions (Signed)
[facility]  Discharge Instructions: ?Thank you for choosing [facility] to provide your oncology and hematology care.  ? ?If you have a lab appointment with the Cancer Center, please go directly to the Cancer Center and check in at the registration area. ?  ?Wear comfortable clothing and clothing appropriate for easy access to any Portacath or PICC line.  ? ?We strive to give you quality time with your provider. You may need to reschedule your appointment if you arrive late (15 or more minutes).  Arriving late affects you and other patients whose appointments are after yours.  Also, if you miss three or more appointments without notifying the office, you may be dismissed from the clinic at the provider?s discretion.    ?  ?For prescription refill requests, have your pharmacy contact our office and allow 72 hours for refills to be completed.   ? ?Today you received the following chemotherapy and/or immunotherapy agents: Keytruda ?  ?To help prevent nausea and vomiting after your treatment, we encourage you to take your nausea medication as directed. ? ?BELOW ARE SYMPTOMS THAT SHOULD BE REPORTED IMMEDIATELY: ?*FEVER GREATER THAN 100.4 F (38 ?C) OR HIGHER ?*CHILLS OR SWEATING ?*NAUSEA AND VOMITING THAT IS NOT CONTROLLED WITH YOUR NAUSEA MEDICATION ?*UNUSUAL SHORTNESS OF BREATH ?*UNUSUAL BRUISING OR BLEEDING ?*URINARY PROBLEMS (pain or burning when urinating, or frequent urination) ?*BOWEL PROBLEMS (unusual diarrhea, constipation, pain near the anus) ?TENDERNESS IN MOUTH AND THROAT WITH OR WITHOUT PRESENCE OF ULCERS (sore throat, sores in mouth, or a toothache) ?UNUSUAL RASH, SWELLING OR PAIN  ?UNUSUAL VAGINAL DISCHARGE OR ITCHING  ? ?Items with * indicate a potential emergency and should be followed up as soon as possible or go to the Emergency Department if any problems should occur. ? ?Please show the CHEMOTHERAPY ALERT CARD or IMMUNOTHERAPY ALERT CARD at check-in to the  Emergency Department and triage nurse. ? ?Should you have questions after your visit or need to cancel or reschedule your appointment, please contact [facility]  Dept: [phone]  and follow the prompts.  Office hours are 8:00 a.m. to 4:30 p.m. Monday - Friday. Please note that voicemails left after 4:00 p.m. may not be returned until the following business day.  We are closed weekends and major holidays. You have access to a nurse at all times for urgent questions. Please call the main number to the clinic Dept: [phone] and follow the prompts. ? ? ?For any non-urgent questions, you may also contact your provider using MyChart. We now offer e-Visits for anyone 18 and older to request care online for non-urgent symptoms. For details visit [email or web address]. ?  ?Also download the MyChart app! Go to the app store, search "MyChart", open the app, select [facility], and log in with your MyChart username and password. ? ?Due to Covid, a mask is required upon entering the hospital/clinic. If you do not have a mask, one will be given to you upon arrival. For doctor visits, patients may have 1 support person aged 18 or older with them. For treatment visits, patients cannot have anyone with them due to current Covid guidelines and our immunocompromised population.  ? ?

## 2021-07-07 NOTE — Progress Notes (Signed)
Hematology and Oncology Follow Up  ? ?Lauren Salazar ?[id number] ?06/16/1960 61 y.o. ?07/07/2021 12:26 PM ?[place], [place] @ [facility]  ? ? ? ? ? ?Principle Diagnosis: 61 year old woman with stage IV adenosquamous lung cancer developed in 2022.  She initially presented with T4N2 disease in 2021. ? ? ?Prior Therapy: ? ?She is status post robotic assisted laparoscopic cystectomy and pelvic exoneration including hysterectomy, bilateral salpingo-oophorectomy and ileoconduit diversion completed on February 01, 2019. ?  ?Radiation therapy with weekly chemotherapy utilizing carboplatin and paclitaxel.  Week 1 of chemotherapy started on May 14, 2019.  Therapy concluded in February 2021. ?  ? Durvalumab 10 mg/kg cycle 1 on Sep 03, 2019 every 14 days.  She completed 4 cycles of therapy. ?  ?Carboplatin with Alimta and Pembrolizumab started on August 04, 2020.  She completed 3 cycles of therapy in June 2022. ? ? ?Current therapy: Pembrolizumab 400 mg every 6 weeks started on December 22, 2020.  She is here for the next cycle of therapy. ? ?Interim History: Lauren Salazar returns today for repeat evaluation.  Since last visit, she reports no major changes in her health.  She was diagnosed with flu and had respiratory illness associated with it.  She had sinus congestion, fatigue and fevers and has resolved at this time.  She continues to have memory and confusion issues at times although no headaches, syncope or altered mental status.  Respiratory status remains stable and she denies any cough or dyspnea on exertion. ? ? ? ? ?Medications: Updated on review. ?Current Outpatient Medications  ?Medication Sig Dispense Refill  ? acetaminophen (TYLENOL) 500 MG tablet Take 1,000 mg by mouth every 8 (eight) hours as needed (pain).  (Patient not taking: Reported on 12/22/2020)    ? benzonatate (TESSALON) 200 MG capsule TAKE 1 CAPSULE(200 MG) BY MOUTH THREE TIMES DAILY AS NEEDED FOR COUGH (Patient  not taking: Reported on 12/22/2020) 30 capsule 2  ? chlorpheniramine-HYDROcodone (TUSSIONEX) 10-8 MG/5ML SUER Take 5 mLs by mouth every 12 (twelve) hours as needed for cough. (Patient not taking: Reported on 09/02/2583) 277 mL 0  ? folic acid (FOLVITE) 1 MG tablet Take 1 tablet (1 mg total) by mouth daily. 90 tablet 3  ? gabapentin (NEURONTIN) 300 MG capsule Take 1 capsule (300 mg total) by mouth 3 (three) times daily. Take for significant back pain around the clock. (Patient not taking: Reported on 12/22/2020) 42 capsule 1  ? guaiFENesin-dextromethorphan (ROBITUSSIN DM) 100-10 MG/5ML syrup Take 5-10 mLs by mouth every 4 (four) hours as needed for cough. (Patient not taking: Reported on 12/22/2020) 473 mL 0  ? HYDROcodone-acetaminophen (NORCO) 5-325 MG tablet Take 1 tablet by mouth every 6 (six) hours as needed for moderate pain. (Patient not taking: Reported on 12/22/2020) 30 tablet 0  ? nicotine (NICODERM CQ - DOSED IN MG/24 HOURS) 21 mg/24hr patch Place 21 mg onto the skin daily. (Patient not taking: Reported on 12/22/2020)    ? prochlorperazine (COMPAZINE) 10 MG tablet Take 1 tablet (10 mg total) by mouth every 6 (six) hours as needed for nausea or vomiting. 30 tablet 0  ? sucralfate (CARAFATE) 1 g tablet Dissolve 1 tablet in 10 mL H20 and swallow 30 min prior to meals and bedtime PRN heartburn. (Patient not taking: Reported on 12/22/2020) 40 tablet 2  ? traMADol (ULTRAM) 50 MG tablet Take 1 tablet (50 mg total) by mouth every 6 (six) hours as needed. (Patient not taking: Reported on 12/22/2020) 30 tablet 0  ? ?  No current facility-administered medications for this visit.  ? ? ? ?Allergies: No Known Allergies ? ? ?Physical exam ? ? ?Blood pressure 124/76, pulse (!) 106, temperature 97.8 ?F (36.6 ?C), temperature source Temporal, resp. rate 17, height 5\' 6"  (1.676 m), weight 132 lb 12.8 oz (60.2 kg), SpO2 98 %. ? ? ? ?ECOG 1 ? ? ? ?General appearance: Comfortable appearing without any discomfort ?Head: Normocephalic  without any trauma ?Oropharynx: Mucous membranes are moist and pink without any thrush or ulcers. ?Eyes: Pupils are equal and round reactive to light. ?Lymph nodes: No cervical, supraclavicular, inguinal or axillary lymphadenopathy.   ?Heart:regular rate and rhythm.  S1 and S2 without leg edema. ?Lung: Clear without any rhonchi or wheezes.  No dullness to percussion. ?Abdomin: Soft, nontender, nondistended with good bowel sounds.  No hepatosplenomegaly. ?Musculoskeletal: No joint deformity or effusion.  Full range of motion noted. ?Neurological: No deficits noted on motor, sensory and deep tendon reflex exam. ?Skin: No petechial rash or dryness.  Appeared moist.  ? ? ? ? ? ? ? ?Lab Results: ?Lab Results  ?Component Value Date  ? WBC 7.5 05/04/2021  ? HGB 13.6 05/04/2021  ? HCT 40.5 05/04/2021  ? MCV 90.0 05/04/2021  ? PLT 250 05/04/2021  ? ?  Chemistry   ?   ?Component Value Date/Time  ? NA 139 05/04/2021 1318  ? K 3.8 05/04/2021 1318  ? CL 104 05/04/2021 1318  ? CO2 26 05/04/2021 1318  ? BUN 19 05/04/2021 1318  ? CREATININE 0.88 05/04/2021 1318  ?    ?Component Value Date/Time  ? CALCIUM 9.1 05/04/2021 1318  ? ALKPHOS 95 05/04/2021 1318  ? AST 16 05/04/2021 1318  ? ALT 11 05/04/2021 1318  ? BILITOT 0.3 05/04/2021 1318  ?  ? ? ? ? ?  ? ?Impression and Plan: ? ?1.   Stage IV adenocarcinoma of the lung with progressive pulmonary nodules diagnosed in 2022. ? ?The natural course of her disease was reviewed at this time and treatment choices were reiterated.  At this time she is currently on Pembrolizumab which she has tolerated reasonably well.  Alternative treatment options including systemic chemotherapy will be used upon disease progression.  The plan is to update her staging scans prior to the next visit. ?  ?2.  Goals of care and prognosis: Her disease is incurable although aggressive measures are warranted. ?  ?  ?3.  Autoimmune considerations: Long-term complication including pneumonitis, colitis and thyroid  disease among other issues were reiterated.  He is not experiencing any at this time. ? ?4.  IV access: Peripheral veins are currently in use she has opted against Port-A-Cath. ? ? ?5.  Antiemetics: Compazine is available to her without any nausea or vomiting. ? ?6.  CNS surveillance: We will obtain MRI of the brain to rule out any CNS metastasis. ? ? ?7.  Follow-up: In 6 weeks for repeat follow-up. ?  ? ?  ?30  minutes were spent on this visit.  The time was dedicated to reviewing laboratory data, disease status update and outlining future plan of care discussion. ? ?Zola Button, MD 07/07/2021 12:26 PM ?

## 2021-07-13 ENCOUNTER — Telehealth: Payer: Self-pay | Admitting: Oncology

## 2021-07-13 NOTE — Telephone Encounter (Signed)
Scheduled per los, patient has been called and notified. 

## 2021-08-13 ENCOUNTER — Telehealth: Payer: Self-pay | Admitting: Oncology

## 2021-08-13 NOTE — Telephone Encounter (Signed)
Called patient regarding upcoming appointment, patient is notified. °

## 2021-08-14 ENCOUNTER — Other Ambulatory Visit: Payer: Self-pay | Admitting: Oncology

## 2021-08-16 ENCOUNTER — Ambulatory Visit (HOSPITAL_COMMUNITY)
Admission: RE | Admit: 2021-08-16 | Discharge: 2021-08-16 | Disposition: A | Discharge status: Home / Self Care | Payer: Medicaid Other | Source: Ambulatory Visit | Attending: Oncology | Admitting: Oncology

## 2021-08-16 ENCOUNTER — Encounter (HOSPITAL_COMMUNITY): Payer: Self-pay | Admitting: Radiology

## 2021-08-16 ENCOUNTER — Encounter: Payer: Self-pay | Admitting: Oncology

## 2021-08-16 DIAGNOSIS — C3490 Malignant neoplasm of unspecified part of unspecified bronchus or lung: Secondary | ICD-10-CM

## 2021-08-16 MED ORDER — IOHEXOL 300 MG/ML  SOLN
100.0000 mL | Freq: Once | INTRAMUSCULAR | Status: AC | PRN
  Administered 2021-08-16: 100 mL via INTRAVENOUS

## 2021-08-16 MED ORDER — GADOBUTROL 1 MMOL/ML IV SOLN
6.0000 mL | Freq: Once | INTRAVENOUS | Status: AC | PRN
  Administered 2021-08-16: 6 mL via INTRAVENOUS

## 2021-08-19 ENCOUNTER — Inpatient Hospital Stay (HOSPITAL_BASED_OUTPATIENT_CLINIC_OR_DEPARTMENT_OTHER): Payer: Medicaid Other | Admitting: Oncology

## 2021-08-19 ENCOUNTER — Other Ambulatory Visit: Payer: Self-pay

## 2021-08-19 ENCOUNTER — Inpatient Hospital Stay: Payer: Medicaid Other | Attending: Oncology

## 2021-08-19 ENCOUNTER — Inpatient Hospital Stay: Payer: Medicaid Other

## 2021-08-19 VITALS — BP 112/76 | HR 102 | Temp 98.1°F | Resp 18 | Ht 66.0 in | Wt 133.5 lb

## 2021-08-19 DIAGNOSIS — D3501 Benign neoplasm of right adrenal gland: Secondary | ICD-10-CM | POA: Diagnosis not present

## 2021-08-19 DIAGNOSIS — D3502 Benign neoplasm of left adrenal gland: Secondary | ICD-10-CM | POA: Diagnosis not present

## 2021-08-19 DIAGNOSIS — R918 Other nonspecific abnormal finding of lung field: Secondary | ICD-10-CM | POA: Diagnosis not present

## 2021-08-19 DIAGNOSIS — K439 Ventral hernia without obstruction or gangrene: Secondary | ICD-10-CM | POA: Insufficient documentation

## 2021-08-19 DIAGNOSIS — C3490 Malignant neoplasm of unspecified part of unspecified bronchus or lung: Secondary | ICD-10-CM | POA: Diagnosis not present

## 2021-08-19 DIAGNOSIS — I700 Atherosclerosis of aorta: Secondary | ICD-10-CM | POA: Diagnosis not present

## 2021-08-19 DIAGNOSIS — C3431 Malignant neoplasm of lower lobe, right bronchus or lung: Secondary | ICD-10-CM

## 2021-08-19 LAB — CMP (CANCER CENTER ONLY)
ALT: 8 U/L (ref 0–44)
AST: 15 U/L (ref 15–41)
Albumin: 3.7 g/dL (ref 3.5–5.0)
Alkaline Phosphatase: 87 U/L (ref 38–126)
Anion gap: 6 (ref 5–15)
BUN: 19 mg/dL (ref 8–23)
CO2: 30 mmol/L (ref 22–32)
Calcium: 9.2 mg/dL (ref 8.9–10.3)
Chloride: 104 mmol/L (ref 98–111)
Creatinine: 0.94 mg/dL (ref 0.44–1.00)
GFR, Estimated: >60 mL/min (ref >60)
Glucose, Bld: 127 mg/dL — ABNORMAL HIGH (ref 70–99)
Potassium: 3.8 mmol/L (ref 3.5–5.1)
Sodium: 140 mmol/L (ref 135–145)
Total Bilirubin: 0.3 mg/dL (ref 0.3–1.2)
Total Protein: 7.1 g/dL (ref 6.5–8.1)

## 2021-08-19 LAB — CBC WITH DIFFERENTIAL (CANCER CENTER ONLY)
Abs Immature Granulocytes: 0.01 10*3/uL (ref 0.00–0.07)
Basophils Absolute: 0.1 10*3/uL (ref 0.0–0.1)
Basophils Relative: 1 %
Eosinophils Absolute: 0.1 10*3/uL (ref 0.0–0.5)
Eosinophils Relative: 2 %
HCT: 41.4 % (ref 36.0–46.0)
Hemoglobin: 13.5 g/dL (ref 12.0–15.0)
Immature Granulocytes: 0 %
Lymphocytes Relative: 12 %
Lymphs Abs: 0.9 10*3/uL (ref 0.7–4.0)
MCH: 29.7 pg (ref 26.0–34.0)
MCHC: 32.6 g/dL (ref 30.0–36.0)
MCV: 91.2 fL (ref 80.0–100.0)
Monocytes Absolute: 0.7 10*3/uL (ref 0.1–1.0)
Monocytes Relative: 9 %
Neutro Abs: 5.6 10*3/uL (ref 1.7–7.7)
Neutrophils Relative %: 76 %
Platelet Count: 282 10*3/uL (ref 150–400)
RBC: 4.54 MIL/uL (ref 3.87–5.11)
RDW: 14 % (ref 11.5–15.5)
WBC Count: 7.4 10*3/uL (ref 4.0–10.5)
nRBC: 0 % (ref 0.0–0.2)

## 2021-08-19 LAB — TSH: TSH: 4.497 u[IU]/mL (ref 0.350–4.500)

## 2021-08-19 NOTE — Progress Notes (Signed)
Hematology and Oncology Follow Up  ? ?Lauren Salazar ?[id number] ?13-Jan-1961 61 y.o. ?08/19/2021 11:35 AM ?Lauren Salazar, Lauren Salazar @ [facility]  ? ? ? ? ? ?Principle Diagnosis: 61 year old woman with stage IV lung cancer diagnosed 2021.  She initially presented with symptoms T4N2 adenosquamous disease in 2021. ? ? ?Prior Therapy: ? ?She is status post robotic assisted laparoscopic cystectomy and pelvic exoneration including hysterectomy, bilateral salpingo-oophorectomy and ileoconduit diversion completed on February 01, 2019. ?  ?Radiation therapy with weekly chemotherapy utilizing carboplatin and paclitaxel.  Week 1 of chemotherapy started on May 14, 2019.  Therapy concluded in February 2021. ?  ? Durvalumab 10 mg/kg cycle 1 on Sep 03, 2019 every 14 days.  She completed 4 cycles of therapy. ?  ?Carboplatin with Alimta and Pembrolizumab started on August 04, 2020.  She completed 3 cycles of therapy in June 2022. ? ? ?Current therapy: Pembrolizumab 400 mg every 6 weeks started on December 22, 2020.  She returns for subsequent cycle of therapy. ? ?Interim History: Lauren Salazar returns today for a follow-up visit.  Since the last visit, she reports no major changes in her health.  She has reported some occasional dizziness and lightheadedness and overall fatigue but no other complications.  She denies any shortness of breath, difficulty breathing or any other complaints.  Her performance status and quality of life remained reasonable. ? ? ? ? ?Medications: Reviewed and updated. ?Current Outpatient Medications  ?Medication Sig Dispense Refill  ? acetaminophen (TYLENOL) 500 MG tablet Take 1,000 mg by mouth every 8 (eight) hours as needed (pain).  (Patient not taking: Reported on 12/22/2020)    ? benzonatate (TESSALON) 200 MG capsule TAKE 1 CAPSULE(200 MG) BY MOUTH THREE TIMES DAILY AS NEEDED FOR COUGH (Patient not taking: Reported on 12/22/2020) 30 capsule 2  ? chlorpheniramine-HYDROcodone  (TUSSIONEX) 10-8 MG/5ML SUER Take 5 mLs by mouth every 12 (twelve) hours as needed for cough. (Patient not taking: Reported on 12/10/5629) 497 mL 0  ? folic acid (FOLVITE) 1 MG tablet TAKE 1 TABLET(1 MG) BY MOUTH DAILY 90 tablet 3  ? gabapentin (NEURONTIN) 300 MG capsule Take 1 capsule (300 mg total) by mouth 3 (three) times daily. Take for significant back pain around the clock. (Patient not taking: Reported on 12/22/2020) 42 capsule 1  ? guaiFENesin-dextromethorphan (ROBITUSSIN DM) 100-10 MG/5ML syrup Take 5-10 mLs by mouth every 4 (four) hours as needed for cough. (Patient not taking: Reported on 12/22/2020) 473 mL 0  ? HYDROcodone-acetaminophen (NORCO) 5-325 MG tablet Take 1 tablet by mouth every 6 (six) hours as needed for moderate pain. (Patient not taking: Reported on 12/22/2020) 30 tablet 0  ? nicotine (NICODERM CQ - DOSED IN MG/24 HOURS) 21 mg/24hr patch Place 21 mg onto the skin daily. (Patient not taking: Reported on 12/22/2020)    ? prochlorperazine (COMPAZINE) 10 MG tablet Take 1 tablet (10 mg total) by mouth every 6 (six) hours as needed for nausea or vomiting. 30 tablet 0  ? sucralfate (CARAFATE) 1 g tablet Dissolve 1 tablet in 10 mL H20 and swallow 30 min prior to meals and bedtime PRN heartburn. (Patient not taking: Reported on 12/22/2020) 40 tablet 2  ? traMADol (ULTRAM) 50 MG tablet Take 1 tablet (50 mg total) by mouth every 6 (six) hours as needed. (Patient not taking: Reported on 12/22/2020) 30 tablet 0  ? ?No current facility-administered medications for this visit.  ? ? ? ?Allergies: No Known Allergies ? ? ?Physical exam ? ? ?Blood  pressure 112/76, pulse (!) 102, temperature 98.1 ?F (36.7 ?C), temperature source Temporal, resp. rate 18, height 5\' 6"  (1.676 m), weight 133 lb 8 oz (60.6 kg), SpO2 99 %. ? ? ? ? ?ECOG 1 ? ? ?General appearance: Alert, awake without any distress. ?Head: Atraumatic without abnormalities ?Oropharynx: Without any thrush or ulcers. ?Eyes: No scleral icterus. ?Lymph nodes: No  lymphadenopathy noted in the cervical, supraclavicular, or axillary nodes ?Heart:regular rate and rhythm, without any murmurs or gallops.   ?Lung: Clear to auscultation without any rhonchi, wheezes or dullness to percussion. ?Abdomin: Soft, nontender without any shifting dullness or ascites. ?Musculoskeletal: No clubbing or cyanosis. ?Neurological: No motor or sensory deficits. ?Skin: No rashes or lesions. ? ? ? ? ? ? ? ?Lab Results: ?Lab Results  ?Component Value Date  ? WBC 7.6 07/07/2021  ? HGB 13.5 07/07/2021  ? HCT 41.0 07/07/2021  ? MCV 90.7 07/07/2021  ? PLT 302 07/07/2021  ? ?  Chemistry   ?   ?Component Value Date/Time  ? NA 139 07/07/2021 1230  ? K 3.8 07/07/2021 1230  ? CL 104 07/07/2021 1230  ? CO2 28 07/07/2021 1230  ? BUN 21 07/07/2021 1230  ? CREATININE 0.85 07/07/2021 1230  ?    ?Component Value Date/Time  ? CALCIUM 9.4 07/07/2021 1230  ? ALKPHOS 86 07/07/2021 1230  ? AST 15 07/07/2021 1230  ? ALT 10 07/07/2021 1230  ? BILITOT 0.3 07/07/2021 1230  ?  ? ?IMPRESSION: ?1. Continued interval progression of the right lower lobe pulmonary ?mass now measuring up to 5.1 x 4.4 x 4.6 cm. Continued progression ?raises concern for recurrent disease. PET-CT may prove helpful to ?further evaluate. ?2. Stable borderline subcarinal lymph node. ?3. No evidence for metastatic disease in the abdomen or pelvis. ?4. Stable bilateral adrenal adenomas. ?5. Midline ventral hernia containing a short segment of transverse ?colon and a short segment of small bowel, without complicating ?features. ?6. Cystectomy with ileal conduit. 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 ?8. Aortic Atherosclerosis (ICD10-I70.0) and Emphysema (ICD10-J43.9). ?  ? ? ?  ? ?Impression and Plan: ? ?1.   Stage IV adenosquamous carcinoma of the lung with pulmonary nodules diagnosed in 2022.   ? ?She continues to be on Pembrolizumab without any major complications.  CT scan obtained on August 16, 2021 showed mild progression of the right lower  lobe pulmonary mass.  No evidence of metastatic disease noted otherwise.  Risks and benefits of continuing this treatment versus different salvage therapy option were reviewed.  After discussion today, I recommended continuing the same dose and schedule and consider different salvage therapy if substantial progression is noted in the future.  Overall her base of disease has been very slow. ? ?After discussion today, she elected to go on a treatment break and resume Pembrolizumab in the future.  He understands his risk of disease progression with treatment break. ?  ?2.  Goals of care and prognosis: Therapy is palliative at this time although aggressive measures are warranted. ?  ?  ?3.  Autoimmune considerations: I continue to educate her about potential complication clued pneumonitis, colitis and thyroid disease. ? ?4.  IV access: No issues reported with peripheral veins at this time. ? ? ?5.  Antiemetics: No nausea or vomiting reported at this time.  Compazine is available to her. ? ? ?6.  CNS surveillance: MRI of the brain obtained on August 16, 2021 showed no evidence of metastatic disease. ? ? ?7.  Follow-up: She will return  in 3 months ? ?  ?30  minutes were dedicated to this encounter.  The time was spent on reviewing laboratory data, imaging studies and future plan of care discussion. ? ?Zola Button, MD 08/19/2021 11:35 AM ?

## 2021-09-30 ENCOUNTER — Telehealth: Payer: Self-pay | Admitting: Oncology

## 2021-09-30 NOTE — Telephone Encounter (Signed)
Rescheduled July appointment due to provider pal, called patient regarding new upcoming appointment. Patient is notified.

## 2021-10-05 ENCOUNTER — Telehealth: Payer: Self-pay | Admitting: Oncology

## 2021-10-05 NOTE — Telephone Encounter (Signed)
Called patient regarding upcoming August appointments, patient is notified. 

## 2021-11-15 ENCOUNTER — Other Ambulatory Visit: Payer: Self-pay

## 2021-11-17 ENCOUNTER — Other Ambulatory Visit: Payer: Self-pay

## 2021-11-19 ENCOUNTER — Ambulatory Visit: Payer: Medicaid Other

## 2021-11-19 ENCOUNTER — Ambulatory Visit: Payer: Medicaid Other | Admitting: Oncology

## 2021-11-19 ENCOUNTER — Other Ambulatory Visit: Payer: Medicaid Other

## 2021-11-23 ENCOUNTER — Other Ambulatory Visit: Payer: Medicaid Other

## 2021-11-23 ENCOUNTER — Ambulatory Visit: Payer: Medicaid Other

## 2021-11-23 ENCOUNTER — Ambulatory Visit: Payer: Medicaid Other | Admitting: Oncology

## 2021-11-26 ENCOUNTER — Telehealth: Payer: Self-pay | Admitting: Oncology

## 2021-11-30 ENCOUNTER — Other Ambulatory Visit: Payer: Self-pay

## 2021-12-01 ENCOUNTER — Other Ambulatory Visit: Payer: Self-pay

## 2021-12-02 ENCOUNTER — Other Ambulatory Visit: Payer: Self-pay

## 2021-12-05 ENCOUNTER — Other Ambulatory Visit: Payer: Self-pay | Admitting: Oncology

## 2021-12-05 DIAGNOSIS — C3490 Malignant neoplasm of unspecified part of unspecified bronchus or lung: Secondary | ICD-10-CM

## 2021-12-07 ENCOUNTER — Inpatient Hospital Stay: Payer: Medicaid Other

## 2021-12-07 ENCOUNTER — Inpatient Hospital Stay: Payer: Medicaid Other | Admitting: Oncology

## 2021-12-31 ENCOUNTER — Inpatient Hospital Stay: Payer: Medicaid Other | Admitting: Oncology

## 2021-12-31 ENCOUNTER — Inpatient Hospital Stay: Payer: Medicaid Other

## 2022-01-27 ENCOUNTER — Other Ambulatory Visit: Payer: Self-pay | Admitting: Sports Medicine

## 2022-01-27 DIAGNOSIS — M5441 Lumbago with sciatica, right side: Secondary | ICD-10-CM

## 2022-02-01 ENCOUNTER — Other Ambulatory Visit: Payer: Self-pay

## 2022-02-03 ENCOUNTER — Other Ambulatory Visit: Payer: Self-pay

## 2022-02-03 ENCOUNTER — Inpatient Hospital Stay (HOSPITAL_BASED_OUTPATIENT_CLINIC_OR_DEPARTMENT_OTHER): Payer: Medicaid Other | Admitting: Oncology

## 2022-02-03 ENCOUNTER — Inpatient Hospital Stay: Payer: Medicaid Other | Attending: Oncology

## 2022-02-03 VITALS — BP 154/80 | HR 90 | Temp 98.2°F | Resp 15 | Ht 66.0 in | Wt 128.3 lb

## 2022-02-03 DIAGNOSIS — Z79899 Other long term (current) drug therapy: Secondary | ICD-10-CM | POA: Diagnosis not present

## 2022-02-03 DIAGNOSIS — Z5112 Encounter for antineoplastic immunotherapy: Secondary | ICD-10-CM | POA: Insufficient documentation

## 2022-02-03 DIAGNOSIS — C3490 Malignant neoplasm of unspecified part of unspecified bronchus or lung: Secondary | ICD-10-CM | POA: Diagnosis not present

## 2022-02-03 DIAGNOSIS — M549 Dorsalgia, unspecified: Secondary | ICD-10-CM | POA: Insufficient documentation

## 2022-02-03 DIAGNOSIS — C3431 Malignant neoplasm of lower lobe, right bronchus or lung: Secondary | ICD-10-CM | POA: Diagnosis not present

## 2022-02-03 DIAGNOSIS — Z923 Personal history of irradiation: Secondary | ICD-10-CM | POA: Diagnosis not present

## 2022-02-03 DIAGNOSIS — Z9221 Personal history of antineoplastic chemotherapy: Secondary | ICD-10-CM | POA: Insufficient documentation

## 2022-02-03 DIAGNOSIS — M545 Low back pain, unspecified: Secondary | ICD-10-CM | POA: Diagnosis not present

## 2022-02-03 LAB — CBC WITH DIFFERENTIAL (CANCER CENTER ONLY)
Abs Immature Granulocytes: 0.02 10*3/uL (ref 0.00–0.07)
Basophils Absolute: 0.1 10*3/uL (ref 0.0–0.1)
Basophils Relative: 1 %
Eosinophils Absolute: 0.2 10*3/uL (ref 0.0–0.5)
Eosinophils Relative: 3 %
HCT: 45.1 % (ref 36.0–46.0)
Hemoglobin: 14.7 g/dL (ref 12.0–15.0)
Immature Granulocytes: 0 %
Lymphocytes Relative: 14 %
Lymphs Abs: 1.1 10*3/uL (ref 0.7–4.0)
MCH: 29.5 pg (ref 26.0–34.0)
MCHC: 32.6 g/dL (ref 30.0–36.0)
MCV: 90.4 fL (ref 80.0–100.0)
Monocytes Absolute: 0.7 10*3/uL (ref 0.1–1.0)
Monocytes Relative: 9 %
Neutro Abs: 5.7 10*3/uL (ref 1.7–7.7)
Neutrophils Relative %: 73 %
Platelet Count: 246 10*3/uL (ref 150–400)
RBC: 4.99 MIL/uL (ref 3.87–5.11)
RDW: 15.7 % — ABNORMAL HIGH (ref 11.5–15.5)
WBC Count: 7.7 10*3/uL (ref 4.0–10.5)
nRBC: 0 % (ref 0.0–0.2)

## 2022-02-03 LAB — CMP (CANCER CENTER ONLY)
ALT: 11 U/L (ref 0–44)
AST: 16 U/L (ref 15–41)
Albumin: 3.9 g/dL (ref 3.5–5.0)
Alkaline Phosphatase: 80 U/L (ref 38–126)
Anion gap: 7 (ref 5–15)
BUN: 20 mg/dL (ref 8–23)
CO2: 28 mmol/L (ref 22–32)
Calcium: 9.1 mg/dL (ref 8.9–10.3)
Chloride: 104 mmol/L (ref 98–111)
Creatinine: 0.9 mg/dL (ref 0.44–1.00)
GFR, Estimated: >60 mL/min (ref >60)
Glucose, Bld: 124 mg/dL — ABNORMAL HIGH (ref 70–99)
Potassium: 4.2 mmol/L (ref 3.5–5.1)
Sodium: 139 mmol/L (ref 135–145)
Total Bilirubin: 0.3 mg/dL (ref 0.3–1.2)
Total Protein: 7.2 g/dL (ref 6.5–8.1)

## 2022-02-03 LAB — TSH: TSH: 6.993 u[IU]/mL — ABNORMAL HIGH (ref 0.350–4.500)

## 2022-02-03 NOTE — Progress Notes (Signed)
Hematology and Oncology Follow Up   Lauren Salazar [id number] 01-Dec-1960 61 y.o. 02/03/2022 10:22 AM [place], [name] @ [facility]       Principle Diagnosis: 61 year old woman with T4N2 lung cancer diagnosed in 2021.  She subsequently developed stage IV adenosquamous disease.  Her tumor proportion score was 1% and no actionable mutation on [employer] ration sequencing obtained and 2020.  Prior Therapy:  She is status post robotic assisted laparoscopic cystectomy and pelvic exoneration including hysterectomy, bilateral salpingo-oophorectomy and ileoconduit diversion completed on February 01, 2019.   Radiation therapy with weekly chemotherapy utilizing carboplatin and paclitaxel.  Week 1 of chemotherapy started on May 14, 2019.  Therapy concluded in February 2021.    Durvalumab 10 mg/kg cycle 1 on Sep 03, 2019 every 14 days.  She completed 4 cycles of therapy.   Carboplatin with Alimta and Pembrolizumab started on August 04, 2020.  She completed 3 cycles of therapy in June 2022.   Current therapy: Pembrolizumab 400 mg every 6 weeks started on December 22, 2020.  Her last treatment was in March 2023 and opted to proceed with treatment break.  Interim History: Ms. Laham presents today for a follow-up.  Since the last visit, she reports no major changes in her health.  She has developed lower back pain and currently under evaluation with a upcoming MRI.  She did have a cortisone injection without any significant improvement.  She still to be able to ambulate without any difficulties.  She denies any falls or syncope or neurological deficits.    Medications: Updated on review. Current Outpatient Medications  Medication Sig Dispense Refill   acetaminophen (TYLENOL) 500 MG tablet Take 1,000 mg by mouth every 8 (eight) hours as needed (pain).  (Patient not taking: Reported on 12/22/2020)     benzonatate (TESSALON) 200 MG capsule TAKE 1 CAPSULE(200 MG)  BY MOUTH THREE TIMES DAILY AS NEEDED FOR COUGH (Patient not taking: Reported on 12/22/2020) 30 capsule 2   chlorpheniramine-HYDROcodone (TUSSIONEX) 10-8 MG/5ML SUER Take 5 mLs by mouth every 12 (twelve) hours as needed for cough. (Patient not taking: Reported on 8/92/1194) 174 mL 0   folic acid (FOLVITE) 1 MG tablet TAKE 1 TABLET(1 MG) BY MOUTH DAILY 90 tablet 3   gabapentin (NEURONTIN) 300 MG capsule Take 1 capsule (300 mg total) by mouth 3 (three) times daily. Take for significant back pain around the clock. (Patient not taking: Reported on 12/22/2020) 42 capsule 1   guaiFENesin-dextromethorphan (ROBITUSSIN DM) 100-10 MG/5ML syrup Take 5-10 mLs by mouth every 4 (four) hours as needed for cough. (Patient not taking: Reported on 12/22/2020) 473 mL 0   HYDROcodone-acetaminophen (NORCO) 5-325 MG tablet Take 1 tablet by mouth every 6 (six) hours as needed for moderate pain. (Patient not taking: Reported on 12/22/2020) 30 tablet 0   nicotine (NICODERM CQ - DOSED IN MG/24 HOURS) 21 mg/24hr patch Place 21 mg onto the skin daily. (Patient not taking: Reported on 12/22/2020)     prochlorperazine (COMPAZINE) 10 MG tablet Take 1 tablet (10 mg total) by mouth every 6 (six) hours as needed for nausea or vomiting. 30 tablet 0   sucralfate (CARAFATE) 1 g tablet Dissolve 1 tablet in 10 mL H20 and swallow 30 min prior to meals and bedtime PRN heartburn. (Patient not taking: Reported on 12/22/2020) 40 tablet 2   traMADol (ULTRAM) 50 MG tablet Take 1 tablet (50 mg total) by mouth every 6 (six) hours as needed. (Patient not taking: Reported on 12/22/2020)  30 tablet 0   No current facility-administered medications for this visit.     Allergies: No Known Allergies   Physical exam     Blood pressure (!) 154/80, pulse 90, temperature 98.2 F (36.8 C), temperature source Temporal, resp. rate 15, height 5\' 6"  (1.676 m), weight 128 lb 4.8 oz (58.2 kg), SpO2 100 %.    ECOG 1    General appearance: Comfortable  appearing without any discomfort Head: Normocephalic without any trauma Oropharynx: Mucous membranes are moist and pink without any thrush or ulcers. Eyes: Pupils are equal and round reactive to light. Lymph nodes: No cervical, supraclavicular, inguinal or axillary lymphadenopathy.   Heart:regular rate and rhythm.  S1 and S2 without leg edema. Lung: Clear without any rhonchi or wheezes.  No dullness to percussion. Abdomin: Soft, nontender, nondistended with good bowel sounds.  No hepatosplenomegaly. Musculoskeletal: No joint deformity or effusion.  Full range of motion noted. Neurological: No deficits noted on motor, sensory and deep tendon reflex exam. Skin: No petechial rash or dryness.  Appeared moist.         Lab Results: Lab Results  Component Value Date   WBC 7.4 08/19/2021   HGB 13.5 08/19/2021   HCT 41.4 08/19/2021   MCV 91.2 08/19/2021   PLT 282 08/19/2021     Chemistry      Component Value Date/Time   NA 140 08/19/2021 1143   K 3.8 08/19/2021 1143   CL 104 08/19/2021 1143   CO2 30 08/19/2021 1143   BUN 19 08/19/2021 1143   CREATININE 0.94 08/19/2021 1143      Component Value Date/Time   CALCIUM 9.2 08/19/2021 1143   ALKPHOS 87 08/19/2021 1143   AST 15 08/19/2021 1143   ALT 8 08/19/2021 1143   BILITOT 0.3 08/19/2021 1143           Impression and Plan:  1.   Lung cancer diagnosed in 2020.  She developed stage IV adenosquamous carcinoma with pulmonary nodules diagnosed in 2022.    She has been on single agent Pembrolizumab maintenance but opted against treatment last time and wanted a treatment break.  Risks and benefits of resuming this treatment were discussed at this time.  Different salvage therapy options including chemotherapy based options will be introduced if she has disease progression.  She has not been keen on cytotoxic chemotherapy and has been very hesitant to receive any systemic therapy.  After discussion today, she opted against staging  CT scan she is agreeable to start pembrolizumab.  Complications were reiterated including autoimmune concerns as well as GI toxicity were discussed.   2.  Goals of care and prognosis: Her disease is incurable and any treatment is palliative.     3.  Autoimmune considerations: She has not experienced any complications at this time.  These include pneumonitis, colitis and thyroid disease.  4.  IV access: Peripheral veins are currently in use without any need for Port-A-Cath.   5.  Antiemetics: Compazine is available to her without any nausea or vomiting.   6.  CNS surveillance: Imaging studies in April 2023 did not show any evidence of metastasis.   7.  Follow-up: In the future to restart treatment.    30  minutes were spent on this visit.  The time was dedicated to reviewing laboratory data, disease status update and outlining future plan of care discussion.  Zola Button, MD 02/03/2022 10:22 AM

## 2022-02-04 ENCOUNTER — Other Ambulatory Visit: Payer: Self-pay

## 2022-02-04 LAB — T4: T4, Total: 7 ug/dL (ref 4.5–12.0)

## 2022-02-07 ENCOUNTER — Telehealth: Payer: Self-pay | Admitting: Oncology

## 2022-02-07 NOTE — Telephone Encounter (Signed)
Per 10/16 phone line pt called to r/s appointment  per pts request moved appointment.  She can only come on  Monday or Tuesday

## 2022-02-11 ENCOUNTER — Inpatient Hospital Stay: Payer: Medicaid Other

## 2022-02-12 ENCOUNTER — Ambulatory Visit
Admission: RE | Admit: 2022-02-12 | Discharge: 2022-02-12 | Disposition: A | Discharge status: Home / Self Care | Payer: Medicaid Other | Source: Ambulatory Visit | Attending: Sports Medicine | Admitting: Sports Medicine

## 2022-02-12 ENCOUNTER — Other Ambulatory Visit: Payer: Self-pay

## 2022-02-12 DIAGNOSIS — M5441 Lumbago with sciatica, right side: Secondary | ICD-10-CM

## 2022-02-14 ENCOUNTER — Inpatient Hospital Stay: Payer: Medicaid Other

## 2022-02-14 ENCOUNTER — Other Ambulatory Visit: Payer: Self-pay

## 2022-02-14 VITALS — BP 132/85 | HR 105 | Temp 98.3°F | Resp 17 | Wt 129.8 lb

## 2022-02-14 DIAGNOSIS — C3490 Malignant neoplasm of unspecified part of unspecified bronchus or lung: Secondary | ICD-10-CM

## 2022-02-14 DIAGNOSIS — C3431 Malignant neoplasm of lower lobe, right bronchus or lung: Secondary | ICD-10-CM | POA: Diagnosis not present

## 2022-02-14 LAB — CBC WITH DIFFERENTIAL (CANCER CENTER ONLY)
Abs Immature Granulocytes: 0.03 10*3/uL (ref 0.00–0.07)
Basophils Absolute: 0.1 10*3/uL (ref 0.0–0.1)
Basophils Relative: 1 %
Eosinophils Absolute: 0.1 10*3/uL (ref 0.0–0.5)
Eosinophils Relative: 1 %
HCT: 41.9 % (ref 36.0–46.0)
Hemoglobin: 14 g/dL (ref 12.0–15.0)
Immature Granulocytes: 0 %
Lymphocytes Relative: 13 %
Lymphs Abs: 1 10*3/uL (ref 0.7–4.0)
MCH: 29.6 pg (ref 26.0–34.0)
MCHC: 33.4 g/dL (ref 30.0–36.0)
MCV: 88.6 fL (ref 80.0–100.0)
Monocytes Absolute: 0.7 10*3/uL (ref 0.1–1.0)
Monocytes Relative: 9 %
Neutro Abs: 5.8 10*3/uL (ref 1.7–7.7)
Neutrophils Relative %: 76 %
Platelet Count: 286 10*3/uL (ref 150–400)
RBC: 4.73 MIL/uL (ref 3.87–5.11)
RDW: 15 % (ref 11.5–15.5)
WBC Count: 7.7 10*3/uL (ref 4.0–10.5)
nRBC: 0 % (ref 0.0–0.2)

## 2022-02-14 LAB — CMP (CANCER CENTER ONLY)
ALT: 9 U/L (ref 0–44)
AST: 15 U/L (ref 15–41)
Albumin: 3.9 g/dL (ref 3.5–5.0)
Alkaline Phosphatase: 91 U/L (ref 38–126)
Anion gap: 7 (ref 5–15)
BUN: 17 mg/dL (ref 8–23)
CO2: 26 mmol/L (ref 22–32)
Calcium: 9.4 mg/dL (ref 8.9–10.3)
Chloride: 102 mmol/L (ref 98–111)
Creatinine: 0.91 mg/dL (ref 0.44–1.00)
GFR, Estimated: >60 mL/min (ref >60)
Glucose, Bld: 109 mg/dL — ABNORMAL HIGH (ref 70–99)
Potassium: 3.7 mmol/L (ref 3.5–5.1)
Sodium: 135 mmol/L (ref 135–145)
Total Bilirubin: 0.3 mg/dL (ref 0.3–1.2)
Total Protein: 7.4 g/dL (ref 6.5–8.1)

## 2022-02-14 LAB — TSH: TSH: 3.25 u[IU]/mL (ref 0.350–4.500)

## 2022-02-14 MED ORDER — SODIUM CHLORIDE 0.9 % IV SOLN: Freq: Once | INTRAVENOUS | Status: AC

## 2022-02-14 MED ORDER — SODIUM CHLORIDE 0.9 % IV SOLN
400.0000 mg | Freq: Once | INTRAVENOUS | Status: AC
  Administered 2022-02-14: 400 mg via INTRAVENOUS
  Filled 2022-02-14: qty 16

## 2022-02-14 NOTE — Patient Instructions (Signed)
[facility]COL[facility]  Discharge Instructions: Thank you for choosing [facility] to provide your oncology and hematology care.   If you have a lab appointment with the [facility], please go directly to the [facility] and check in at the registration area.   Wear comfortable clothing and clothing appropriate for easy access to any Portacath or PICC line.   We strive to give you quality time with your provider. You may need to reschedule your appointment if you arrive late (15 or more minutes).  Arriving late affects you and other patients whose appointments are after yours.  Also, if you miss three or more appointments without notifying the office, you may be dismissed from the clinic at the provider's discretion.      For prescription refill requests, have your pharmacy contact our office and allow 72 hours for refills to be completed.    Today you received the following chemotherapy and/or immunotherapy agents: pembrolizumab      To help prevent nausea and vomiting after your treatment, we encourage you to take your nausea medication as directed.  BELOW ARE SYMPTOMS THAT SHOULD BE REPORTED IMMEDIATELY: *FEVER GREATER THAN 100.4 F (38 C) OR HIGHER *CHILLS OR SWEATING *NAUSEA AND VOMITING THAT IS NOT CONTROLLED WITH YOUR NAUSEA MEDICATION *UNUSUAL SHORTNESS OF BREATH *UNUSUAL BRUISING OR BLEEDING *URINARY PROBLEMS (pain or burning when urinating, or frequent urination) *BOWEL PROBLEMS (unusual diarrhea, constipation, pain near the anus) TENDERNESS IN MOUTH AND THROAT WITH OR WITHOUT PRESENCE OF ULCERS (sore throat, sores in mouth, or a toothache) UNUSUAL RASH, SWELLING OR PAIN  UNUSUAL VAGINAL DISCHARGE OR ITCHING   Items with * indicate a potential emergency and should be followed up as soon as possible or go to the Emergency Department if any problems should occur.  Please show the CHEMOTHERAPY ALERT CARD or IMMUNOTHERAPY ALERT CARD at check-in  to the Emergency Department and triage nurse.  Should you have questions after your visit or need to cancel or reschedule your appointment, please contact [facility]  Dept: [phone]  and follow the prompts.  Office hours are 8:00 a.m. to 4:30 p.m. Monday - Friday. Please note that voicemails left after 4:00 p.m. may not be returned until the following business day.  We are closed weekends and major holidays. You have access to a nurse at all times for urgent questions. Please call the main number to the clinic Dept: [phone] and follow the prompts.   For any non-urgent questions, you may also contact your provider using MyChart. We now offer e-Visits for anyone 26 and older to request care online for non-urgent symptoms. For details visit [email or web address].   Also download the MyChart app! Go to the app store, search "MyChart", open the app, select [facility], and log in with your MyChart username and password.  Masks are optional in the cancer centers. If you would like for your care team to wear a mask while they are taking care of you, please let them know. You may have one support person who is at least 63 years old accompany you for your appointments.

## 2022-02-27 ENCOUNTER — Other Ambulatory Visit: Payer: Self-pay

## 2022-03-21 ENCOUNTER — Other Ambulatory Visit: Payer: Self-pay

## 2022-03-25 ENCOUNTER — Ambulatory Visit: Payer: Medicaid Other

## 2022-03-25 ENCOUNTER — Other Ambulatory Visit: Payer: Medicaid Other

## 2022-03-25 ENCOUNTER — Ambulatory Visit: Payer: Medicaid Other | Admitting: Oncology

## 2022-03-28 ENCOUNTER — Inpatient Hospital Stay: Payer: Medicaid Other | Attending: Oncology

## 2022-03-28 ENCOUNTER — Inpatient Hospital Stay: Payer: Medicaid Other

## 2022-03-28 VITALS — BP 156/89 | HR 100 | Temp 97.6°F | Resp 18 | Wt 130.1 lb

## 2022-03-28 DIAGNOSIS — C3431 Malignant neoplasm of lower lobe, right bronchus or lung: Secondary | ICD-10-CM | POA: Diagnosis present

## 2022-03-28 DIAGNOSIS — Z5112 Encounter for antineoplastic immunotherapy: Secondary | ICD-10-CM | POA: Diagnosis not present

## 2022-03-28 DIAGNOSIS — C3490 Malignant neoplasm of unspecified part of unspecified bronchus or lung: Secondary | ICD-10-CM

## 2022-03-28 LAB — CBC WITH DIFFERENTIAL (CANCER CENTER ONLY)
Abs Immature Granulocytes: 0.04 10*3/uL (ref 0.00–0.07)
Basophils Absolute: 0.1 10*3/uL (ref 0.0–0.1)
Basophils Relative: 1 %
Eosinophils Absolute: 0.4 10*3/uL (ref 0.0–0.5)
Eosinophils Relative: 4 %
HCT: 40 % (ref 36.0–46.0)
Hemoglobin: 13.3 g/dL (ref 12.0–15.0)
Immature Granulocytes: 0 %
Lymphocytes Relative: 10 %
Lymphs Abs: 1 10*3/uL (ref 0.7–4.0)
MCH: 30.4 pg (ref 26.0–34.0)
MCHC: 33.3 g/dL (ref 30.0–36.0)
MCV: 91.3 fL (ref 80.0–100.0)
Monocytes Absolute: 0.6 10*3/uL (ref 0.1–1.0)
Monocytes Relative: 6 %
Neutro Abs: 8.1 10*3/uL — ABNORMAL HIGH (ref 1.7–7.7)
Neutrophils Relative %: 79 %
Platelet Count: 322 10*3/uL (ref 150–400)
RBC: 4.38 MIL/uL (ref 3.87–5.11)
RDW: 15.8 % — ABNORMAL HIGH (ref 11.5–15.5)
WBC Count: 10.1 10*3/uL (ref 4.0–10.5)
nRBC: 0 % (ref 0.0–0.2)

## 2022-03-28 LAB — CMP (CANCER CENTER ONLY)
ALT: 9 U/L (ref 0–44)
AST: 16 U/L (ref 15–41)
Albumin: 3.9 g/dL (ref 3.5–5.0)
Alkaline Phosphatase: 78 U/L (ref 38–126)
Anion gap: 11 (ref 5–15)
BUN: 23 mg/dL (ref 8–23)
CO2: 24 mmol/L (ref 22–32)
Calcium: 9.4 mg/dL (ref 8.9–10.3)
Chloride: 103 mmol/L (ref 98–111)
Creatinine: 0.96 mg/dL (ref 0.44–1.00)
GFR, Estimated: >60 mL/min (ref >60)
Glucose, Bld: 174 mg/dL — ABNORMAL HIGH (ref 70–99)
Potassium: 3.9 mmol/L (ref 3.5–5.1)
Sodium: 138 mmol/L (ref 135–145)
Total Bilirubin: 0.3 mg/dL (ref 0.3–1.2)
Total Protein: 7.2 g/dL (ref 6.5–8.1)

## 2022-03-28 LAB — TSH: TSH: 2.022 u[IU]/mL (ref 0.350–4.500)

## 2022-03-28 MED ORDER — SODIUM CHLORIDE 0.9 % IV SOLN: Freq: Once | INTRAVENOUS | Status: AC

## 2022-03-28 MED ORDER — SODIUM CHLORIDE 0.9 % IV SOLN
400.0000 mg | Freq: Once | INTRAVENOUS | Status: AC
  Administered 2022-03-28: 400 mg via INTRAVENOUS
  Filled 2022-03-28: qty 16

## 2022-03-28 NOTE — Patient Instructions (Signed)
[facility]  Discharge Instructions: Thank you for choosing [facility] to provide your oncology and hematology care.   If you have a lab appointment with the [facility], please go directly to the [facility] and check in at the registration area.   Wear comfortable clothing and clothing appropriate for easy access to any Portacath or PICC line.   We strive to give you quality time with your provider. You may need to reschedule your appointment if you arrive late (15 or more minutes).  Arriving late affects you and other patients whose appointments are after yours.  Also, if you miss three or more appointments without notifying the office, you may be dismissed from the clinic at the provider's discretion.      For prescription refill requests, have your pharmacy contact our office and allow 72 hours for refills to be completed.    Today you received the following chemotherapy and/or immunotherapy agents [name]      To help prevent nausea and vomiting after your treatment, we encourage you to take your nausea medication as directed.  BELOW ARE SYMPTOMS THAT SHOULD BE REPORTED IMMEDIATELY: *FEVER GREATER THAN 100.4 F (38 C) OR HIGHER *CHILLS OR SWEATING *NAUSEA AND VOMITING THAT IS NOT CONTROLLED WITH YOUR NAUSEA MEDICATION *UNUSUAL SHORTNESS OF BREATH *UNUSUAL BRUISING OR BLEEDING *URINARY PROBLEMS (pain or burning when urinating, or frequent urination) *BOWEL PROBLEMS (unusual diarrhea, constipation, pain near the anus) TENDERNESS IN MOUTH AND THROAT WITH OR WITHOUT PRESENCE OF ULCERS (sore throat, sores in mouth, or a toothache) UNUSUAL RASH, SWELLING OR PAIN  UNUSUAL VAGINAL DISCHARGE OR ITCHING   Items with * indicate a potential emergency and should be followed up as soon as possible or go to the Emergency Department if any problems should occur.  Please show the CHEMOTHERAPY ALERT CARD or IMMUNOTHERAPY ALERT CARD at check-in to  the Emergency Department and triage nurse.  Should you have questions after your visit or need to cancel or reschedule your appointment, please contact [facility]  Dept: [phone]  and follow the prompts.  Office hours are 8:00 a.m. to 4:30 p.m. Monday - Friday. Please note that voicemails left after 4:00 p.m. may not be returned until the following business day.  We are closed weekends and major holidays. You have access to a nurse at all times for urgent questions. Please call the main number to the clinic Dept: [phone] and follow the prompts.   For any non-urgent questions, you may also contact your provider using MyChart. We now offer e-Visits for anyone 77 and older to request care online for non-urgent symptoms. For details visit [email or web address].   Also download the MyChart app! Go to the app store, search "MyChart", open the app, select [facility], and log in with your MyChart username and password.  Masks are optional in the cancer centers. If you would like for your care team to wear a mask while they are taking care of you, please let them know. You may have one support person who is at least 63 years old accompany you for your appointments.

## 2022-03-28 NOTE — Progress Notes (Signed)
Per Dr. Julien Nordmann, ok to proceed with treatment without CMP results

## 2022-03-29 LAB — T4: T4, Total: 6 ug/dL (ref 4.5–12.0)

## 2022-03-30 ENCOUNTER — Telehealth: Payer: Self-pay | Admitting: Oncology

## 2022-03-30 NOTE — Telephone Encounter (Signed)
Called patient regarding January appointments, patient is notified.

## 2022-04-28 ENCOUNTER — Other Ambulatory Visit: Payer: Self-pay

## 2022-05-09 ENCOUNTER — Inpatient Hospital Stay: Payer: Medicaid Other | Admitting: Oncology

## 2022-05-09 ENCOUNTER — Inpatient Hospital Stay: Payer: Medicaid Other

## 2022-05-10 ENCOUNTER — Ambulatory Visit: Payer: Medicaid Other

## 2022-05-10 ENCOUNTER — Inpatient Hospital Stay: Payer: Medicaid Other

## 2022-05-10 ENCOUNTER — Inpatient Hospital Stay (HOSPITAL_BASED_OUTPATIENT_CLINIC_OR_DEPARTMENT_OTHER): Payer: Medicaid Other | Admitting: Oncology

## 2022-05-10 ENCOUNTER — Inpatient Hospital Stay: Payer: Medicaid Other | Attending: Oncology

## 2022-05-10 ENCOUNTER — Other Ambulatory Visit: Payer: Self-pay

## 2022-05-10 DIAGNOSIS — C679 Malignant neoplasm of bladder, unspecified: Secondary | ICD-10-CM | POA: Diagnosis not present

## 2022-05-10 DIAGNOSIS — C3490 Malignant neoplasm of unspecified part of unspecified bronchus or lung: Secondary | ICD-10-CM

## 2022-05-10 DIAGNOSIS — R109 Unspecified abdominal pain: Secondary | ICD-10-CM | POA: Insufficient documentation

## 2022-05-10 NOTE — Progress Notes (Signed)
Hematology and Oncology Follow Up   Lauren Salazar [id number] [birth year] 62 y.o. 05/10/2022 12:49 PM [facility], [name] @ [facility]al[facility] Fiddler, MD       Principle Diagnosis: 62 year old woman with lung cancer diagnosed in 2021.  She presented with T4N2  adenosquamous tumor, tumor proportion score was 1% and no actionable mutation on NexGen ration sequencing obtained and 2020.  She has developed stage IV disease in 2022.  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  She was found to have T4a tumor after surgical resection in October 2020.  Prior Therapy:  She is status post robotic assisted laparoscopic cystectomy and pelvic exoneration including hysterectomy, bilateral salpingo-oophorectomy and ileoconduit diversion completed on February 01, 2019.  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   Radiation therapy with weekly chemotherapy utilizing carboplatin and paclitaxel.  Week 1 of chemotherapy started on May 14, 2019.  Therapy concluded in February 2021.    Durvalumab 10 mg/kg cycle 1 on Sep 03, 2019 every 14 days.  She completed 4 cycles of therapy.   Carboplatin with Alimta and Pembrolizumab started on August 04, 2020.  She completed 3 cycles of therapy in June 2022.  Pembrolizumab 400 mg every 6 weeks started on December 22, 2020.  Therapy discontinued and March 2023 due to patient's preference.  Current therapy: Pembrolizumab 400 mg every 6 weeks restarted on February 14, 2022.  She completed 2 cycles of therapy.  Interim History: Lauren Salazar returns today for repeat evaluation.  Since the last visit, she reports no major changes in her health.  She has reported lower back pain that has been evaluated with MRI and did not show any evidence of malignancy.  She has also reported abdominal pain and discomfort related to ventral hernia and hernia around her ostomy.  He denies any  fevers, chills or sweats.  She denies any cough, wheezing or hemoptysis.    Medications: Reviewed without changes. Current Outpatient Medications  Medication Sig Dispense Refill   acetaminophen (TYLENOL) 500 MG tablet Take 1,000 mg by mouth every 8 (eight) hours as needed (pain).  (Patient not taking: Reported on 12/22/2020)     benzonatate (TESSALON) 200 MG capsule TAKE 1 CAPSULE(200 MG) BY MOUTH THREE TIMES DAILY AS NEEDED FOR COUGH (Patient not taking: Reported on 12/22/2020) 30 capsule 2   chlorpheniramine-HYDROcodone (TUSSIONEX) 10-8 MG/5ML SUER Take 5 mLs by mouth every 12 (twelve) hours as needed for cough. (Patient not taking: Reported on 08/01/8117) 147 mL 0   folic acid (FOLVITE) 1 MG tablet TAKE 1 TABLET(1 MG) BY MOUTH DAILY 90 tablet 3   gabapentin (NEURONTIN) 300 MG capsule Take 1 capsule (300 mg total) by mouth 3 (three) times daily. Take for significant back pain around the clock. (Patient not taking: Reported on 12/22/2020) 42 capsule 1   guaiFENesin-dextromethorphan (ROBITUSSIN DM) 100-10 MG/5ML syrup Take 5-10 mLs by mouth every 4 (four) hours as needed for cough. (Patient not taking: Reported on 12/22/2020) 473 mL 0   HYDROcodone-acetaminophen (NORCO) 5-325 MG tablet Take 1 tablet by mouth every 6 (six) hours as needed for moderate pain. (Patient not taking: Reported on 12/22/2020) 30 tablet 0   nicotine (NICODERM CQ - DOSED IN MG/24 HOURS) 21 mg/24hr patch Place 21 mg onto the skin daily. (Patient not taking: Reported on 12/22/2020)     prochlorperazine (COMPAZINE) 10 MG tablet Take 1 tablet (10 mg total) by mouth every 6 (six) hours as needed for  nausea or vomiting. 30 tablet 0   sucralfate (CARAFATE) 1 g tablet Dissolve 1 tablet in 10 mL H20 and swallow 30 min prior to meals and bedtime PRN heartburn. (Patient not taking: Reported on 12/22/2020) 40 tablet 2   traMADol (ULTRAM) 50 MG tablet Take 1 tablet (50 mg total) by mouth every 6 (six) hours as needed. (Patient not taking: Reported  on 12/22/2020) 30 tablet 0   No current facility-administered medications for this visit.     Allergies: No Known Allergies   Physical exam      Blood pressure (!) 152/99, pulse 61, temperature 98.1 F (36.7 C), temperature source Temporal, resp. rate 16, height 5\' 6"  (1.676 m), weight 123 lb 8 oz (56 kg), SpO2 99 %.    ECOG 1   General appearance: Alert, awake without any distress. Head: Atraumatic without abnormalities Oropharynx: Without any thrush or ulcers. Eyes: No scleral icterus. Lymph nodes: No lymphadenopathy noted in the cervical, supraclavicular, or axillary nodes Heart:regular rate and rhythm, without any murmurs or gallops.   Lung: Clear to auscultation without any rhonchi, wheezes or dullness to percussion. Abdomin: Soft, nontender without any shifting dullness or ascites. Musculoskeletal: No clubbing or cyanosis. Neurological: No motor or sensory deficits. Skin: No rashes or lesions.         Lab Results: Lab Results  Component Value Date   WBC 10.1 03/28/2022   HGB 13.3 03/28/2022   HCT 40.0 03/28/2022   MCV 91.3 03/28/2022   PLT 322 03/28/2022     Chemistry      Component Value Date/Time   NA 138 03/28/2022 1336   K 3.9 03/28/2022 1336   CL 103 03/28/2022 1336   CO2 24 03/28/2022 1336   BUN 23 03/28/2022 1336   CREATININE 0.96 03/28/2022 1336      Component Value Date/Time   CALCIUM 9.4 03/28/2022 1336   ALKPHOS 78 03/28/2022 1336   AST 16 03/28/2022 1336   ALT 9 03/28/2022 1336   BILITOT 0.3 03/28/2022 1336           Impression and Plan:  1.  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    I disease status was assessed at this time and treatment choices were reviewed.  He is currently on Pembrolizumab single agent therapy since October but has been very erratic in her follow-up and adherence to chemotherapy regimen.  She has been reluctant to undergo on staging scans which is not ideal for the  management of her cancer.  After discussion today, I have recommended to not proceed with any treatment till we update her staging scans given her symptoms.  She has reported back pain and abdominal pain which could be a sign of cancer progression despite her negative MRI in October 2023.  Different salvage therapy options would be considered pending her results of her staging.  She is agreeable to proceed with staging scans at this time and will hold off on any systemic therapy till she completes that.     2.  Goals of care and prognosis: Her disease is incurable and her prognosis is predominantly dictated by her lung cancer that is fortunately has been indolent.  Her performance status is reasonable but not a very healthy candidate for aggressive measures.     3.  Autoimmune considerations: He is complications: Pneumonitis, colitis and thyroid disease.  He has not experienced any at this time.  4.  IV access: Peripheral veins are currently in use without any  issues.   5.  Antiemetics: No nausea or vomit reported currently.  Compazine is available to her.   6.  CNS surveillance: Imaging studies in April 2023 did not show any evidence of metastatic disease  7.  Abdominal pain: She has hernia around her ostomy site.  Will refer to general surgery for evaluation.  I doubt she will be a surgical candidate.  8.  Bladder cancer: Diagnosed in 2020 and found to have T4a N0 poorly differentiated squamous cell carcinoma after surgical resection.  She did not receive any additional treatment for her cancer.   9.  Follow-up: In the next few weeks to update her staging scans and determine best course of action to treat her lung cancer.    30  minutes were spent on this visit.  The time was dedicated to updating disease status, treatment choices and outlining future plan of care review.  Eli Hose, MD 05/10/2022 12:49 PM

## 2022-05-11 ENCOUNTER — Other Ambulatory Visit: Payer: Self-pay

## 2022-05-26 ENCOUNTER — Ambulatory Visit (HOSPITAL_COMMUNITY)
Admission: RE | Admit: 2022-05-26 | Discharge: 2022-05-26 | Disposition: A | Discharge status: Home / Self Care | Payer: Medicaid Other | Source: Ambulatory Visit | Attending: Oncology | Admitting: Oncology

## 2022-05-26 ENCOUNTER — Encounter (HOSPITAL_COMMUNITY): Payer: Self-pay

## 2022-05-26 DIAGNOSIS — C3490 Malignant neoplasm of unspecified part of unspecified bronchus or lung: Secondary | ICD-10-CM | POA: Insufficient documentation

## 2022-05-26 LAB — POCT I-STAT CREATININE: Creatinine, Ser: 1.1 mg/dL — ABNORMAL HIGH (ref 0.44–1.00)

## 2022-05-26 MED ORDER — IOHEXOL 300 MG/ML  SOLN
100.0000 mL | Freq: Once | INTRAMUSCULAR | Status: AC | PRN
  Administered 2022-05-26: 100 mL via INTRAVENOUS

## 2022-06-06 ENCOUNTER — Other Ambulatory Visit: Payer: Self-pay

## 2022-06-07 ENCOUNTER — Inpatient Hospital Stay (HOSPITAL_BASED_OUTPATIENT_CLINIC_OR_DEPARTMENT_OTHER): Payer: Medicaid Other | Admitting: Hematology and Oncology

## 2022-06-07 ENCOUNTER — Other Ambulatory Visit: Payer: Self-pay | Admitting: *Deleted

## 2022-06-07 ENCOUNTER — Other Ambulatory Visit: Payer: Self-pay | Admitting: Hematology and Oncology

## 2022-06-07 ENCOUNTER — Inpatient Hospital Stay: Payer: Medicaid Other | Attending: Oncology

## 2022-06-07 ENCOUNTER — Other Ambulatory Visit: Payer: Self-pay

## 2022-06-07 VITALS — BP 140/79 | HR 55 | Temp 97.6°F | Resp 17 | Wt 121.9 lb

## 2022-06-07 DIAGNOSIS — Z90710 Acquired absence of both cervix and uterus: Secondary | ICD-10-CM | POA: Diagnosis not present

## 2022-06-07 DIAGNOSIS — C3490 Malignant neoplasm of unspecified part of unspecified bronchus or lung: Secondary | ICD-10-CM

## 2022-06-07 DIAGNOSIS — Z90722 Acquired absence of ovaries, bilateral: Secondary | ICD-10-CM | POA: Diagnosis not present

## 2022-06-07 DIAGNOSIS — F17210 Nicotine dependence, cigarettes, uncomplicated: Secondary | ICD-10-CM | POA: Insufficient documentation

## 2022-06-07 DIAGNOSIS — G893 Neoplasm related pain (acute) (chronic): Secondary | ICD-10-CM | POA: Insufficient documentation

## 2022-06-07 DIAGNOSIS — C7951 Secondary malignant neoplasm of bone: Secondary | ICD-10-CM | POA: Insufficient documentation

## 2022-06-07 DIAGNOSIS — Z79899 Other long term (current) drug therapy: Secondary | ICD-10-CM | POA: Insufficient documentation

## 2022-06-07 DIAGNOSIS — C3431 Malignant neoplasm of lower lobe, right bronchus or lung: Secondary | ICD-10-CM

## 2022-06-07 LAB — CMP (CANCER CENTER ONLY)
ALT: 10 U/L (ref 0–44)
AST: 23 U/L (ref 15–41)
Albumin: 3.5 g/dL (ref 3.5–5.0)
Alkaline Phosphatase: 89 U/L (ref 38–126)
Anion gap: 10 (ref 5–15)
BUN: 21 mg/dL (ref 8–23)
CO2: 24 mmol/L (ref 22–32)
Calcium: 9.3 mg/dL (ref 8.9–10.3)
Chloride: 103 mmol/L (ref 98–111)
Creatinine: 0.86 mg/dL (ref 0.44–1.00)
GFR, Estimated: >60 mL/min (ref >60)
Glucose, Bld: 133 mg/dL — ABNORMAL HIGH (ref 70–99)
Potassium: 4.3 mmol/L (ref 3.5–5.1)
Sodium: 137 mmol/L (ref 135–145)
Total Bilirubin: 0.3 mg/dL (ref 0.3–1.2)
Total Protein: 7.2 g/dL (ref 6.5–8.1)

## 2022-06-07 LAB — CBC WITH DIFFERENTIAL (CANCER CENTER ONLY)
Abs Immature Granulocytes: 0.07 10*3/uL (ref 0.00–0.07)
Basophils Absolute: 0.1 10*3/uL (ref 0.0–0.1)
Basophils Relative: 1 %
Eosinophils Absolute: 0.5 10*3/uL (ref 0.0–0.5)
Eosinophils Relative: 5 %
HCT: 40.2 % (ref 36.0–46.0)
Hemoglobin: 13.5 g/dL (ref 12.0–15.0)
Immature Granulocytes: 1 %
Lymphocytes Relative: 8 %
Lymphs Abs: 0.9 10*3/uL (ref 0.7–4.0)
MCH: 30.1 pg (ref 26.0–34.0)
MCHC: 33.6 g/dL (ref 30.0–36.0)
MCV: 89.5 fL (ref 80.0–100.0)
Monocytes Absolute: 1.3 10*3/uL — ABNORMAL HIGH (ref 0.1–1.0)
Monocytes Relative: 13 %
Neutro Abs: 7.3 10*3/uL (ref 1.7–7.7)
Neutrophils Relative %: 72 %
Platelet Count: 418 10*3/uL — ABNORMAL HIGH (ref 150–400)
RBC: 4.49 MIL/uL (ref 3.87–5.11)
RDW: 15.5 % (ref 11.5–15.5)
WBC Count: 10.1 10*3/uL (ref 4.0–10.5)
nRBC: 0 % (ref 0.0–0.2)

## 2022-06-07 LAB — TSH: TSH: 2.49 u[IU]/mL (ref 0.350–4.500)

## 2022-06-07 MED ORDER — OXYCODONE HCL 5 MG PO TABS: 5.0000 mg | ORAL_TABLET | Freq: Four times a day (QID) | ORAL | 0 refills | Status: DC | PRN

## 2022-06-07 NOTE — Progress Notes (Signed)
[facility] [phone]   [phone]  PROGRESS NOTE  Patient Care Team: [facility] as PCP - General (Family Medicine)  Hematological/Oncological History # Metastatic Adenosquamous Lung Cancer  05/10/2022: last visit with Dr. Alen Blew 05/26/2022: CT scan showed enlargement of RLL mass, right iliac crest mass with large soft tissue components extending in the iliacus and gluteus musculature. Additionally new pathologic right external iliac adenopathy.  06/07/2022: transition care to Dr. Lorenso Courier   Interval History:  Lauren Salazar 62 y.o. female with medical history significant for metastatic adenosquamous lung cancer presents for a follow up visit. The patient's last visit was on 05/10/2022 with Dr. Alen Blew. In the interim since the last visit she had a CT scan which shows progression of disease.   On exam today Lauren Salazar reports that she has been "wasting her life" for the last 5 years.  She reports that she is "wants to be done with it all".  She reports that she is quite miserable and wants to get "at least 1 thing fixed".  She reports that she is miserable because of pain that she is having in her abdomen as well as her umbilical hernia.  She reports that she has tried tramadol but has been ineffective for her.  She reports that chemotherapy made her feel quite ill and she did not tolerate it well.  She notes that she did fine with the immunotherapy but is very disheartened by the fact her most recent CT scan shows marked progression of disease.  Overall she seems very discontented.  She notes that she would be willing to meet with palliative care but also wants a referral to surgery.  She also wanted to consider second opinions at local academic institutions to see if there are surgical options to help remove her metastatic disease.  She currently denies any fevers, chills, sweats, nausea, vomiting or diarrhea.  After a long  and detailed discussion the patient noted that she would be willing to consider treatment with third line chemotherapy.  We discussed the risks and benefits of this line of chemotherapy and the toxicities involved.  We also discussed comfort based care as an option.  The patient noted she would like to proceed with chemotherapy at this time.  MEDICAL HISTORY:  Past Medical History:  Diagnosis Date   Bladder tumor    Cancer ([place])    Complication of anesthesia    slow to wake after spinal fusion, then was "looney" for the following 24 hours    Persistent dry cough    ongoing since feb 2020    SURGICAL HISTORY: Past Surgical History:  Procedure Laterality Date   CERVICAL FUSION  2005   c5-c6      CYSTOSCOPY WITH INJECTION N/A 02/01/2019   Procedure: CYSTOSCOPY WITH INJECTION;  Surgeon: Alexis Frock, MD;  Location: [facility];  Service: Urology;  Laterality: N/A;   LYMPHADENECTOMY Bilateral 02/01/2019   Procedure: LYMPHADENECTOMY;  Surgeon: Alexis Frock, MD;  Location: [facility];  Service: Urology;  Laterality: Bilateral;   ROBOT ASSISTED LAPAROSCOPIC COMPLETE CYSTECT ILEAL CONDUIT N/A 02/01/2019   Procedure: XI ROBOTIC ASSISTED LAPAROSCOPIC COMPLETE CYSTECTOMY, ILEAL CONDUIT, HYSTERECTOMY WITH BILATERAL SALPINGOOPHORECTOMY;  Surgeon: Alexis Frock, MD;  Location: [facility];  Service: Urology;  Laterality: N/A;  6 HRS   TRANSURETHRAL RESECTION OF BLADDER TUMOR N/A 10/31/2018   Procedure: TRANSURETHRAL RESECTION OF BLADDER TUMOR (TURBT) WITH CYSTOSCOPY/ POSSIBLE INTRAVESICAL GEMCITABINE;  Surgeon: Ceasar Mons, MD;  Location: [facility];  Service: Urology;  Laterality: N/A;    SOCIAL HISTORY: Social History   Socioeconomic History   Marital status: Divorced    Spouse name: Not on file   Number of children: Not on file   Years of education: Not on file   Highest education level: Not on file  Occupational History   Not on file  Tobacco Use   Smoking status: Every Day    Packs/day:  1.00    Types: Cigarettes   Smokeless tobacco: Never   Tobacco comments:    smoking since age 33 , she is cutting back, using patches, trying to stop. 04/02/19  Vaping Use   Vaping Use: Never used  Substance and Sexual Activity   Alcohol use: No   Drug use: No   Sexual activity: Not on file  Other Topics Concern   Not on file  Social History Narrative   Not on file   Social Determinants of Health   Financial Resource Strain: Not on file  Food Insecurity: Not on file  Transportation Needs: No Transportation Needs (04/02/2019)   PRAPARE - Hydrologist (Medical): No    Lack of Transportation (Non-Medical): No  Physical Activity: Not on file  Stress: Not on file  Social Connections: Not on file  Intimate Partner Violence: Not At Risk (04/02/2019)   Humiliation, Afraid, Rape, and Kick questionnaire    Fear of Current or Ex-Partner: No    Emotionally Abused: No    Physically Abused: No    Sexually Abused: No    FAMILY HISTORY: No family history on file.  ALLERGIES:  has No Known Allergies.  MEDICATIONS:  Current Outpatient Medications  Medication Sig Dispense Refill   oxyCODONE (OXY IR/ROXICODONE) 5 MG immediate release tablet Take 1 tablet (5 mg total) by mouth every 6 (six) hours as needed for severe pain. 30 tablet 0   acetaminophen (TYLENOL) 500 MG tablet Take 1,000 mg by mouth every 8 (eight) hours as needed (pain).  (Patient not taking: Reported on 12/22/2020)     benzonatate (TESSALON) 200 MG capsule TAKE 1 CAPSULE(200 MG) BY MOUTH THREE TIMES DAILY AS NEEDED FOR COUGH (Patient not taking: Reported on 12/22/2020) 30 capsule 2   chlorpheniramine-HYDROcodone (TUSSIONEX) 10-8 MG/5ML SUER Take 5 mLs by mouth every 12 (twelve) hours as needed for cough. (Patient not taking: Reported on 1/61/0960) 454 mL 0   folic acid (FOLVITE) 1 MG tablet TAKE 1 TABLET(1 MG) BY MOUTH DAILY 90 tablet 3   gabapentin (NEURONTIN) 300 MG capsule Take 1 capsule (300 mg  total) by mouth 3 (three) times daily. Take for significant back pain around the clock. (Patient not taking: Reported on 12/22/2020) 42 capsule 1   guaiFENesin-dextromethorphan (ROBITUSSIN DM) 100-10 MG/5ML syrup Take 5-10 mLs by mouth every 4 (four) hours as needed for cough. (Patient not taking: Reported on 12/22/2020) 473 mL 0   nicotine (NICODERM CQ - DOSED IN MG/24 HOURS) 21 mg/24hr patch Place 21 mg onto the skin daily. (Patient not taking: Reported on 12/22/2020)     prochlorperazine (COMPAZINE) 10 MG tablet Take 1 tablet (10 mg total) by mouth every 6 (six) hours as needed for nausea or vomiting. 30 tablet 0   sucralfate (CARAFATE) 1 g tablet Dissolve 1 tablet in 10 mL H20 and swallow 30 min prior to meals and bedtime PRN heartburn. (Patient not taking: Reported on 12/22/2020) 40 tablet 2   No current facility-administered medications for this visit.    REVIEW OF SYSTEMS:  Constitutional: ( - ) fevers, ( - )  chills , ( - ) night sweats Eyes: ( - ) blurriness of vision, ( - ) double vision, ( - ) watery eyes Ears, nose, mouth, throat, and face: ( - ) mucositis, ( - ) sore throat Respiratory: ( - ) cough, ( - ) dyspnea, ( - ) wheezes Cardiovascular: ( - ) palpitation, ( - ) chest discomfort, ( - ) lower extremity swelling Gastrointestinal:  ( - ) nausea, ( - ) heartburn, ( - ) change in bowel habits Skin: ( - ) abnormal skin rashes Lymphatics: ( - ) new lymphadenopathy, ( - ) easy bruising Neurological: ( - ) numbness, ( - ) tingling, ( - ) new weaknesses Behavioral/Psych: ( - ) mood change, ( - ) new changes  All other systems were reviewed with the patient and are negative.  PHYSICAL EXAMINATION: ECOG PERFORMANCE STATUS: 1 - Symptomatic but completely ambulatory  Vitals:   06/07/22 1131  BP: (!) 140/79  Pulse: (!) 55  Resp: 17  Temp: 97.6 F (36.4 C)  SpO2: 98%   Filed Weights   06/07/22 1131  Weight: 121 lb 14.4 oz (55.3 kg)    GENERAL: Chronically ill-appearing  middle-aged Caucasian female, alert, no distress and comfortable SKIN: skin color, texture, turgor are normal, no rashes or significant lesions EYES: conjunctiva are pink and non-injected, sclera clear LUNGS: clear to auscultation and percussion with normal breathing effort HEART: regular rate & rhythm and no murmurs and no lower extremity edema Musculoskeletal: no cyanosis of digits and no clubbing  PSYCH: alert & oriented x 3, fluent speech NEURO: no focal motor/sensory deficits  LABORATORY DATA:  I have reviewed the data as listed    Latest Ref Rng & Units 06/07/2022   11:14 AM 03/28/2022    1:36 PM 02/14/2022    2:29 PM  CBC  WBC 4.0 - 10.5 K/uL 10.1  10.1  7.7   Hemoglobin 12.0 - 15.0 g/dL 13.5  13.3  14.0   Hematocrit 36.0 - 46.0 % 40.2  40.0  41.9   Platelets 150 - 400 K/uL 418  322  286        Latest Ref Rng & Units 06/07/2022   11:14 AM 05/26/2022    3:10 PM 03/28/2022    1:36 PM  CMP  Glucose 70 - 99 mg/dL 133   174   BUN 8 - 23 mg/dL 21   23   Creatinine 0.44 - 1.00 mg/dL 0.86  1.10  0.96   Sodium 135 - 145 mmol/L 137   138   Potassium 3.5 - 5.1 mmol/L 4.3   3.9   Chloride 98 - 111 mmol/L 103   103   CO2 22 - 32 mmol/L 24   24   Calcium 8.9 - 10.3 mg/dL 9.3   9.4   Total Protein 6.5 - 8.1 g/dL 7.2   7.2   Total Bilirubin 0.3 - 1.2 mg/dL 0.3   0.3   Alkaline Phos 38 - 126 U/L 89   78   AST 15 - 41 U/L 23   16   ALT 0 - 44 U/L 10   9     RADIOGRAPHIC STUDIES: CT CHEST ABDOMEN PELVIS W CONTRAST  Result Date: 05/27/2022 CLINICAL DATA:  Non-small cell lung cancer restaging * Tracking Code: BO * EXAM: CT CHEST, ABDOMEN, AND PELVIS WITH CONTRAST TECHNIQUE: Multidetector CT imaging of the chest, abdomen and pelvis was performed following the standard protocol during bolus  administration of intravenous contrast. RADIATION DOSE REDUCTION: This exam was performed according to the departmental dose-optimization program which includes automated exposure control, adjustment of  the mA and/or kV according to patient size and/or use of iterative reconstruction technique. CONTRAST:  110mL OMNIPAQUE IOHEXOL 300 MG/ML  SOLN COMPARISON:  08/16/2021 FINDINGS: CT CHEST FINDINGS Cardiovascular: Atherosclerotic calcification of the aortic arch. Mediastinum/Nodes: Unremarkable Lungs/Pleura: Further enlargement of the right lower lobe mass, currently 5.9 by 4.9 by 5.1 cm (volume = 77 cm^3). This previously measured 4.7 by 4.4 by 5.1 cm (volume = 55 cm^3) when measured in the same fashion. Trace right pleural effusion similar to prior. Centrilobular emphysema. Left apical pleuroparenchymal scarring. Increased atelectasis in the posterior basal segment left lower lobe. Musculoskeletal: Lower cervical plate and screw fixator. CT ABDOMEN PELVIS FINDINGS Hepatobiliary: Contracted gallbladder.  Otherwise unremarkable. Pancreas: Unremarkable Spleen: Unremarkable Adrenals/Urinary Tract: Stable bilateral adrenal adenomas. Peripheral scarring in the right kidney is unchanged. Cystectomy with ileal conduit. Stomach/Bowel: Sigmoid colon diverticulosis. Supraumbilical hernia contains a loop of small bowel which has associated postoperative findings. The previous herniation of transverse colon is no longer present. The hernia neck is widely patent in their no signs of complicating features such as ischemia. Vascular/Lymphatic: Atherosclerosis is present, including aortoiliac atherosclerotic disease. Suspected occluded or near occluded segment of the proximal right common iliac artery, unchanged. Subsequent reconstitution after a 1.5 cm segment. New pathologic right external iliac adenopathy, index node 1.2 cm in short axis on image 104 series 2, formerly 0.4 cm. Reproductive: Uterus absent.  Adnexa unremarkable. Other: No supplemental non-categorized findings. Musculoskeletal: New 10.5 by 10.2 by 8.2 cm (volume = 460 cm^3) lytic destructive mass of the right iliac crest with large soft tissue components extending  in the iliacus and gluteus musculature and tracking up along the right posterior lateral abdominal wall musculature, image 91 series 2. No direct impingement on the right sacral plexus or right sciatic nerve. IMPRESSION: 1. Further enlargement of the right lower lobe mass, currently 77 cubic cm in volume, previously 55 cubic cm. 2. New 460 cc lytic destructive mass of the right iliac crest with large soft tissue components extending in the iliacus and gluteus musculature and tracking up along the right posterior lateral abdominal wall musculature. 3. New pathologic right external iliac adenopathy. 4. 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 5. Stable trace right pleural effusion. 6. Stable bilateral adrenal adenomas. 7. Supraumbilical hernia contains a loop of small bowel which has associated postoperative findings. No complicating feature. The previous herniation of transverse colon is no longer present. 8. Sigmoid colon diverticulosis. 9. Cystectomy with ileal conduit. 10. Aortic atherosclerosis. 11. Emphysema. Aortic Atherosclerosis (ICD10-I70.0) and Emphysema (ICD10-J43.9). Electronically Signed   By: Van Clines M.D.   On: 05/27/2022 09:35    ASSESSMENT & PLAN Lauren Salazar 62 y.o. female with medical history significant for metastatic adenosquamous lung cancer presents for a follow up visit.  She is status post robotic assisted laparoscopic cystectomy and pelvic exoneration including hysterectomy, bilateral salpingo-oophorectomy and ileoconduit diversion completed on February 01, 2019.  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   Radiation therapy with weekly chemotherapy utilizing carboplatin and paclitaxel.  Week 1 of chemotherapy started on May 14, 2019.  Therapy concluded in February 2021.    Durvalumab 10 mg/kg cycle 1 on Sep 03, 2019 every 14 days.  She  completed 4 cycles of therapy.  Carboplatin with Alimta and Pembrolizumab started on August 04, 2020.  She completed 3 cycles of therapy in June 2022.   Pembrolizumab 400 mg every 6 weeks started on December 22, 2020.  Therapy discontinued and March 2023 due to patient's preference.  Progression noted on CT scan from 05/26/2022.   # Metastatic Adenosquamous Carcinoma of the Lung  --patient has undergone numerous lines of treatment including chemoradiation followed by immunotherapy, subsequent carbo/pem/pem and then monotherapy pembrolizumab --progression of disease noted on 05/26/2022 CT scan. --patient notes she does is considering stopping therapy but wants to try another round of chemotherapy.  -- Plan to proceed with docetaxel plus ramucircumab q 3 weeks  --will make referrals to radiation oncology (for possible palliative radiation to her bone lesion) and palliative care.  --patient requesting referral to [place] surgery for her supraumbilical hernia. I told her I did not think this was the source of her discomfort and that surgeons would not likely want to operate with her metastatic cancer diagnosis. She would still like evaluation --patient is considering second opinion. We can make a referral to [facility] or [facility] at her request.  --RTC for start of Docetaxel/Ramucircumab.   # Abdominal Pain/ Cancer Related Pain --will provide patient with oxycodone prescription  --referral to palliative care and rad/onc   #Supportive Care -- chemotherapy education not required.  -- port placement not needed -- zofran 8mg  q8H PRN and compazine 10mg  PO q6H for nausea --pain medication as above.     Orders Placed This Encounter  Procedures   CBC with Differential ([facility] Only)    Standing Status:   Future    Standing Expiration Date:   06/22/2023   CMP ([facility] only)    Standing Status:   Future    Standing Expiration Date:   06/22/2023   T4    Standing Status:   Future     Standing Expiration Date:   06/22/2023   TSH    Standing Status:   Future    Standing Expiration Date:   06/22/2023   Total Protein, Urine dipstick    Standing Status:   Future    Standing Expiration Date:   06/22/2023   CBC with Differential ([place] Only)    Standing Status:   Future    Standing Expiration Date:   07/13/2023   CMP ([place] only)    Standing Status:   Future    Standing Expiration Date:   07/13/2023    All questions were answered. The patient knows to call the clinic with any problems, questions or concerns.  A total of more than 40 minutes were spent on this encounter with face-to-face time and non-face-to-face time, including preparing to see the patient, ordering tests and/or medications, counseling the patient and coordination of care as outlined above.   Ledell Peoples, MD Department of Hematology/Oncology [place] [facility] Phone: [phone] Pager: [phone] Email: [email or web address]  06/07/2022 3:08 PM

## 2022-06-07 NOTE — Progress Notes (Signed)
DISCONTINUE OFF PATHWAY REGIMEN - Non-Small Cell Lung   [id number]:Pembrolizumab 200 mg  IV D1 + Pemetrexed 500 mg/m2 IV D1 + Carboplatin AUC=5 IV D1 q21 Days:   A cycle is every 21 days:     Pembrolizumab      Pemetrexed      Carboplatin   **Always confirm dose/schedule in your pharmacy ordering system**  REASON: Disease Progression PRIOR TREATMENT: Off Pathway: Pembrolizumab 200 mg  IV D1 + Pemetrexed 500 mg/m2 IV D1 + Carboplatin AUC=5 IV D1 q21 Days TREATMENT RESPONSE: Partial Response (PR)  START ON PATHWAY REGIMEN - Non-Small Cell Lung     A cycle is every 21 days:     Ramucirumab      Docetaxel   **Always confirm dose/schedule in your pharmacy ordering system**  Patient Characteristics: Stage IV Metastatic, Nonsquamous, Molecular Analysis Completed, Molecular Alteration Present and Targeted Therapy Exhausted OR EGFR Exon 20+ or KRAS G12C+ or HER2+ Present and No Prior Chemo/Immunotherapy OR No Alteration Present, Third Line -  Chemotherapy/Immunotherapy, PS = 0, 1 Therapeutic Status: Stage IV Metastatic Histology: Nonsquamous Cell Broad Molecular Profiling Status: Engineer, manufacturing Analysis Results: No Alteration Present ECOG Performance Status: 1 Chemotherapy/Immunotherapy Line of Therapy: Third Line Chemotherapy/Immunotherapy Intent of Therapy: Non-Curative / Palliative Intent, Discussed with Patient

## 2022-06-08 ENCOUNTER — Telehealth: Payer: Self-pay | Admitting: Radiation Oncology

## 2022-06-08 ENCOUNTER — Other Ambulatory Visit: Payer: Self-pay

## 2022-06-08 NOTE — Telephone Encounter (Signed)
Left message for patient to call back to schedule consult per 2/13 referral.

## 2022-06-09 ENCOUNTER — Telehealth: Payer: Self-pay | Admitting: Radiation Oncology

## 2022-06-09 NOTE — Telephone Encounter (Signed)
Attempted to schedule consult per 2/13 referral. Patient stated she was told by Dr. Alen Blew that she was maxed out on the amount of radiation she could receive and was unaware of the referral and reasoning. Sent message to provider with update.

## 2022-06-09 NOTE — Telephone Encounter (Signed)
Left message for patient to call back to schedule consult per 2/13 referral.

## 2022-06-10 ENCOUNTER — Telehealth: Payer: Self-pay

## 2022-06-10 ENCOUNTER — Telehealth: Payer: Self-pay | Admitting: Hematology and Oncology

## 2022-06-10 NOTE — Telephone Encounter (Signed)
Call placed to patient to make aware that she could receive radiation to right hip. Patient agrees to come in for consultation with Dr. Isidore Moos. Patient requesting visit to be on a Monday or Tuesday after 11am.

## 2022-06-10 NOTE — Telephone Encounter (Signed)
Per 2/16 IB reached out to schedule patient appointment, patient sleeping will call back after 11am.

## 2022-06-10 NOTE — Telephone Encounter (Signed)
Two calls placed to speak with patient about radiation to right knee. No answer, voicemail left requesting call back.

## 2022-06-10 NOTE — Telephone Encounter (Signed)
I meant to type hip, will make addendum. Thanks

## 2022-06-15 ENCOUNTER — Inpatient Hospital Stay: Payer: Medicaid Other | Admitting: Nurse Practitioner

## 2022-06-16 NOTE — Progress Notes (Signed)
Histology and Location of Primary Cancer:  Metastatic Adenosquamous Lung Cancer   Sites of Visceral and Bony Metastatic Disease:  CT C/A/P w/ Contrast 05/26/2022 --IMPRESSION: Further enlargement of the right lower lobe mass, currently 77 cubic cm in volume, previously 55 cubic cm. New 460 cc lytic destructive mass of the right iliac crest with large soft tissue components extending in the iliacus and gluteus musculature and tracking up along the right posterior lateral abdominal wall musculature. New pathologic right external iliac adenopathy. 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 Stable trace right pleural effusion. Stable bilateral adrenal adenomas. Supraumbilical hernia contains a loop of small bowel which has associated postoperative findings. No complicating feature. The previous herniation of transverse colon is no longer present. Sigmoid colon diverticulosis. Cystectomy with ileal conduit. Aortic atherosclerosis. Emphysema.  Location(s) of Symptomatic Metastases:  Right hip  Past/Anticipated chemotherapy by medical oncology, if any:  Under care of Dr. Narda Rutherford  06/07/2022 patient has undergone numerous lines of treatment including chemoradiation followed by immunotherapy, subsequent carbo/pem/pem and then monotherapy pembrolizumab progression of disease noted on 05/26/2022 CT scan. patient notes she does is considering stopping therapy but wants to try another round of chemotherapy.  Plan to proceed with docetaxel plus ramucircumab q 3 weeks  will make referrals to radiation oncology (for possible palliative radiation to her bone lesion) and palliative care.  patient requesting referral to [place] surgery for her supraumbilical hernia.  I told her I did not think this was the source of her discomfort and that surgeons would not likely want to operate with her metastatic cancer  diagnosis.  She would still like evaluation patient is considering second opinion.  We can make a referral to [facility] or [facility] at her request.  RTC for start of Docetaxel/Ramucircumab.   Pain on a scale of 0-10 is: Reports right hip pain is 10 out of 10. States laying on her back with  her legs up will alleviate the pressure/discomfort some, but overall nothing seems to lessen the pain.   Met(s), symptoms, if any, include: Bowel/Bladder retention or incontinence (please describe): Reports occasional constipation, but states it usually resolves on its own without complication. Has a urinary drainage bag after bladder resection in 2020 Numbness or weakness in extremities (please describe): Reports her whole right leg (thigh to ankle) has felt weak for the past year, and that her left upper thigh has started to feel similarly within the past few days. She often has difficulty moving/lifting her legs, and lately has had to use her arms to physically raise her legs to get into bed to lie down.  Current Decadron regimen, if applicable: Not currently prescribed  Ambulatory status? Walker? Wheelchair?: Occasionally uses an assistive device to help her ambulate  SAFETY ISSUES: Prior radiation? Yes: 05/06/2019 through 06/26/2019 Site Technique Total Dose (Gy) Dose per Fx (Gy) Completed Fx Beam Energies  Lung, Right: Lung_Rt 3D 66/66 2 33/33 6X, 10X   Pacemaker/ICD? No Possible current pregnancy? No--postmenopausal Is the patient on methotrexate? No  Current Complaints / other details:  Meeting with Palliative care on 06/21/2022. Was contacted by [facility] regarding surgery, but was told they did not accept her insurance (so she would have to pay out of pocket for procedure)

## 2022-06-16 NOTE — Progress Notes (Signed)
Radiation Oncology         [phone] ________________________________  Outpatient Re-Consultation by telephone.  The patient opted for telemedicine to maximize safety during the pandemic.  MyChart video was not obtainable.   Name: Lauren Salazar MRN: [id number]  Date: 06/17/2022  DOB: [birth year]  [id number], [facility] @ [address], Verita Lamb, MD   REFERRING PHYSICIAN: Orson Slick, MD  DIAGNOSIS:    ICD-10-CM   1. 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 female who presents today for consideration of radiation therapy as part of management for her recent progression of metastatic lung cancer involving the right iliac crest. The patient was lost to follow-up after completing radiation to the right lung in March 2021. She then completed 4 cycles of chemotherapy consisting of  Durvalumab in June 2021 under Dr. Alen Blew. After completing treatment, the patient continued under surveillance with Dr. Alen Blew for the remainder of 2021.   However, follow-up CT CAP performed on 06/29/2020 showed evidence of disease recurrence, demonstrated by a significant interval increase in size of the mass-like  consolidation in the superior RLL. CT also showed evidence of developing radiation fibrosis in the perihilar right lung. Otherwise, there was no evidence of lymphadenopathy or metastatic disease within the chest, abdomen, or pelvis.  Subsequently, the patient was underwent another round of chemotherapy consisting of Carboplatin, Alimta and Pembrolizumab x 3 cycles from 08/04/20 through June 2022. She tolerated systemic treatment well overall other than generalized fatigue, nausea and overall muscle pain and weakness. This was followed by Pembrolizumab delivered every 6 weeks.  CT CAP on 12/21/20 showed: mild interval enlargement of the right lower lobe mass, a stable small amount of adjacent pleural fluid; a progressive healing response in the right eighth rib fracture, and radiation changes in the right lung.  CT CAP on 08/16/21 showed further interval progression of the RLL mass, measuring 5.1 x 4.4 x 4.6 cm, previously 4.9 x 2.6 x 4.3 cm, raising concern for recurrent disease. CT also showed a stable appearing subcarinal lymph node, and no evidence of metastatic disease in the abdomen or pelvis.   MRI of the brain also performed on 08/16/21 showed no evidence of intracranial metastatic disease.   After reviewing CT results with Dr. Alen Blew on 08/19/21, the patient opted to take a break from Pembrolizumab. She later resumed treatment on 02/14/22. Per encounter notes, the patient did not adhere to keeping her infusion visits in the following months. She also declined follow-up imaging studies on multiple occasions.   Around the time that she resumed treatment, the patient noticed new right lower back pain and right sided abdominal pain. Her symptoms prompted an MRI of the  lumbar spine on 02/12/22 which showed no definite evidence of osseous metastatic disease. Some mild degenerative changes of the lumbar spine were appreciated, but without any high-grade spinal canal or neural foraminal stenosis at any  level.  Her back pain continued to persist into 2024. In light of this, Dr. Alen Blew recommended holding treatment and obtaining staging scans to rule out disease progression.     Subsequent CT CAP on 05/26/22 demonstrated: further enlargement of the RLL mass; a new 460 cc lytic destructive mass of the right iliac crest with large soft tissue components extending in the iliacus and gluteus musculature, and tracking up along the right posterior lateral abdominal wall musculature; and new pathologic right external iliac adenopathy. CT also showed a stable trace right pleural effusion, a supraumbilical hernia containing a loop of small bowel with associated postoperative findings, and stable occlusion or a near occluded segment of the proximal right common iliac artery, with reconstitution after a 1.5 cm segment.  Accordingly, the patient followed up with Dr. Alen Blew on 06/07/22 to review CT findings. During this visit, the patient was noted to report feeling miserable with regards to her abdominal/back pain and emotional state, and very disheartened by the news of her disease progression. After a very detailed discussion with Dr. Alen Blew, the patient is willing to consider third line chemotherapy. Systemic treatment will consist of docetaxel plus ramucircumab q 3 weeks. For the new right iliac crest mass, Dr. Alen Blew recommends palliative radiation therapy which we will discuss in detail today. The patient has also requested a referral to [place] surgery for her supraumbilical hernia. Although Dr. Alen Blew has informed her that this is not likely the source of her discomfort and that surgeons would not likely want to operate with her metastatic cancer diagnosis, she would still like to be evaluated.  Pain on a scale of 0-10 is: Reports right hip pain is 10 out of 10. States laying on her back with  her legs up will alleviate the pressure/discomfort some, but overall nothing seems to lessen the pain.     Met(s), symptoms, if any, include: Bowel/Bladder retention or incontinence (please describe): Reports occasional constipation, but states it usually resolves on its own without complication. Has a urinary drainage bag after bladder resection in 2020 Numbness or weakness in extremities (please describe): Reports her whole right leg (thigh to ankle) has felt weak for the past year, and that her left upper thigh has started to feel similarly within the past few days. She often has difficulty moving/lifting her legs, and lately has had to use her arms to physically raise her legs to get into bed to lie down.  Current Decadron regimen, if applicable: Not currently prescribed   Ambulatory status? Walker? Wheelchair?: Occasionally uses an assistive device to help her ambulate  Reports food insecurity. Delaying chemotherapy until 3-19 due to pain, weakness, fatigue, other socioeconomic challenges.  PREVIOUS RADIATION THERAPY: Yes   Intent: Curative  Radiation Treatment Dates: 05/06/2019 through 06/26/2019 Site Technique Total Dose (Gy) Dose per Fx (Gy) Completed Fx Beam Energies  Lung, Right: Lung_Rt 3D 66/66 2 33/33 6X, 10X    PAST MEDICAL HISTORY:  has a past medical history of Bladder tumor, Cancer ([place]), Complication of anesthesia, and Persistent dry cough.    PAST SURGICAL HISTORY: Past Surgical History:  Procedure Laterality Date   CERVICAL FUSION  2005   c5-c6      CYSTOSCOPY WITH INJECTION N/A 02/01/2019   Procedure: CYSTOSCOPY WITH INJECTION;  Surgeon: Alexis Frock, MD;  Location: [facility];  Service: Urology;  Laterality: N/A;   LYMPHADENECTOMY Bilateral 02/01/2019   Procedure: LYMPHADENECTOMY;  Surgeon: Alexis Frock, MD;  Location: [facility];  Service: Urology;  Laterality: Bilateral;   ROBOT ASSISTED LAPAROSCOPIC COMPLETE CYSTECT ILEAL CONDUIT N/A 02/01/2019   Procedure: XI ROBOTIC ASSISTED LAPAROSCOPIC COMPLETE CYSTECTOMY, ILEAL CONDUIT, HYSTERECTOMY WITH BILATERAL SALPINGOOPHORECTOMY;   Surgeon: Alexis Frock, MD;  Location: [facility];  Service: Urology;  Laterality: N/A;  6 HRS   TRANSURETHRAL RESECTION OF BLADDER TUMOR N/A 10/31/2018   Procedure: TRANSURETHRAL RESECTION OF BLADDER TUMOR (TURBT) WITH CYSTOSCOPY/ POSSIBLE INTRAVESICAL GEMCITABINE;  Surgeon: Ceasar Mons, MD;  Location: [facility];  Service: Urology;  Laterality: N/A;    FAMILY HISTORY: family history is not on file.  SOCIAL HISTORY:  reports that she has been smoking cigarettes. She has been smoking an average of 1 pack per day. She has never used smokeless tobacco. She reports that she does not drink alcohol and does not use drugs.  ALLERGIES: Patient has no known allergies.  MEDICATIONS:  Current Outpatient Medications  Medication Sig Dispense Refill   acetaminophen (TYLENOL) 500 MG tablet Take 1,000 mg by mouth every 8 (eight) hours as needed (pain).  (Patient not taking: Reported on 12/22/2020)     benzonatate (TESSALON) 200 MG capsule TAKE 1 CAPSULE(200 MG) BY MOUTH THREE TIMES DAILY AS NEEDED FOR COUGH (Patient not taking: Reported on 12/22/2020) 30 capsule 2   chlorpheniramine-HYDROcodone (TUSSIONEX) 10-8 MG/5ML SUER Take 5 mLs by mouth every 12 (twelve) hours as needed for cough. (Patient not taking: Reported on [id number]) [id number] mL 0   folic acid (FOLVITE) 1 MG tablet TAKE 1 TABLET(1 MG) BY MOUTH DAILY 90 tablet 3   gabapentin (NEURONTIN) 300 MG capsule Take 1 capsule (300 mg total) by mouth 3 (three) times daily. Take for significant back pain around the clock. (Patient not taking: Reported on 12/22/2020) 42 capsule 1   guaiFENesin-dextromethorphan (ROBITUSSIN DM) 100-10 MG/5ML syrup Take 5-10 mLs by mouth every 4 (four) hours as needed for cough. (Patient not taking: Reported on 12/22/2020) 473 mL 0   nicotine (NICODERM CQ - DOSED IN MG/24 HOURS) 21 mg/24hr patch Place 21 mg onto the skin daily. (Patient not taking: Reported on 12/22/2020)     oxyCODONE (OXY IR/ROXICODONE) 5 MG immediate release  tablet Take 1 tablet (5 mg total) by mouth every 6 (six) hours as needed for severe pain. 30 tablet 0   prochlorperazine (COMPAZINE) 10 MG tablet Take 1 tablet (10 mg total) by mouth every 6 (six) hours as needed for nausea or vomiting. 30 tablet 0   sucralfate (CARAFATE) 1 g tablet Dissolve 1 tablet in 10 mL H20 and swallow 30 min prior to meals and bedtime PRN heartburn. (Patient not taking: Reported on 12/22/2020) 40 tablet 2   No current facility-administered medications for this encounter.    REVIEW OF SYSTEMS:  Notable for that above.   PHYSICAL EXAM:  vitals were not taken for this visit.   General: Alert and oriented, in no acute distress     LABORATORY DATA:  Lab Results  Component Value Date   WBC 10.1 06/07/2022   HGB 13.5 06/07/2022   HCT 40.2 06/07/2022   MCV 89.5 06/07/2022   PLT 418 (H) 06/07/2022   CMP     Component Value Date/Time   NA 137 06/07/2022 1114   K 4.3 06/07/2022 1114   CL 103 06/07/2022 1114   CO2 24 06/07/2022 1114   GLUCOSE 133 (H) 06/07/2022 1114  BUN 21 06/07/2022 1114   CREATININE 0.86 06/07/2022 1114   CALCIUM 9.3 06/07/2022 1114   PROT 7.2 06/07/2022 1114   ALBUMIN 3.5 06/07/2022 1114   AST 23 06/07/2022 1114   ALT 10 06/07/2022 1114   ALKPHOS 89 06/07/2022 1114   BILITOT 0.3 06/07/2022 1114   GFRNONAA >60 06/07/2022 1114   GFRAA >60 11/26/2019 1311         RADIOGRAPHY: CT CHEST ABDOMEN PELVIS W CONTRAST  Result Date: 05/27/2022 CLINICAL DATA:  Non-small cell lung cancer restaging * Tracking Code: BO * EXAM: CT CHEST, ABDOMEN, AND PELVIS WITH CONTRAST TECHNIQUE: Multidetector CT imaging of the chest, abdomen and pelvis was performed following the standard protocol during bolus administration of intravenous contrast. RADIATION DOSE REDUCTION: This exam was performed according to the departmental dose-optimization program which includes automated exposure control, adjustment of the mA and/or kV according to patient size and/or use of  iterative reconstruction technique. CONTRAST:  175m OMNIPAQUE IOHEXOL 300 MG/ML  SOLN COMPARISON:  08/16/2021 FINDINGS: CT CHEST FINDINGS Cardiovascular: Atherosclerotic calcification of the aortic arch. Mediastinum/Nodes: Unremarkable Lungs/Pleura: Further enlargement of the right lower lobe mass, currently 5.9 by 4.9 by 5.1 cm (volume = 77 cm^3). This previously measured 4.7 by 4.4 by 5.1 cm (volume = 55 cm^3) when measured in the same fashion. Trace right pleural effusion similar to prior. Centrilobular emphysema. Left apical pleuroparenchymal scarring. Increased atelectasis in the posterior basal segment left lower lobe. Musculoskeletal: Lower cervical plate and screw fixator. CT ABDOMEN PELVIS FINDINGS Hepatobiliary: Contracted gallbladder.  Otherwise unremarkable. Pancreas: Unremarkable Spleen: Unremarkable Adrenals/Urinary Tract: Stable bilateral adrenal adenomas. Peripheral scarring in the right kidney is unchanged. Cystectomy with ileal conduit. Stomach/Bowel: Sigmoid colon diverticulosis. Supraumbilical hernia contains a loop of small bowel which has associated postoperative findings. The previous herniation of transverse colon is no longer present. The hernia neck is widely patent in their no signs of complicating features such as ischemia. Vascular/Lymphatic: Atherosclerosis is present, including aortoiliac atherosclerotic disease. Suspected occluded or near occluded segment of the proximal right common iliac artery, unchanged. Subsequent reconstitution after a 1.5 cm segment. New pathologic right external iliac adenopathy, index node 1.2 cm in short axis on image 104 series 2, formerly 0.4 cm. Reproductive: Uterus absent.  Adnexa unremarkable. Other: No supplemental non-categorized findings. Musculoskeletal: New 10.5 by 10.2 by 8.2 cm (volume = 460 cm^3) lytic destructive mass of the right iliac crest with large soft tissue components extending in the iliacus and gluteus musculature and tracking up  along the right posterior lateral abdominal wall musculature, image 91 series 2. No direct impingement on the right sacral plexus or right sciatic nerve. IMPRESSION: 1. Further enlargement of the right lower lobe mass, currently 77 cubic cm in volume, previously 55 cubic cm. 2. New 460 cc lytic destructive mass of the right iliac crest with large soft tissue components extending in the iliacus and gluteus musculature and tracking up along the right posterior lateral abdominal wall musculature. 3. New pathologic right external iliac adenopathy. 4. 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 5. Stable trace right pleural effusion. 6. Stable bilateral adrenal adenomas. 7. Supraumbilical hernia contains a loop of small bowel which has associated postoperative findings. No complicating feature. The previous herniation of transverse colon is no longer present. 8. Sigmoid colon diverticulosis. 9. Cystectomy with ileal conduit. 10. Aortic atherosclerosis. 11. Emphysema. Aortic Atherosclerosis (ICD10-I70.0) and Emphysema (ICD10-J43.9). Electronically Signed   By: WVan Clines  M.D.   On: 05/27/2022 09:35      IMPRESSION/PLAN: This is a very pleasant 62 year old woman with metastatic disease to her right ilium with significant associated pain.  Socioeconomic challenges: referral social work, will arrange for nonperishable food to be given to her today.  Weakness, R>L leg with severe right hip pain. We will plan palliative radiation to her right ilium today and start the treatment on Monday given the severity of her pain.  I am not sure why she is having some weakness in her left leg after looking at her MRI and CT imaging.  This could possibly be due to deconditioning.  Will continue to monitor that and she will let her providers know if this gets worse.  She understands that the palliative treatment to her right hip will be given  over 2 weeks, 10 fractions in total.  The most common side effects of this treatment are fatigue and skin irritation.  GI upset is possible due to proximal bowel.  I will take special care to minimize exposure to her right kidney.  No guarantees of treatment were given and she understands that the potential benefits outweigh the potential risks  Referral to palliative care.  She is canceling her appointment with them next week due to other commitments but we will see if they can visit with her on another day close to her treatment time  This encounter was provided by telemedicine platform; patient desired telemedicine during pandemic precautions.  MyChart video was not available and therefore telephone was used. The patient has given verbal consent for this type of encounter and has been advised to only accept a meeting of this type in a secure network environment. On date of service, in total, I spent 40 minutes on this encounter.   The attendants for this meeting include [name]  and Rosezena Sensor During the encounter, [name] was located at [facility] Radiation Oncology Department.  Lauren Salazar was located at home.      __________________________________________   [name], MD  This document serves as a record of services personally performed by [name], MD. It was created on her behalf by Roney Mans, a trained medical scribe. The creation of this record is based on the scribe's personal observations and the provider's statements to them. This document has been checked and approved by the attending provider.

## 2022-06-17 ENCOUNTER — Inpatient Hospital Stay: Payer: Medicaid Other | Admitting: Licensed Clinical Social Worker

## 2022-06-17 ENCOUNTER — Ambulatory Visit
Admission: RE | Admit: 2022-06-17 | Discharge: 2022-06-17 | Disposition: A | Discharge status: Home / Self Care | Payer: Medicaid Other | Source: Ambulatory Visit | Attending: Radiation Oncology | Admitting: Radiation Oncology

## 2022-06-17 ENCOUNTER — Telehealth: Payer: Self-pay | Admitting: Hematology and Oncology

## 2022-06-17 ENCOUNTER — Encounter: Payer: Self-pay | Admitting: Radiation Oncology

## 2022-06-17 ENCOUNTER — Ambulatory Visit: Payer: Medicaid Other

## 2022-06-17 DIAGNOSIS — C7951 Secondary malignant neoplasm of bone: Secondary | ICD-10-CM | POA: Diagnosis not present

## 2022-06-17 DIAGNOSIS — Z510 Encounter for antineoplastic radiation therapy: Secondary | ICD-10-CM | POA: Insufficient documentation

## 2022-06-17 DIAGNOSIS — C3431 Malignant neoplasm of lower lobe, right bronchus or lung: Secondary | ICD-10-CM | POA: Insufficient documentation

## 2022-06-17 DIAGNOSIS — C3490 Malignant neoplasm of unspecified part of unspecified bronchus or lung: Secondary | ICD-10-CM

## 2022-06-17 DIAGNOSIS — I700 Atherosclerosis of aorta: Secondary | ICD-10-CM

## 2022-06-17 NOTE — Progress Notes (Signed)
[place] CSW Progress Note  Holiday representative  received a referral from radiation to see pt regarding financial concerns.  Pt was diagnosed in 2021 and has been seeing Dr. Alen Blew.  Pt recently transferred to Dr. Lorenso Courier and has recent progression of her disease.  Pt's new treatment plan includes radiation initially to be followed by a new chemo.  Pt has reservations regarding starting the new chemo regiment as the side effects have been very difficult for her to manage.  Pt also states that financially she is struggling as she does not have an income presently.  Pt resides w/ her grand daughter and her boyfriend as well as their baby.  They have been assisting pt financially, but she is struggling and is behind on her bills.  Pt was approved for the [employer] in 2021 when initially diagnosed and according to financial resources has $117 still available to her.  CSW put in a request to [employer] [place] for pt to be considered for a 2nd grant.  CSW provided pt w/ a [employer] card and completed the application for an [employer] card which was submitted on behalf of pt.  Pt was given a food bag from the food pantry as well.  According to pt she has a phone appointment on March 12th regarding her application for SSI and is hoping she will be approved soon.  CSW to contact pt once a determination has been made regarding re-applying for an [employer].    Henriette Combs, LCSW    Patient is participating in a Managed Medicaid Plan:  Yes

## 2022-06-17 NOTE — Telephone Encounter (Signed)
Called patient per 2/22 IB. Patient is deferring chemotherapy while going through radiation treatment. MD is informed.

## 2022-06-18 ENCOUNTER — Other Ambulatory Visit: Payer: Self-pay

## 2022-06-20 ENCOUNTER — Other Ambulatory Visit: Payer: Self-pay

## 2022-06-20 ENCOUNTER — Inpatient Hospital Stay: Payer: Medicaid Other | Admitting: Licensed Clinical Social Worker

## 2022-06-20 ENCOUNTER — Ambulatory Visit
Admission: RE | Admit: 2022-06-20 | Discharge: 2022-06-20 | Disposition: A | Discharge status: Home / Self Care | Payer: Medicaid Other | Source: Ambulatory Visit | Attending: Radiation Oncology | Admitting: Radiation Oncology

## 2022-06-20 DIAGNOSIS — C3490 Malignant neoplasm of unspecified part of unspecified bronchus or lung: Secondary | ICD-10-CM

## 2022-06-20 DIAGNOSIS — Z510 Encounter for antineoplastic radiation therapy: Secondary | ICD-10-CM | POA: Diagnosis not present

## 2022-06-20 LAB — RAD ONC ARIA SESSION SUMMARY
Course Elapsed Days: 0
Plan Fractions Treated to Date: 1
Plan Prescribed Dose Per Fraction: 3 Gy
Plan Total Fractions Prescribed: 10
Plan Total Prescribed Dose: 30 Gy
Reference Point Dosage Given to Date: 3 Gy
Reference Point Session Dosage Given: 3 Gy
Session Number: 1

## 2022-06-20 NOTE — Progress Notes (Signed)
[place] CSW Progress Note  Clinical Education officer, museum contacted patient by phone to inform she has been approved for a second [employer] account executive.  Pt given contact information for [employer] to set up a time to drop of proof of income and sign acknowledgement.  CSW to remain available as appropriate throughout duration of treatment to provide support.      Henriette Combs, LCSW    Patient is participating in a Managed Medicaid Plan:  Yes

## 2022-06-21 ENCOUNTER — Inpatient Hospital Stay: Payer: Medicaid Other

## 2022-06-21 ENCOUNTER — Ambulatory Visit
Admission: RE | Admit: 2022-06-21 | Discharge: 2022-06-21 | Disposition: A | Discharge status: Home / Self Care | Payer: Medicaid Other | Source: Ambulatory Visit | Attending: Radiation Oncology | Admitting: Radiation Oncology

## 2022-06-21 ENCOUNTER — Other Ambulatory Visit: Payer: Self-pay

## 2022-06-21 ENCOUNTER — Inpatient Hospital Stay: Payer: Medicaid Other | Admitting: Hematology and Oncology

## 2022-06-21 DIAGNOSIS — Z510 Encounter for antineoplastic radiation therapy: Secondary | ICD-10-CM | POA: Diagnosis not present

## 2022-06-21 LAB — RAD ONC ARIA SESSION SUMMARY
Course Elapsed Days: 1
Plan Fractions Treated to Date: 2
Plan Prescribed Dose Per Fraction: 3 Gy
Plan Total Fractions Prescribed: 10
Plan Total Prescribed Dose: 30 Gy
Reference Point Dosage Given to Date: 6 Gy
Reference Point Session Dosage Given: 3 Gy
Session Number: 2

## 2022-06-22 ENCOUNTER — Other Ambulatory Visit: Payer: Self-pay

## 2022-06-22 ENCOUNTER — Inpatient Hospital Stay: Payer: Medicaid Other | Admitting: Nurse Practitioner

## 2022-06-22 ENCOUNTER — Ambulatory Visit
Admission: RE | Admit: 2022-06-22 | Discharge: 2022-06-22 | Disposition: A | Discharge status: Home / Self Care | Payer: Medicaid Other | Source: Ambulatory Visit | Attending: Radiation Oncology | Admitting: Radiation Oncology

## 2022-06-22 DIAGNOSIS — Z510 Encounter for antineoplastic radiation therapy: Secondary | ICD-10-CM | POA: Diagnosis not present

## 2022-06-22 LAB — RAD ONC ARIA SESSION SUMMARY
Course Elapsed Days: 2
Plan Fractions Treated to Date: 3
Plan Prescribed Dose Per Fraction: 3 Gy
Plan Total Fractions Prescribed: 10
Plan Total Prescribed Dose: 30 Gy
Reference Point Dosage Given to Date: 9 Gy
Reference Point Session Dosage Given: 3 Gy
Session Number: 3

## 2022-06-22 NOTE — Progress Notes (Deleted)
[place]  Telephone:([phone] Fax:(336) [phone]   Name: Lauren Salazar Date: 06/22/2022 MRN: [id number]  DOB: 11-29-1960  Patient Care Team: [facility] @ [place] as PCP - General (Family Medicine)    REASON FOR CONSULTATION: Lauren Salazar is a 62 y.o. 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 Patient is also status post robotic assisted laparoscopic cystectomy and pelvic exoneration including hysterectomy, bilateral oophorectomy and ileoconduit diversion. Palliative ask to see for symptom and pain management and goals of care.    SOCIAL HISTORY:     reports that she has been smoking cigarettes. She has been smoking an average of 1 pack per day. She has never used smokeless tobacco. She reports that she does not drink alcohol and does not use drugs.  ADVANCE DIRECTIVES:  None on file  CODE STATUS: Full code  PAST MEDICAL HISTORY: Past Medical History:  Diagnosis Date   Bladder tumor    Cancer ([place])    Complication of anesthesia    slow to wake after spinal fusion, then was "looney" for the following 24 hours    Persistent dry cough    ongoing since feb 2020    PAST SURGICAL HISTORY:  Past Surgical History:  Procedure Laterality Date   CERVICAL FUSION  2005   c5-c6      CYSTOSCOPY WITH INJECTION N/A 02/01/2019   Procedure: CYSTOSCOPY WITH INJECTION;  Surgeon: Alexis Frock, MD;  [facility]: [facility];  Service: Urology;  Laterality: N/A;   LYMPHADENECTOMY Bilateral 02/01/2019   Procedure: LYMPHADENECTOMY;  Surgeon: Alexis Frock, MD;  [facility];  Service: Urology;  Laterality: Bilateral;   ROBOT ASSISTED LAPAROSCOPIC COMPLETE CYSTECT ILEAL CONDUIT N/A 02/01/2019   Procedure: XI ROBOTIC ASSISTED LAPAROSCOPIC COMPLETE CYSTECTOMY, ILEAL CONDUIT, HYSTERECTOMY WITH BILATERAL  SALPINGOOPHORECTOMY;  Surgeon: Alexis Frock, MD;  Location: [facility];  Service: Urology;  Laterality: N/A;  6 HRS   TRANSURETHRAL RESECTION OF BLADDER TUMOR N/A 10/31/2018   Procedure: TRANSURETHRAL RESECTION OF BLADDER TUMOR (TURBT) WITH CYSTOSCOPY/ POSSIBLE INTRAVESICAL GEMCITABINE;  Surgeon: Ceasar Mons, MD;  [facility];  Service: Urology;  Laterality: N/A;    HEMATOLOGY/ONCOLOGY HISTORY:  Oncology History  Lung cancer ([place])  03/14/2019 Initial Diagnosis   Lung cancer ([place])   05/14/2019 - 06/04/2019 Chemotherapy   The patient had palonosetron (ALOXI) injection 0.25 mg, 0.25 mg, Intravenous,  Once, 4 of 4 cycles Administration: 0.25 mg (05/14/2019), 0.25 mg (05/21/2019), 0.25 mg (05/28/2019), 0.25 mg (06/04/2019) CARBOplatin (PARAPLATIN) 190 mg in sodium chloride 0.9 % 250 mL chemo infusion, 190 mg (102.4 % of original dose 186 mg), Intravenous,  Once, 4 of 4 cycles Dose modification:   (original dose 186 mg, Cycle 1) Administration: 190 mg (05/14/2019), 190 mg (05/21/2019), 200 mg (05/28/2019), 200 mg (06/04/2019) PACLitaxel (TAXOL) 78 mg in sodium chloride 0.9 % 250 mL chemo infusion (</= '80mg'$ /m2), 45 mg/m2 = 78 mg, Intravenous,  Once, 4 of 4 cycles Administration: 78 mg (05/14/2019), 78 mg (05/21/2019), 78 mg (05/28/2019), 78 mg (06/04/2019)  for chemotherapy treatment.    09/03/2019 - 10/15/2019 Chemotherapy    Patient is on Treatment Plan: LUNG CARBOPLATIN / PEMETREXED / PEMBROLIZUMAB Q21D INDUCTION X 4 CYCLES / MAINTENANCE PEMETREXED + PEMBROLIZUMAB      07/22/2020 Cancer Staging   Staging form: Lung, AJCC 8th Edition - Clinical: Stage IIIB (cT4, cN2, cM0) - Signed by Zola Button  N, MD on 07/22/2020   08/04/2020 - 07/07/2021 Chemotherapy   Patient is on Treatment Plan : LUNG CARBOplatin / Pemetrexed / Pembrolizumab q21d Induction x 4 cycles / Maintenance Pemetrexed + Pembrolizumab     12/22/2020 - 03/28/2022 Chemotherapy   Patient is on Treatment Plan : ANUS Pembrolizumab (400) q42d      06/21/2022 -  Chemotherapy   Patient is on Treatment Plan : LUNG Docetaxel + Ramucirumab q21d      Cancer of lower lobe of right lung ([place])  04/02/2019 Initial Diagnosis   Cancer of lower lobe of right lung ([place])   04/02/2019 Cancer Staging   Staging form: Lung, AJCC 8th Edition - Clinical stage from 04/02/2019: Stage IIIB (cT4, cN2, cM0) - Signed by [name], MD on 04/02/2019     ALLERGIES:  has No Known Allergies.  MEDICATIONS:  Current Outpatient Medications  Medication Sig Dispense Refill   acetaminophen (TYLENOL) 500 MG tablet Take 1,000 mg by mouth every 8 (eight) hours as needed (pain).  (Patient not taking: Reported on 12/22/2020)     benzonatate (TESSALON) 200 MG capsule TAKE 1 CAPSULE(200 MG) BY MOUTH THREE TIMES DAILY AS NEEDED FOR COUGH (Patient not taking: Reported on 12/22/2020) 30 capsule 2   chlorpheniramine-HYDROcodone (TUSSIONEX) 10-8 MG/5ML SUER Take 5 mLs by mouth every 12 (twelve) hours as needed for cough. (Patient not taking: Reported on Q000111Q) [id number] mL 0   folic acid (FOLVITE) 1 MG tablet TAKE 1 TABLET(1 MG) BY MOUTH DAILY 90 tablet 3   gabapentin (NEURONTIN) 300 MG capsule Take 1 capsule (300 mg total) by mouth 3 (three) times daily. Take for significant back pain around the clock. (Patient not taking: Reported on 12/22/2020) 42 capsule 1   guaiFENesin-dextromethorphan (ROBITUSSIN DM) 100-10 MG/5ML syrup Take 5-10 mLs by mouth every 4 (four) hours as needed for cough. (Patient not taking: Reported on 12/22/2020) 473 mL 0   nicotine (NICODERM CQ - DOSED IN MG/24 HOURS) 21 mg/24hr patch Place 21 mg onto the skin daily. (Patient not taking: Reported on 12/22/2020)     oxyCODONE (OXY IR/ROXICODONE) 5 MG immediate release tablet Take 1 tablet (5 mg total) by mouth every 6 (six) hours as needed for severe pain. 30 tablet 0   prochlorperazine (COMPAZINE) 10 MG tablet Take 1 tablet (10 mg total) by mouth every 6 (six) hours as needed for nausea or vomiting. 30 tablet 0    sucralfate (CARAFATE) 1 g tablet Dissolve 1 tablet in 10 mL H20 and swallow 30 min prior to meals and bedtime PRN heartburn. (Patient not taking: Reported on 12/22/2020) 40 tablet 2   No current facility-administered medications for this visit.    VITAL SIGNS: There were no vitals taken for this visit. There were no vitals filed for this visit.  Estimated body mass index is 19.68 kg/m as calculated from the following:   Height as of 05/10/22: '5\' 6"'$  (1.676 m).   Weight as of 06/07/22: 121 lb 14.4 oz (55.3 kg).  LABS: CBC:    Component Value Date/Time   WBC 10.1 06/07/2022 1114   WBC 9.4 01/31/2019 1654   HGB 13.5 06/07/2022 1114   HCT 40.2 06/07/2022 1114   PLT 418 (H) 06/07/2022 1114   MCV 89.5 06/07/2022 1114   NEUTROABS 7.3 06/07/2022 1114   LYMPHSABS 0.9 06/07/2022 1114   MONOABS 1.3 (H) 06/07/2022 1114   EOSABS 0.5 06/07/2022 1114   BASOSABS 0.1 06/07/2022 1114   Comprehensive Metabolic Panel:    Component Value Date/Time  NA 137 06/07/2022 1114   K 4.3 06/07/2022 1114   CL 103 06/07/2022 1114   CO2 24 06/07/2022 1114   BUN 21 06/07/2022 1114   CREATININE 0.86 06/07/2022 1114   GLUCOSE 133 (H) 06/07/2022 1114   CALCIUM 9.3 06/07/2022 1114   AST 23 06/07/2022 1114   ALT 10 06/07/2022 1114   ALKPHOS 89 06/07/2022 1114   BILITOT 0.3 06/07/2022 1114   PROT 7.2 06/07/2022 1114   ALBUMIN 3.5 06/07/2022 1114    RADIOGRAPHIC STUDIES: CT CHEST ABDOMEN PELVIS W CONTRAST  Result Date: 05/27/2022 CLINICAL DATA:  Non-small cell lung cancer restaging * Tracking Code: BO * EXAM: CT CHEST, ABDOMEN, AND PELVIS WITH CONTRAST TECHNIQUE: Multidetector CT imaging of the chest, abdomen and pelvis was performed following the standard protocol during bolus administration of intravenous contrast. RADIATION DOSE REDUCTION: This exam was performed according to the departmental dose-optimization program which includes automated exposure control, adjustment of the mA and/or kV according to  patient size and/or use of iterative reconstruction technique. CONTRAST:  158m OMNIPAQUE IOHEXOL 300 MG/ML  SOLN COMPARISON:  08/16/2021 FINDINGS: CT CHEST FINDINGS Cardiovascular: Atherosclerotic calcification of the aortic arch. Mediastinum/Nodes: Unremarkable Lungs/Pleura: Further enlargement of the right lower lobe mass, currently 5.9 by 4.9 by 5.1 cm (volume = 77 cm^3). This previously measured 4.7 by 4.4 by 5.1 cm (volume = 55 cm^3) when measured in the same fashion. Trace right pleural effusion similar to prior. Centrilobular emphysema. Left apical pleuroparenchymal scarring. Increased atelectasis in the posterior basal segment left lower lobe. Musculoskeletal: Lower cervical plate and screw fixator. CT ABDOMEN PELVIS FINDINGS Hepatobiliary: Contracted gallbladder.  Otherwise unremarkable. Pancreas: Unremarkable Spleen: Unremarkable Adrenals/Urinary Tract: Stable bilateral adrenal adenomas. Peripheral scarring in the right kidney is unchanged. Cystectomy with ileal conduit. Stomach/Bowel: Sigmoid colon diverticulosis. Supraumbilical hernia contains a loop of small bowel which has associated postoperative findings. The previous herniation of transverse colon is no longer present. The hernia neck is widely patent in their no signs of complicating features such as ischemia. Vascular/Lymphatic: Atherosclerosis is present, including aortoiliac atherosclerotic disease. Suspected occluded or near occluded segment of the proximal right common iliac artery, unchanged. Subsequent reconstitution after a 1.5 cm segment. New pathologic right external iliac adenopathy, index node 1.2 cm in short axis on image 104 series 2, formerly 0.4 cm. Reproductive: Uterus absent.  Adnexa unremarkable. Other: No supplemental non-categorized findings. Musculoskeletal: New 10.5 by 10.2 by 8.2 cm (volume = 460 cm^3) lytic destructive mass of the right iliac crest with large soft tissue components extending in the iliacus and gluteus  musculature and tracking up along the right posterior lateral abdominal wall musculature, image 91 series 2. No direct impingement on the right sacral plexus or right sciatic nerve. IMPRESSION: 1. Further enlargement of the right lower lobe mass, currently 77 cubic cm in volume, previously 55 cubic cm. 2. New 460 cc lytic destructive mass of the right iliac crest with large soft tissue components extending in the iliacus and gluteus musculature and tracking up along the right posterior lateral abdominal wall musculature. 3. New pathologic right external iliac adenopathy. 4. 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 5. Stable trace right pleural effusion. 6. Stable bilateral adrenal adenomas. 7. Supraumbilical hernia contains a loop of small bowel which has associated postoperative findings. No complicating feature. The previous herniation of transverse colon is no longer present. 8. Sigmoid colon diverticulosis. 9. Cystectomy with ileal conduit. 10. Aortic atherosclerosis. 11.  Emphysema. Aortic Atherosclerosis (ICD10-I70.0) and Emphysema (ICD10-J43.9). Electronically Signed   By: Van Clines M.D.   On: 05/27/2022 09:35    PERFORMANCE STATUS (ECOG) : {CHL ONC ECOG [id number]  Review of Systems Unless otherwise noted, a complete review of systems is negative.  Physical Exam General: NAD Cardiovascular: regular rate and rhythm Pulmonary: clear ant fields Abdomen: soft, nontender, + bowel sounds GU: no suprapubic tenderness Extremities: no edema, no joint deformities Skin: no rashes Neurological:  IMPRESSION: *** I introduced myself, Isamar Wellbrock RN, and Palliative's role in collaboration with the oncology team. Concept of Palliative Care was introduced as specialized medical care for people and their families living with serious illness.  It focuses on providing relief from the symptoms and stress of a serious  illness.  The goal is to improve quality of life for both the patient and the family. Values and goals of care important to patient and family were attempted to be elicited.    We discussed *** current illness and what it means in the larger context of Her on-going co-morbidities. Natural disease trajectory and expectations were discussed.  I discussed the importance of continued conversation with family and their medical providers regarding overall plan of care and treatment options, ensuring decisions are within the context of the patients values and GOCs.  PLAN: Established therapeutic relationship. Education provided on palliative's role in collaboration with their Oncology/Radiation team. I will plan to see patient back in 2-4 weeks in collaboration to other oncology appointments.    Patient expressed understanding and was in agreement with this plan. She also understands that She can call the clinic at any time with any questions, concerns, or complaints.   Thank you for your referral and allowing Palliative to assist in Mrs. Lauren Salazar's care.   Number and complexity of problems addressed: ***HIGH - 1 or more chronic illnesses with SEVERE exacerbation, progression, or side effects of treatment - advanced cancer, pain. Any controlled substances utilized were prescribed in the context of palliative care.  Time Total: ***  Visit consisted of counseling and education dealing with the complex and emotionally intense issues of symptom management and palliative care in the setting of serious and potentially life-threatening illness.Greater than 50%  of this time was spent counseling and coordinating care related to the above assessment and plan.  Signed by: Alda Lea, AGPCNP-BC Palliative Medicine Team/[facility]

## 2022-06-23 ENCOUNTER — Other Ambulatory Visit: Payer: Self-pay

## 2022-06-23 ENCOUNTER — Ambulatory Visit
Admission: RE | Admit: 2022-06-23 | Discharge: 2022-06-23 | Disposition: A | Discharge status: Home / Self Care | Payer: Medicaid Other | Source: Ambulatory Visit | Attending: Radiation Oncology | Admitting: Radiation Oncology

## 2022-06-23 ENCOUNTER — Inpatient Hospital Stay: Payer: Medicaid Other

## 2022-06-23 DIAGNOSIS — Z510 Encounter for antineoplastic radiation therapy: Secondary | ICD-10-CM | POA: Diagnosis not present

## 2022-06-23 LAB — RAD ONC ARIA SESSION SUMMARY
Course Elapsed Days: 3
Plan Fractions Treated to Date: 4
Plan Prescribed Dose Per Fraction: 3 Gy
Plan Total Fractions Prescribed: 10
Plan Total Prescribed Dose: 30 Gy
Reference Point Dosage Given to Date: 12 Gy
Reference Point Session Dosage Given: 3 Gy
Session Number: 4

## 2022-06-24 ENCOUNTER — Ambulatory Visit
Admission: RE | Admit: 2022-06-24 | Discharge: 2022-06-24 | Disposition: A | Discharge status: Home / Self Care | Payer: Medicaid Other | Source: Ambulatory Visit | Attending: Radiation Oncology | Admitting: Radiation Oncology

## 2022-06-24 ENCOUNTER — Other Ambulatory Visit: Payer: Self-pay

## 2022-06-24 DIAGNOSIS — C3431 Malignant neoplasm of lower lobe, right bronchus or lung: Secondary | ICD-10-CM | POA: Insufficient documentation

## 2022-06-24 DIAGNOSIS — Z510 Encounter for antineoplastic radiation therapy: Secondary | ICD-10-CM | POA: Diagnosis present

## 2022-06-24 DIAGNOSIS — C7951 Secondary malignant neoplasm of bone: Secondary | ICD-10-CM | POA: Insufficient documentation

## 2022-06-24 LAB — RAD ONC ARIA SESSION SUMMARY
Course Elapsed Days: 4
Plan Fractions Treated to Date: 5
Plan Prescribed Dose Per Fraction: 3 Gy
Plan Total Fractions Prescribed: 10
Plan Total Prescribed Dose: 30 Gy
Reference Point Dosage Given to Date: 15 Gy
Reference Point Session Dosage Given: 3 Gy
Session Number: 5

## 2022-06-27 ENCOUNTER — Ambulatory Visit
Admission: RE | Admit: 2022-06-27 | Discharge: 2022-06-27 | Disposition: A | Discharge status: Home / Self Care | Payer: Medicaid Other | Source: Ambulatory Visit | Attending: Radiation Oncology | Admitting: Radiation Oncology

## 2022-06-27 ENCOUNTER — Other Ambulatory Visit: Payer: Self-pay

## 2022-06-27 DIAGNOSIS — Z510 Encounter for antineoplastic radiation therapy: Secondary | ICD-10-CM | POA: Diagnosis not present

## 2022-06-27 LAB — RAD ONC ARIA SESSION SUMMARY
Course Elapsed Days: 7
Plan Fractions Treated to Date: 6
Plan Prescribed Dose Per Fraction: 3 Gy
Plan Total Fractions Prescribed: 10
Plan Total Prescribed Dose: 30 Gy
Reference Point Dosage Given to Date: 18 Gy
Reference Point Session Dosage Given: 3 Gy
Session Number: 6

## 2022-06-28 ENCOUNTER — Other Ambulatory Visit: Payer: Self-pay

## 2022-06-28 ENCOUNTER — Ambulatory Visit
Admission: RE | Admit: 2022-06-28 | Discharge: 2022-06-28 | Disposition: A | Discharge status: Home / Self Care | Payer: Medicaid Other | Source: Ambulatory Visit | Attending: Radiation Oncology | Admitting: Radiation Oncology

## 2022-06-28 DIAGNOSIS — Z510 Encounter for antineoplastic radiation therapy: Secondary | ICD-10-CM | POA: Diagnosis not present

## 2022-06-28 LAB — RAD ONC ARIA SESSION SUMMARY
Course Elapsed Days: 8
Plan Fractions Treated to Date: 7
Plan Prescribed Dose Per Fraction: 3 Gy
Plan Total Fractions Prescribed: 10
Plan Total Prescribed Dose: 30 Gy
Reference Point Dosage Given to Date: 21 Gy
Reference Point Session Dosage Given: 3 Gy
Session Number: 7

## 2022-06-29 ENCOUNTER — Other Ambulatory Visit: Payer: Self-pay

## 2022-06-29 ENCOUNTER — Ambulatory Visit
Admission: RE | Admit: 2022-06-29 | Discharge: 2022-06-29 | Disposition: A | Discharge status: Home / Self Care | Payer: Medicaid Other | Source: Ambulatory Visit | Attending: Radiation Oncology | Admitting: Radiation Oncology

## 2022-06-29 DIAGNOSIS — Z510 Encounter for antineoplastic radiation therapy: Secondary | ICD-10-CM | POA: Diagnosis not present

## 2022-06-29 LAB — RAD ONC ARIA SESSION SUMMARY
Course Elapsed Days: 9
Plan Fractions Treated to Date: 8
Plan Prescribed Dose Per Fraction: 3 Gy
Plan Total Fractions Prescribed: 10
Plan Total Prescribed Dose: 30 Gy
Reference Point Dosage Given to Date: 24 Gy
Reference Point Session Dosage Given: 3 Gy
Session Number: 8

## 2022-06-30 ENCOUNTER — Ambulatory Visit
Admission: RE | Admit: 2022-06-30 | Discharge: 2022-06-30 | Disposition: A | Discharge status: Home / Self Care | Payer: Medicaid Other | Source: Ambulatory Visit | Attending: Radiation Oncology | Admitting: Radiation Oncology

## 2022-06-30 ENCOUNTER — Other Ambulatory Visit: Payer: Self-pay

## 2022-06-30 DIAGNOSIS — Z510 Encounter for antineoplastic radiation therapy: Secondary | ICD-10-CM | POA: Diagnosis not present

## 2022-06-30 LAB — RAD ONC ARIA SESSION SUMMARY
Course Elapsed Days: 10
Plan Fractions Treated to Date: 9
Plan Prescribed Dose Per Fraction: 3 Gy
Plan Total Fractions Prescribed: 10
Plan Total Prescribed Dose: 30 Gy
Reference Point Dosage Given to Date: 27 Gy
Reference Point Session Dosage Given: 3 Gy
Session Number: 9

## 2022-07-01 ENCOUNTER — Ambulatory Visit
Admission: RE | Admit: 2022-07-01 | Discharge: 2022-07-01 | Disposition: A | Discharge status: Home / Self Care | Payer: Medicaid Other | Source: Ambulatory Visit | Attending: Radiation Oncology | Admitting: Radiation Oncology

## 2022-07-01 ENCOUNTER — Other Ambulatory Visit: Payer: Self-pay

## 2022-07-01 DIAGNOSIS — Z510 Encounter for antineoplastic radiation therapy: Secondary | ICD-10-CM | POA: Diagnosis not present

## 2022-07-01 LAB — RAD ONC ARIA SESSION SUMMARY
Course Elapsed Days: 11
Plan Fractions Treated to Date: 10
Plan Prescribed Dose Per Fraction: 3 Gy
Plan Total Fractions Prescribed: 10
Plan Total Prescribed Dose: 30 Gy
Reference Point Dosage Given to Date: 30 Gy
Reference Point Session Dosage Given: 3 Gy
Session Number: 10

## 2022-07-01 NOTE — Radiation Completion Notes (Signed)
Patient Name: Lauren Salazar, Lauren Salazar MRN: [id number] Date of Birth: [birth year] Referring Physician: Chipper Herb, M.D. Date of Service: 2022-07-01 Radiation Oncologist: [name], M.D. [place] END OF TREATMENT NOTE     Diagnosis: C79.51 Secondary malignant neoplasm of bone Staging on 2020-07-22: Lung cancer ([place]) T=cT4, N=cN2, M=cM0 Staging on 2019-04-02: Cancer of lower lobe of right lung ([facility]) T=cT4, N=cN2, M=cM0 Intent: Palliative     ==========DELIVERED PLANS==========  First Treatment Date: 2022-06-20 - Last Treatment Date: 2022-07-01   Plan Name: Pelvis_R Site: [place], Right Technique: 3D Mode: Photon Dose Per Fraction: 3 Gy Prescribed Dose (Delivered / Prescribed): 30 Gy / 30 Gy Prescribed Fxs (Delivered / Prescribed): 10 / 10     ==========ON TREATMENT VISIT DATES========== 2022-06-21, 2022-06-27     ==========UPCOMING VISITS==========       ==========APPENDIX - ON TREATMENT VISIT NOTES==========   See weekly On Treatment Notes is Epic for details.

## 2022-07-08 NOTE — Progress Notes (Unsigned)
[place]  Telephone:(336) [phone] Fax:(336) [phone]   Name: Lauren Salazar Date: 07/08/2022 MRN: [id number]  DOB: 1961-04-22  Patient Care Team: [facility] @ [facility] as PCP - General (Family Medicine)    REASON FOR CONSULTATION: Lauren Salazar is a 62 y.o. 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 Patient is also status post robotic assisted laparoscopic cystectomy and pelvic exoneration including hysterectomy, bilateral oophorectomy and ileoconduit diversion. Palliative ask to see for symptom and pain management and goals of care.    SOCIAL HISTORY:     reports that she has been smoking cigarettes. She has been smoking an average of 1 pack per day. She has never used smokeless tobacco. She reports that she does not drink alcohol and does not use drugs.  ADVANCE DIRECTIVES:  None on file  CODE STATUS: Full code  PAST MEDICAL HISTORY: Past Medical History:  Diagnosis Date   Bladder tumor    Cancer ([place])    Complication of anesthesia    slow to wake after spinal fusion, then was "looney" for the following 24 hours    Persistent dry cough    ongoing since feb 2020    PAST SURGICAL HISTORY:  Past Surgical History:  Procedure Laterality Date   CERVICAL FUSION  2005   c5-c6      CYSTOSCOPY WITH INJECTION N/A 02/01/2019   Procedure: CYSTOSCOPY WITH INJECTION;  Surgeon: Alexis Frock, MD;  [facility];  Service: Urology;  Laterality: N/A;   LYMPHADENECTOMY Bilateral 02/01/2019   Procedure: LYMPHADENECTOMY;  Surgeon: Alexis Frock, MD;  [facility];  Service: Urology;  Laterality: Bilateral;   ROBOT ASSISTED LAPAROSCOPIC COMPLETE CYSTECT ILEAL CONDUIT N/A 02/01/2019   Procedure: XI ROBOTIC ASSISTED LAPAROSCOPIC COMPLETE CYSTECTOMY, ILEAL CONDUIT, HYSTERECTOMY WITH BILATERAL  SALPINGOOPHORECTOMY;  Surgeon: Alexis Frock, MD;  [facility];  Service: Urology;  Laterality: N/A;  6 HRS   TRANSURETHRAL RESECTION OF BLADDER TUMOR N/A 10/31/2018   Procedure: TRANSURETHRAL RESECTION OF BLADDER TUMOR (TURBT) WITH CYSTOSCOPY/ POSSIBLE INTRAVESICAL GEMCITABINE;  Surgeon: Ceasar Mons, MD;  [facility];  Service: Urology;  Laterality: N/A;    HEMATOLOGY/ONCOLOGY HISTORY:  Oncology History  Lung cancer ([place])  03/14/2019 Initial Diagnosis   Lung cancer ([place])   05/14/2019 - 06/04/2019 Chemotherapy   The patient had palonosetron (ALOXI) injection 0.25 mg, 0.25 mg, Intravenous,  Once, 4 of 4 cycles Administration: 0.25 mg (05/14/2019), 0.25 mg (05/21/2019), 0.25 mg (05/28/2019), 0.25 mg (06/04/2019) CARBOplatin (PARAPLATIN) 190 mg in sodium chloride 0.9 % 250 mL chemo infusion, 190 mg (102.4 % of original dose 186 mg), Intravenous,  Once, 4 of 4 cycles Dose modification:   (original dose 186 mg, Cycle 1) Administration: 190 mg (05/14/2019), 190 mg (05/21/2019), 200 mg (05/28/2019), 200 mg (06/04/2019) PACLitaxel (TAXOL) 78 mg in sodium chloride 0.9 % 250 mL chemo infusion (</= 80mg /m2), 45 mg/m2 = 78 mg, Intravenous,  Once, 4 of 4 cycles Administration: 78 mg (05/14/2019), 78 mg (05/21/2019), 78 mg (05/28/2019), 78 mg (06/04/2019)  for chemotherapy treatment.    09/03/2019 - 10/15/2019 Chemotherapy    Patient is on Treatment Plan: LUNG CARBOPLATIN / PEMETREXED / PEMBROLIZUMAB Q21D INDUCTION X 4 CYCLES / MAINTENANCE PEMETREXED + PEMBROLIZUMAB      07/22/2020 Cancer Staging   Staging form: Lung, AJCC 8th Edition - Clinical: Stage IIIB (cT4, cN2, cM0) - Signed by Zola Button  N, MD on 07/22/2020   08/04/2020 - 07/07/2021 Chemotherapy   Patient is on Treatment Plan : LUNG CARBOplatin / Pemetrexed / Pembrolizumab q21d Induction x 4 cycles / Maintenance Pemetrexed + Pembrolizumab     12/22/2020 - 03/28/2022 Chemotherapy   Patient is on Treatment Plan : ANUS Pembrolizumab (400) q42d      06/21/2022 -  Chemotherapy   Patient is on Treatment Plan : LUNG Docetaxel + Ramucirumab q21d      Cancer of lower lobe of right lung ([place])  04/02/2019 Initial Diagnosis   Cancer of lower lobe of right lung ([place])   04/02/2019 Cancer Staging   Staging form: Lung, AJCC 8th Edition - Clinical stage from 04/02/2019: Stage IIIB (cT4, cN2, cM0) - Signed by [name], MD on 04/02/2019     ALLERGIES:  has No Known Allergies.  MEDICATIONS:  Current Outpatient Medications  Medication Sig Dispense Refill   acetaminophen (TYLENOL) 500 MG tablet Take 1,000 mg by mouth every 8 (eight) hours as needed (pain).  (Patient not taking: Reported on 12/22/2020)     benzonatate (TESSALON) 200 MG capsule TAKE 1 CAPSULE(200 MG) BY MOUTH THREE TIMES DAILY AS NEEDED FOR COUGH (Patient not taking: Reported on 12/22/2020) 30 capsule 2   chlorpheniramine-HYDROcodone (TUSSIONEX) 10-8 MG/5ML SUER Take 5 mLs by mouth every 12 (twelve) hours as needed for cough. (Patient not taking: Reported on Q000111Q) [id number] mL 0   folic acid (FOLVITE) 1 MG tablet TAKE 1 TABLET(1 MG) BY MOUTH DAILY 90 tablet 3   gabapentin (NEURONTIN) 300 MG capsule Take 1 capsule (300 mg total) by mouth 3 (three) times daily. Take for significant back pain around the clock. (Patient not taking: Reported on 12/22/2020) 42 capsule 1   guaiFENesin-dextromethorphan (ROBITUSSIN DM) 100-10 MG/5ML syrup Take 5-10 mLs by mouth every 4 (four) hours as needed for cough. (Patient not taking: Reported on 12/22/2020) 473 mL 0   nicotine (NICODERM CQ - DOSED IN MG/24 HOURS) 21 mg/24hr patch Place 21 mg onto the skin daily. (Patient not taking: Reported on 12/22/2020)     oxyCODONE (OXY IR/ROXICODONE) 5 MG immediate release tablet Take 1 tablet (5 mg total) by mouth every 6 (six) hours as needed for severe pain. 30 tablet 0   prochlorperazine (COMPAZINE) 10 MG tablet Take 1 tablet (10 mg total) by mouth every 6 (six) hours as needed for nausea or vomiting. 30 tablet 0    sucralfate (CARAFATE) 1 g tablet Dissolve 1 tablet in 10 mL H20 and swallow 30 min prior to meals and bedtime PRN heartburn. (Patient not taking: Reported on 12/22/2020) 40 tablet 2   No current facility-administered medications for this visit.    VITAL SIGNS: There were no vitals taken for this visit. There were no vitals filed for this visit.  Estimated body mass index is 19.68 kg/m as calculated from the following:   Height as of 05/10/22: '5\' 6"'$  (1.676 m).   Weight as of 06/07/22: 121 lb 14.4 oz (55.3 kg).  LABS: CBC:    Component Value Date/Time   WBC 10.1 06/07/2022 1114   WBC 9.4 01/31/2019 1654   HGB 13.5 06/07/2022 1114   HCT 40.2 06/07/2022 1114   PLT 418 (H) 06/07/2022 1114   MCV 89.5 06/07/2022 1114   NEUTROABS 7.3 06/07/2022 1114   LYMPHSABS 0.9 06/07/2022 1114   MONOABS 1.3 (H) 06/07/2022 1114   EOSABS 0.5 06/07/2022 1114   BASOSABS 0.1 06/07/2022 1114   Comprehensive Metabolic Panel:    Component Value Date/Time  NA 137 06/07/2022 1114   K 4.3 06/07/2022 1114   CL 103 06/07/2022 1114   CO2 24 06/07/2022 1114   BUN 21 06/07/2022 1114   CREATININE 0.86 06/07/2022 1114   GLUCOSE 133 (H) 06/07/2022 1114   CALCIUM 9.3 06/07/2022 1114   AST 23 06/07/2022 1114   ALT 10 06/07/2022 1114   ALKPHOS 89 06/07/2022 1114   BILITOT 0.3 06/07/2022 1114   PROT 7.2 06/07/2022 1114   ALBUMIN 3.5 06/07/2022 1114    RADIOGRAPHIC STUDIES: No results found.  PERFORMANCE STATUS (ECOG) : {CHL ONC ECOG [id number]  Review of Systems Unless otherwise noted, a complete review of systems is negative.  Physical Exam General: NAD Cardiovascular: regular rate and rhythm Pulmonary: clear ant fields Abdomen: soft, nontender, + bowel sounds GU: no suprapubic tenderness Extremities: no edema, no joint deformities Skin: no rashes Neurological:  IMPRESSION: *** I introduced myself, [place] RN, and Palliative's role in collaboration with the oncology team. Concept of  Palliative Care was introduced as specialized medical care for people and their families living with serious illness.  It focuses on providing relief from the symptoms and stress of a serious illness.  The goal is to improve quality of life for both the patient and the family. Values and goals of care important to patient and family were attempted to be elicited.    We discussed *** current illness and what it means in the larger context of Lauren Salazar on-going co-morbidities. Natural disease trajectory and expectations were discussed.  I discussed the importance of continued conversation with family and their medical providers regarding overall plan of care and treatment options, ensuring decisions are within the context of the patients values and GOCs.  PLAN: Established therapeutic relationship. Education provided on palliative's role in collaboration with their Oncology/Radiation team. I will plan to see patient back in 2-4 weeks in collaboration to other oncology appointments.    Patient expressed understanding and was in agreement with this plan. She also understands that She can call the clinic at any time with any questions, concerns, or complaints.   Thank you for your referral and allowing Palliative to assist in Lauren Salazar's care.   Number and complexity of problems addressed: ***HIGH - 1 or more chronic illnesses with SEVERE exacerbation, progression, or side effects of treatment - advanced cancer, pain. Any controlled substances utilized were prescribed in the context of palliative care.  Time Total: ***  Visit consisted of counseling and education dealing with the complex and emotionally intense issues of symptom management and palliative care in the setting of serious and potentially life-threatening illness.Greater than 50%  of this time was spent counseling and coordinating care related to the above assessment and plan.  Signed by: Alda Lea,  AGPCNP-BC Palliative Medicine Team/[place]

## 2022-07-11 MED FILL — Dexamethasone Sodium Phosphate Inj 100 MG/10ML: INTRAMUSCULAR | Qty: 1 | Status: AC

## 2022-07-12 ENCOUNTER — Inpatient Hospital Stay: Payer: Medicaid Other | Attending: Oncology

## 2022-07-12 ENCOUNTER — Other Ambulatory Visit: Payer: Self-pay

## 2022-07-12 ENCOUNTER — Inpatient Hospital Stay (HOSPITAL_BASED_OUTPATIENT_CLINIC_OR_DEPARTMENT_OTHER): Payer: Medicaid Other

## 2022-07-12 ENCOUNTER — Inpatient Hospital Stay (HOSPITAL_BASED_OUTPATIENT_CLINIC_OR_DEPARTMENT_OTHER): Payer: Medicaid Other | Admitting: Hematology and Oncology

## 2022-07-12 ENCOUNTER — Inpatient Hospital Stay (HOSPITAL_BASED_OUTPATIENT_CLINIC_OR_DEPARTMENT_OTHER): Payer: Medicaid Other | Admitting: Nurse Practitioner

## 2022-07-12 ENCOUNTER — Encounter: Payer: Self-pay | Admitting: Nurse Practitioner

## 2022-07-12 VITALS — BP 152/90 | HR 96 | Temp 97.9°F | Resp 18

## 2022-07-12 VITALS — BP 135/97 | HR 117 | Temp 98.2°F | Resp 17 | Wt 119.5 lb

## 2022-07-12 DIAGNOSIS — Z7189 Other specified counseling: Secondary | ICD-10-CM | POA: Diagnosis not present

## 2022-07-12 DIAGNOSIS — C3490 Malignant neoplasm of unspecified part of unspecified bronchus or lung: Secondary | ICD-10-CM

## 2022-07-12 DIAGNOSIS — M25551 Pain in right hip: Secondary | ICD-10-CM | POA: Diagnosis not present

## 2022-07-12 DIAGNOSIS — Z5111 Encounter for antineoplastic chemotherapy: Secondary | ICD-10-CM | POA: Diagnosis present

## 2022-07-12 DIAGNOSIS — K5900 Constipation, unspecified: Secondary | ICD-10-CM

## 2022-07-12 DIAGNOSIS — F17210 Nicotine dependence, cigarettes, uncomplicated: Secondary | ICD-10-CM | POA: Diagnosis not present

## 2022-07-12 DIAGNOSIS — R530 Neoplastic (malignant) related fatigue: Secondary | ICD-10-CM | POA: Diagnosis not present

## 2022-07-12 DIAGNOSIS — C679 Malignant neoplasm of bladder, unspecified: Secondary | ICD-10-CM | POA: Diagnosis not present

## 2022-07-12 DIAGNOSIS — C3431 Malignant neoplasm of lower lobe, right bronchus or lung: Secondary | ICD-10-CM | POA: Diagnosis not present

## 2022-07-12 DIAGNOSIS — G893 Neoplasm related pain (acute) (chronic): Secondary | ICD-10-CM | POA: Diagnosis not present

## 2022-07-12 DIAGNOSIS — R109 Unspecified abdominal pain: Secondary | ICD-10-CM | POA: Insufficient documentation

## 2022-07-12 DIAGNOSIS — R197 Diarrhea, unspecified: Secondary | ICD-10-CM | POA: Insufficient documentation

## 2022-07-12 DIAGNOSIS — Z79899 Other long term (current) drug therapy: Secondary | ICD-10-CM | POA: Diagnosis not present

## 2022-07-12 DIAGNOSIS — Z515 Encounter for palliative care: Secondary | ICD-10-CM | POA: Diagnosis not present

## 2022-07-12 DIAGNOSIS — R059 Cough, unspecified: Secondary | ICD-10-CM | POA: Insufficient documentation

## 2022-07-12 LAB — CBC WITH DIFFERENTIAL (CANCER CENTER ONLY)
Abs Immature Granulocytes: 0.05 10*3/uL (ref 0.00–0.07)
Basophils Absolute: 0.1 10*3/uL (ref 0.0–0.1)
Basophils Relative: 1 %
Eosinophils Absolute: 0.3 10*3/uL (ref 0.0–0.5)
Eosinophils Relative: 4 %
HCT: 37.8 % (ref 36.0–46.0)
Hemoglobin: 12.2 g/dL (ref 12.0–15.0)
Immature Granulocytes: 1 %
Lymphocytes Relative: 6 %
Lymphs Abs: 0.4 10*3/uL — ABNORMAL LOW (ref 0.7–4.0)
MCH: 29.3 pg (ref 26.0–34.0)
MCHC: 32.3 g/dL (ref 30.0–36.0)
MCV: 90.6 fL (ref 80.0–100.0)
Monocytes Absolute: 1 10*3/uL (ref 0.1–1.0)
Monocytes Relative: 13 %
Neutro Abs: 5.8 10*3/uL (ref 1.7–7.7)
Neutrophils Relative %: 75 %
Platelet Count: 289 10*3/uL (ref 150–400)
RBC: 4.17 MIL/uL (ref 3.87–5.11)
RDW: 15.9 % — ABNORMAL HIGH (ref 11.5–15.5)
WBC Count: 7.7 10*3/uL (ref 4.0–10.5)
nRBC: 0 % (ref 0.0–0.2)

## 2022-07-12 LAB — CMP (CANCER CENTER ONLY)
ALT: 12 U/L (ref 0–44)
AST: 28 U/L (ref 15–41)
Albumin: 3.3 g/dL — ABNORMAL LOW (ref 3.5–5.0)
Alkaline Phosphatase: 84 U/L (ref 38–126)
Anion gap: 9 (ref 5–15)
BUN: 19 mg/dL (ref 8–23)
CO2: 26 mmol/L (ref 22–32)
Calcium: 9 mg/dL (ref 8.9–10.3)
Chloride: 104 mmol/L (ref 98–111)
Creatinine: 0.74 mg/dL (ref 0.44–1.00)
GFR, Estimated: >60 mL/min (ref >60)
Glucose, Bld: 131 mg/dL — ABNORMAL HIGH (ref 70–99)
Potassium: 4.2 mmol/L (ref 3.5–5.1)
Sodium: 139 mmol/L (ref 135–145)
Total Bilirubin: 0.3 mg/dL (ref 0.3–1.2)
Total Protein: 7.1 g/dL (ref 6.5–8.1)

## 2022-07-12 LAB — TSH: TSH: 2.528 u[IU]/mL (ref 0.350–4.500)

## 2022-07-12 LAB — TOTAL PROTEIN, URINE DIPSTICK: Protein, ur: 30 mg/dL — AB

## 2022-07-12 MED ORDER — SODIUM CHLORIDE 0.9 % IV SOLN
500.0000 mg | Freq: Once | INTRAVENOUS | Status: AC
  Administered 2022-07-12: 500 mg via INTRAVENOUS
  Filled 2022-07-12: qty 50

## 2022-07-12 MED ORDER — DIPHENHYDRAMINE HCL 50 MG/ML IJ SOLN
50.0000 mg | Freq: Once | INTRAMUSCULAR | Status: AC
  Administered 2022-07-12: 50 mg via INTRAVENOUS
  Filled 2022-07-12: qty 1

## 2022-07-12 MED ORDER — SODIUM CHLORIDE 0.9 % IV SOLN: Freq: Once | INTRAVENOUS | Status: AC

## 2022-07-12 MED ORDER — SODIUM CHLORIDE 0.9 % IV SOLN
75.0000 mg/m2 | Freq: Once | INTRAVENOUS | Status: AC
  Administered 2022-07-12: 120 mg via INTRAVENOUS
  Filled 2022-07-12: qty 12

## 2022-07-12 MED ORDER — SODIUM CHLORIDE 0.9 % IV SOLN
10.0000 mg | Freq: Once | INTRAVENOUS | Status: AC
  Administered 2022-07-12: 10 mg via INTRAVENOUS
  Filled 2022-07-12: qty 10

## 2022-07-12 MED ORDER — ACETAMINOPHEN 325 MG PO TABS
650.0000 mg | ORAL_TABLET | Freq: Once | ORAL | Status: AC
  Administered 2022-07-12: 650 mg via ORAL
  Filled 2022-07-12: qty 2

## 2022-07-12 NOTE — Progress Notes (Signed)
Decrease Cyramza to 500mg  due to weight loss per MD.  Acquanetta Belling, RPH, BCPS, BCOP 07/12/2022 1:59 PM

## 2022-07-12 NOTE — Patient Instructions (Addendum)
From your appointment with Lauren Salazar in Palliative Care: - continue your pain medication as prescribed - pick up and take mirilax (over the counter med) everyday or constipation, if you have diarrhea you can stop taking it, you should aim for one bowel movement a day - you can pick up and take imodium (over the counter medication) for diarrhea, 2 pills after first diarrhea, and 1 pill after each subsequent event of diarrhea - we will call you next Tuesday to check in and see how things are going - give [place] a call 2-3 days before you need a refill so we have time to get it to the pharmacy  Dr. Lorenso Courier on coughing: over the counter Delsym and prop yourself partially up on pillows.   [facility]  Discharge Instructions: Thank you for choosing [facility] to provide your oncology and hematology care.   If you have a lab appointment with the [facility], please go directly to the [facility] and check in at the registration area.   Wear comfortable clothing and clothing appropriate for easy access to any Portacath or PICC line.   We strive to give you quality time with your provider. You may need to reschedule your appointment if you arrive late (15 or more minutes).  Arriving late affects you and other patients whose appointments are after yours.  Also, if you miss three or more appointments without notifying the office, you may be dismissed from the clinic at the provider's discretion.      For prescription refill requests, have your pharmacy contact our office and allow 72 hours for refills to be completed.    Today you received the following chemotherapy and/or immunotherapy agents: Cyramza, Docetaxel      To help prevent nausea and vomiting after your treatment, we encourage you to take your nausea medication as directed.  BELOW ARE SYMPTOMS THAT SHOULD BE REPORTED IMMEDIATELY: *FEVER GREATER THAN 100.4 F (38 C) OR HIGHER *CHILLS OR  SWEATING *NAUSEA AND VOMITING THAT IS NOT CONTROLLED WITH YOUR NAUSEA MEDICATION *UNUSUAL SHORTNESS OF BREATH *UNUSUAL BRUISING OR BLEEDING *URINARY PROBLEMS (pain or burning when urinating, or frequent urination) *BOWEL PROBLEMS (unusual diarrhea, constipation, pain near the anus) TENDERNESS IN MOUTH AND THROAT WITH OR WITHOUT PRESENCE OF ULCERS (sore throat, sores in mouth, or a toothache) UNUSUAL RASH, SWELLING OR PAIN  UNUSUAL VAGINAL DISCHARGE OR ITCHING   Items with * indicate a potential emergency and should be followed up as soon as possible or go to the Emergency Department if any problems should occur.  Please show the CHEMOTHERAPY ALERT CARD or IMMUNOTHERAPY ALERT CARD at check-in to the Emergency Department and triage nurse.  Should you have questions after your visit or need to cancel or reschedule your appointment, please contact [facility]  Dept: [phone]  and follow the prompts.  Office hours are 8:00 a.m. to 4:30 p.m. Monday - Friday. Please note that voicemails left after 4:00 p.m. may not be returned until the following business day.  We are closed weekends and major holidays. You have access to a nurse at all times for urgent questions. Please call the main number to the clinic Dept: [phone] and follow the prompts.   For any non-urgent questions, you may also contact your provider using MyChart. We now offer e-Visits for anyone 59 and older to request care online for non-urgent symptoms. For details visit [email or web address].   Also download the  MyChart app! Go to the app store, search "MyChart", open the app, select [facility], and log in with your MyChart username and password.  Ramucirumab Injection What is this medication? RAMUCIRUMAB (ra mue SIR ue mab) treats some types of cancer. It works by blocking a protein that causes cancer cells to grow and multiply. This helps to slow or stop the spread of cancer cells. It is  a monoclonal antibody. This medicine may be used for other purposes; ask your health care provider or pharmacist if you have questions. COMMON BRAND NAME(S): Cyramza What should I tell my care team before I take this medication? They need to know if you have any of these conditions: Blood clots Having or recent surgery Heart attack High blood pressure History of a tear in your stomach or intestines Liver disease Protein in your urine Stomach bleeding Stroke Thyroid disease An unusual or allergic reaction to ramucirumab, other medications, foods, dyes, or preservatives Pregnant or trying to get pregnant Breast-feeding How should I use this medication? This medication is injected into a vein. It is given by your care team in a hospital or clinic setting. Talk to your care team about the use of this medication in children. Special care may be needed. Overdosage: If you think you have taken too much of this medicine contact a poison control center or emergency room at once. NOTE: This medicine is only for you. Do not share this medicine with others. What if I miss a dose? Keep appointments for follow-up doses. It is important not to miss your dose. Call your care team if you are unable to keep an appointment. What may interact with this medication? Interactions have not been studied. This list may not describe all possible interactions. Give your health care provider a list of all the medicines, herbs, non-prescription drugs, or dietary supplements you use. Also tell them if you smoke, drink alcohol, or use illegal drugs. Some items may interact with your medicine. What should I watch for while using this medication? Your condition will be monitored carefully while you are receiving this medication. You may need blood work while taking this medication. This medication may make you feel generally unwell. This is not uncommon as chemotherapy can affect health cells as well as cancer cells.  Report any side effects. Continue your course of treatment even though you feel ill unless your care team tells you to stop. This medication may increase your risk to bruise or bleed. Call your care team if you notice any unusual bleeding. Before having surgery, talk to your care team to make sure it is ok. This medication can increase the risk of poor healing of your surgical site or wound. You will need to stop this medication for 28 days before surgery. After surgery, wait at least 2 weeks before restarting this medication. Make sure the surgical site or wound is healed enough before restarting this medication. Talk to your care team if questions. Talk to your care team if you may be pregnant. Serious birth defects can occur if you take this medication during pregnancy and for 3 months after the last dose. You will need a negative pregnancy test before starting this medication. Contraception is recommended while taking this medication and for 3 months after the last dose. Your care team can help you find the option that works for you. Do not breastfeed while taking this medication and for 2 months after the last dose. This medication may cause infertility. Talk to your care  team if you are concerned about your fertility. What side effects may I notice from receiving this medication? Side effects that you should report to your care team as soon as possible: Allergic reactions--skin rash, itching, hives, swelling of the face, lips, tongue, or throat Bleeding--bloody or black, tar-like stools, vomiting blood or brown material that looks like coffee grounds, red or dark brown urine, small red or purple spots on skin, unusual bruising or bleeding Dizziness, loss of balance or coordination, confusion or trouble speaking Heart attack--pain or tightness in the chest, shoulders, arms, or jaw, nausea, shortness of breath, cold or clammy skin, feeling faint or lightheaded Increase in blood  pressure Infection--fever, chills, cough, sore throat, wounds that don't heal, pain or trouble when passing urine, general feeling of discomfort or being unwell Infusion reactions--chest pain, shortness of breath or trouble breathing, feeling faint or lightheaded Kidney injury--decrease in the amount of urine, swelling of the ankles, hands, or feet Liver injury--right upper belly pain, loss of appetite, nausea, light-colored stool, dark yellow or brown urine, yellowing skin or eyes, unusual weakness or fatigue Low thyroid levels (hypothyroidism)--unusual weakness or fatigue, increased sensitivity to cold, constipation, hair loss, dry skin, weight gain, feelings of depression Stomach pain that is severe, does not go away, or gets worse Stroke--sudden numbness or weakness of the face, arm, or leg, trouble speaking, confusion, trouble walking, loss of balance or coordination, dizziness, severe headache, change in vision Sudden and severe headache, confusion, change in vision, seizures, which may be signs of posterior reversible encephalopathy syndrome (PRES) Side effects that usually do not require medical attention (report to your care team if they continue or are bothersome): Diarrhea Fatigue Stomach pain Swelling of the ankles, hands, or feet This list may not describe all possible side effects. Call your doctor for medical advice about side effects. You may report side effects to FDA at 1-800-FDA-1088. Where should I keep my medication? This medication is given in a hospital or clinic. It will not be stored at home. NOTE: This sheet is a summary. It may not cover all possible information. If you have questions about this medicine, talk to your doctor, pharmacist, or health care provider.  2023 Elsevier/Gold Standard (2021-08-13 00:00:00)  Docetaxel Injection What is this medication? DOCETAXEL (doe se TAX el) treats some types of cancer. It works by slowing down the growth of cancer  cells. This medicine may be used for other purposes; ask your health care provider or pharmacist if you have questions. COMMON BRAND NAME(S): Docefrez, Taxotere What should I tell my care team before I take this medication? They need to know if you have any of these conditions: Kidney disease Liver disease Low white blood cell levels Tingling of the fingers or toes or other nerve disorder An unusual or allergic reaction to docetaxel, polysorbate 80, other medications, foods, dyes, or preservatives Pregnant or trying to get pregnant Breast-feeding How should I use this medication? This medication is injected into a vein. It is given by your care team in a hospital or clinic setting. Talk to your care team about the use of this medication in children. Special care may be needed. Overdosage: If you think you have taken too much of this medicine contact a poison control center or emergency room at once. NOTE: This medicine is only for you. Do not share this medicine with others. What if I miss a dose? Keep appointments for follow-up doses. It is important not to miss your dose. Call your care team if  you are unable to keep an appointment. What may interact with this medication? Do not take this medication with any of the following: Live virus vaccines This medication may also interact with the following: Certain antibiotics, such as clarithromycin, telithromycin Certain antivirals for HIV or hepatitis Certain medications for fungal infections, such as itraconazole, ketoconazole, voriconazole Grapefruit juice Nefazodone Supplements, such as St. John's wort This list may not describe all possible interactions. Give your health care provider a list of all the medicines, herbs, non-prescription drugs, or dietary supplements you use. Also tell them if you smoke, drink alcohol, or use illegal drugs. Some items may interact with your medicine. What should I watch for while using this  medication? This medication may make you feel generally unwell. This is not uncommon as chemotherapy can affect healthy cells as well as cancer cells. Report any side effects. Continue your course of treatment even though you feel ill unless your care team tells you to stop. You may need blood work done while you are taking this medication. This medication can cause serious side effects and infusion reactions. To reduce the risk, your care team may give you other medications to take before receiving this one. Be sure to follow the directions from your care team. This medication may increase your risk of getting an infection. Call your care team for advice if you get a fever, chills, sore throat, or other symptoms of a cold or flu. Do not treat yourself. Try to avoid being around people who are sick. Avoid taking medications that contain aspirin, acetaminophen, ibuprofen, naproxen, or ketoprofen unless instructed by your care team. These medications may hide a fever. Be careful brushing or flossing your teeth or using a toothpick because you may get an infection or bleed more easily. If you have any dental work done, tell your dentist you are receiving this medication. Some products may contain alcohol. Ask your care team if this medication contains alcohol. Be sure to tell all care teams you are taking this medicine. Certain medications, like metronidazole and disulfiram, can cause an unpleasant reaction when taken with alcohol. The reaction includes flushing, headache, nausea, vomiting, sweating, and increased thirst. The reaction can last from 30 minutes to several hours. This medication may affect your coordination, reaction time, or judgement. Do not drive or operate machinery until you know how this medication affects you. Sit up or stand slowly to reduce the risk of dizzy or fainting spells. Drinking alcohol with this medication can increase the risk of these side effects. Talk to your care team about  your risk of cancer. You may be more at risk for certain types of cancer if you take this medication. Talk to your care team if you wish to become pregnant or think you might be pregnant. This medication can cause serious birth defects if taken during pregnancy or if you get pregnant within 2 months after stopping therapy. A negative pregnancy test is required before starting this medication. A reliable form of contraception is recommended while taking this medication and for 2 months after stopping it. Talk to your care team about reliable forms of contraception. Do not breast-feed while taking this medication and for 1 week after stopping therapy. Use a condom during sex and for 4 months after stopping therapy. Tell your care team right away if you think your partner might be pregnant. This medication can cause serious birth defects. This medication may cause infertility. Talk to your care team if you are concerned about your  fertility. What side effects may I notice from receiving this medication? Side effects that you should report to your care team as soon as possible: Allergic reactions--skin rash, itching, hives, swelling of the face, lips, tongue, or throat Change in vision such as blurry vision, seeing halos around lights, vision loss Infection--fever, chills, cough, or sore throat Infusion reactions--chest pain, shortness of breath or trouble breathing, feeling faint or lightheaded Low red blood cell level--unusual weakness or fatigue, dizziness, headache, trouble breathing Pain, tingling, or numbness in the hands or feet Painful swelling, warmth, or redness of the skin, blisters or sores at the infusion site Redness, blistering, peeling, or loosening of the skin, including inside the mouth Sudden or severe stomach pain, bloody diarrhea, fever, nausea, vomiting Swelling of the ankles, hands, or feet Tumor lysis syndrome (TLS)--nausea, vomiting, diarrhea, decrease in the amount of urine,  dark urine, unusual weakness or fatigue, confusion, muscle pain or cramps, fast or irregular heartbeat, joint pain Unusual bruising or bleeding Side effects that usually do not require medical attention (report to your care team if they continue or are bothersome): Change in nail shape, thickness, or color Change in taste Hair loss Increased tears This list may not describe all possible side effects. Call your doctor for medical advice about side effects. You may report side effects to FDA at 1-800-FDA-1088. Where should I keep my medication? This medication is given in a hospital or clinic. It will not be stored at home. NOTE: This sheet is a summary. It may not cover all possible information. If you have questions about this medicine, talk to your doctor, pharmacist, or health care provider.  2023 Elsevier/Gold Standard (2007-06-02 00:00:00)

## 2022-07-12 NOTE — Progress Notes (Signed)
Lauren Salazar [phone]   Fax:(336) [phone]  PROGRESS NOTE  Patient Care Team: [facility] as PCP - General (Family Medicine)  Hematological/Oncological History # Metastatic Adenosquamous Lung Cancer  05/10/2022: last visit with Dr. Alen Blew 05/26/2022: CT scan showed enlargement of RLL mass, right iliac crest mass with large soft tissue components extending in the iliacus and gluteus musculature. Additionally new pathologic right external iliac adenopathy.  06/07/2022: transition care to Dr. Lorenso Courier  07/12/2022: Cycle 1 Day 1 of docetaxel and ramucirumab  Interval History:  Lauren Salazar 62 y.o. female with medical history significant for metastatic adenosquamous lung cancer presents for a follow up visit. The patient's last visit was on 06/07/2022. In the interim since the last visit she has completed palliative radiation therapy to the bone lesion on her hip.  On exam today Lauren Salazar reports she has had some pain reduction from the radiation therapy.  She reports that she does still have some pain but wants to avoid oxycodone as it "makes her feel loopy".  She reports that she did receive a referral to [facility] for consideration of hernia reduction, however they do not take her insurance and she canceled her visit with them.  She reports that she otherwise has had no issues with shortness of breath, chest pain, or cough.  She denies any fevers, chills, sweats, nausea, vomiting or diarrhea.  A full 10 point ROS was otherwise negative.  She notes that she is willing and able to proceed with chemotherapy treatment today.  MEDICAL HISTORY:  Past Medical History:  Diagnosis Date   Bladder tumor    Cancer ([place])    Complication of anesthesia    slow to wake after spinal fusion, then was "looney" for the following 24 hours    Persistent dry cough    ongoing since feb 2020    SURGICAL HISTORY: Past Surgical History:  Procedure  Laterality Date   CERVICAL FUSION  2005   c5-c6      CYSTOSCOPY WITH INJECTION N/A 02/01/2019   Procedure: CYSTOSCOPY WITH INJECTION;  Surgeon: Alexis Frock, MD;  Location: [facility];  Service: Urology;  Laterality: N/A;   LYMPHADENECTOMY Bilateral 02/01/2019   Procedure: LYMPHADENECTOMY;  Surgeon: Alexis Frock, MD;  Location: [facility];  Service: Urology;  Laterality: Bilateral;   ROBOT ASSISTED LAPAROSCOPIC COMPLETE CYSTECT ILEAL CONDUIT N/A 02/01/2019   Procedure: XI ROBOTIC ASSISTED LAPAROSCOPIC COMPLETE CYSTECTOMY, ILEAL CONDUIT, HYSTERECTOMY WITH BILATERAL SALPINGOOPHORECTOMY;  Surgeon: Alexis Frock, MD;  Location: [facility];  Service: Urology;  Laterality: N/A;  6 HRS   TRANSURETHRAL RESECTION OF BLADDER TUMOR N/A 10/31/2018   Procedure: TRANSURETHRAL RESECTION OF BLADDER TUMOR (TURBT) WITH CYSTOSCOPY/ POSSIBLE INTRAVESICAL GEMCITABINE;  Surgeon: Ceasar Mons, MD;  Location: [facility];  Service: Urology;  Laterality: N/A;    SOCIAL HISTORY: Social History   Socioeconomic History   Marital status: Divorced    Spouse name: Not on file   Number of children: Not on file   Years of education: Not on file   Highest education level: Not on file  Occupational History   Not on file  Tobacco Use   Smoking status: Every Day    Packs/day: 1    Types: Cigarettes   Smokeless tobacco: Never   Tobacco comments:    smoking since age 65 , she is cutting back, using patches, trying to stop. 04/02/19  Vaping Use   Vaping Use: Never used  Substance and Sexual Activity   Alcohol use:  No   Drug use: No   Sexual activity: Not Currently  Other Topics Concern   Not on file  Social History Narrative   Not on file   Social Determinants of Health   Financial Resource Strain: Not on file  Food Insecurity: Not on file  Transportation Needs: No Transportation Needs (04/02/2019)   PRAPARE - Hydrologist (Medical): No    Lack of Transportation (Non-Medical): No   Physical Activity: Not on file  Stress: Not on file  Social Connections: Not on file  Intimate Partner Violence: Not At Risk (04/02/2019)   Humiliation, Afraid, Rape, and Kick questionnaire    Fear of Current or Ex-Partner: No    Emotionally Abused: No    Physically Abused: No    Sexually Abused: No    FAMILY HISTORY: No family history on file.  ALLERGIES:  has No Known Allergies.  MEDICATIONS:  Current Outpatient Medications  Medication Sig Dispense Refill   acetaminophen (TYLENOL) 500 MG tablet Take 1,000 mg by mouth every 8 (eight) hours as needed (pain).     folic acid (FOLVITE) 1 MG tablet TAKE 1 TABLET(1 MG) BY MOUTH DAILY 90 tablet 3   benzonatate (TESSALON) 200 MG capsule TAKE 1 CAPSULE(200 MG) BY MOUTH THREE TIMES DAILY AS NEEDED FOR COUGH 30 capsule 2   chlorpheniramine-HYDROcodone (TUSSIONEX) 10-8 MG/5ML SUER Take 5 mLs by mouth every 12 (twelve) hours as needed for cough. 140 mL 0   gabapentin (NEURONTIN) 300 MG capsule Take 1 capsule (300 mg total) by mouth 3 (three) times daily. Take for significant back pain around the clock. (Patient not taking: Reported on 12/22/2020) 42 capsule 1   guaiFENesin-dextromethorphan (ROBITUSSIN DM) 100-10 MG/5ML syrup Take 5-10 mLs by mouth every 4 (four) hours as needed for cough. 473 mL 0   nicotine (NICODERM CQ - DOSED IN MG/24 HOURS) 21 mg/24hr patch Place 21 mg onto the skin daily. (Patient not taking: Reported on 12/22/2020)     oxyCODONE (OXY IR/ROXICODONE) 5 MG immediate release tablet Take 1 tablet (5 mg total) by mouth every 6 (six) hours as needed for severe pain. (Patient not taking: Reported on 07/12/2022) 30 tablet 0   prochlorperazine (COMPAZINE) 10 MG tablet Take 1 tablet (10 mg total) by mouth every 6 (six) hours as needed for nausea or vomiting. (Patient not taking: Reported on 07/12/2022) 30 tablet 0   sucralfate (CARAFATE) 1 g tablet Dissolve 1 tablet in 10 mL H20 and swallow 30 min prior to meals and bedtime PRN heartburn. 40  tablet 2   No current facility-administered medications for this visit.   Facility-Administered Medications Ordered in Other Visits  Medication Dose Route Frequency Provider Last Rate Last Admin   DOCEtaxel (TAXOTERE) 120 mg in sodium chloride 0.9 % 250 mL chemo infusion  75 mg/m2 (Treatment Plan Recorded) Intravenous Once Orson Slick, MD        REVIEW OF SYSTEMS:   Constitutional: ( - ) fevers, ( - )  chills , ( - ) night sweats Eyes: ( - ) blurriness of vision, ( - ) double vision, ( - ) watery eyes Ears, nose, mouth, throat, and face: ( - ) mucositis, ( - ) sore throat Respiratory: ( - ) cough, ( - ) dyspnea, ( - ) wheezes Cardiovascular: ( - ) palpitation, ( - ) chest discomfort, ( - ) lower extremity swelling Gastrointestinal:  ( - ) nausea, ( - ) heartburn, ( - ) change in bowel habits  Skin: ( - ) abnormal skin rashes Lymphatics: ( - ) new lymphadenopathy, ( - ) easy bruising Neurological: ( - ) numbness, ( - ) tingling, ( - ) new weaknesses Behavioral/Psych: ( - ) mood change, ( - ) new changes  All other systems were reviewed with the patient and are negative.  PHYSICAL EXAMINATION: ECOG PERFORMANCE STATUS: 1 - Symptomatic but completely ambulatory  Vitals:   07/12/22 1142  BP: (!) 135/97  Pulse: (!) 117  Resp: 17  Temp: 98.2 F (36.8 C)  SpO2: 98%   Filed Weights   07/12/22 1142  Weight: 119 lb 8 oz (54.2 kg)    GENERAL: Chronically ill-appearing middle-aged Caucasian female, alert, no distress and comfortable SKIN: skin color, texture, turgor are normal, no rashes or significant lesions EYES: conjunctiva are pink and non-injected, sclera clear LUNGS: clear to auscultation and percussion with normal breathing effort HEART: regular rate & rhythm and no murmurs and no lower extremity edema Musculoskeletal: no cyanosis of digits and no clubbing  PSYCH: alert & oriented x 3, fluent speech NEURO: no focal motor/sensory deficits  LABORATORY DATA:  I have  reviewed the data as listed    Latest Ref Rng & Units 07/12/2022   11:21 AM 06/07/2022   11:14 AM 03/28/2022    1:36 PM  CBC  WBC 4.0 - 10.5 K/uL 7.7  10.1  10.1   Hemoglobin 12.0 - 15.0 g/dL 12.2  13.5  13.3   Hematocrit 36.0 - 46.0 % 37.8  40.2  40.0   Platelets 150 - 400 K/uL 289  418  322        Latest Ref Rng & Units 07/12/2022   11:21 AM 06/07/2022   11:14 AM 05/26/2022    3:10 PM  CMP  Glucose 70 - 99 mg/dL 131  133    BUN 8 - 23 mg/dL 19  21    Creatinine 0.44 - 1.00 mg/dL 0.74  0.86  1.10   Sodium 135 - 145 mmol/L 139  137    Potassium 3.5 - 5.1 mmol/L 4.2  4.3    Chloride 98 - 111 mmol/L 104  103    CO2 22 - 32 mmol/L 26  24    Calcium 8.9 - 10.3 mg/dL 9.0  9.3    Total Protein 6.5 - 8.1 g/dL 7.1  7.2    Total Bilirubin 0.3 - 1.2 mg/dL 0.3  0.3    Alkaline Phos 38 - 126 U/L 84  89    AST 15 - 41 U/L 28  23    ALT 0 - 44 U/L 12  10      RADIOGRAPHIC STUDIES: No results found.  ASSESSMENT & PLAN Lauren Salazar 62 y.o. female with medical history significant for metastatic adenosquamous lung cancer presents for a follow up visit.  She is status post robotic assisted laparoscopic cystectomy and pelvic exoneration including hysterectomy, bilateral salpingo-oophorectomy and ileoconduit diversion completed on February 01, 2019.  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   Radiation therapy with weekly chemotherapy utilizing carboplatin and paclitaxel.  Week 1 of chemotherapy started on May 14, 2019.  Therapy concluded in February 2021.    Durvalumab 10 mg/kg cycle 1 on Sep 03, 2019 every 14 days.  She completed 4 cycles of therapy.   Carboplatin with Alimta and Pembrolizumab started on August 04, 2020.  She completed 3 cycles of therapy in June 2022.   Pembrolizumab 400 mg every 6 weeks started  on December 22, 2020.  Therapy discontinued and March 2023 due to patient's preference.  Progression noted on CT scan from 05/26/2022.   #  Metastatic Adenosquamous Carcinoma of the Lung  --patient has undergone numerous lines of treatment including chemoradiation followed by immunotherapy, subsequent carbo/pem/pem and then monotherapy pembrolizumab --progression of disease noted on 05/26/2022 CT scan. --patient notes she does is considering stopping therapy but wants to try another round of chemotherapy.  Plan: -- today is Cycle 1 Day 1 of Docetaxel/Ramucircumab -- labs today show white blood cell 7.7, hemoglobin 12.2, MCV 90.6, and platelets of 289 --Patient completed palliative radiation therapy to her hip. --RTC Cycle 2 Day 1 Docetaxel/Ramucircumab in 3 weeks.   # Abdominal Pain/ Cancer Related Pain --Scribed oxycodone 5 mg as needed, however patient avoids taking this as it causes her to be "loopy". --Patient completed palliative radiation with radiation oncology.  #Supportive Care -- port placement to be scheduled.  -- zofran 8mg  q8H PRN and compazine 10mg  PO q6H for nausea --pain medication as above.     Orders Placed This Encounter  Procedures   IR IMAGING GUIDED PORT INSERTION    Standing Status:   Future    Standing Expiration Date:   07/12/2023    Order Specific Question:   Reason for Exam (SYMPTOM  OR DIAGNOSIS REQUIRED)    Answer:   requesting port placement, required for chemotherapy for lung cancer    Order Specific Question:   Preferred Imaging Location?    Answer:   [facility]   CBC with Differential (Cancer Center Only)    Standing Status:   Future    Standing Expiration Date:   08/22/2023   CMP ([place] only)    Standing Status:   Future    Standing Expiration Date:   08/22/2023   T4    Standing Status:   Future    Standing Expiration Date:   08/22/2023   TSH    Standing Status:   Future    Standing Expiration Date:   08/22/2023   Total Protein, Urine dipstick    Standing Status:   Future    Standing Expiration Date:   08/22/2023    All questions were answered. The patient knows  to call the clinic with any problems, questions or concerns.  A total of more than 30 minutes were spent on this encounter with face-to-face time and non-face-to-face time, including preparing to see the patient, ordering tests and/or medications, counseling the patient and coordination of care as outlined above.   Lauren Peoples, MD Department of Hematology/Oncology [place] [facility] Phone: [phone] Pager: [phone] Email: [email or web address]  07/12/2022 4:04 PM

## 2022-07-12 NOTE — Patient Instructions (Addendum)
-   continue your pain medication as prescribed - pick up and take mirilax (over the counter med) everyday or constipation, if you have diarrhea you can stop taking it, you should aim for one bowel movement a day - you can pick up and take imodium (over the counter medication) for diarrhea, 2 pills after first diarrhea, and 1 pill after each subsequent event of diarrhea - we will call you next Tuesday to check in and see how things are going - give [place] a call 2-3 days before you need a refill so we have time to get it to the pharmacy

## 2022-07-14 ENCOUNTER — Other Ambulatory Visit: Payer: Self-pay

## 2022-07-14 ENCOUNTER — Inpatient Hospital Stay: Payer: Medicaid Other

## 2022-07-14 VITALS — BP 146/94 | HR 106 | Temp 98.1°F | Resp 20

## 2022-07-14 DIAGNOSIS — Z5111 Encounter for antineoplastic chemotherapy: Secondary | ICD-10-CM | POA: Diagnosis not present

## 2022-07-14 DIAGNOSIS — C3490 Malignant neoplasm of unspecified part of unspecified bronchus or lung: Secondary | ICD-10-CM

## 2022-07-14 MED ORDER — PEGFILGRASTIM-CBQV 6 MG/0.6ML ~~LOC~~ SOSY
6.0000 mg | PREFILLED_SYRINGE | Freq: Once | SUBCUTANEOUS | Status: AC
  Administered 2022-07-14: 6 mg via SUBCUTANEOUS
  Filled 2022-07-14: qty 0.6

## 2022-07-14 NOTE — Patient Instructions (Signed)

## 2022-07-15 NOTE — Progress Notes (Unsigned)
[place]  Telephone:([phone] Fax:(336) [phone]   Name: Lauren Salazar Date: 07/15/2022 MRN: [id number]  DOB: 1960/08/27  Patient Care Team: [facility] as PCP - General (Family Medicine)   I connected with Lauren Salazar on 07/15/22 at 12:30 PM EDT by phone and verified that I am speaking with the correct person using two identifiers.   I discussed the limitations, risks, security and privacy concerns of performing an evaluation and management service by telemedicine and the availability of in-person appointments. I also discussed with the patient that there may be a patient responsible charge related to this service. The patient expressed understanding and agreed to proceed.   Other persons participating in the visit and their role in the encounter: n/a   Patient's location: home  Provider's location: [facility]   Chief Complaint: f/u of symptom management   INTERVAL HISTORY: Lauren Salazar is a 62 y.o. 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 Patient is also status post robotic assisted laparoscopic cystectomy and pelvic exoneration including hysterectomy, bilateral oophorectomy and ileoconduit diversion. Palliative ask to see for symptom and pain management and goals of care.   SOCIAL HISTORY:     reports that she has been smoking cigarettes. She has been smoking an average of 1 pack per day. She has never used smokeless tobacco. She reports that she does not drink alcohol and does not use drugs.  ADVANCE DIRECTIVES:  None on file  CODE STATUS: Full code  PAST MEDICAL HISTORY: Past Medical History:  Diagnosis Date   Bladder tumor    Cancer ([place])    Complication of anesthesia    slow to wake after spinal fusion, then was "looney" for the following 24 hours    Persistent  dry cough    ongoing since feb 2020    ALLERGIES:  has No Known Allergies.  MEDICATIONS:  Current Outpatient Medications  Medication Sig Dispense Refill   acetaminophen (TYLENOL) 500 MG tablet Take 1,000 mg by mouth every 8 (eight) hours as needed (pain).     benzonatate (TESSALON) 200 MG capsule TAKE 1 CAPSULE(200 MG) BY MOUTH THREE TIMES DAILY AS NEEDED FOR COUGH 30 capsule 2   chlorpheniramine-HYDROcodone (TUSSIONEX) 10-8 MG/5ML SUER Take 5 mLs by mouth every 12 (twelve) hours as needed for cough. [id number] mL 0   folic acid (FOLVITE) 1 MG tablet TAKE 1 TABLET(1 MG) BY MOUTH DAILY 90 tablet 3   gabapentin (NEURONTIN) 300 MG capsule Take 1 capsule (300 mg total) by mouth 3 (three) times daily. Take for significant back pain around the clock. (Patient not taking: Reported on 12/22/2020) 42 capsule 1   guaiFENesin-dextromethorphan (ROBITUSSIN DM) 100-10 MG/5ML syrup Take 5-10 mLs by mouth every 4 (four) hours as needed for cough. 473 mL 0   nicotine (NICODERM CQ - DOSED IN MG/24 HOURS) 21 mg/24hr patch Place 21 mg onto the skin daily. (Patient not taking: Reported on 12/22/2020)     oxyCODONE (OXY IR/ROXICODONE) 5 MG immediate release tablet Take 1 tablet (5 mg total) by mouth every 6 (six) hours as needed for severe pain. (Patient not taking: Reported on 07/12/2022) 30 tablet 0   prochlorperazine (COMPAZINE) 10 MG tablet Take 1 tablet (10 mg total) by mouth every 6 (six) hours as needed for nausea or vomiting. (Patient not taking: Reported on 07/12/2022) 30 tablet 0  sucralfate (CARAFATE) 1 g tablet Dissolve 1 tablet in 10 mL H20 and swallow 30 min prior to meals and bedtime PRN heartburn. 40 tablet 2   No current facility-administered medications for this visit.    VITAL SIGNS: There were no vitals taken for this visit. There were no vitals filed for this visit.  Estimated body mass index is 19.29 kg/m as calculated from the following:   Height as of 05/10/22: 5\' 6"  (1.676 m).   Weight as of  07/12/22: 119 lb 8 oz (54.2 kg).   PERFORMANCE STATUS (ECOG) : 1 - Symptomatic but completely ambulatory   Physical Exam General: NAD Cardiovascular: regular rate and rhythm Pulmonary: clear ant fields Abdomen: soft, nontender, + bowel sounds GU: no suprapubic tenderness Extremities: no edema, no joint deformities Skin: no rashes Neurological:   IMPRESSION: *** Neoplasm related pain Jadden endorse ongoing right hip pain.  She has a large extended hernia.  She shares she has a high tolerance for pain but a low tolerance of medications.  Felt her pain was somewhat improved s/p radiation treatments however seems to be reescalating over the past several days.  Rates her pain at worst 8 out of 10.  He has taken Advil and Tylenol for mild pain.  She also has oxycodone 5 mg available for severe pain.  States she does not really like to take the oxycodone except for when she is in intense pain.  Pain decreases to 4-5 out of 10 after taking medications.  She reports this pain level is manageable and she is able to function as she desires.   We discussed her regimen and at this time she wishes to continue without any changes.  She knows of pain continues to be uncontrolled we can further discuss other options.  Patient states she will contact our office as needed.   Constipation    Goals of care    07/12/22- We discussed her current illness and what it means in the larger context of Her on-going co-morbidities. Natural disease trajectory and expectations were discussed.   Lauren Salazar all is scheduled for her initial treatment today.  She is anxious to get started and get it over with.  She openly shares her feelings of being nervous with more concern of the unknown or expectations.  Assured her staff is assessable I will also follow-up with her several days after treatment.   Patient states she has advanced directives.  Her daughter Lauren Salazar is her healthcare power of attorney.  We  discussed Her current illness and what it means in the larger context of Her on-going co-morbidities. Natural disease trajectory and expectations were discussed.  I discussed the importance of continued conversation with family and their medical providers regarding overall plan of care and treatment options, ensuring decisions are within the context of the patients values and GOCs.  PLAN: Established therapeutic relationship. Education provided on palliative's role in collaboration with their Oncology/Radiation team. Patient prefers to continue with current regimen of oxycodone 5 mg every 6 hours as needed.  Has not taken around-the-clock.  Will contact office if pain becomes worse or remains uncontrolled. Tylenol as needed MiraLAX daily We will continue to closely monitor and assist with symptom management as needed. I will plan to see patient back in 2-4 weeks in collaboration to other oncology appointments.  I will plan to give patient a call on next Tuesday for close follow-up.   Patient expressed understanding and was in agreement with this plan. She also understands that  She can call the clinic at any time with any questions, concerns, or complaints.   Any controlled substances utilized were prescribed in the context of palliative care. PDMP has been reviewed.   Time Total: ***  Visit consisted of counseling and education dealing with the complex and emotionally intense issues of symptom management and palliative care in the setting of serious and potentially life-threatening illness.Greater than 50%  of this time was spent counseling and coordinating care related to the above assessment and plan.  Alda Lea, AGPCNP-BC  Palliative Medicine Team/[place]  *Please note that this is a verbal dictation therefore any spelling or grammatical errors are due to the "[employer]" system interpretation.

## 2022-07-19 ENCOUNTER — Encounter: Payer: Self-pay | Admitting: Nurse Practitioner

## 2022-07-19 ENCOUNTER — Inpatient Hospital Stay (HOSPITAL_BASED_OUTPATIENT_CLINIC_OR_DEPARTMENT_OTHER): Payer: Medicaid Other | Admitting: Nurse Practitioner

## 2022-07-19 ENCOUNTER — Other Ambulatory Visit: Payer: Self-pay

## 2022-07-19 DIAGNOSIS — Z515 Encounter for palliative care: Secondary | ICD-10-CM | POA: Diagnosis not present

## 2022-07-19 DIAGNOSIS — G893 Neoplasm related pain (acute) (chronic): Secondary | ICD-10-CM

## 2022-07-19 DIAGNOSIS — R110 Nausea: Secondary | ICD-10-CM

## 2022-07-19 DIAGNOSIS — C3431 Malignant neoplasm of lower lobe, right bronchus or lung: Secondary | ICD-10-CM

## 2022-07-19 MED ORDER — ONDANSETRON HCL 8 MG PO TABS: 8.0000 mg | ORAL_TABLET | Freq: Three times a day (TID) | ORAL | 1 refills | Status: DC | PRN

## 2022-07-19 MED ORDER — PROCHLORPERAZINE MALEATE 10 MG PO TABS: 10.0000 mg | ORAL_TABLET | Freq: Four times a day (QID) | ORAL | 2 refills | Status: DC | PRN

## 2022-07-19 NOTE — Progress Notes (Signed)
Lab orders placed per Anda Kraft, [place] for [facility] appt tomorrow

## 2022-07-19 NOTE — Progress Notes (Unsigned)
Symptom Management Consult Note [facility]    Patient Care Team: [facility] as PCP - General (Family Medicine)    Name / MRN / DOB: Lauren Salazar  [id number]  20-May-1960   Date of visit: 07/20/2022   Chief Complaint/Reason for visit: diarrhea, cough, fatigue   Current Therapy: docetaxel and ramucirumab with udyenca  Last treatment:  Day 1   Cycle 1 on 07/12/22 with G-CSF on 07/14/22   ASSESSMENT & PLAN: Patient is a 62 y.o. 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 Lorenso Courier.  I have viewed most recent oncology note and lab work.    #Metastatic Adenosquamous Lung Cancer  - Next appointment with oncologist is 08/02/22   #Symptom management: cough, diarrhea, nausea pain - Patient nontoxic-appearing.  CBC with leukocytosis of 23, likely related to Udenyca injection.  CMP without significant electrolyte derangement.  Does have elevated alk phos to 142 which is new. - UA collected prior to exam.  Has ileal conduit, negative nitrites and leukocytes although 2150 WBC.  Will follow-up on urine culture.  Will hold off on antibiotics at this time. - Clear lung exam.  No hypoxia.  Patient tachycardic on arrival and is appears clinically dehydrated.  She is given a liter of IV fluids and tachycardia resolved.  Prescription for [name] sent to pharmacy as they have helped her in the past with cough. - Grade 3 diarrhea suspected to be related to Taxotere.  Prescription sent to pharmacy for [name]. - Patient with cancer related pain.  She is asking for pain medicine in clinic although does not want anything narcotic.  Patient given a dose of Toradol.  On reassessment pain has minimally improved.  She will continue to take Tylenol at home.  She has a prescription for oxycodone for severe pain at home if needed as well. - Patient given dose of IV Zofran in clinic.  She was able to tolerate  eating cheese and crackers and drinking water. - Patient will likely need dose reduction for next treatment. Oncologist updated on her symptoms.   Strict ED precautions discussed should symptoms worsen.    Heme/Onc History: Oncology History  Lung cancer ([place])  03/14/2019 Initial Diagnosis   Lung cancer ([place])   05/14/2019 - 06/04/2019 Chemotherapy   The patient had palonosetron (ALOXI) injection 0.25 mg, 0.25 mg, Intravenous,  Once, 4 of 4 cycles Administration: 0.25 mg (05/14/2019), 0.25 mg (05/21/2019), 0.25 mg (05/28/2019), 0.25 mg (06/04/2019) CARBOplatin (PARAPLATIN) 190 mg in sodium chloride 0.9 % 250 mL chemo infusion, 190 mg (102.4 % of original dose 186 mg), Intravenous,  Once, 4 of 4 cycles Dose modification:   (original dose 186 mg, Cycle 1) Administration: 190 mg (05/14/2019), 190 mg (05/21/2019), 200 mg (05/28/2019), 200 mg (06/04/2019) PACLitaxel (TAXOL) 78 mg in sodium chloride 0.9 % 250 mL chemo infusion (</= 80mg /m2), 45 mg/m2 = 78 mg, Intravenous,  Once, 4 of 4 cycles Administration: 78 mg (05/14/2019), 78 mg (05/21/2019), 78 mg (05/28/2019), 78 mg (06/04/2019)  for chemotherapy treatment.    09/03/2019 - 10/15/2019 Chemotherapy    Patient is on Treatment Plan: LUNG CARBOPLATIN / PEMETREXED / PEMBROLIZUMAB Q21D INDUCTION X 4 CYCLES / MAINTENANCE PEMETREXED + PEMBROLIZUMAB      07/22/2020 Cancer Staging   Staging form: Lung, AJCC 8th Edition - Clinical: Stage IIIB (cT4, cN2, cM0) - Signed by Wyatt Portela, MD on 07/22/2020   08/04/2020 - 07/07/2021 Chemotherapy  Patient is on Treatment Plan : LUNG CARBOplatin / Pemetrexed / Pembrolizumab q21d Induction x 4 cycles / Maintenance Pemetrexed + Pembrolizumab     12/22/2020 - 03/28/2022 Chemotherapy   Patient is on Treatment Plan : ANUS Pembrolizumab (400) q42d     07/12/2022 -  Chemotherapy   Patient is on Treatment Plan : LUNG Docetaxel + Ramucirumab q21d      Cancer of lower lobe of right lung ([place])  04/02/2019 Initial Diagnosis   Cancer  of lower lobe of right lung ([place])   04/02/2019 Cancer Staging   Staging form: Lung, AJCC 8th Edition - Clinical stage from 04/02/2019: Stage IIIB (cT4, cN2, cM0) - Signed by [name], MD on 04/02/2019       Interval history-: Lauren Salazar is a 62 y.o. female with oncologic history as above presenting to [facility] today with chief complaint of cough, diarrhea, nausea and pain.  She presents unaccompanied to clinic.  Patient states her symptoms have been ongoing x 1 week.  She feels as if this last week has been the worst one of her life because she feels so poorly.  She has had 2 episodes of emesis in the last 24 hours.  She had an expired antiemetic which she tried taking yesterday.  She has had 7 episodes of diarrhea in the last 24 hours.  She is describes stool as liquid and brown.  She has abdominal cramping preceding bowel movements.  She has been taking Imodium with minimal symptom improvement.  Patient also states she has a nonproductive cough.  She is taking cough drops for this, has not been to the store to get Delsym.  In the past she has taken [employer] and thinks they helped.  Patient denies any fever or chills.  She admits to spending most of the week in bed.  She only drank about 20 ounces of fluids yesterday.  Her appetite is decreased because food tastes bad since having chemo.     ROS  All other systems are reviewed and are negative for acute change except as noted in the HPI.    No Known Allergies   Past Medical History:  Diagnosis Date   Bladder tumor    Cancer ([place])    Complication of anesthesia    slow to wake after spinal fusion, then was "looney" for the following 24 hours    Persistent dry cough    ongoing since feb 2020     Past Surgical History:  Procedure Laterality Date   CERVICAL FUSION  2005   c5-c6      CYSTOSCOPY WITH INJECTION N/A 02/01/2019   Procedure: CYSTOSCOPY WITH INJECTION;  Surgeon: Alexis Frock, MD;  [facility];   Service: Urology;  Laterality: N/A;   LYMPHADENECTOMY Bilateral 02/01/2019   Procedure: LYMPHADENECTOMY;  Surgeon: Alexis Frock, MD;  [facility];  Service: Urology;  Laterality: Bilateral;   ROBOT ASSISTED LAPAROSCOPIC COMPLETE CYSTECT ILEAL CONDUIT N/A 02/01/2019   Procedure: XI ROBOTIC ASSISTED LAPAROSCOPIC COMPLETE CYSTECTOMY, ILEAL CONDUIT, HYSTERECTOMY WITH BILATERAL SALPINGOOPHORECTOMY;  Surgeon: Alexis Frock, MD;  [facility];  Service: Urology;  Laterality: N/A;  6 HRS   TRANSURETHRAL RESECTION OF BLADDER TUMOR N/A 10/31/2018   Procedure: TRANSURETHRAL RESECTION OF BLADDER TUMOR (TURBT) WITH CYSTOSCOPY/ POSSIBLE INTRAVESICAL GEMCITABINE;  Surgeon: Ceasar Mons, MD;  [facility];  Service: Urology;  Laterality: N/A;    Social History   Socioeconomic History   Marital status: Divorced    Spouse name: Not on file  Number of children: Not on file   Years of education: Not on file   Highest education level: Not on file  Occupational History   Not on file  Tobacco Use   Smoking status: Every Day    Packs/day: 1    Types: Cigarettes   Smokeless tobacco: Never   Tobacco comments:    smoking since age 89 , she is cutting back, using patches, trying to stop. 04/02/19  Vaping Use   Vaping Use: Never used  Substance and Sexual Activity   Alcohol use: No   Drug use: No   Sexual activity: Not Currently  Other Topics Concern   Not on file  Social History Narrative   Not on file   Social Determinants of Health   Financial Resource Strain: Not on file  Food Insecurity: Not on file  Transportation Needs: No Transportation Needs (04/02/2019)   PRAPARE - Hydrologist (Medical): No    Lack of Transportation (Non-Medical): No  Physical Activity: Not on file  Stress: Not on file  Social Connections: Not on file  Intimate Partner Violence: Not At Risk (04/02/2019)   Humiliation, Afraid, Rape, and Kick questionnaire    Fear of  Current or Ex-Partner: No    Emotionally Abused: No    Physically Abused: No    Sexually Abused: No    No family history on file.   Current Outpatient Medications:    benzonatate (TESSALON) 100 MG capsule, Take 2 capsules (200 mg total) by mouth 3 (three) times daily as needed for cough., Disp: 20 capsule, Rfl: 2   diphenoxylate-atropine ([name]) 2.5-0.025 MG tablet, Take 1 tablet by mouth 4 (four) times daily as needed for diarrhea or loose stools., Disp: 30 tablet, Rfl: 0   acetaminophen (TYLENOL) 500 MG tablet, Take 1,000 mg by mouth every 8 (eight) hours as needed (pain)., Disp: , Rfl:    chlorpheniramine-HYDROcodone (TUSSIONEX) 10-8 MG/5ML SUER, Take 5 mLs by mouth every 12 (twelve) hours as needed for cough., Disp: [id number] mL, Rfl: 0   folic acid (FOLVITE) 1 MG tablet, TAKE 1 TABLET(1 MG) BY MOUTH DAILY, Disp: 90 tablet, Rfl: 3   gabapentin (NEURONTIN) 300 MG capsule, Take 1 capsule (300 mg total) by mouth 3 (three) times daily. Take for significant back pain around the clock. (Patient not taking: Reported on 12/22/2020), Disp: 42 capsule, Rfl: 1   guaiFENesin-dextromethorphan (ROBITUSSIN DM) 100-10 MG/5ML syrup, Take 5-10 mLs by mouth every 4 (four) hours as needed for cough., Disp: 473 mL, Rfl: 0   nicotine (NICODERM CQ - DOSED IN MG/24 HOURS) 21 mg/24hr patch, Place 21 mg onto the skin daily. (Patient not taking: Reported on 12/22/2020), Disp: , Rfl:    ondansetron (ZOFRAN) 8 MG tablet, Take 1 tablet (8 mg total) by mouth every 8 (eight) hours as needed for nausea or vomiting., Disp: 20 tablet, Rfl: 1   oxyCODONE (OXY IR/ROXICODONE) 5 MG immediate release tablet, Take 1 tablet (5 mg total) by mouth every 6 (six) hours as needed for severe pain. (Patient not taking: Reported on 07/12/2022), Disp: 30 tablet, Rfl: 0   prochlorperazine (COMPAZINE) 10 MG tablet, Take 1 tablet (10 mg total) by mouth every 6 (six) hours as needed for nausea or vomiting., Disp: 30 tablet, Rfl: 2   sucralfate  (CARAFATE) 1 g tablet, Dissolve 1 tablet in 10 mL H20 and swallow 30 min prior to meals and bedtime PRN heartburn., Disp: 40 tablet, Rfl: 2  PHYSICAL EXAM: ECOG FS:1 -  Symptomatic but completely ambulatory    Vitals:   07/20/22 1419 07/20/22 1555  BP: 120/74 110/75  Pulse: (!) 118 (!) 103  Resp: 18 18  Temp: 98.1 F (36.7 C)   TempSrc: Oral   SpO2: 98% 97%  Weight: 117 lb 11.2 oz (53.4 kg)    Physical Exam Vitals and nursing note reviewed.  Constitutional:      Appearance: She is well-developed. She is not ill-appearing or toxic-appearing.  HENT:     Head: Normocephalic.     Nose: Nose normal.     Mouth/Throat:     Mouth: Mucous membranes are dry.     Pharynx: No posterior oropharyngeal erythema.  Eyes:     Conjunctiva/sclera: Conjunctivae normal.  Neck:     Vascular: No JVD.  Cardiovascular:     Rate and Rhythm: Regular rhythm. Tachycardia present.     Pulses: Normal pulses.     Heart sounds: Normal heart sounds.  Pulmonary:     Effort: Pulmonary effort is normal. No respiratory distress.     Breath sounds: Normal breath sounds. No stridor. No wheezing, rhonchi or rales.  Chest:     Chest wall: No tenderness.  Abdominal:     General: There is no distension.     Palpations: Abdomen is soft.     Tenderness: There is no abdominal tenderness.     Comments: Ileal conduit.  Light yellow colored urine in bag  Musculoskeletal:     Cervical back: Normal range of motion.     Right lower leg: No edema.     Left lower leg: No edema.  Skin:    General: Skin is warm and dry.  Neurological:     Mental Status: She is oriented to person, place, and time.        LABORATORY DATA: I have reviewed the data as listed    Latest Ref Rng & Units 07/20/2022    1:51 PM 07/12/2022   11:21 AM 06/07/2022   11:14 AM  CBC  WBC 4.0 - 10.5 K/uL 23.0  7.7  10.1   Hemoglobin 12.0 - 15.0 g/dL 12.0  12.2  13.5   Hematocrit 36.0 - 46.0 % 36.1  37.8  40.2   Platelets 150 - 400 K/uL 217   289  418         Latest Ref Rng & Units 07/20/2022    1:51 PM 07/12/2022   11:21 AM 06/07/2022   11:14 AM  CMP  Glucose 70 - 99 mg/dL 124  131  133   BUN 8 - 23 mg/dL 21  19  21    Creatinine 0.44 - 1.00 mg/dL 0.78  0.74  0.86   Sodium 135 - 145 mmol/L 136  139  137   Potassium 3.5 - 5.1 mmol/L 4.1  4.2  4.3   Chloride 98 - 111 mmol/L 102  104  103   CO2 22 - 32 mmol/L 26  26  24    Calcium 8.9 - 10.3 mg/dL 8.8  9.0  9.3   Total Protein 6.5 - 8.1 g/dL 6.7  7.1  7.2   Total Bilirubin 0.3 - 1.2 mg/dL 0.2  0.3  0.3   Alkaline Phos 38 - 126 U/L 142  84  89   AST 15 - 41 U/L 42  28  23   ALT 0 - 44 U/L 31  12  10         RADIOGRAPHIC STUDIES (from last 24 hours if applicable) I have personally  reviewed the radiological images as listed and agreed with the findings in the report. No results found.      Visit Diagnosis: 1. Nausea   2. Neoplasm related pain   3. Malignant neoplasm of lung, unspecified laterality, unspecified part of lung ([place])   4. Subacute cough      No orders of the defined types were placed in this encounter.   All questions were answered. The patient knows to call the clinic with any problems, questions or concerns. No barriers to learning was detected.  I have spent a total of 30 minutes minutes of face-to-face and non-face-to-face time, preparing to see the patient, obtaining and/or reviewing separately obtained history, performing a medically appropriate examination, counseling and educating the patient, ordering tests, documenting clinical information in the electronic health record, and care coordination (communications with other health care professionals or caregivers).    Thank you for allowing me to participate in the care of this patient.    Barrie Folk, PA-C Department of Hematology/Oncology [facility] at [facility] Phone: [phone]  [phone]    07/20/2022 4:25 PM

## 2022-07-20 ENCOUNTER — Inpatient Hospital Stay: Payer: Medicaid Other

## 2022-07-20 ENCOUNTER — Encounter: Payer: Self-pay | Admitting: Hematology and Oncology

## 2022-07-20 ENCOUNTER — Inpatient Hospital Stay (HOSPITAL_BASED_OUTPATIENT_CLINIC_OR_DEPARTMENT_OTHER): Payer: Medicaid Other | Admitting: Physician Assistant

## 2022-07-20 ENCOUNTER — Other Ambulatory Visit: Payer: Self-pay

## 2022-07-20 VITALS — BP 110/75 | HR 103 | Temp 98.1°F | Resp 18 | Wt 117.7 lb

## 2022-07-20 DIAGNOSIS — C3431 Malignant neoplasm of lower lobe, right bronchus or lung: Secondary | ICD-10-CM

## 2022-07-20 DIAGNOSIS — G893 Neoplasm related pain (acute) (chronic): Secondary | ICD-10-CM | POA: Diagnosis not present

## 2022-07-20 DIAGNOSIS — C3490 Malignant neoplasm of unspecified part of unspecified bronchus or lung: Secondary | ICD-10-CM

## 2022-07-20 DIAGNOSIS — Z5111 Encounter for antineoplastic chemotherapy: Secondary | ICD-10-CM | POA: Diagnosis not present

## 2022-07-20 DIAGNOSIS — R110 Nausea: Secondary | ICD-10-CM | POA: Diagnosis not present

## 2022-07-20 DIAGNOSIS — R052 Subacute cough: Secondary | ICD-10-CM

## 2022-07-20 LAB — CBC WITH DIFFERENTIAL (CANCER CENTER ONLY)
Abs Immature Granulocytes: 1.63 10*3/uL — ABNORMAL HIGH (ref 0.00–0.07)
Basophils Absolute: 0 10*3/uL (ref 0.0–0.1)
Basophils Relative: 0 %
Eosinophils Absolute: 0.1 10*3/uL (ref 0.0–0.5)
Eosinophils Relative: 0 %
HCT: 36.1 % (ref 36.0–46.0)
Hemoglobin: 12 g/dL (ref 12.0–15.0)
Immature Granulocytes: 7 %
Lymphocytes Relative: 3 %
Lymphs Abs: 0.6 10*3/uL — ABNORMAL LOW (ref 0.7–4.0)
MCH: 29.1 pg (ref 26.0–34.0)
MCHC: 33.2 g/dL (ref 30.0–36.0)
MCV: 87.6 fL (ref 80.0–100.0)
Monocytes Absolute: 2.4 10*3/uL — ABNORMAL HIGH (ref 0.1–1.0)
Monocytes Relative: 10 %
Neutro Abs: 18.2 10*3/uL — ABNORMAL HIGH (ref 1.7–7.7)
Neutrophils Relative %: 80 %
Platelet Count: 217 10*3/uL (ref 150–400)
RBC: 4.12 MIL/uL (ref 3.87–5.11)
RDW: 16.3 % — ABNORMAL HIGH (ref 11.5–15.5)
WBC Count: 23 10*3/uL — ABNORMAL HIGH (ref 4.0–10.5)
nRBC: 0.3 % — ABNORMAL HIGH (ref 0.0–0.2)

## 2022-07-20 LAB — URINALYSIS, COMPLETE (UACMP) WITH MICROSCOPIC
Bilirubin Urine: NEGATIVE
Glucose, UA: NEGATIVE mg/dL
Hgb urine dipstick: NEGATIVE
Ketones, ur: NEGATIVE mg/dL
Leukocytes,Ua: NEGATIVE
Nitrite: NEGATIVE
Protein, ur: >=300 mg/dL — AB
Specific Gravity, Urine: 1.015 (ref 1.005–1.030)
pH: 8 (ref 5.0–8.0)

## 2022-07-20 LAB — CMP (CANCER CENTER ONLY)
ALT: 31 U/L (ref 0–44)
AST: 42 U/L — ABNORMAL HIGH (ref 15–41)
Albumin: 3 g/dL — ABNORMAL LOW (ref 3.5–5.0)
Alkaline Phosphatase: 142 U/L — ABNORMAL HIGH (ref 38–126)
Anion gap: 8 (ref 5–15)
BUN: 21 mg/dL (ref 8–23)
CO2: 26 mmol/L (ref 22–32)
Calcium: 8.8 mg/dL — ABNORMAL LOW (ref 8.9–10.3)
Chloride: 102 mmol/L (ref 98–111)
Creatinine: 0.78 mg/dL (ref 0.44–1.00)
GFR, Estimated: >60 mL/min (ref >60)
Glucose, Bld: 124 mg/dL — ABNORMAL HIGH (ref 70–99)
Potassium: 4.1 mmol/L (ref 3.5–5.1)
Sodium: 136 mmol/L (ref 135–145)
Total Bilirubin: 0.2 mg/dL — ABNORMAL LOW (ref 0.3–1.2)
Total Protein: 6.7 g/dL (ref 6.5–8.1)

## 2022-07-20 LAB — MAGNESIUM: Magnesium: 1.8 mg/dL (ref 1.7–2.4)

## 2022-07-20 MED ORDER — DIPHENOXYLATE-ATROPINE 2.5-0.025 MG PO TABS: 1.0000 | ORAL_TABLET | Freq: Four times a day (QID) | ORAL | 0 refills | Status: DC | PRN

## 2022-07-20 MED ORDER — BENZONATATE 100 MG PO CAPS: 200.0000 mg | ORAL_CAPSULE | Freq: Three times a day (TID) | ORAL | 2 refills | Status: DC | PRN

## 2022-07-20 MED ORDER — SODIUM CHLORIDE 0.9 % IV SOLN: 8.0000 mg | Freq: Once | INTRAVENOUS | Status: DC

## 2022-07-20 MED ORDER — KETOROLAC TROMETHAMINE 30 MG/ML IJ SOLN
30.0000 mg | Freq: Once | INTRAMUSCULAR | Status: AC
  Administered 2022-07-20: 30 mg via INTRAVENOUS
  Filled 2022-07-20: qty 1

## 2022-07-20 MED ORDER — ONDANSETRON HCL 4 MG/2ML IJ SOLN
8.0000 mg | Freq: Once | INTRAMUSCULAR | Status: AC
  Administered 2022-07-20: 8 mg via INTRAVENOUS
  Filled 2022-07-20: qty 4

## 2022-07-20 MED ORDER — SODIUM CHLORIDE 0.9 % IV SOLN: Freq: Once | INTRAVENOUS | Status: AC

## 2022-07-22 ENCOUNTER — Other Ambulatory Visit: Payer: Self-pay | Admitting: Student

## 2022-07-22 DIAGNOSIS — C3431 Malignant neoplasm of lower lobe, right bronchus or lung: Secondary | ICD-10-CM

## 2022-07-23 LAB — URINE CULTURE: Culture: >=100000 — AB

## 2022-07-23 NOTE — H&P (Signed)
Referring Physician(s): Dorsey,John T IV  Supervising Physician: Arne Cleveland  Patient Status:  [facility]  Chief Complaint:  "I'm getting a port a cath"  Subjective:  Pt known to IR team from right lower lung nodule biopsy in 2020. She is a 62 yo female smoker with PMH bladder cancer and metastatic adenosquamous carcinoma of lung 2020. She is s/p robotic assisted laparoscopic cystectomy and pelvic exoneration including hysterectomy, bilateral salpingo-oophorectomy and ileoconduit diversion completed on February 01, 2019.  She has undergone prior chemoradiation/immunotherapy . She now has evidence of disease progression and presents today for port a cath placement to assist with treatment.  She denies fever,HA,CP, worsening dyspnea, abd pain, back pain,N/V or bleeding; she does have chronic cough.   Past Medical History:  Diagnosis Date   Bladder tumor    Cancer ([place])    Complication of anesthesia    slow to wake after spinal fusion, then was "looney" for the following 24 hours    Persistent dry cough    ongoing since feb 2020   Past Surgical History:  Procedure Laterality Date   CERVICAL FUSION  2005   c5-c6      CYSTOSCOPY WITH INJECTION N/A 02/01/2019   Procedure: CYSTOSCOPY WITH INJECTION;  Surgeon: Alexis Frock, MD;  [facility];  Service: Urology;  Laterality: N/A;   LYMPHADENECTOMY Bilateral 02/01/2019   Procedure: LYMPHADENECTOMY;  Surgeon: Alexis Frock, MD;  [facility];  Service: Urology;  Laterality: Bilateral;   ROBOT ASSISTED LAPAROSCOPIC COMPLETE CYSTECT ILEAL CONDUIT N/A 02/01/2019   Procedure: XI ROBOTIC ASSISTED LAPAROSCOPIC COMPLETE CYSTECTOMY, ILEAL CONDUIT, HYSTERECTOMY WITH BILATERAL SALPINGOOPHORECTOMY;  Surgeon: Alexis Frock, MD;  [facility];  Service: Urology;  Laterality: N/A;  6 HRS   TRANSURETHRAL RESECTION OF BLADDER TUMOR N/A 10/31/2018   Procedure: TRANSURETHRAL RESECTION OF BLADDER TUMOR (TURBT) WITH CYSTOSCOPY/ POSSIBLE  INTRAVESICAL GEMCITABINE;  Surgeon: Ceasar Mons, MD;  [facility];  Service: Urology;  Laterality: N/A;        Past Medical History:  Diagnosis Date   Bladder tumor    Cancer ([place])    Complication of anesthesia    slow to wake after spinal fusion, then was "looney" for the following 24 hours    Persistent dry cough    ongoing since feb 2020   Past Surgical History:  Procedure Laterality Date   CERVICAL FUSION  2005   c5-c6      CYSTOSCOPY WITH INJECTION N/A 02/01/2019   Procedure: CYSTOSCOPY WITH INJECTION;  Surgeon: Alexis Frock, MD;  [facility];  Service: Urology;  Laterality: N/A;   LYMPHADENECTOMY Bilateral 02/01/2019   Procedure: LYMPHADENECTOMY;  Surgeon: Alexis Frock, MD;  [facility];  Service: Urology;  Laterality: Bilateral;   ROBOT ASSISTED LAPAROSCOPIC COMPLETE CYSTECT ILEAL CONDUIT N/A 02/01/2019   Procedure: XI ROBOTIC ASSISTED LAPAROSCOPIC COMPLETE CYSTECTOMY, ILEAL CONDUIT, HYSTERECTOMY WITH BILATERAL SALPINGOOPHORECTOMY;  Surgeon: Alexis Frock, MD;  [facility];  Service: Urology;  Laterality: N/A;  6 HRS   TRANSURETHRAL RESECTION OF BLADDER TUMOR N/A 10/31/2018   Procedure: TRANSURETHRAL RESECTION OF BLADDER TUMOR (TURBT) WITH CYSTOSCOPY/ POSSIBLE INTRAVESICAL GEMCITABINE;  Surgeon: Ceasar Mons, MD;  [facility];  Service: Urology;  Laterality: N/A;              Allergies: Patient has no known allergies.  Medications: Prior to Admission medications   Medication Sig Start Date End Date Taking? Authorizing Provider  acetaminophen (TYLENOL) 500 MG tablet Take 1,000 mg by mouth every 8 (  eight) hours as needed (pain).    [provider]  benzonatate (TESSALON) 100 MG capsule Take 2 capsules (200 mg total) by mouth 3 (three) times daily as needed for cough. 07/20/22   Walisiewicz, Harley Hallmark, PA-C  chlorpheniramine-HYDROcodone (TUSSIONEX) 10-8 MG/5ML SUER Take 5 mLs by mouth every 12 (twelve)  hours as needed for cough. 09/17/19   Tanner, Lyndon Code., PA-C  diphenoxylate-atropine ([name]) 2.5-0.025 MG tablet Take 1 tablet by mouth 4 (four) times daily as needed for diarrhea or loose stools. 07/20/22   Walisiewicz, Verline Lema E, PA-C  folic acid (FOLVITE) 1 MG tablet TAKE 1 TABLET(1 MG) BY MOUTH DAILY 08/16/21   Wyatt Portela, MD  gabapentin (NEURONTIN) 300 MG capsule Take 1 capsule (300 mg total) by mouth 3 (three) times daily. Take for significant back pain around the clock. Patient not taking: Reported on 12/22/2020 06/18/19   [name], MD  guaiFENesin-dextromethorphan [facility] DM) 100-10 MG/5ML syrup Take 5-10 mLs by mouth every 4 (four) hours as needed for cough. 08/07/19   [name], MD  nicotine (NICODERM CQ - DOSED IN MG/24 HOURS) 21 mg/24hr patch Place 21 mg onto the skin daily. Patient not taking: Reported on 12/22/2020    [provider]  ondansetron (ZOFRAN) 8 MG tablet Take 1 tablet (8 mg total) by mouth every 8 (eight) hours as needed for nausea or vomiting. 07/19/22   Pickenpack-Cousar, Carlena Sax, NP  oxyCODONE (OXY IR/ROXICODONE) 5 MG immediate release tablet Take 1 tablet (5 mg total) by mouth every 6 (six) hours as needed for severe pain. Patient not taking: Reported on 07/12/2022 06/07/22   Orson Slick, MD  prochlorperazine (COMPAZINE) 10 MG tablet Take 1 tablet (10 mg total) by mouth every 6 (six) hours as needed for nausea or vomiting. 07/19/22   Pickenpack-Cousar, Carlena Sax, NP  sucralfate (CARAFATE) 1 g tablet Dissolve 1 tablet in 10 mL H20 and swallow 30 min prior to meals and bedtime PRN heartburn. 06/17/19   [name], MD     Vital Signs:pending     Code Status: FULL CODE  Physical Exam: awake/alert; chest- distant BS bilat , dim at bases; heart- RRR; abd- soft,+BS,NT; trace to 1+ LE edema; RLQ urostomy with yellow urine  Imaging: No results found.  Labs:  CBC: Recent Labs    03/28/22 1336 06/07/22 1114 07/12/22 1121 07/20/22 1351   WBC 10.1 10.1 7.7 23.0*  HGB 13.3 13.5 12.2 12.0  HCT 40.0 40.2 37.8 36.1  PLT 322 418* 289 217    COAGS: No results for input(s): "INR", "APTT" in the last 8760 hours.  BMP: Recent Labs    03/28/22 1336 05/26/22 1510 06/07/22 1114 07/12/22 1121 07/20/22 1351  NA 138  --  137 139 136  K 3.9  --  4.3 4.2 4.1  CL 103  --  103 104 102  CO2 24  --  24 26 26   GLUCOSE 174*  --  133* 131* 124*  BUN 23  --  21 19 21   CALCIUM 9.4  --  9.3 9.0 8.8*  CREATININE 0.96 1.10* 0.86 0.74 0.78  GFRNONAA >60  --  >60 >60 >60    LIVER FUNCTION TESTS: Recent Labs    03/28/22 1336 06/07/22 1114 07/12/22 1121 07/20/22 1351  BILITOT 0.3 0.3 0.3 0.2*  AST 16 23 28  42*  ALT 9 10 12 31   ALKPHOS 78 89 84 142*  PROT 7.2 7.2 7.1 6.7  ALBUMIN 3.9 3.5 3.3* 3.0*  Assessment and Plan: 62 yo female smoker with PMH bladder cancer and metastatic adenosquamous carcinoma of lung 2020. She is s/p robotic assisted laparoscopic cystectomy and pelvic exoneration including hysterectomy, bilateral salpingo-oophorectomy and ileoconduit diversion completed on February 01, 2019.  She has undergone prior chemoradiation/immunotherapy . She now has evidence of disease progression and presents today for port a cath placement to assist with treatment.  Risks and benefits of image guided port-a-catheter placement was discussed with the patient including, but not limited to bleeding, infection, pneumothorax, or fibrin sheath development and need for additional procedures.  All of the patient's questions were answered, patient is agreeable to proceed. Consent signed and in chart.     Electronically Signed: D. Rowe Robert, PA-C 07/23/2022, 4:52 PM   I spent a total of 25 minutes at the the patient's bedside AND on the patient's hospital floor or unit, greater than 50% of which was counseling/coordinating care for port a cath placement.

## 2022-07-25 ENCOUNTER — Ambulatory Visit (HOSPITAL_COMMUNITY)
Admission: RE | Admit: 2022-07-25 | Discharge: 2022-07-25 | Disposition: A | Discharge status: Home / Self Care | Payer: Medicaid Other | Source: Ambulatory Visit | Attending: Hematology and Oncology | Admitting: Hematology and Oncology

## 2022-07-25 ENCOUNTER — Encounter (HOSPITAL_COMMUNITY): Payer: Self-pay

## 2022-07-25 DIAGNOSIS — F17200 Nicotine dependence, unspecified, uncomplicated: Secondary | ICD-10-CM | POA: Diagnosis not present

## 2022-07-25 DIAGNOSIS — Z923 Personal history of irradiation: Secondary | ICD-10-CM | POA: Diagnosis not present

## 2022-07-25 DIAGNOSIS — Z8551 Personal history of malignant neoplasm of bladder: Secondary | ICD-10-CM | POA: Diagnosis not present

## 2022-07-25 DIAGNOSIS — R053 Chronic cough: Secondary | ICD-10-CM | POA: Diagnosis not present

## 2022-07-25 DIAGNOSIS — Z9221 Personal history of antineoplastic chemotherapy: Secondary | ICD-10-CM | POA: Insufficient documentation

## 2022-07-25 DIAGNOSIS — C3490 Malignant neoplasm of unspecified part of unspecified bronchus or lung: Secondary | ICD-10-CM | POA: Diagnosis present

## 2022-07-25 DIAGNOSIS — C3431 Malignant neoplasm of lower lobe, right bronchus or lung: Secondary | ICD-10-CM

## 2022-07-25 HISTORY — PX: IR IMAGING GUIDED PORT INSERTION: IMG5740

## 2022-07-25 MED ORDER — LIDOCAINE-EPINEPHRINE 1 %-1:100000 IJ SOLN
INTRAMUSCULAR | Status: AC
  Administered 2022-07-25: 20 mL via INTRADERMAL
  Filled 2022-07-25: qty 1

## 2022-07-25 MED ORDER — FENTANYL CITRATE (PF) 100 MCG/2ML IJ SOLN
INTRAMUSCULAR | Status: AC | PRN
  Administered 2022-07-25 (×2): 50 ug via INTRAVENOUS

## 2022-07-25 MED ORDER — MIDAZOLAM HCL 2 MG/2ML IJ SOLN
INTRAMUSCULAR | Status: AC
  Filled 2022-07-25: qty 4

## 2022-07-25 MED ORDER — HEPARIN SOD (PORK) LOCK FLUSH 100 UNIT/ML IV SOLN
INTRAVENOUS | Status: AC
  Administered 2022-07-25: 500 [IU]
  Filled 2022-07-25: qty 5

## 2022-07-25 MED ORDER — MIDAZOLAM HCL 2 MG/2ML IJ SOLN
INTRAMUSCULAR | Status: AC | PRN
  Administered 2022-07-25 (×3): 1 mg via INTRAVENOUS

## 2022-07-25 MED ORDER — HEPARIN SOD (PORK) LOCK FLUSH 100 UNIT/ML IV SOLN
INTRAVENOUS | Status: AC | PRN
  Administered 2022-07-25: 500 [IU] via INTRAVENOUS

## 2022-07-25 MED ORDER — FENTANYL CITRATE (PF) 100 MCG/2ML IJ SOLN
INTRAMUSCULAR | Status: AC
  Filled 2022-07-25: qty 2

## 2022-07-25 MED ORDER — SODIUM CHLORIDE 0.9 % IV SOLN: INTRAVENOUS | Status: DC

## 2022-07-25 NOTE — Procedures (Signed)
  Procedure:  R IJ port catheter placement   Preprocedure diagnosis: Diagnoses of Malignant neoplasm of lung, unspecified laterality, unspecified part of lung and Cancer of lower lobe of right lung ([place]) were pertinent to this visit. Postprocedure diagnosis: same EBL:    minimal Complications:   none immediate  See full dictation in [employer].  Dillard Cannon MD Main # [phone] Pager  [phone] Mobile [phone]

## 2022-07-25 NOTE — Discharge Instructions (Signed)

## 2022-08-01 MED FILL — Dexamethasone Sodium Phosphate Inj 100 MG/10ML: INTRAMUSCULAR | Qty: 1 | Status: AC

## 2022-08-02 ENCOUNTER — Inpatient Hospital Stay: Payer: Medicaid Other

## 2022-08-02 ENCOUNTER — Ambulatory Visit: Payer: Self-pay | Admitting: Radiation Oncology

## 2022-08-02 ENCOUNTER — Inpatient Hospital Stay (HOSPITAL_BASED_OUTPATIENT_CLINIC_OR_DEPARTMENT_OTHER): Payer: Medicaid Other | Admitting: Physician Assistant

## 2022-08-02 ENCOUNTER — Inpatient Hospital Stay: Payer: Medicaid Other | Attending: Oncology

## 2022-08-02 ENCOUNTER — Other Ambulatory Visit: Payer: Self-pay

## 2022-08-02 ENCOUNTER — Inpatient Hospital Stay (HOSPITAL_BASED_OUTPATIENT_CLINIC_OR_DEPARTMENT_OTHER): Payer: Medicaid Other | Admitting: Nurse Practitioner

## 2022-08-02 VITALS — BP 153/98 | HR 98

## 2022-08-02 VITALS — BP 150/95 | HR 117 | Temp 97.3°F | Resp 17 | Wt 120.6 lb

## 2022-08-02 DIAGNOSIS — Z923 Personal history of irradiation: Secondary | ICD-10-CM | POA: Diagnosis not present

## 2022-08-02 DIAGNOSIS — R530 Neoplastic (malignant) related fatigue: Secondary | ICD-10-CM

## 2022-08-02 DIAGNOSIS — Z515 Encounter for palliative care: Secondary | ICD-10-CM

## 2022-08-02 DIAGNOSIS — Z5111 Encounter for antineoplastic chemotherapy: Secondary | ICD-10-CM

## 2022-08-02 DIAGNOSIS — Z79899 Other long term (current) drug therapy: Secondary | ICD-10-CM | POA: Diagnosis not present

## 2022-08-02 DIAGNOSIS — F17210 Nicotine dependence, cigarettes, uncomplicated: Secondary | ICD-10-CM | POA: Insufficient documentation

## 2022-08-02 DIAGNOSIS — L89322 Pressure ulcer of left buttock, stage 2: Secondary | ICD-10-CM | POA: Insufficient documentation

## 2022-08-02 DIAGNOSIS — Z5189 Encounter for other specified aftercare: Secondary | ICD-10-CM | POA: Diagnosis not present

## 2022-08-02 DIAGNOSIS — R197 Diarrhea, unspecified: Secondary | ICD-10-CM | POA: Diagnosis not present

## 2022-08-02 DIAGNOSIS — C771 Secondary and unspecified malignant neoplasm of intrathoracic lymph nodes: Secondary | ICD-10-CM | POA: Insufficient documentation

## 2022-08-02 DIAGNOSIS — C3431 Malignant neoplasm of lower lobe, right bronchus or lung: Secondary | ICD-10-CM | POA: Insufficient documentation

## 2022-08-02 DIAGNOSIS — R110 Nausea: Secondary | ICD-10-CM | POA: Diagnosis not present

## 2022-08-02 DIAGNOSIS — C7951 Secondary malignant neoplasm of bone: Secondary | ICD-10-CM | POA: Diagnosis not present

## 2022-08-02 DIAGNOSIS — C3490 Malignant neoplasm of unspecified part of unspecified bronchus or lung: Secondary | ICD-10-CM

## 2022-08-02 DIAGNOSIS — R630 Anorexia: Secondary | ICD-10-CM

## 2022-08-02 DIAGNOSIS — G893 Neoplasm related pain (acute) (chronic): Secondary | ICD-10-CM | POA: Insufficient documentation

## 2022-08-02 DIAGNOSIS — M2550 Pain in unspecified joint: Secondary | ICD-10-CM | POA: Insufficient documentation

## 2022-08-02 LAB — CBC WITH DIFFERENTIAL (CANCER CENTER ONLY)
Abs Immature Granulocytes: 0.1 10*3/uL — ABNORMAL HIGH (ref 0.00–0.07)
Basophils Absolute: 0.1 10*3/uL (ref 0.0–0.1)
Basophils Relative: 1 %
Eosinophils Absolute: 0.1 10*3/uL (ref 0.0–0.5)
Eosinophils Relative: 0 %
HCT: 37.2 % (ref 36.0–46.0)
Hemoglobin: 12.1 g/dL (ref 12.0–15.0)
Immature Granulocytes: 1 %
Lymphocytes Relative: 4 %
Lymphs Abs: 0.6 10*3/uL — ABNORMAL LOW (ref 0.7–4.0)
MCH: 30.1 pg (ref 26.0–34.0)
MCHC: 32.5 g/dL (ref 30.0–36.0)
MCV: 92.5 fL (ref 80.0–100.0)
Monocytes Absolute: 1.5 10*3/uL — ABNORMAL HIGH (ref 0.1–1.0)
Monocytes Relative: 10 %
Neutro Abs: 11.8 10*3/uL — ABNORMAL HIGH (ref 1.7–7.7)
Neutrophils Relative %: 84 %
Platelet Count: 430 10*3/uL — ABNORMAL HIGH (ref 150–400)
RBC: 4.02 MIL/uL (ref 3.87–5.11)
RDW: 20.2 % — ABNORMAL HIGH (ref 11.5–15.5)
WBC Count: 14.1 10*3/uL — ABNORMAL HIGH (ref 4.0–10.5)
nRBC: 0.1 % (ref 0.0–0.2)

## 2022-08-02 LAB — CMP (CANCER CENTER ONLY)
ALT: 11 U/L (ref 0–44)
AST: 17 U/L (ref 15–41)
Albumin: 3.2 g/dL — ABNORMAL LOW (ref 3.5–5.0)
Alkaline Phosphatase: 102 U/L (ref 38–126)
Anion gap: 7 (ref 5–15)
BUN: 15 mg/dL (ref 8–23)
CO2: 29 mmol/L (ref 22–32)
Calcium: 9.1 mg/dL (ref 8.9–10.3)
Chloride: 102 mmol/L (ref 98–111)
Creatinine: 0.78 mg/dL (ref 0.44–1.00)
GFR, Estimated: >60 mL/min (ref >60)
Glucose, Bld: 120 mg/dL — ABNORMAL HIGH (ref 70–99)
Potassium: 3.9 mmol/L (ref 3.5–5.1)
Sodium: 138 mmol/L (ref 135–145)
Total Bilirubin: 0.3 mg/dL (ref 0.3–1.2)
Total Protein: 7.1 g/dL (ref 6.5–8.1)

## 2022-08-02 LAB — TOTAL PROTEIN, URINE DIPSTICK: Protein, ur: 30 mg/dL — AB

## 2022-08-02 LAB — TSH: TSH: 4.977 u[IU]/mL — ABNORMAL HIGH (ref 0.350–4.500)

## 2022-08-02 MED ORDER — SODIUM CHLORIDE 0.9 % IV SOLN: Freq: Once | INTRAVENOUS | Status: AC

## 2022-08-02 MED ORDER — SODIUM CHLORIDE 0.9 % IV SOLN
60.0000 mg/m2 | Freq: Once | INTRAVENOUS | Status: AC
  Administered 2022-08-02: 96 mg via INTRAVENOUS
  Filled 2022-08-02: qty 9.6

## 2022-08-02 MED ORDER — SODIUM CHLORIDE 0.9 % IV SOLN
10.0000 mg | Freq: Once | INTRAVENOUS | Status: AC
  Administered 2022-08-02: 10 mg via INTRAVENOUS
  Filled 2022-08-02: qty 10

## 2022-08-02 MED ORDER — SODIUM CHLORIDE 0.9 % IV SOLN
10.0000 mg/kg | Freq: Once | INTRAVENOUS | Status: AC
  Administered 2022-08-02: 600 mg via INTRAVENOUS
  Filled 2022-08-02: qty 10

## 2022-08-02 MED ORDER — SODIUM CHLORIDE 0.9% FLUSH
10.0000 mL | INTRAVENOUS | Status: DC | PRN
  Administered 2022-08-02: 10 mL

## 2022-08-02 MED ORDER — DIPHENHYDRAMINE HCL 50 MG/ML IJ SOLN
50.0000 mg | Freq: Once | INTRAMUSCULAR | Status: AC
  Administered 2022-08-02: 50 mg via INTRAVENOUS
  Filled 2022-08-02: qty 1

## 2022-08-02 MED ORDER — ACETAMINOPHEN 325 MG PO TABS
650.0000 mg | ORAL_TABLET | Freq: Once | ORAL | Status: AC
  Administered 2022-08-02: 650 mg via ORAL
  Filled 2022-08-02: qty 2

## 2022-08-02 MED ORDER — HEPARIN SOD (PORK) LOCK FLUSH 100 UNIT/ML IV SOLN
500.0000 [IU] | Freq: Once | INTRAVENOUS | Status: AC | PRN
  Administered 2022-08-02: 500 [IU]

## 2022-08-02 MED ORDER — SODIUM CHLORIDE 0.9 % IV SOLN
150.0000 mg | Freq: Once | INTRAVENOUS | Status: AC
  Administered 2022-08-02: 150 mg via INTRAVENOUS
  Filled 2022-08-02: qty 150

## 2022-08-02 MED ORDER — HYDROCODONE-ACETAMINOPHEN 5-325 MG PO TABS: 1.0000 | ORAL_TABLET | ORAL | 0 refills | Status: DC | PRN

## 2022-08-02 NOTE — Progress Notes (Signed)
Palliative Medicine [facility]  Telephone:(336) [phone] Fax:(336) [phone]   Name: Lauren Salazar Date: 08/02/2022 MRN: [id number]  DOB: 01/15/1961  Patient Care Team: [facility] [employer], [facility] [place] as PCP - General (Family Medicine)   INTERVAL HISTORY: Lauren StagersJacqueline D Salazar is a 62 y.o. 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 Patient is also status post robotic assisted laparoscopic cystectomy and pelvic exoneration including hysterectomy, bilateral oophorectomy and ileoconduit diversion. Palliative ask to see for symptom and pain management and goals of care.   SOCIAL HISTORY:    [name] reports that she has been smoking cigarettes. She has been smoking an average of 1 pack per day. She has never used smokeless tobacco. She reports that she does not drink alcohol and does not use drugs.  ADVANCE DIRECTIVES:  None on file  CODE STATUS: Full code  PAST MEDICAL HISTORY: Past Medical History:  Diagnosis Date   Bladder tumor    Cancer    Complication of anesthesia    slow to wake after spinal fusion, then was "looney" for the following 24 hours    Persistent dry cough    ongoing since feb 2020    ALLERGIES:  has No Known Allergies.  MEDICATIONS:  Current Outpatient Medications  Medication Sig Dispense Refill   benzonatate (TESSALON) 100 MG capsule Take 2 capsules (200 mg total) by mouth 3 (three) times daily as needed for cough. 20 capsule 2   diphenoxylate-atropine ([name]) 2.5-0.025 MG tablet Take 1 tablet by mouth 4 (four) times daily as needed for diarrhea or loose stools. 30 tablet 0   folic acid (FOLVITE) 1 MG tablet TAKE 1 TABLET(1 MG) BY MOUTH DAILY 90 tablet 3   HYDROcodone-acetaminophen (NORCO) 5-325 MG tablet Take 1 tablet by mouth every 4 (four) hours as needed for moderate pain. 120 tablet 0   nicotine (NICODERM  CQ - DOSED IN MG/24 HOURS) 21 mg/24hr patch Place 21 mg onto the skin daily. (Patient not taking: Reported on 12/22/2020)     ondansetron (ZOFRAN) 8 MG tablet Take 1 tablet (8 mg total) by mouth every 8 (eight) hours as needed for nausea or vomiting. 20 tablet 1   prochlorperazine (COMPAZINE) 10 MG tablet Take 1 tablet (10 mg total) by mouth every 6 (six) hours as needed for nausea or vomiting. 30 tablet 2   No current facility-administered medications for this visit.   Facility-Administered Medications Ordered in Other Visits  Medication Dose Route Frequency Provider Last Rate Last Admin   DOCEtaxel (TAXOTERE) 96 mg in sodium chloride 0.9 % 250 mL chemo infusion  60 mg/m2 (Treatment Plan Recorded) Intravenous Once Jaci Standardorsey, John T IV, MD       fosaprepitant (EMEND) 150 mg in sodium chloride 0.9 % 145 mL IVPB  150 mg Intravenous Once Thayil, Irene T, PA-C 450 mL/hr at 08/02/22 1335 150 mg at 08/02/22 1335   heparin lock flush 100 unit/mL  500 Units Intracatheter Once PRN Jaci Standardorsey, John T IV, MD       ramucirumab [facility](CYRAMZA) 600 mg in sodium chloride 0.9 % 190 mL chemo infusion  10 mg/kg (Treatment Plan Recorded) Intravenous Once Ulysees Barnsorsey, John T IV, MD       sodium chloride flush (NS) 0.9 % injection 10 mL  10 mL Intracatheter PRN Jaci Standardorsey, John T IV, MD        VITAL SIGNS: There were no vitals  taken for this visit. There were no vitals filed for this visit.  Estimated body mass index is 19.47 kg/m as calculated from the following:   Height as of 07/25/22: 5\' 6"  (1.676 m).   Weight as of an earlier encounter on 08/02/22: 54.7 kg.   PERFORMANCE STATUS (ECOG) : 1 - Symptomatic but completely ambulatory  Physical Exam: General: NAD Cardiovascular: regular rate and rhythm Pulmonary: dyspnea with exertion, diminished lung sounds Abdomen: slightly rounded, tender, BS present x 4 quads Extremities: no edema, no joint deformities Skin: warm and dry Neurological: alert, oriented x 4, mood appropriate    IMPRESSION: [name] presents to this visit alone, following visit with oncology. She is visibly fatigued and noted to have dyspnea with conversation. Accessory muscle use noted when accompanying patient between exam room and waiting room. Reports pain as her chief concern and is also not resting well at night. States, "I used to know my body - but now it is all mixed up."   Neoplasm related pain [name] endorses continued, severe bone pain and frequent headaches. States that she hates how the Oxycodone makes her feel. Takes Tylenol and Ibuprofen to supplement use of the Oxycodone. Shares that she has a high tolerance for pain and a low tolerance of medications.  At Oncology visit, Oxycodone was changed to Norco. Education provided to patient that Norco contains both narcotic and Tylenol. She verbalizes understanding not to take OTC Tylenol with Norco - as this would be doubling the Tylenol.  Also support Oncology's recommendation for patient to take Claritin prior to chemotherapy treatments.   2.   Insomnia [name] endorses not resting well at night. Has tried Ibuprofen-PM and does not like the way that it makes her feel. States she feels "hung over" the next day.  Educated regarding Melatonin as a more natural alternative and patient open to trying this.   3.   Dyspnea/fatigue Endorses weakness and fatigue with any activity. Reports that it has become difficult to do even the most basic household activities. Reports that she became short of breath walking between the waiting room and exam room. This student accompanies patient back to waiting room from exam room at conclusion of visit and notes accessory muscle use with breathing.  Education was provided to patient regarding pacing of activities. Hopeful also that with Norco and Melatonin, she will rest better at night and have more energy during the daytime.   4.   Nausea/vomiting/diarrhea [name] reports that N/V  are currently controlled with use of Compazine. Appetite is improving and weight has increased from 53.4 kg (3/27) to 54.7 kg (today). [name] reports that her daughter noted on My Chart her increased blood glucose and has modified her diet to include less carbohydrates and fried foods. Patient states she is having a hard time getting used to the changes in her diet. Reassurances provided regarding the importance of calories while receiving treatments.  Reports that diarrhea is problematic during the first few days after chemotherapy treatment. Is prepared to use Imodium and drink Pedialyte/Sports drinks if and when this happens again. Patient given Pedialyte samples to take home.   Following previous chemotherapy treatment, came to cancer center to receive IVF for dehydration. [name] asks if she should call if she thinks she needs IVFs again. Discussed that yes - she should call as soon as she begins to feel that IVFs are needed. Verbalizes understanding that it is preferred for her to receive IVFs in the clinical  setting vs. an emergency department given decreased immunity.    We discussed [name]'s current illness and what it means in the larger context of her on-going co-morbidities. Natural disease trajectory and expectations were discussed. We discussed the importance of continued conversation with family and their medical providers regarding overall plan of care and treatment options, ensuring decisions are within the context of the patients values and GOCs.  PLAN:  Support discontinuation of Oxycodone and initiation of Norco, as per oncology. Education provided regarding not taking Tylenol with Norco, as Norco already contains Tylenol. Support Claritin prior to chemotherapy treatments, as per oncology. Ibuprofen as needed. Begin Melatonin 10 mg pill/gummy for every night as needed for insomnia.  Continue to pace activities and take naps/rest breaks as needed. Compazine 10 mg  every 6 hours as needed for nausea/vomiting. Continue with Imodium as needed for diarrhea.   Due to patient's significant dehydration and inability to hold down any nutrition or fluids following previous chemotherapy treatment - she is aware to call so that lab work and IVFs can be arranged as needed. We will continue to closely monitor and assist with symptom management as needed. Palliative will plan to see patient back in 3-4 weeks in collaboration to other oncology appointments.    Patient expressed understanding and was in agreement with this plan. She also understands that She can call the clinic at any time with any questions, concerns, or complaints.   Signed by: Katy Apo, RN MSN [facility] / NP Student  Any controlled substances utilized were prescribed in the context of palliative care. PDMP has been reviewed.   I assessed patient with Marylene Land, NP Student. Agree with above findings.   Visit consisted of counseling and education dealing with the complex and emotionally intense issues of symptom management and palliative care in the setting of serious and potentially life-threatening illness.Greater than 50%  of this time was spent counseling and coordinating care related to the above assessment and plan.  [name], AGPCNP-BC  Palliative Medicine Team/[facility]  *Please note that this is a verbal dictation therefore any spelling or grammatical errors are due to the "[employer]agon [employer] One" system interpretation.

## 2022-08-02 NOTE — Progress Notes (Signed)
[facility] [phone]   [phone]  PROGRESS NOTE  Patient Care Team: [facility] [place] as PCP - General (Family Medicine)  Hematological/Oncological History # Metastatic Adenosquamous Lung Cancer  05/10/2022: last visit with Dr. Clelia Croft 05/26/2022: CT scan showed enlargement of RLL mass, right iliac crest mass with large soft tissue components extending in the iliacus and gluteus musculature. Additionally new pathologic right external iliac adenopathy.  06/07/2022: transition care to Dr. Leonides Schanz  07/12/2022: Cycle 1 Day 1 of docetaxel and ramucirumab 08/02/2022: Cycle 2 Day 1 of docetaxel and ramucirumab  Interval History:  Lauren Salazar 62 y.o. female with medical history significant for metastatic adenosquamous lung cancer presents for a follow up visit. The patient's last visit was on 07/12/2022 when she started Cycle 1 of docetaxel and ramucirumab. She presents today for a follow up before Cycle 2, Day 1  Ms. Beshara reports she continues to have low energy which impacts her ADLs. She has stable appetite and is trying to supplement with snack to maintain her weight. She reports having nausea for approximately 4 days after the last treatment. Her symptoms did improve with her prescribed nausea medications. She reports diarrhea after treatment which has improved with imodium. She denies easy bruising or signs of bleeding. She reports right sided mid back pain that is unchanged and prevents her from having restful sleep. She is only taking tylenol since oxycodone was ineffective and caused drowsiness. She did experience diffuse joint pain after receiving Udenyca injection. She denies fevers, chills, sweats, shortness of breath, chest pain or cough. She has no other complaints. Rest of the 10 point ROS was otherwise negative.  She notes that she is willing and able to proceed with chemotherapy treatment today.  MEDICAL HISTORY:   Past Medical History:  Diagnosis Date   Bladder tumor    Cancer    Complication of anesthesia    slow to wake after spinal fusion, then was "looney" for the following 24 hours    Persistent dry cough    ongoing since feb 2020    SURGICAL HISTORY: Past Surgical History:  Procedure Laterality Date   CERVICAL FUSION  2005   c5-c6      CYSTOSCOPY WITH INJECTION N/A 02/01/2019   Procedure: CYSTOSCOPY WITH INJECTION;  Surgeon: Sebastian Ache, MD;  Location: [facility];  Service: Urology;  Laterality: N/A;   IR IMAGING GUIDED PORT INSERTION  07/25/2022   LYMPHADENECTOMY Bilateral 02/01/2019   Procedure: LYMPHADENECTOMY;  Surgeon: Sebastian Ache, MD;  Location: [facility];  Service: Urology;  Laterality: Bilateral;   ROBOT ASSISTED LAPAROSCOPIC COMPLETE CYSTECT ILEAL CONDUIT N/A 02/01/2019   Procedure: XI ROBOTIC ASSISTED LAPAROSCOPIC COMPLETE CYSTECTOMY, ILEAL CONDUIT, HYSTERECTOMY WITH BILATERAL SALPINGOOPHORECTOMY;  Surgeon: Sebastian Ache, MD;  Location: [facility];  Service: Urology;  Laterality: N/A;  6 HRS   TRANSURETHRAL RESECTION OF BLADDER TUMOR N/A 10/31/2018   Procedure: TRANSURETHRAL RESECTION OF BLADDER TUMOR (TURBT) WITH CYSTOSCOPY/ POSSIBLE INTRAVESICAL GEMCITABINE;  Surgeon: Rene Paci, MD;  Location: [facility];  Service: Urology;  Laterality: N/A;    SOCIAL HISTORY: Social History   Socioeconomic History   Marital status: Divorced    Spouse name: Not on file   Number of children: Not on file   Years of education: Not on file   Highest education level: Not on file  Occupational History   Not on file  Tobacco Use   Smoking status: Every Day    Packs/day: 1    Types: Cigarettes  Smokeless tobacco: Never   Tobacco comments:    smoking since age 69 , she is cutting back, using patches, trying to stop. 04/02/19  Vaping Use   Vaping Use: Never used  Substance and Sexual Activity   Alcohol use: No   Drug use: No   Sexual activity: Not Currently  Other Topics Concern    Not on file  Social History Narrative   Not on file   Social Determinants of Health   Financial Resource Strain: Not on file  Food Insecurity: Not on file  Transportation Needs: No Transportation Needs (04/02/2019)   PRAPARE - Administrator, Civil Service (Medical): No    Lack of Transportation (Non-Medical): No  Physical Activity: Not on file  Stress: Not on file  Social Connections: Not on file  Intimate Partner Violence: Not At Risk (04/02/2019)   Humiliation, Afraid, Rape, and Kick questionnaire    Fear of Current or Ex-Partner: No    Emotionally Abused: No    Physically Abused: No    Sexually Abused: No    FAMILY HISTORY: No family history on file.  ALLERGIES:  has No Known Allergies.  MEDICATIONS:  Current Outpatient Medications  Medication Sig Dispense Refill   benzonatate (TESSALON) 100 MG capsule Take 2 capsules (200 mg total) by mouth 3 (three) times daily as needed for cough. 20 capsule 2   diphenoxylate-atropine ([name]) 2.5-0.025 MG tablet Take 1 tablet by mouth 4 (four) times daily as needed for diarrhea or loose stools. 30 tablet 0   folic acid (FOLVITE) 1 MG tablet TAKE 1 TABLET(1 MG) BY MOUTH DAILY 90 tablet 3   HYDROcodone-acetaminophen (NORCO) 5-325 MG tablet Take 1 tablet by mouth every 4 (four) hours as needed for moderate pain. 120 tablet 0   ondansetron (ZOFRAN) 8 MG tablet Take 1 tablet (8 mg total) by mouth every 8 (eight) hours as needed for nausea or vomiting. 20 tablet 1   prochlorperazine (COMPAZINE) 10 MG tablet Take 1 tablet (10 mg total) by mouth every 6 (six) hours as needed for nausea or vomiting. 30 tablet 2   chlorpheniramine-HYDROcodone (TUSSIONEX) 10-8 MG/5ML SUER Take 5 mLs by mouth every 12 (twelve) hours as needed for cough. 140 mL 0   gabapentin (NEURONTIN) 300 MG capsule Take 1 capsule (300 mg total) by mouth 3 (three) times daily. Take for significant back pain around the clock. (Patient not taking: Reported on 12/22/2020)  42 capsule 1   guaiFENesin-dextromethorphan (ROBITUSSIN DM) 100-10 MG/5ML syrup Take 5-10 mLs by mouth every 4 (four) hours as needed for cough. (Patient not taking: Reported on 08/02/2022) 473 mL 0   nicotine (NICODERM CQ - DOSED IN MG/24 HOURS) 21 mg/24hr patch Place 21 mg onto the skin daily. (Patient not taking: Reported on 12/22/2020)     sucralfate (CARAFATE) 1 g tablet Dissolve 1 tablet in 10 mL H20 and swallow 30 min prior to meals and bedtime PRN heartburn. (Patient not taking: Reported on 08/02/2022) 40 tablet 2   No current facility-administered medications for this visit.   Facility-Administered Medications Ordered in Other Visits  Medication Dose Route Frequency Provider Last Rate Last Admin   dexamethasone (DECADRON) 10 mg in sodium chloride 0.9 % 50 mL IVPB  10 mg Intravenous Once Ulysees Barns IV, MD       DOCEtaxel (TAXOTERE) 96 mg in sodium chloride 0.9 % 250 mL chemo infusion  60 mg/m2 (Treatment Plan Recorded) Intravenous Once Jaci Standard, MD  fosaprepitant (EMEND) 150 mg in sodium chloride 0.9 % 145 mL IVPB  150 mg Intravenous Once Izzabell Klasen T, PA-C       heparin lock flush 100 unit/mL  500 Units Intracatheter Once PRN Jaci Standardorsey, John T IV, MD       ramucirumab [facility](CYRAMZA) 600 mg in sodium chloride 0.9 % 190 mL chemo infusion  10 mg/kg (Treatment Plan Recorded) Intravenous Once Jaci Standardorsey, John T IV, MD       sodium chloride flush (NS) 0.9 % injection 10 mL  10 mL Intracatheter PRN Jaci Standardorsey, John T IV, MD        REVIEW OF SYSTEMS:   Constitutional: ( - ) fevers, ( - )  chills , ( - ) night sweats Eyes: ( - ) blurriness of vision, ( - ) double vision, ( - ) watery eyes Ears, nose, mouth, throat, and face: ( - ) mucositis, ( - ) sore throat Respiratory: ( - ) cough, ( - ) dyspnea, ( - ) wheezes Cardiovascular: ( - ) palpitation, ( - ) chest discomfort, ( - ) lower extremity swelling Gastrointestinal:  ( + ) nausea, ( - ) heartburn, ( +) change in bowel habits Skin: ( - )  abnormal skin rashes Lymphatics: ( - ) new lymphadenopathy, ( - ) easy bruising Neurological: ( - ) numbness, ( - ) tingling, ( - ) new weaknesses Behavioral/Psych: ( - ) mood change, ( - ) new changes  All other systems were reviewed with the patient and are negative.  PHYSICAL EXAMINATION: ECOG PERFORMANCE STATUS: 1 - Symptomatic but completely ambulatory  Vitals:   08/02/22 1140  BP: (!) 150/95  Pulse: (!) 117  Resp: 17  Temp: (!) 97.3 F (36.3 C)  SpO2: 98%   Filed Weights   08/02/22 1140  Weight: 120 lb 9.6 oz (54.7 kg)    GENERAL: Chronically ill-appearing middle-aged Caucasian female, alert, no distress and comfortable SKIN: skin color, texture, turgor are normal, no rashes or significant lesions EYES: conjunctiva are pink and non-injected, sclera clear LUNGS: clear to auscultation and percussion with normal breathing effort HEART: regular rate & rhythm and no murmurs and no lower extremity edema Musculoskeletal: no cyanosis of digits and no clubbing  PSYCH: alert & oriented x 3, fluent speech NEURO: no focal motor/sensory deficits  LABORATORY DATA:  I have reviewed the data as listed    Latest Ref Rng & Units 08/02/2022   11:16 AM 07/20/2022    1:51 PM 07/12/2022   11:21 AM  CBC  WBC 4.0 - 10.5 K/uL 14.1  23.0  7.7   Hemoglobin 12.0 - 15.0 g/dL 16.112.1  09.612.0  04.512.2   Hematocrit 36.0 - 46.0 % 37.2  36.1  37.8   Platelets 150 - 400 K/uL 430  217  289        Latest Ref Rng & Units 08/02/2022   11:16 AM 07/20/2022    1:51 PM 07/12/2022   11:21 AM  CMP  Glucose 70 - 99 mg/dL 409120  811124  914131   BUN 8 - 23 mg/dL 15  21  19    Creatinine 0.44 - 1.00 mg/dL 7.820.78  9.560.78  2.130.74   Sodium 135 - 145 mmol/L 138  136  139   Potassium 3.5 - 5.1 mmol/L 3.9  4.1  4.2   Chloride 98 - 111 mmol/L 102  102  104   CO2 22 - 32 mmol/L 29  26  26    Calcium 8.9 - 10.3 mg/dL 9.1  8.8  9.0   Total Protein 6.5 - 8.1 g/dL 7.1  6.7  7.1   Total Bilirubin 0.3 - 1.2 mg/dL 0.3  0.2  0.3   Alkaline  Phos 38 - 126 U/L 102  142  84   AST 15 - 41 U/L 17  42  28   ALT 0 - 44 U/L 11  31  12      RADIOGRAPHIC STUDIES: IR IMAGING GUIDED PORT INSERTION  Result Date: 07/25/2022 CLINICAL DATA:  Lung carcinoma, needs durable venous access for planned treatment regimen EXAM: TUNNELED PORT CATHETER PLACEMENT WITH ULTRASOUND AND FLUOROSCOPIC GUIDANCE FLUOROSCOPY: Radiation Exposure Index (as provided by the fluoroscopic device): 1 mGy air Kerma ANESTHESIA/SEDATION: Intravenous Fentanyl and Versed 3mg  were administered as conscious sedation during continuous monitoring of the patient's level of consciousness and physiological / cardiorespiratory status by the radiology RN, with a total moderate sedation time of 20 minutes. TECHNIQUE: The procedure, risks, benefits, and alternatives were explained to the patient. Questions regarding the procedure were encouraged and answered. The patient understands and consents to the procedure. Patency of the right IJ vein was confirmed with ultrasound with image documentation. An appropriate skin site was determined. Skin site was marked. Region was prepped using maximum barrier technique including cap and mask, sterile gown, sterile gloves, large sterile sheet, and Chlorhexidine as cutaneous antisepsis. The region was infiltrated locally with 1% lidocaine. Under real-time ultrasound guidance, the right IJ vein was accessed with a 21 gauge micropuncture needle; the needle tip within the vein was confirmed with ultrasound image documentation. Needle was exchanged over a 018 guidewire for transitional dilator, and vascular measurement was performed. A small incision was made on the right anterior chest wall and a subcutaneous pocket fashioned. The power-injectable port was positioned and its catheter tunneled to the right IJ dermatotomy site. The transitional dilator was exchanged over an Amplatz wire for a peel-away sheath, through which the port catheter, which had been trimmed  to the appropriate length, was advanced and positioned under fluoroscopy with its tip at the cavoatrial junction. Spot chest radiograph confirms good catheter position and no pneumothorax. The port was flushed per protocol. The pocket was closed with deep interrupted and subcuticular continuous 3-0 Monocryl sutures. The incisions were covered with Dermabond then covered with a sterile dressing. The patient tolerated the procedure well. COMPLICATIONS: COMPLICATIONS None immediate IMPRESSION: Technically successful right IJ power-injectable port catheter placement. Ready for routine use. Electronically Signed   By: Corlis Leak M.D.   On: 07/25/2022 16:23    ASSESSMENT & PLAN Lauren Salazar 62 y.o. female with medical history significant for metastatic adenosquamous lung cancer presents for a follow up visit.  She is status post robotic assisted laparoscopic cystectomy and pelvic exoneration including hysterectomy, bilateral salpingo-oophorectomy and ileoconduit diversion completed on February 01, 2019.  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   Radiation therapy with weekly chemotherapy utilizing carboplatin and paclitaxel.  Week 1 of chemotherapy started on May 14, 2019.  Therapy concluded in February 2021.   Durvalumab 10 mg/kg cycle 1 on Sep 03, 2019 every 14 days.  She completed 4 cycles of therapy.   Carboplatin with Alimta and Pembrolizumab started on August 04, 2020.  She completed 3 cycles of therapy in June 2022.   Pembrolizumab 400 mg every 6 weeks started on December 22, 2020.  Therapy discontinued and March 2023 due to patient's preference.  Progression noted on CT scan from 05/26/2022.   Completed  palliative radiation to her R hip/pelvis on 07/01/2022. Received 30 Gy in 10 Fx.   Started Docetaxel and Ramucirumab on 07/12/2022.  # Metastatic Adenosquamous Carcinoma of the Lung  --patient has undergone numerous lines of treatment including  chemoradiation followed by immunotherapy, subsequent carbo/pem/pem and then monotherapy pembrolizumab --progression of disease noted on 05/26/2022 CT scan. --patient notes she does is considering stopping therapy but wants to try another round of chemotherapy.  --Started docetaxel plus ramucirumab on 07/12/2022 Plan: -- today is Cycle 2 Day 1 of Docetaxel/Ramucircumab -- labs today show white blood cell 14.1, hemoglobin 12.2, MCV 92.5, and platelets of 430. Creatinine and LFTs normal.  --Due to poor tolerance with Cycle 1, we will dose reduce Doxcetaxel from 75 mg/m2 to 60 mg/m2 starting today. Proceed with treatment as planned today.  --RTC in 3 weeks with labs, follow up before Cycle 3, Day 1 Docetaxel/Ramucircumab.  # Abdominal Pain/ Cancer Related Pain --Prescribed oxycodone 5 mg as needed, however patient avoids taking this as it causes her to be "loopy". --Patient completed palliative radiation with radiation oncology to right hip/pelvis with improvement of pain --Patient is c/o right sided mid back pain with minimal relief from tylenol and oxycodone. Recommend to discontinue oxycodone and sent Norco 5-325 mg q 4-6 hours as needed. Educated patient not to take Tylenol with Norco together.   #Nausea: --Added IV emend to her pre-meds --Continue to take zofran and compazine as needed.   #Diarrhea: --Secondary to chemotherapy --Continue to take imodium as needed.   #Joint pain 2/2 GCSF injection: --Recommend to take Claritin for several days with injection to minimize side effects.    No orders of the defined types were placed in this encounter.   All questions were answered. The patient knows to call the clinic with any problems, questions or concerns.  A total of more than 30 minutes were spent on this encounter with face-to-face time and non-face-to-face time, including preparing to see the patient, ordering tests and/or medications, counseling the patient and coordination of care  as outlined above.   Georga Kaufmann PA-C Dept of Hematology and Oncology [facility] at [facility] Phone: [phone]   08/02/2022 1:17 PM

## 2022-08-02 NOTE — Addendum Note (Signed)
Addended by: Georga Kaufmann T on: 08/02/2022 01:20 PM   Modules accepted: Orders

## 2022-08-02 NOTE — Progress Notes (Signed)
Per Georga Kaufmann, PA, OK to treat with pulse 110.

## 2022-08-02 NOTE — Patient Instructions (Signed)
[facility]  Discharge Instructions: Thank you for choosing [facility] to provide your oncology and hematology care.   If you have a lab appointment with the Cancer Center, please go directly to the Cancer Center and check in at the registration area.   Wear comfortable clothing and clothing appropriate for easy access to any Portacath or PICC line.   We strive to give you quality time with your provider. You may need to reschedule your appointment if you arrive late (15 or more minutes).  Arriving late affects you and other patients whose appointments are after yours.  Also, if you miss three or more appointments without notifying the office, you may be dismissed from the clinic at the provider's discretion.      For prescription refill requests, have your pharmacy contact our office and allow 72 hours for refills to be completed.    Today you received the following chemotherapy and/or immunotherapy agents: Ramucirumab, Docetaxel      To help prevent nausea and vomiting after your treatment, we encourage you to take your nausea medication as directed.  BELOW ARE SYMPTOMS THAT SHOULD BE REPORTED IMMEDIATELY: *FEVER GREATER THAN 100.4 F (38 C) OR HIGHER *CHILLS OR SWEATING *NAUSEA AND VOMITING THAT IS NOT CONTROLLED WITH YOUR NAUSEA MEDICATION *UNUSUAL SHORTNESS OF BREATH *UNUSUAL BRUISING OR BLEEDING *URINARY PROBLEMS (pain or burning when urinating, or frequent urination) *BOWEL PROBLEMS (unusual diarrhea, constipation, pain near the anus) TENDERNESS IN MOUTH AND THROAT WITH OR WITHOUT PRESENCE OF ULCERS (sore throat, sores in mouth, or a toothache) UNUSUAL RASH, SWELLING OR PAIN  UNUSUAL VAGINAL DISCHARGE OR ITCHING   Items with * indicate a potential emergency and should be followed up as soon as possible or go to the Emergency Department if any problems should occur.  Please show the CHEMOTHERAPY ALERT CARD or IMMUNOTHERAPY ALERT  CARD at check-in to the Emergency Department and triage nurse.  Should you have questions after your visit or need to cancel or reschedule your appointment, please contact [facility]  Dept: [phone]  and follow the prompts.  Office hours are 8:00 a.m. to 4:30 p.m. Monday - Friday. Please note that voicemails left after 4:00 p.m. may not be returned until the following business day.  We are closed weekends and major holidays. You have access to a nurse at all times for urgent questions. Please call the main number to the clinic Dept: [phone] and follow the prompts.   For any non-urgent questions, you may also contact your provider using MyChart. We now offer e-Visits for anyone 18 and older to request care online for non-urgent symptoms. For details visit [email or web address].   Also download the MyChart app! Go to the app store, search "MyChart", open the app, select [facility], and log in with your MyChart username and password.

## 2022-08-02 NOTE — Progress Notes (Signed)
Treatment plan dose Cyramza at 600 mg (10 mg/kg) with weight of 54.7 kg.  Within 10%  Pryor Ochoa, PharmD

## 2022-08-03 ENCOUNTER — Encounter: Payer: Self-pay | Admitting: Hematology and Oncology

## 2022-08-03 ENCOUNTER — Encounter: Payer: Self-pay | Admitting: Nurse Practitioner

## 2022-08-04 ENCOUNTER — Inpatient Hospital Stay: Payer: Medicaid Other

## 2022-08-04 ENCOUNTER — Ambulatory Visit
Admission: RE | Admit: 2022-08-04 | Discharge: 2022-08-04 | Disposition: A | Discharge status: Home / Self Care | Payer: Medicaid Other | Source: Ambulatory Visit | Attending: Radiation Oncology | Admitting: Radiation Oncology

## 2022-08-04 DIAGNOSIS — C7951 Secondary malignant neoplasm of bone: Secondary | ICD-10-CM

## 2022-08-04 NOTE — Progress Notes (Signed)
  Radiation Oncology         [phone] ________________________________  Name: Lauren Salazar MRN: [id number]  Date of Service: 08/04/2022  DOB: 05-Mar-1961  Post Treatment Telephone Note  The patient was available for call today.  The patient did note fatigue during radiation. The patient did not note skin changes in the field of radiation during therapy. The patient has noticed improvement in pain in the area(s) treated with radiation. The patient is not taking dexamethasone. The patient does have symptoms of  weakness of her lower extremities.   The patient is scheduled for ongoing care with medical oncology Dr. Leonides Schanz, and has an appointment with Georga Kaufmann, PA-C at the end of the month before her next infusion treatment. The patient was encouraged to call if shedevelops concerns or questions regarding radiation.

## 2022-08-05 ENCOUNTER — Ambulatory Visit: Payer: Medicaid Other | Admitting: Radiation Oncology

## 2022-08-08 ENCOUNTER — Telehealth: Payer: Self-pay

## 2022-08-08 ENCOUNTER — Telehealth: Payer: Self-pay | Admitting: Hematology and Oncology

## 2022-08-08 NOTE — Telephone Encounter (Signed)
Reached out to patient to schedule per missed injection, left voicemail,

## 2022-08-08 NOTE — Telephone Encounter (Signed)
Rn received return phone call from pt after voicemail was left earlier. Rn explained to pt that Dr. Basilio Cairo had spoken with Dr. Leonides Schanz, and they both felt letting the systemic treatment work first would be be best course of action. She understood this update and had no questions. She did state her pain was already better from what it was after the systemic treatment had started. RN encouraged pt to reach out with any questions or concerns. Pt was grateful for the call.

## 2022-08-08 NOTE — Telephone Encounter (Signed)
Rn attempted to call pt at Dr. Colletta [place] request but got no answer. Rn did leave voicemail for pt and Rn will call pt back later in the day.

## 2022-08-09 ENCOUNTER — Telehealth: Payer: Self-pay | Admitting: Physician Assistant

## 2022-08-09 ENCOUNTER — Telehealth: Payer: Self-pay

## 2022-08-09 ENCOUNTER — Telehealth: Payer: Self-pay | Admitting: *Deleted

## 2022-08-09 NOTE — Telephone Encounter (Signed)
RN Victorino Dike called pt to get more information on message that was sent for concern for pt hip pain. She stated she feels like right hip is locked up and cannot bear weight on it. She states she currently in bed and not able to walk to the right hip pain. She reports shooting pain in right leg when she tries to bear weight on it. She states this similar incident happened before with treatment and she had to walk with a walker for a while (after completing resting it). She also has an audible cough but denies shortness of breath at this time. She did tell RN she took and oxy for pain and is now resting in bed. Rn advised pt to go the ED to be evaluated for this pain so images could be taken. She said her son could take her this after noon and she did not want to call 911 now. Rn did encourage pt to call 911 if needed before her son arrives.

## 2022-08-09 NOTE — Telephone Encounter (Signed)
Reached out to patient to schedule per WQ no answer.

## 2022-08-09 NOTE — Telephone Encounter (Signed)
Lauren Salazar states her "right hip /lower back has frozen. I am basically bedridden. I can sit and stand, but cannot put any pressure on the hip. I cannot walk. It did this yesterday, but it is worse today. I don't know if it is the tumor or if it is from the radiation or chemo.  I am not sure what to do"

## 2022-08-10 ENCOUNTER — Telehealth: Payer: Self-pay

## 2022-08-10 ENCOUNTER — Telehealth: Payer: Self-pay | Admitting: Hematology and Oncology

## 2022-08-10 NOTE — Telephone Encounter (Signed)
Reached out to patient to make her aware of her appointment on Friday, left voicemail.

## 2022-08-10 NOTE — Telephone Encounter (Signed)
Rn called to check on pt after telephone call yesterday (concerning hip and back pain). Rn encouraged pt to go ED yesterday due to her symptoms. On phone call today she stated she did did not go the ED yesterday due to her son having a meeting after work. She did report improvement in symptoms today (she was able to walk to the kitchen with much less pain). She continues to medicate with pain meds and use heating pad to help with pain. She states she feels it is coming from the chemo and how her body is "mutating" the cancer. She also reports that she feels different in the chest area as well (at tumor area). Overall she reports improvement and feels no medical attention is needed at his time. Rn did still encourage pt to reach out for any needs related to this concern. She agreed and stated she would reach out if needed.

## 2022-08-11 ENCOUNTER — Other Ambulatory Visit: Payer: Self-pay

## 2022-08-11 MED ORDER — FOLIC ACID 1 MG PO TABS: ORAL_TABLET | ORAL | 3 refills | Status: DC

## 2022-08-12 ENCOUNTER — Ambulatory Visit: Payer: Medicaid Other

## 2022-08-22 ENCOUNTER — Telehealth: Payer: Self-pay | Admitting: *Deleted

## 2022-08-22 ENCOUNTER — Telehealth: Payer: Self-pay | Admitting: Physician Assistant

## 2022-08-22 ENCOUNTER — Other Ambulatory Visit: Payer: Self-pay | Admitting: Hematology

## 2022-08-22 DIAGNOSIS — C3431 Malignant neoplasm of lower lobe, right bronchus or lung: Secondary | ICD-10-CM

## 2022-08-22 MED FILL — Dexamethasone Sodium Phosphate Inj 100 MG/10ML: INTRAMUSCULAR | Qty: 1 | Status: AC

## 2022-08-22 NOTE — Telephone Encounter (Signed)
TCT patient regarding her appts for tomorrow, 08/23/22. She had called to cancel, saying she had a death in the family. Spoke with pt. She states it really isn't a death in the family, though a friend did pass away recently. 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  She has Norco for pain but only uses it at night so she can sleep. She does not want to take during the day because it makes her sleepy. She is using plain tylenol for pain without relief. She states she thinks she has a pressure sore on her buttocks because she is in bed all the time. She states she tries to walk with a walker but cannot get very far. She uses a bedside commode. She states she is weak, having diarrhea 2-3 x a day. She is using lomotil without much relief. Advised that she should be seen to evaluate her overall status with labs, xray of hip etc.  Pt reluctant to go to ED. Advised that we can't help as much over the phone. Discussed with Georga Kaufmann, PA Georga Kaufmann, PA agrees that pt needs to be seen and evaluated either in [facility] or the ED.  TCT patient and presented both options to her. Pt states she will get a ride here tomorrow for port flush with labs and  then be seen in [facility]. Advised that she needs to get a ride and not attempt to drive herself. Pt voiced understanding  Daphane Shepherd, PA made aware.

## 2022-08-22 NOTE — Telephone Encounter (Signed)
patient left voicemail to cancel appointments due to a death in the family, tried to reach back to patient left voicemail.

## 2022-08-23 ENCOUNTER — Inpatient Hospital Stay: Payer: Medicaid Other

## 2022-08-23 ENCOUNTER — Inpatient Hospital Stay: Payer: Medicaid Other | Admitting: Nurse Practitioner

## 2022-08-23 ENCOUNTER — Inpatient Hospital Stay: Payer: Medicaid Other | Admitting: Physician Assistant

## 2022-08-23 ENCOUNTER — Inpatient Hospital Stay (HOSPITAL_BASED_OUTPATIENT_CLINIC_OR_DEPARTMENT_OTHER): Payer: Medicaid Other | Admitting: Nurse Practitioner

## 2022-08-23 ENCOUNTER — Encounter: Payer: Self-pay | Admitting: Nurse Practitioner

## 2022-08-23 ENCOUNTER — Telehealth: Payer: Self-pay | Admitting: Hematology and Oncology

## 2022-08-23 ENCOUNTER — Inpatient Hospital Stay (HOSPITAL_BASED_OUTPATIENT_CLINIC_OR_DEPARTMENT_OTHER): Payer: Medicaid Other | Admitting: Physician Assistant

## 2022-08-23 ENCOUNTER — Other Ambulatory Visit: Payer: Self-pay | Admitting: Physician Assistant

## 2022-08-23 VITALS — BP 156/89 | HR 86 | Resp 18

## 2022-08-23 VITALS — BP 129/92 | HR 106 | Temp 97.5°F | Resp 20

## 2022-08-23 DIAGNOSIS — Z515 Encounter for palliative care: Secondary | ICD-10-CM

## 2022-08-23 DIAGNOSIS — C3490 Malignant neoplasm of unspecified part of unspecified bronchus or lung: Secondary | ICD-10-CM

## 2022-08-23 DIAGNOSIS — C3431 Malignant neoplasm of lower lobe, right bronchus or lung: Secondary | ICD-10-CM

## 2022-08-23 DIAGNOSIS — G893 Neoplasm related pain (acute) (chronic): Secondary | ICD-10-CM | POA: Diagnosis not present

## 2022-08-23 DIAGNOSIS — L89309 Pressure ulcer of unspecified buttock, unspecified stage: Secondary | ICD-10-CM

## 2022-08-23 DIAGNOSIS — R530 Neoplastic (malignant) related fatigue: Secondary | ICD-10-CM | POA: Diagnosis not present

## 2022-08-23 DIAGNOSIS — E860 Dehydration: Secondary | ICD-10-CM

## 2022-08-23 DIAGNOSIS — R197 Diarrhea, unspecified: Secondary | ICD-10-CM | POA: Diagnosis not present

## 2022-08-23 DIAGNOSIS — Z5111 Encounter for antineoplastic chemotherapy: Secondary | ICD-10-CM | POA: Diagnosis not present

## 2022-08-23 DIAGNOSIS — R110 Nausea: Secondary | ICD-10-CM

## 2022-08-23 DIAGNOSIS — B370 Candidal stomatitis: Secondary | ICD-10-CM

## 2022-08-23 DIAGNOSIS — R630 Anorexia: Secondary | ICD-10-CM

## 2022-08-23 LAB — CMP (CANCER CENTER ONLY)
ALT: 7 U/L (ref 0–44)
AST: 15 U/L (ref 15–41)
Albumin: 3.4 g/dL — ABNORMAL LOW (ref 3.5–5.0)
Alkaline Phosphatase: 86 U/L (ref 38–126)
Anion gap: 6 (ref 5–15)
BUN: 14 mg/dL (ref 8–23)
CO2: 28 mmol/L (ref 22–32)
Calcium: 9.2 mg/dL (ref 8.9–10.3)
Chloride: 102 mmol/L (ref 98–111)
Creatinine: 0.79 mg/dL (ref 0.44–1.00)
GFR, Estimated: >60 mL/min (ref >60)
Glucose, Bld: 112 mg/dL — ABNORMAL HIGH (ref 70–99)
Potassium: 4.2 mmol/L (ref 3.5–5.1)
Sodium: 136 mmol/L (ref 135–145)
Total Bilirubin: 0.2 mg/dL — ABNORMAL LOW (ref 0.3–1.2)
Total Protein: 6.9 g/dL (ref 6.5–8.1)

## 2022-08-23 LAB — CBC WITH DIFFERENTIAL (CANCER CENTER ONLY)
Abs Immature Granulocytes: 0.04 10*3/uL (ref 0.00–0.07)
Basophils Absolute: 0.1 10*3/uL (ref 0.0–0.1)
Basophils Relative: 1 %
Eosinophils Absolute: 0 10*3/uL (ref 0.0–0.5)
Eosinophils Relative: 0 %
HCT: 37.6 % (ref 36.0–46.0)
Hemoglobin: 12.3 g/dL (ref 12.0–15.0)
Immature Granulocytes: 0 %
Lymphocytes Relative: 6 %
Lymphs Abs: 0.6 10*3/uL — ABNORMAL LOW (ref 0.7–4.0)
MCH: 30.2 pg (ref 26.0–34.0)
MCHC: 32.7 g/dL (ref 30.0–36.0)
MCV: 92.4 fL (ref 80.0–100.0)
Monocytes Absolute: 0.9 10*3/uL (ref 0.1–1.0)
Monocytes Relative: 9 %
Neutro Abs: 8.3 10*3/uL — ABNORMAL HIGH (ref 1.7–7.7)
Neutrophils Relative %: 84 %
Platelet Count: 309 10*3/uL (ref 150–400)
RBC: 4.07 MIL/uL (ref 3.87–5.11)
RDW: 19 % — ABNORMAL HIGH (ref 11.5–15.5)
WBC Count: 9.8 10*3/uL (ref 4.0–10.5)
nRBC: 0 % (ref 0.0–0.2)

## 2022-08-23 LAB — MAGNESIUM: Magnesium: 1.9 mg/dL (ref 1.7–2.4)

## 2022-08-23 LAB — TSH: TSH: 6.327 u[IU]/mL — ABNORMAL HIGH (ref 0.350–4.500)

## 2022-08-23 LAB — TOTAL PROTEIN, URINE DIPSTICK: Protein, ur: NEGATIVE mg/dL

## 2022-08-23 MED ORDER — DIPHENHYDRAMINE HCL 50 MG/ML IJ SOLN
50.0000 mg | Freq: Once | INTRAMUSCULAR | Status: AC
  Administered 2022-08-23: 50 mg via INTRAVENOUS
  Filled 2022-08-23: qty 1

## 2022-08-23 MED ORDER — AMOXICILLIN-POT CLAVULANATE 875-125 MG PO TABS: 1.0000 | ORAL_TABLET | Freq: Two times a day (BID) | ORAL | 0 refills | Status: AC

## 2022-08-23 MED ORDER — SODIUM CHLORIDE 0.9% FLUSH
10.0000 mL | INTRAVENOUS | Status: AC | PRN
  Administered 2022-08-23: 10 mL

## 2022-08-23 MED ORDER — KETOROLAC TROMETHAMINE 30 MG/ML IJ SOLN
30.0000 mg | Freq: Once | INTRAMUSCULAR | Status: AC
  Administered 2022-08-23: 30 mg via INTRAVENOUS
  Filled 2022-08-23: qty 1

## 2022-08-23 MED ORDER — SODIUM CHLORIDE 0.9 % IV SOLN
10.0000 mg | Freq: Once | INTRAVENOUS | Status: DC
  Filled 2022-08-23: qty 1

## 2022-08-23 MED ORDER — HEPARIN SOD (PORK) LOCK FLUSH 100 UNIT/ML IV SOLN: 500.0000 [IU] | INTRAVENOUS | Status: DC | PRN

## 2022-08-23 MED ORDER — SODIUM CHLORIDE 0.9 % IV SOLN
10.0000 mg | Freq: Once | INTRAVENOUS | Status: AC
  Administered 2022-08-23: 10 mg via INTRAVENOUS
  Filled 2022-08-23: qty 10

## 2022-08-23 MED ORDER — SODIUM CHLORIDE 0.9 % IV SOLN: Freq: Once | INTRAVENOUS | Status: AC

## 2022-08-23 MED ORDER — SODIUM CHLORIDE 0.9 % IV SOLN: INTRAVENOUS | Status: DC

## 2022-08-23 MED ORDER — SODIUM CHLORIDE 0.9% FLUSH
10.0000 mL | INTRAVENOUS | Status: DC | PRN
  Administered 2022-08-23: 10 mL

## 2022-08-23 MED ORDER — SODIUM CHLORIDE 0.9 % IV SOLN
10.0000 mg/kg | Freq: Once | INTRAVENOUS | Status: AC
  Administered 2022-08-23: 600 mg via INTRAVENOUS
  Filled 2022-08-23: qty 10

## 2022-08-23 MED ORDER — HEPARIN SOD (PORK) LOCK FLUSH 100 UNIT/ML IV SOLN
500.0000 [IU] | Freq: Once | INTRAVENOUS | Status: AC | PRN
  Administered 2022-08-23: 500 [IU]

## 2022-08-23 MED ORDER — SODIUM CHLORIDE 0.9 % IV SOLN
150.0000 mg | Freq: Once | INTRAVENOUS | Status: AC
  Administered 2022-08-23: 150 mg via INTRAVENOUS
  Filled 2022-08-23: qty 150

## 2022-08-23 MED ORDER — SULFAMETHOXAZOLE-TRIMETHOPRIM 800-160 MG PO TABS: 1.0000 | ORAL_TABLET | Freq: Two times a day (BID) | ORAL | 0 refills | Status: AC

## 2022-08-23 MED ORDER — SODIUM CHLORIDE 0.9 % IV SOLN
60.0000 mg/m2 | Freq: Once | INTRAVENOUS | Status: AC
  Administered 2022-08-23: 96 mg via INTRAVENOUS
  Filled 2022-08-23: qty 9.6

## 2022-08-23 MED ORDER — ACETAMINOPHEN 325 MG PO TABS
650.0000 mg | ORAL_TABLET | Freq: Once | ORAL | Status: AC
  Administered 2022-08-23: 650 mg via ORAL
  Filled 2022-08-23: qty 2

## 2022-08-23 NOTE — Telephone Encounter (Signed)
reached out to patient to schedule per 4/301 IB, left voicemail.

## 2022-08-23 NOTE — Progress Notes (Signed)
Palliative Medicine [facility]  [phone] Fax:([phone]) [phone]   Name: Lauren Salazar Date: 08/23/2022 MRN: [id number]  DOB: 12-24-1960  Patient Care Team: [facility] [place] as PCP - General (Family Medicine)   INTERVAL HISTORY: Lauren Salazar is a 62 y.o. 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 Patient is also status post robotic assisted laparoscopic cystectomy and pelvic exoneration including hysterectomy, bilateral oophorectomy and ileoconduit diversion. Palliative ask to see for symptom and pain management and goals of care.   SOCIAL HISTORY:    Ms. Weisheit reports that she has been smoking cigarettes. She has been smoking an average of 1 pack per day. She has never used smokeless tobacco. She reports that she does not drink alcohol and does not use drugs.  ADVANCE DIRECTIVES:  None on file  CODE STATUS: Full code  PAST MEDICAL HISTORY: Past Medical History:  Diagnosis Date   Bladder tumor    Cancer (HCC)    Complication of anesthesia    slow to wake after spinal fusion, then was "looney" for the following 24 hours    Persistent dry cough    ongoing since feb 2020    ALLERGIES:  has No Known Allergies.  MEDICATIONS:  Current Outpatient Medications  Medication Sig Dispense Refill   amoxicillin-clavulanate (AUGMENTIN) 875-125 MG tablet Take 1 tablet by mouth 2 (two) times daily for 7 days. 14 tablet 0   benzonatate (TESSALON) 100 MG capsule Take 2 capsules (200 mg total) by mouth 3 (three) times daily as needed for cough. 20 capsule 2   diphenoxylate-atropine ([name]) 2.5-0.025 MG tablet Take 1 tablet by mouth 4 (four) times daily as needed for diarrhea or loose stools. 30 tablet 0   folic acid (FOLVITE) 1 MG tablet TAKE 1 TABLET(1 MG) BY MOUTH DAILY 90 tablet 3   HYDROcodone-acetaminophen  (NORCO) 5-325 MG tablet Take 1 tablet by mouth every 4 (four) hours as needed for moderate pain. 120 tablet 0   nicotine (NICODERM CQ - DOSED IN MG/24 HOURS) 21 mg/24hr patch Place 21 mg onto the skin daily. (Patient not taking: Reported on 12/22/2020)     ondansetron (ZOFRAN) 8 MG tablet Take 1 tablet (8 mg total) by mouth every 8 (eight) hours as needed for nausea or vomiting. 20 tablet 1   prochlorperazine (COMPAZINE) 10 MG tablet Take 1 tablet (10 mg total) by mouth every 6 (six) hours as needed for nausea or vomiting. 30 tablet 2   sulfamethoxazole-trimethoprim (BACTRIM DS) 800-160 MG tablet Take 1 tablet by mouth 2 (two) times daily for 7 days. 14 tablet 0   No current facility-administered medications for this visit.    VITAL SIGNS: There were no vitals taken for this visit. There were no vitals filed for this visit.  Estimated body mass index is 19.47 kg/m as calculated from the following:   Height as of 07/25/22: 5\' 6"  (1.676 m).   Weight as of 08/02/22: 54.7 kg.   PERFORMANCE STATUS (ECOG) : 1 - Symptomatic but completely ambulatory  Physical Exam: General: NAD Cardiovascular: regular rate and rhythm Pulmonary: regular, even, and unlabored Extremities: no edema, no joint deformities Skin: Stage II to gluteal cleft Neurological: alert, oriented x 4, mood appropriate   IMPRESSION: Ms. Kubicek is seen in the infusion area. She is alone for this visit, alert, oriented, and engaging.  Neoplasm related pain Ms. Sinclair Grooms  endorses continued bone pain. She reports now having a pressure ulcer to her bottom that complicates her pain. She uses a down feather pillow to cushion and shift her weight at home. Finds it most comfortable to lie or sit leaning over onto her left side. Encouraged to continue off-loading pressure with pillows or obtaining a donut cushion. Wound care is provided and patient given foam dressings to continue care at home.  Discussed current medication regimen and will  add Mobic. Patient agreeable to and verbalizes understanding of this.   2.   Stomatitis Ms. Temkin reports that the inside of her mouth gets red and irritated following chemo treatments. Reports that her nose is also sensitive following treatments.  Will send in prescription for Magic Mouthwash. Patient educated on its use.    4.   Decreased appetite Ms. Bake reports that nausea, vomiting, and diarrhea have all improved. She continues to struggle with a decreased appetite. States that most foods and even water taste "metallic."   She has found that Yoohoo drinks taste good to her. Commended for working to find foods/drinks that are most palatable. Patient finds that protein supplement drinks do not taste good. Encouraged to try mixing them with the Yoohoo or even ice cream for a milk-shake type drink.    We discussed Ms. Pfefferkorn's current illness and what it means in the larger context of her on-going co-morbidities. Natural disease trajectory and expectations were discussed. We discussed the importance of continued conversation with family and their medical providers regarding overall plan of care and treatment options, ensuring decisions are within the context of the patients values and GOCs.   PLAN: Wound care done for new Stage II to gluteal cleft. Patient educated on wound care and encouraged to off-load area. Given foam dressings for use at home. Begin Mobic daily. Begin Magic Mouthwash as needed for chemo-induced stomatitis. Due to patient's significant dehydration and inability to hold down any nutrition or fluids following past chemotherapy treatments - she is aware to call so that lab work and IVFs can be arranged as needed. We will continue to closely monitor and assist with symptom management as needed. Palliative will plan to see patient back in 3-4 weeks in collaboration to other oncology appointments.    Patient expressed understanding and was in agreement with this  plan. She also understands that She can call the clinic at any time with any questions, concerns, or complaints.   Any controlled substances utilized were prescribed in the context of palliative care. PDMP has been reviewed.   Visit consisted of counseling and education dealing with the complex and emotionally intense issues of symptom management and palliative care in the setting of serious and potentially life-threatening illness.Greater than 50%  of this time was spent counseling and coordinating care related to the above assessment and plan.  Signed by: Katy Apo, RN MSN [facility] / NP Student   [name], AGPCNP-BC  Palliative Medicine Team/[facility]  *Please note that this is a verbal dictation therefore any spelling or grammatical errors are due to the "Dragon Medical One" system interpretation.

## 2022-08-23 NOTE — Progress Notes (Signed)
Symptom Management Consult Note [facility]    Patient Care Team: [facility] [place] as PCP - General (Family Medicine)    Name / MRN / DOB: Lauren Salazar  [id number]  July 07, 1960   Date of visit: 08/23/2022   Chief Complaint/Reason for visit: wound, diarrhea   Current Therapy: docetaxel and ramucirumab with udyenca   Last treatment:  Day 1   Cycle 2 on 08/02/22   ASSESSMENT & PLAN: Patient is a 62 y.o. female  with oncologic history of  metastatic adenosquamous lung cancer followed by Dr. Leonides Schanz.  I have viewed most recent oncology note and lab work.    #Metastatic adenosquamous lung cancer  -Here for treatment today. She had cancelled treatment however was feeling better and eager to have treatment today. Labs are within parameters for treatment.   #Pressure ulcer -Patient afebrile, hemodynamically stable. -On exam she has stage II pressure ulcer on gluteal cleft.  Will send referral for wound care and prescribed antibiotic therapy as she is high risk of being immunocompromise from treatment. -Patient given dressing here and discussed importance of keep pressure off her backside and rotating at least every 2 hours if staying in bed.   #Diarrhea -Resolved prior to clinic arrival.  Able to manage with over-the-counter Imodium. -CMP without significant electrolyte derangement. - Patient given 1L NS in clinic for hydration support.   Heme/Onc History: Oncology History  Lung cancer (HCC)  03/14/2019 Initial Diagnosis   Lung cancer (HCC)   05/14/2019 - 06/04/2019 Chemotherapy   The patient had palonosetron (ALOXI) injection 0.25 mg, 0.25 mg, Intravenous,  Once, 4 of 4 cycles Administration: 0.25 mg (05/14/2019), 0.25 mg (05/21/2019), 0.25 mg (05/28/2019), 0.25 mg (06/04/2019) CARBOplatin (PARAPLATIN) 190 mg in sodium chloride 0.9 % 250 mL chemo infusion, 190 mg (102.4 % of original dose 186 mg), Intravenous,  Once, 4 of 4 cycles Dose  modification:   (original dose 186 mg, Cycle 1) Administration: 190 mg (05/14/2019), 190 mg (05/21/2019), 200 mg (05/28/2019), 200 mg (06/04/2019) PACLitaxel (TAXOL) 78 mg in sodium chloride 0.9 % 250 mL chemo infusion (</= 80mg /m2), 45 mg/m2 = 78 mg, Intravenous,  Once, 4 of 4 cycles Administration: 78 mg (05/14/2019), 78 mg (05/21/2019), 78 mg (05/28/2019), 78 mg (06/04/2019)  for chemotherapy treatment.    09/03/2019 - 10/15/2019 Chemotherapy    Patient is on Treatment Plan: LUNG CARBOPLATIN / PEMETREXED / PEMBROLIZUMAB Q21D INDUCTION X 4 CYCLES / MAINTENANCE PEMETREXED + PEMBROLIZUMAB      07/22/2020 Cancer Staging   Staging form: Lung, AJCC 8th Edition - Clinical: Stage IIIB (cT4, cN2, cM0) - Signed by Benjiman Core, MD on 07/22/2020   08/04/2020 - 07/07/2021 Chemotherapy   Patient is on Treatment Plan : LUNG CARBOplatin / Pemetrexed / Pembrolizumab q21d Induction x 4 cycles / Maintenance Pemetrexed + Pembrolizumab     12/22/2020 - 03/28/2022 Chemotherapy   Patient is on Treatment Plan : ANUS Pembrolizumab (400) q42d     07/12/2022 -  Chemotherapy   Patient is on Treatment Plan : LUNG Docetaxel + Ramucirumab q21d      Cancer of lower lobe of right lung (HCC)  04/02/2019 Initial Diagnosis   Cancer of lower lobe of right lung (HCC)   04/02/2019 Cancer Staging   Staging form: Lung, AJCC 8th Edition - Clinical stage from 04/02/2019: Stage IIIB (cT4, cN2, cM0) - Signed by Lonie Peak, MD on 04/02/2019       Interval history-: Lauren Salazar is a  62 y.o. female with oncologic history as above presenting to [facility] today with chief complaint of wound and diarrhea.  She presents unaccompanied to clinic today.  Patient states she has had a bedsore for the last x 1 week.  She has been applying Neosporin to it.  She has not had any fever or chills.  She has noticed some purulent drainage on her close.  She thinks the bedsore came as a result of laying in bed for several days because she was feeling  poorly after her last treatment.  Patient is also endorsing diarrhea for the last week.  It has since resolved x 3 days ago.  She is now having 1 bowel movement daily.  She states the diarrhea improved after she started taking Imodium consistently.  She denies any associated abdominal pain, nausea or vomiting.  She has had decreased p.o. intake because of changes in her taste, no food tastes appealing.  Patient states she canceled her treatment today because she had been feeling poorly however she is now feeling improved and asking if she can receive treatment today.      ROS  All other systems are reviewed and are negative for acute change except as noted in the HPI.    No Known Allergies   Past Medical History:  Diagnosis Date   Bladder tumor    Cancer (HCC)    Complication of anesthesia    slow to wake after spinal fusion, then was "looney" for the following 24 hours    Persistent dry cough    ongoing since feb 2020     Past Surgical History:  Procedure Laterality Date   CERVICAL FUSION  2005   c5-c6      CYSTOSCOPY WITH INJECTION N/A 02/01/2019   Procedure: CYSTOSCOPY WITH INJECTION;  Surgeon: Sebastian Ache, MD;  Location: [facility];  Service: Urology;  Laterality: N/A;   IR IMAGING GUIDED PORT INSERTION  07/25/2022   LYMPHADENECTOMY Bilateral 02/01/2019   Procedure: LYMPHADENECTOMY;  Surgeon: Sebastian Ache, MD;  Location: [facility];  Service: Urology;  Laterality: Bilateral;   ROBOT ASSISTED LAPAROSCOPIC COMPLETE CYSTECT ILEAL CONDUIT N/A 02/01/2019   Procedure: XI ROBOTIC ASSISTED LAPAROSCOPIC COMPLETE CYSTECTOMY, ILEAL CONDUIT, HYSTERECTOMY WITH BILATERAL SALPINGOOPHORECTOMY;  Surgeon: Sebastian Ache, MD;  Location: [facility];  Service: Urology;  Laterality: N/A;  6 HRS   TRANSURETHRAL RESECTION OF BLADDER TUMOR N/A 10/31/2018   Procedure: TRANSURETHRAL RESECTION OF BLADDER TUMOR (TURBT) WITH CYSTOSCOPY/ POSSIBLE INTRAVESICAL GEMCITABINE;  Surgeon: Rene Paci, MD;   Location: [facility];  Service: Urology;  Laterality: N/A;    Social History   Socioeconomic History   Marital status: Divorced    Spouse name: Not on file   Number of children: Not on file   Years of education: Not on file   Highest education level: Not on file  Occupational History   Not on file  Tobacco Use   Smoking status: Every Day    Packs/day: 1    Types: Cigarettes   Smokeless tobacco: Never   Tobacco comments:    smoking since age 71 , she is cutting back, using patches, trying to stop. 04/02/19  Vaping Use   Vaping Use: Never used  Substance and Sexual Activity   Alcohol use: No   Drug use: No   Sexual activity: Not Currently  Other Topics Concern   Not on file  Social History Narrative   Not on file   Social Determinants of Health   Financial Resource Strain: Not on  file  Food Insecurity: Not on file  Transportation Needs: No Transportation Needs (04/02/2019)   PRAPARE - Administrator, Civil Service (Medical): No    Lack of Transportation (Non-Medical): No  Physical Activity: Not on file  Stress: Not on file  Social Connections: Not on file  Intimate Partner Violence: Not At Risk (04/02/2019)   Humiliation, Afraid, Rape, and Kick questionnaire    Fear of Current or Ex-Partner: No    Emotionally Abused: No    Physically Abused: No    Sexually Abused: No    No family history on file.   Current Outpatient Medications:    amoxicillin-clavulanate (AUGMENTIN) 875-125 MG tablet, Take 1 tablet by mouth 2 (two) times daily for 7 days., Disp: 14 tablet, Rfl: 0   sulfamethoxazole-trimethoprim (BACTRIM DS) 800-160 MG tablet, Take 1 tablet by mouth 2 (two) times daily for 7 days., Disp: 14 tablet, Rfl: 0   benzonatate (TESSALON) 100 MG capsule, Take 2 capsules (200 mg total) by mouth 3 (three) times daily as needed for cough., Disp: 20 capsule, Rfl: 2   diphenoxylate-atropine ([name]) 2.5-0.025 MG tablet, Take 1 tablet by mouth 4 (four) times daily as  needed for diarrhea or loose stools., Disp: 30 tablet, Rfl: 0   folic acid (FOLVITE) 1 MG tablet, TAKE 1 TABLET(1 MG) BY MOUTH DAILY, Disp: 90 tablet, Rfl: 3   HYDROcodone-acetaminophen (NORCO) 5-325 MG tablet, Take 1 tablet by mouth every 4 (four) hours as needed for moderate pain., Disp: 120 tablet, Rfl: 0   nicotine (NICODERM CQ - DOSED IN MG/24 HOURS) 21 mg/24hr patch, Place 21 mg onto the skin daily. (Patient not taking: Reported on 12/22/2020), Disp: , Rfl:    ondansetron (ZOFRAN) 8 MG tablet, Take 1 tablet (8 mg total) by mouth every 8 (eight) hours as needed for nausea or vomiting., Disp: 20 tablet, Rfl: 1   prochlorperazine (COMPAZINE) 10 MG tablet, Take 1 tablet (10 mg total) by mouth every 6 (six) hours as needed for nausea or vomiting., Disp: 30 tablet, Rfl: 2 No current facility-administered medications for this visit.  Facility-Administered Medications Ordered in Other Visits:    0.9 %  sodium chloride infusion, , Intravenous, Continuous, Walisiewicz, Chavela Justiniano E, PA-C, Stopped at 08/23/22 1201   DOCEtaxel (TAXOTERE) 96 mg in sodium chloride 0.9 % 250 mL chemo infusion, 60 mg/m2 (Treatment Plan Recorded), Intravenous, Once, Johney Maine, MD, Last Rate: 260 mL/hr at 08/23/22 1333, 96 mg at 08/23/22 1333   heparin lock flush 100 unit/mL, 500 Units, Intracatheter, Once PRN, Jeanie Sewer T IV, MD   sodium chloride flush (NS) 0.9 % injection 10 mL, 10 mL, Intracatheter, PRN, Jaci Standard, MD  PHYSICAL EXAM: ECOG FS:1 - Symptomatic but completely ambulatory    Vitals:   08/23/22 1113 08/23/22 1202  BP: (!) 129/92   Pulse: (!) 112 (!) 106  Resp: 20   Temp: (!) 97.5 F (36.4 C)   TempSrc: Oral   SpO2: 99%    Physical Exam Vitals and nursing note reviewed.  Constitutional:      Appearance: She is not ill-appearing or toxic-appearing.  HENT:     Head: Normocephalic.     Mouth/Throat:     Mouth: Mucous membranes are dry.  Eyes:     Conjunctiva/sclera: Conjunctivae  normal.  Cardiovascular:     Rate and Rhythm: Regular rhythm. Tachycardia present.     Pulses: Normal pulses.     Heart sounds: Normal heart sounds.  Pulmonary:  Effort: Pulmonary effort is normal.     Breath sounds: Normal breath sounds.  Abdominal:     General: There is no distension.     Palpations: Abdomen is soft.     Tenderness: There is no abdominal tenderness.  Musculoskeletal:     Cervical back: Normal range of motion.  Skin:    General: Skin is warm and dry.     Comments: Stage 2 pressure ulcer on left upper buttock.   Neurological:     Mental Status: She is alert.        LABORATORY DATA: I have reviewed the data as listed    Latest Ref Rng & Units 08/23/2022   10:42 AM 08/02/2022   11:16 AM 07/20/2022    1:51 PM  CBC  WBC 4.0 - 10.5 K/uL 9.8  14.1  23.0   Hemoglobin 12.0 - 15.0 g/dL 16.1  09.6  04.5   Hematocrit 36.0 - 46.0 % 37.6  37.2  36.1   Platelets 150 - 400 K/uL 309  430  217         Latest Ref Rng & Units 08/23/2022   10:42 AM 08/02/2022   11:16 AM 07/20/2022    1:51 PM  CMP  Glucose 70 - 99 mg/dL 409  811  914   BUN 8 - 23 mg/dL 14  15  21    Creatinine 0.44 - 1.00 mg/dL 7.82  9.56  2.13   Sodium 135 - 145 mmol/L 136  138  136   Potassium 3.5 - 5.1 mmol/L 4.2  3.9  4.1   Chloride 98 - 111 mmol/L 102  102  102   CO2 22 - 32 mmol/L 28  29  26    Calcium 8.9 - 10.3 mg/dL 9.2  9.1  8.8   Total Protein 6.5 - 8.1 g/dL 6.9  7.1  6.7   Total Bilirubin 0.3 - 1.2 mg/dL 0.2  0.3  0.2   Alkaline Phos 38 - 126 U/L 86  102  142   AST 15 - 41 U/L 15  17  42   ALT 0 - 44 U/L 7  11  31         RADIOGRAPHIC STUDIES (from last 24 hours if applicable) I have personally reviewed the radiological images as listed and agreed with the findings in the report. No results found.      Visit Diagnosis: 1. Pressure injury of skin of buttock, unspecified injury stage, unspecified laterality   2. Malignant neoplasm of lower lobe of right lung (HCC)   3. Diarrhea,  unspecified type      Orders Placed This Encounter  Procedures   Ambulatory referral to Wound care center    Referral Priority:   Urgent    Referral Type:   Consultation    Number of Visits Requested:   1    All questions were answered. The patient knows to call the clinic with any problems, questions or concerns. No barriers to learning was detected.  A total of more than 30 minutes were spent on this encounter with face-to-face time and non-face-to-face time, including preparing to see the patient, ordering tests and/or medications, counseling the patient and coordination of care as outlined above.    Thank you for allowing me to participate in the care of this patient.    Shanon Ace, PA-C Department of Hematology/Oncology [facility] at [facility] Phone: [phone]  [phone]) [phone]    08/23/2022 2:05 PM

## 2022-08-23 NOTE — Patient Instructions (Signed)
[facility]  Discharge Instructions: Thank you for choosing [facility] to provide your oncology and hematology care.   If you have a lab appointment with the Cancer Center, please go directly to the Cancer Center and check in at the registration area.   Wear comfortable clothing and clothing appropriate for easy access to any Portacath or PICC line.   We strive to give you quality time with your provider. You may need to reschedule your appointment if you arrive late (15 or more minutes).  Arriving late affects you and other patients whose appointments are after yours.  Also, if you miss three or more appointments without notifying the office, you may be dismissed from the clinic at the provider's discretion.      For prescription refill requests, have your pharmacy contact our office and allow 72 hours for refills to be completed.    Today you received the following chemotherapy and/or immunotherapy agents: Cyramza & Docetaxel      To help prevent nausea and vomiting after your treatment, we encourage you to take your nausea medication as directed.  BELOW ARE SYMPTOMS THAT SHOULD BE REPORTED IMMEDIATELY: *FEVER GREATER THAN 100.4 F (38 C) OR HIGHER *CHILLS OR SWEATING *NAUSEA AND VOMITING THAT IS NOT CONTROLLED WITH YOUR NAUSEA MEDICATION *UNUSUAL SHORTNESS OF BREATH *UNUSUAL BRUISING OR BLEEDING *URINARY PROBLEMS (pain or burning when urinating, or frequent urination) *BOWEL PROBLEMS (unusual diarrhea, constipation, pain near the anus) TENDERNESS IN MOUTH AND THROAT WITH OR WITHOUT PRESENCE OF ULCERS (sore throat, sores in mouth, or a toothache) UNUSUAL RASH, SWELLING OR PAIN  UNUSUAL VAGINAL DISCHARGE OR ITCHING   Items with * indicate a potential emergency and should be followed up as soon as possible or go to the Emergency Department if any problems should occur.  Please show the CHEMOTHERAPY ALERT CARD or IMMUNOTHERAPY ALERT  CARD at check-in to the Emergency Department and triage nurse.  Should you have questions after your visit or need to cancel or reschedule your appointment, please contact [facility]  Dept: [phone]  and follow the prompts.  Office hours are 8:00 a.m. to 4:30 p.m. Monday - Friday. Please note that voicemails left after 4:00 p.m. may not be returned until the following business day.  We are closed weekends and major holidays. You have access to a nurse at all times for urgent questions. Please call the main number to the clinic Dept: [phone] and follow the prompts.   For any non-urgent questions, you may also contact your provider using MyChart. We now offer e-Visits for anyone 48 and older to request care online for non-urgent symptoms. For details visit [email or web address].   Also download the MyChart app! Go to the app store, search "MyChart", open the app, select [place], and log in with your MyChart username and password.

## 2022-08-23 NOTE — Progress Notes (Signed)
Per Namon Cirri, PA-C, ok to treat with heart rate of 106.

## 2022-08-24 ENCOUNTER — Other Ambulatory Visit: Payer: Self-pay

## 2022-08-24 ENCOUNTER — Encounter: Payer: Self-pay | Admitting: Hematology and Oncology

## 2022-08-24 MED ORDER — ALUM & MAG HYDROXIDE-SIMETH 200-200-20 MG/5ML PO SUSP
5.0000 mL | Freq: Three times a day (TID) | ORAL | 2 refills | Status: DC | PRN
Start: 2022-08-24 — End: 2022-08-29

## 2022-08-24 MED ORDER — MELOXICAM 7.5 MG PO TABS
7.5000 mg | ORAL_TABLET | Freq: Every day | ORAL | 1 refills | Status: DC
Start: 2022-08-24 — End: 2022-10-25

## 2022-08-24 MED ORDER — DIPHENHYDRAMINE HCL 12.5 MG/5ML PO LIQD
5.0000 mL | Freq: Three times a day (TID) | ORAL | 2 refills | Status: DC | PRN
Start: 2022-08-24 — End: 2022-08-24

## 2022-08-24 NOTE — Progress Notes (Signed)
Call received from pt pharmacy and they do not stock maalox for magic mouthwash, alternative approved by Lowella Bandy, NP, and confirmed with Asher Muir in pharmacy, see new orders

## 2022-08-24 NOTE — Addendum Note (Signed)
Addended by: Glee Arvin on: 08/24/2022 11:50 AM   Modules accepted: Orders

## 2022-08-25 ENCOUNTER — Ambulatory Visit: Payer: Medicaid Other

## 2022-08-25 ENCOUNTER — Other Ambulatory Visit (HOSPITAL_COMMUNITY): Payer: Self-pay

## 2022-08-25 ENCOUNTER — Other Ambulatory Visit: Payer: Self-pay

## 2022-08-25 ENCOUNTER — Inpatient Hospital Stay: Payer: Medicaid Other

## 2022-08-25 ENCOUNTER — Encounter: Payer: Self-pay | Admitting: Hematology and Oncology

## 2022-08-25 ENCOUNTER — Telehealth: Payer: Self-pay | Admitting: Nurse Practitioner

## 2022-08-25 ENCOUNTER — Telehealth: Payer: Self-pay

## 2022-08-25 ENCOUNTER — Inpatient Hospital Stay: Payer: Medicaid Other | Attending: Oncology

## 2022-08-25 VITALS — BP 120/85 | HR 116 | Temp 97.8°F | Resp 18

## 2022-08-25 DIAGNOSIS — G893 Neoplasm related pain (acute) (chronic): Secondary | ICD-10-CM

## 2022-08-25 DIAGNOSIS — C3490 Malignant neoplasm of unspecified part of unspecified bronchus or lung: Secondary | ICD-10-CM | POA: Diagnosis present

## 2022-08-25 DIAGNOSIS — Z5189 Encounter for other specified aftercare: Secondary | ICD-10-CM | POA: Insufficient documentation

## 2022-08-25 DIAGNOSIS — Z515 Encounter for palliative care: Secondary | ICD-10-CM

## 2022-08-25 LAB — T4: T4, Total: 7.1 ug/dL (ref 4.5–12.0)

## 2022-08-25 MED ORDER — PEGFILGRASTIM-CBQV 6 MG/0.6ML ~~LOC~~ SOSY
6.0000 mg | PREFILLED_SYRINGE | Freq: Once | SUBCUTANEOUS | Status: AC
  Administered 2022-08-25: 6 mg via SUBCUTANEOUS
  Filled 2022-08-25: qty 0.6

## 2022-08-25 MED ORDER — KETOROLAC TROMETHAMINE 10 MG PO TABS
10.0000 mg | ORAL_TABLET | Freq: Four times a day (QID) | ORAL | 0 refills | Status: DC | PRN
  Filled 2022-08-25: qty 20, 5d supply, fill #0

## 2022-08-25 MED ORDER — KETOROLAC TROMETHAMINE 30 MG/ML IJ SOLN
30.0000 mg | Freq: Once | INTRAMUSCULAR | Status: AC
  Administered 2022-08-25: 30 mg via INTRAMUSCULAR
  Filled 2022-08-25: qty 1

## 2022-08-25 NOTE — Telephone Encounter (Signed)
Per Lowella Bandy, NP, PO toradol ordered. Pt called and instructed to pick up and begin taking tomorrow, 08/26/22, and to hold her mobic while taking this medication. Pt verbalized understanding of instructions and has no questions at this time.

## 2022-08-25 NOTE — Progress Notes (Signed)
Pt called reporting that the toradol given during infusion helped her pain. Pt to come in today to receive udency injection, per [place], NP may also receive another toradol injection. Pt made aware this is a short-term med, verbalized understanding. No further needs at this time.

## 2022-08-26 ENCOUNTER — Telehealth: Payer: Self-pay | Admitting: Physician Assistant

## 2022-08-26 ENCOUNTER — Other Ambulatory Visit (HOSPITAL_COMMUNITY): Payer: Self-pay

## 2022-08-29 ENCOUNTER — Telehealth: Payer: Self-pay | Admitting: *Deleted

## 2022-08-29 ENCOUNTER — Other Ambulatory Visit: Payer: Self-pay

## 2022-08-29 DIAGNOSIS — B370 Candidal stomatitis: Secondary | ICD-10-CM

## 2022-08-29 DIAGNOSIS — C3431 Malignant neoplasm of lower lobe, right bronchus or lung: Secondary | ICD-10-CM

## 2022-08-29 DIAGNOSIS — Z515 Encounter for palliative care: Secondary | ICD-10-CM

## 2022-08-29 MED ORDER — NYSTATIN 100000 UNIT/ML MT SUSP: 10.0000 mL | Freq: Three times a day (TID) | OROMUCOSAL | 2 refills | Status: DC

## 2022-08-29 NOTE — Telephone Encounter (Signed)
-----   Message from Briant Cedar, PA-C sent at 08/29/2022  2:24 PM EDT ----- Can you request PCP follow regarding thyroid levels.    ----- Message ----- From: Leory Plowman, Lab In [place] Sent: 08/23/2022  11:23 AM EDT To: Jaci Standard, MD

## 2022-08-29 NOTE — Telephone Encounter (Signed)
Fax'd pt's lab results to her PCP @ [facility] with request for her PCP to manage her Thyroid results

## 2022-08-29 NOTE — Telephone Encounter (Signed)
Pt called for a refill of magic mouth wash since her previous pharmacy was not able to fill, new order sent to new pharmacy

## 2022-09-09 ENCOUNTER — Ambulatory Visit (HOSPITAL_COMMUNITY)
Admission: RE | Admit: 2022-09-09 | Discharge: 2022-09-09 | Disposition: A | Discharge status: Home / Self Care | Payer: Medicaid Other | Source: Ambulatory Visit | Attending: Physician Assistant | Admitting: Physician Assistant

## 2022-09-09 DIAGNOSIS — C3490 Malignant neoplasm of unspecified part of unspecified bronchus or lung: Secondary | ICD-10-CM | POA: Diagnosis present

## 2022-09-09 MED ORDER — IOHEXOL 300 MG/ML  SOLN
100.0000 mL | Freq: Once | INTRAMUSCULAR | Status: AC | PRN
  Administered 2022-09-09: 100 mL via INTRAVENOUS

## 2022-09-13 ENCOUNTER — Other Ambulatory Visit: Payer: Medicaid Other

## 2022-09-13 ENCOUNTER — Ambulatory Visit: Payer: Medicaid Other

## 2022-09-13 ENCOUNTER — Ambulatory Visit: Payer: Medicaid Other | Admitting: Hematology and Oncology

## 2022-09-15 ENCOUNTER — Ambulatory Visit: Payer: Medicaid Other

## 2022-09-20 ENCOUNTER — Encounter (HOSPITAL_BASED_OUTPATIENT_CLINIC_OR_DEPARTMENT_OTHER): Payer: Medicaid Other | Attending: General Surgery | Admitting: General Surgery

## 2022-09-20 ENCOUNTER — Telehealth: Payer: Self-pay

## 2022-09-20 DIAGNOSIS — L89313 Pressure ulcer of right buttock, stage 3: Secondary | ICD-10-CM | POA: Diagnosis present

## 2022-09-20 DIAGNOSIS — J449 Chronic obstructive pulmonary disease, unspecified: Secondary | ICD-10-CM | POA: Diagnosis not present

## 2022-09-20 DIAGNOSIS — F17210 Nicotine dependence, cigarettes, uncomplicated: Secondary | ICD-10-CM | POA: Diagnosis not present

## 2022-09-20 DIAGNOSIS — C3431 Malignant neoplasm of lower lobe, right bronchus or lung: Secondary | ICD-10-CM | POA: Insufficient documentation

## 2022-09-20 DIAGNOSIS — C679 Malignant neoplasm of bladder, unspecified: Secondary | ICD-10-CM | POA: Diagnosis not present

## 2022-09-20 DIAGNOSIS — G8929 Other chronic pain: Secondary | ICD-10-CM | POA: Insufficient documentation

## 2022-09-20 DIAGNOSIS — I700 Atherosclerosis of aorta: Secondary | ICD-10-CM | POA: Diagnosis not present

## 2022-09-20 NOTE — Telephone Encounter (Signed)
Pt called reporting that she had a fall over the weekend and was experiencing pain from it. Pt reported that she was walking down stairs and fell down the last two. Pt reporting catching herself on her arms and denied hitting anything else, her pain is in her hip from where she was tense. Pt advised on how to take existing medication to the fullest, and ED precautions, pt verbalized understanding, no further needs at this time.

## 2022-09-20 NOTE — Progress Notes (Signed)
Lauren Salazar ([id number]) [id number] Page 1 of 4 Visit Report for 09/20/2022 Abuse Risk Screen Details Patient Name: Date of Service: Lauren Salazar, Lauren Salazar 09/20/2022 1:30 PM Medical Record Number: [id number] Patient Account Number: [id number] Date of Birth/Sex: Treating RN: 22-Oct-1960 (62 y.o. Fredderick Phenix Primary Care Medard Decuir: Hale Bogus, [place] GLE Other Clinician: Referring Amor Hyle: Treating Amoura Ransier/Extender: Gerilyn Pilgrim, KA ITLYN Weeks in Treatment: 0 Abuse Risk Screen Items Answer ABUSE RISK SCREEN: Has anyone close to you tried to hurt or harm you recentlyo No Do you feel uncomfortable with anyone in your familyo No Has anyone forced you do things that you didnt want to doo No Electronic Signature(s) Signed: 09/20/2022 4:11:00 PM By: Samuella Bruin Entered By: Samuella Bruin on 09/20/2022 13:46:27 -------------------------------------------------------------------------------- Activities of Daily Living Details Patient Name: Date of Service: Lauren Salazar 09/20/2022 1:30 PM Medical Record Number: [id number] Patient Account Number: [id number] Date of Birth/Sex: Treating RN: 08/25/1960 (62 y.o. Fredderick Phenix Primary Care Amena Dockham: Hale Bogus, [place] GLE Other Clinician: Referring Kellye Mizner: Treating Jachai Okazaki/Extender: Gerilyn Pilgrim, KA ITLYN Weeks in Treatment: 0 Activities of Daily Living Items Answer Activities of Daily Living (Please select one for each item) Drive Automobile Completely Able T Medications ake Completely Able Use T elephone Completely Able Care for Appearance Completely Able Use T oilet Completely Able Bath / Shower Completely Able Dress Self Completely Able Feed Self Completely Able Walk Need Assistance Get In / Out Bed Completely Able Housework Need Assistance Prepare Meals Completely Able Handle Money Completely Able Shop for Self Need  Assistance Electronic Signature(s) Signed: 09/20/2022 4:11:00 PM By: Samuella Bruin Entered By: Samuella Bruin on 09/20/2022 13:46:46 -------------------------------------------------------------------------------- Education Screening Details Patient Name: Date of Service: Lauren Flattery Salazar. 09/20/2022 1:30 PM Medical Record Number: [id number] Patient Account Number: [id number] Date of Birth/Sex: Treating RN: February 16, 1961 (62 y.o. Fredderick Phenix Primary Care Chesni Vos: Hale Bogus, [place] GLE Other Clinician: Referring Marzetta Lanza: Treating Renad Jenniges/Extender: Gerilyn Pilgrim, KA ITLYN Weeks in TreatmentHAIZEL, CASSEY Salazar ([id number]) [id number] Page 2 of 4 Primary Learner Assessed: Patient Learning Preferences/Education Level/Primary Language Learning Preference: Explanation, Demonstration, Video, Printed Material Highest Education Level: College or Above Preferred Language: Economist Language Barrier: No Translator Needed: No Memory Deficit: No Emotional Barrier: No Cultural/Religious Beliefs Affecting Medical Care: No Physical Barrier Impaired Vision: Yes Glasses Impaired Hearing: No Decreased Hand dexterity: No Knowledge/Comprehension Knowledge Level: Medium Comprehension Level: Medium Ability to understand written instructions: Medium Ability to understand verbal instructions: Medium Motivation Anxiety Level: Calm Cooperation: Cooperative Education Importance: Acknowledges Need Interest in Health Problems: Asks Questions Perception: Coherent Willingness to Engage in Self-Management Medium Activities: Readiness to Engage in Self-Management Medium Activities: Electronic Signature(s) Signed: 09/20/2022 4:11:00 PM By: Samuella Bruin Entered By: Samuella Bruin on 09/20/2022 13:47:05 -------------------------------------------------------------------------------- Fall Risk Assessment  Details Patient Name: Date of Service: Lauren Flattery Salazar. 09/20/2022 1:30 PM Medical Record Number: [id number] Patient Account Number: [id number] Date of Birth/Sex: Treating RN: 11-24-1960 (62 y.o. Fredderick Phenix Primary Care Artisha Capri: Hale Bogus, [place] GLE Other Clinician: Referring Baleria Wyman: Treating Woody Kronberg/Extender: Gerilyn Pilgrim, KA ITLYN Weeks in Treatment: 0 Fall Risk Assessment Items Have you had 2 or more falls in the last 12 monthso 0 Yes Have you had any fall that resulted in injury in the last 12 monthso 0 Yes FALLS RISK SCREEN History of falling - immediate or within 3 months 25 Yes Secondary diagnosis (Do you have 2 or more  medical diagnoseso) 15 Yes Ambulatory aid None/bed rest/wheelchair/nurse 0 No Crutches/cane/walker 15 Yes Furniture 0 No Intravenous therapy Access/Saline/Heparin Lock 0 No Gait/Transferring Normal/ bed rest/ wheelchair 0 Yes Weak (short steps with or without shuffle, stooped but able to lift head while walking, may seek 10 Yes support from furniture) Impaired (short steps with shuffle, may have difficulty arising from chair, head down, impaired 20 Yes balance) Mental Status Oriented to own ability 0 Yes Overestimates or forgets limitations 0 No Risk Level: High Risk Score: [address] Lauren Salazar ([id number]) [id number] [id number] Page 3 of 4 Electronic Signature(s) -------------------------------------------------------------------------------- Foot Assessment Details Patient Name: Date of Service: Lauren Salazar 09/20/2022 1:30 PM Medical Record Number: [id number] Patient Account Number: [id number] Date of Birth/Sex: Treating RN: 05/28/1960 (62 y.o. Fredderick Phenix Primary Care Solan Vosler: Hale Bogus, [place] GLE Other Clinician: Referring Edwin Baines: Treating Rodderick Holtzer/Extender: Gerilyn Pilgrim, KA ITLYN Weeks in Treatment: 0 Foot Assessment Items Site Locations + =  Sensation present, - = Sensation absent, C = Callus, U = Ulcer R = Redness, W = Warmth, M = Maceration, PU = Pre-ulcerative lesion F = Fissure, S = Swelling, Salazar = Dryness Assessment Right: Left: Other Deformity: No No Prior Foot Ulcer: No No Prior Amputation: No No Charcot Joint: No No Ambulatory Status: Ambulatory With Help Assistance Device: Walker Gait: Surveyor, mining) Signed: 09/20/2022 4:11:00 PM By: Samuella Bruin Entered By: Samuella Bruin on 09/20/2022 13:49:54 -------------------------------------------------------------------------------- Nutrition Risk Screening Details Patient Name: Date of Service: CHICQUITA, CASHION Salazar. 09/20/2022 1:30 PM Medical Record Number: [id number] Patient Account Number: [id number] Date of Birth/Sex: Treating RN: 28-Jul-1960 (62 y.o. Fredderick Phenix Primary Care Krystle Polcyn: Hale Bogus, [place] GLE Other Clinician: Referring Terel Bann: Treating Brookelle Pellicane/Extender: Gerilyn Pilgrim, KA ITLYN Weeks in Treatment: 0 Height (in): 66 Weight (lbs): 117 Body Mass Index (BMI): 18.9 RAMEKA, VANDERHEYDEN Salazar ([id number]) [id number] [id number] Page 4 of 4 Nutrition Risk Screening Items Score Screening NUTRITION RISK SCREEN: I have an illness or condition that made me change the kind and/or amount of food I eat 2 Yes I eat fewer than two meals per day 3 Yes I eat few fruits and vegetables, or milk products 2 Yes I have three or more drinks of beer, liquor or wine almost every day 0 No I have tooth or mouth problems that make it hard for me to eat 0 No I don't always have enough money to buy the food I need 0 No I eat alone most of the time 1 Yes I take three or more different prescribed or over-the-counter drugs a day 1 Yes Without wanting to, I have lost or gained 10 pounds in the last six months 2 Yes I am not always physically able to shop, cook and/or feed myself 2 Yes Nutrition Protocols Good  Risk Protocol Moderate Risk Protocol High Risk Proctocol 0 Provide education on nutrition Risk Level: High Risk Score: 13 Electronic Signature(s) Signed: 09/20/2022 4:11:00 PM By: Samuella Bruin Entered By: Samuella Bruin on 09/20/2022 13:49:37

## 2022-10-04 ENCOUNTER — Inpatient Hospital Stay: Payer: Medicaid Other | Attending: Oncology

## 2022-10-04 ENCOUNTER — Inpatient Hospital Stay (HOSPITAL_BASED_OUTPATIENT_CLINIC_OR_DEPARTMENT_OTHER): Payer: Medicaid Other | Admitting: Nurse Practitioner

## 2022-10-04 ENCOUNTER — Other Ambulatory Visit: Payer: Self-pay

## 2022-10-04 ENCOUNTER — Encounter: Payer: Self-pay | Admitting: Nurse Practitioner

## 2022-10-04 ENCOUNTER — Inpatient Hospital Stay (HOSPITAL_BASED_OUTPATIENT_CLINIC_OR_DEPARTMENT_OTHER): Payer: Medicaid Other | Admitting: Physician Assistant

## 2022-10-04 ENCOUNTER — Inpatient Hospital Stay: Payer: Medicaid Other

## 2022-10-04 VITALS — BP 151/91 | HR 99 | Temp 97.7°F | Resp 19 | Wt 114.2 lb

## 2022-10-04 DIAGNOSIS — C3490 Malignant neoplasm of unspecified part of unspecified bronchus or lung: Secondary | ICD-10-CM

## 2022-10-04 DIAGNOSIS — Z95828 Presence of other vascular implants and grafts: Secondary | ICD-10-CM | POA: Insufficient documentation

## 2022-10-04 DIAGNOSIS — K5903 Drug induced constipation: Secondary | ICD-10-CM | POA: Diagnosis not present

## 2022-10-04 DIAGNOSIS — E538 Deficiency of other specified B group vitamins: Secondary | ICD-10-CM

## 2022-10-04 DIAGNOSIS — Z515 Encounter for palliative care: Secondary | ICD-10-CM

## 2022-10-04 DIAGNOSIS — G893 Neoplasm related pain (acute) (chronic): Secondary | ICD-10-CM | POA: Insufficient documentation

## 2022-10-04 DIAGNOSIS — Z90710 Acquired absence of both cervix and uterus: Secondary | ICD-10-CM | POA: Insufficient documentation

## 2022-10-04 DIAGNOSIS — Z90722 Acquired absence of ovaries, bilateral: Secondary | ICD-10-CM | POA: Diagnosis not present

## 2022-10-04 DIAGNOSIS — C679 Malignant neoplasm of bladder, unspecified: Secondary | ICD-10-CM

## 2022-10-04 DIAGNOSIS — Z5111 Encounter for antineoplastic chemotherapy: Secondary | ICD-10-CM | POA: Diagnosis present

## 2022-10-04 DIAGNOSIS — C3431 Malignant neoplasm of lower lobe, right bronchus or lung: Secondary | ICD-10-CM | POA: Insufficient documentation

## 2022-10-04 DIAGNOSIS — F17210 Nicotine dependence, cigarettes, uncomplicated: Secondary | ICD-10-CM | POA: Insufficient documentation

## 2022-10-04 LAB — CMP (CANCER CENTER ONLY)
ALT: 8 U/L (ref 0–44)
AST: 15 U/L (ref 15–41)
Albumin: 3.6 g/dL (ref 3.5–5.0)
Alkaline Phosphatase: 109 U/L (ref 38–126)
Anion gap: 7 (ref 5–15)
BUN: 17 mg/dL (ref 8–23)
CO2: 26 mmol/L (ref 22–32)
Calcium: 9.2 mg/dL (ref 8.9–10.3)
Chloride: 108 mmol/L (ref 98–111)
Creatinine: 0.83 mg/dL (ref 0.44–1.00)
GFR, Estimated: >60 mL/min (ref >60)
Glucose, Bld: 106 mg/dL — ABNORMAL HIGH (ref 70–99)
Potassium: 4.3 mmol/L (ref 3.5–5.1)
Sodium: 141 mmol/L (ref 135–145)
Total Bilirubin: 0.3 mg/dL (ref 0.3–1.2)
Total Protein: 7.1 g/dL (ref 6.5–8.1)

## 2022-10-04 LAB — VITAMIN B12: Vitamin B-12: 445 pg/mL (ref 180–914)

## 2022-10-04 LAB — CBC WITH DIFFERENTIAL (CANCER CENTER ONLY)
Abs Immature Granulocytes: 0.02 10*3/uL (ref 0.00–0.07)
Basophils Absolute: 0.1 10*3/uL (ref 0.0–0.1)
Basophils Relative: 1 %
Eosinophils Absolute: 0.3 10*3/uL (ref 0.0–0.5)
Eosinophils Relative: 4 %
HCT: 38.4 % (ref 36.0–46.0)
Hemoglobin: 12.2 g/dL (ref 12.0–15.0)
Immature Granulocytes: 0 %
Lymphocytes Relative: 8 %
Lymphs Abs: 0.6 10*3/uL — ABNORMAL LOW (ref 0.7–4.0)
MCH: 29.9 pg (ref 26.0–34.0)
MCHC: 31.8 g/dL (ref 30.0–36.0)
MCV: 94.1 fL (ref 80.0–100.0)
Monocytes Absolute: 0.7 10*3/uL (ref 0.1–1.0)
Monocytes Relative: 8 %
Neutro Abs: 6.8 10*3/uL (ref 1.7–7.7)
Neutrophils Relative %: 79 %
Platelet Count: 273 10*3/uL (ref 150–400)
RBC: 4.08 MIL/uL (ref 3.87–5.11)
RDW: 18 % — ABNORMAL HIGH (ref 11.5–15.5)
WBC Count: 8.5 10*3/uL (ref 4.0–10.5)
nRBC: 0 % (ref 0.0–0.2)

## 2022-10-04 MED ORDER — DIPHENHYDRAMINE HCL 50 MG/ML IJ SOLN
50.0000 mg | Freq: Once | INTRAMUSCULAR | Status: AC
  Administered 2022-10-04: 50 mg via INTRAVENOUS
  Filled 2022-10-04: qty 1

## 2022-10-04 MED ORDER — SODIUM CHLORIDE 0.9 % IV SOLN
60.0000 mg/m2 | Freq: Once | INTRAVENOUS | Status: AC
  Administered 2022-10-04: 96 mg via INTRAVENOUS
  Filled 2022-10-04: qty 9.6

## 2022-10-04 MED ORDER — SODIUM CHLORIDE 0.9 % IV SOLN: Freq: Once | INTRAVENOUS | Status: AC

## 2022-10-04 MED ORDER — SODIUM CHLORIDE 0.9 % IV SOLN
150.0000 mg | Freq: Once | INTRAVENOUS | Status: AC
  Administered 2022-10-04: 150 mg via INTRAVENOUS
  Filled 2022-10-04: qty 150

## 2022-10-04 MED ORDER — SODIUM CHLORIDE 0.9 % IV SOLN
500.0000 mg | Freq: Once | INTRAVENOUS | Status: AC
  Administered 2022-10-04: 500 mg via INTRAVENOUS
  Filled 2022-10-04: qty 50

## 2022-10-04 MED ORDER — SODIUM CHLORIDE 0.9 % IV SOLN
10.0000 mg | Freq: Once | INTRAVENOUS | Status: AC
  Administered 2022-10-04: 10 mg via INTRAVENOUS
  Filled 2022-10-04: qty 10

## 2022-10-04 MED ORDER — XTAMPZA ER 13.5 MG PO C12A: 13.5000 mg | EXTENDED_RELEASE_CAPSULE | Freq: Two times a day (BID) | ORAL | 0 refills | Status: AC | PRN

## 2022-10-04 MED ORDER — HEPARIN SOD (PORK) LOCK FLUSH 100 UNIT/ML IV SOLN
500.0000 [IU] | Freq: Once | INTRAVENOUS | Status: AC | PRN
  Administered 2022-10-04: 500 [IU]

## 2022-10-04 MED ORDER — LIDOCAINE 5 % EX PTCH
1.0000 | MEDICATED_PATCH | CUTANEOUS | 0 refills | Status: DC
Start: 2022-10-04 — End: 2022-12-20

## 2022-10-04 MED ORDER — ACETAMINOPHEN 325 MG PO TABS
650.0000 mg | ORAL_TABLET | Freq: Once | ORAL | Status: AC
  Administered 2022-10-04: 650 mg via ORAL
  Filled 2022-10-04: qty 2

## 2022-10-04 MED ORDER — SODIUM CHLORIDE 0.9% FLUSH
10.0000 mL | INTRAVENOUS | Status: DC | PRN
  Administered 2022-10-04: 10 mL

## 2022-10-04 MED ORDER — LIDOCAINE-PRILOCAINE 2.5-2.5 % EX CREA: 1.0000 | TOPICAL_CREAM | CUTANEOUS | 0 refills | Status: DC | PRN

## 2022-10-04 NOTE — Progress Notes (Signed)
Decrease Cyramza dose based on most current weight per MD.  Richardean Sale, RPH, BCPS, BCOP 10/04/2022 1:50 PM

## 2022-10-04 NOTE — Progress Notes (Signed)
Palliative Medicine [facility]  Telephone:([phone]) [phone] Fax:(336) [phone]   Name: Lauren Salazar Date: 10/04/2022 MRN: [id number]  DOB: 04-18-1961  Patient Care Team: [facility] [place] as PCP - General (Family Medicine)   INTERVAL HISTORY: Lauren Salazar is a 62 y.o. 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 Patient is also status post robotic assisted laparoscopic cystectomy and pelvic exoneration including hysterectomy, bilateral oophorectomy and ileoconduit diversion. Palliative ask to see for symptom and pain management and goals of care.   SOCIAL HISTORY:    Lauren Salazar reports that she has been smoking cigarettes. She has been smoking an average of 1 pack per day. She has never used smokeless tobacco. She reports that she does not drink alcohol and does not use drugs.  ADVANCE DIRECTIVES:  None on file  CODE STATUS: Full code  PAST MEDICAL HISTORY: Past Medical History:  Diagnosis Date   Bladder tumor    Cancer (HCC)    Complication of anesthesia    slow to wake after spinal fusion, then was "looney" for the following 24 hours    Persistent dry cough    ongoing since feb 2020    ALLERGIES:  has No Known Allergies.  MEDICATIONS:  Current Outpatient Medications  Medication Sig Dispense Refill   benzonatate (TESSALON) 100 MG capsule Take 2 capsules (200 mg total) by mouth 3 (three) times daily as needed for cough. 20 capsule 2   diphenoxylate-atropine ([name]) 2.5-0.025 MG tablet Take 1 tablet by mouth 4 (four) times daily as needed for diarrhea or loose stools. 30 tablet 0   folic acid (FOLVITE) 1 MG tablet TAKE 1 TABLET(1 MG) BY MOUTH DAILY 90 tablet 3   HYDROcodone-acetaminophen (NORCO) 5-325 MG tablet Take 1 tablet by mouth every 4 (four) hours as needed for moderate pain. 120 tablet 0   ketorolac  (TORADOL) 10 MG tablet Take 1 tablet (10 mg total) by mouth every 6 (six) hours as needed. 20 tablet 0   magic mouthwash (nystatin, diphenhydrAMINE, alum & mag hydroxide) suspension mixture Swish and swallow 10 mLs 3 (three) times daily. 240 mL 2   meloxicam (MOBIC) 7.5 MG tablet Take 1 tablet (7.5 mg total) by mouth daily. 30 tablet 1   nicotine (NICODERM CQ - DOSED IN MG/24 HOURS) 21 mg/24hr patch Place 21 mg onto the skin daily. (Patient not taking: Reported on 12/22/2020)     ondansetron (ZOFRAN) 8 MG tablet Take 1 tablet (8 mg total) by mouth every 8 (eight) hours as needed for nausea or vomiting. 20 tablet 1   prochlorperazine (COMPAZINE) 10 MG tablet Take 1 tablet (10 mg total) by mouth every 6 (six) hours as needed for nausea or vomiting. 30 tablet 2   No current facility-administered medications for this visit.    VITAL SIGNS: There were no vitals taken for this visit. There were no vitals filed for this visit.  Estimated body mass index is 19.47 kg/m as calculated from the following:   Height as of 07/25/22: 5\' 6"  (1.676 m).   Weight as of 08/02/22: 120 lb 9.6 oz (54.7 kg).   PERFORMANCE STATUS (ECOG) : 1 - Symptomatic but completely ambulatory  Physical Exam: General: NAD Cardiovascular: regular rate and rhythm Pulmonary: regular, even, and unlabored Extremities: no edema, no joint deformities Neurological: alert, oriented x 4, mood appropriate   IMPRESSION: I saw Lauren Salazar  during infusion. No family present. She is doing well overall. Continues to have pain. Denies nausea, vomiting, constipation, or diarrhea. Is trying to remain as active as possible however states this is difficult due to her pain and limited mobility. Recent fall which has caused some increase in her discomfort.   Recent family beach vacation.   Neoplasm related pain Lauren Salazar endorses continued back and right side/hip pain. Describes pain as sharp, constant, and throbbing.   Currently taking one  hydrocodone every 4-6 hours as needed for pain. Mobic daily. She has discussed with [name], [place] as well as myself given ongoing pain consideration of adding long-acting medication for better control. She expresses concern with taking oral medication twice daily. Education provided on pill organizer, alarm setting, and taking when she takes other morning and night medications. She mentions difficulty in remembering to taking other medications. Again re-emphasized given expressed level of pain in order to gain some control adherence and schedule will be key to managing. Lauren Salazar mentions possible use of pain patch. Education provided on lidocaine patch for topical relief.   We will plan to continue close monitoring and adjust as needed. She will be started on [place] per discussions with [place], [place].   Encouraged bowel regimen in the setting of opioid use to prevent constipation.    PLAN:  Norco one tablet every 4-6 hours asa needed  Mobic daily. Miralax daily  Lidocaine patch  We will continue to closely monitor and assist with symptom management as needed. Palliative will plan to see patient back in 3-4 weeks in collaboration to other oncology appointments.    Patient expressed understanding and was in agreement with this plan. She also understands that She can call the clinic at any time with any questions, concerns, or complaints.   Any controlled substances utilized were prescribed in the context of palliative care. PDMP has been reviewed.    Visit consisted of counseling and education dealing with the complex and emotionally intense issues of symptom management and palliative care in the setting of serious and potentially life-threatening illness.Greater than 50%  of this time was spent counseling and coordinating care related to the above assessment and plan.   [name], AGPCNP-BC  Palliative Medicine Team/[facility]  *Please note that this is a verbal  dictation therefore any spelling or grammatical errors are due to the "Dragon Medical One" system interpretation.

## 2022-10-04 NOTE — Patient Instructions (Addendum)
[place] [facility]  Discharge Instructions: Thank you for choosing [facility] to provide your oncology and hematology care.   If you have a lab appointment with the Cancer Center, please go directly to the Cancer Center and check in at the registration area.   Wear comfortable clothing and clothing appropriate for easy access to any Portacath or PICC line.   We strive to give you quality time with your provider. You may need to reschedule your appointment if you arrive late (15 or more minutes).  Arriving late affects you and other patients whose appointments are after yours.  Also, if you miss three or more appointments without notifying the office, you may be dismissed from the clinic at the provider's discretion.      For prescription refill requests, have your pharmacy contact our office and allow 72 hours for refills to be completed.    Today you received the following chemotherapy and/or immunotherapy agents: Cyramza and Taxotere      To help prevent nausea and vomiting after your treatment, we encourage you to take your nausea medication as directed.  BELOW ARE SYMPTOMS THAT SHOULD BE REPORTED IMMEDIATELY: *FEVER GREATER THAN 100.4 F (38 C) OR HIGHER *CHILLS OR SWEATING *NAUSEA AND VOMITING THAT IS NOT CONTROLLED WITH YOUR NAUSEA MEDICATION *UNUSUAL SHORTNESS OF BREATH *UNUSUAL BRUISING OR BLEEDING *URINARY PROBLEMS (pain or burning when urinating, or frequent urination) *BOWEL PROBLEMS (unusual diarrhea, constipation, pain near the anus) TENDERNESS IN MOUTH AND THROAT WITH OR WITHOUT PRESENCE OF ULCERS (sore throat, sores in mouth, or a toothache) UNUSUAL RASH, SWELLING OR PAIN  UNUSUAL VAGINAL DISCHARGE OR ITCHING   Items with * indicate a potential emergency and should be followed up as soon as possible or go to the Emergency Department if any problems should occur.  Please show the CHEMOTHERAPY ALERT CARD or IMMUNOTHERAPY ALERT  CARD at check-in to the Emergency Department and triage nurse.  Should you have questions after your visit or need to cancel or reschedule your appointment, please contact [place] [facility]  Dept: [phone]  and follow the prompts.  Office hours are 8:00 a.m. to 4:30 p.m. Monday - Friday. Please note that voicemails left after 4:00 p.m. may not be returned until the following business day.  We are closed weekends and major holidays. You have access to a nurse at all times for urgent questions. Please call the main number to the clinic Dept: [phone] and follow the prompts.   For any non-urgent questions, you may also contact your provider using MyChart. We now offer e-Visits for anyone 44 and older to request care online for non-urgent symptoms. For details visit [email or web address].   Also download the MyChart app! Go to the app store, search "MyChart", open the app, select [place], and log in with your MyChart username and password.

## 2022-10-04 NOTE — Progress Notes (Signed)
[facility] [phone]   [phone]  PROGRESS NOTE  Patient Care Team: [facility] [place] as PCP - General (Family Medicine)  Hematological/Oncological History # Metastatic Adenosquamous Lung Cancer  05/10/2022: last visit with Dr. Clelia Croft 05/26/2022: CT scan showed enlargement of RLL mass, right iliac crest mass with large soft tissue components extending in the iliacus and gluteus musculature. Additionally new pathologic right external iliac adenopathy.  06/07/2022: transition care to Dr. Leonides Schanz  07/12/2022: Cycle 1 Day 1 of docetaxel and ramucirumab 08/02/2022: Cycle 2 Day 1 of docetaxel and ramucirumab 08/23/2022: Cycle 3 Day 1 of docetaxel and ramucirumab 10/04/2022: Cycle 4 Day 1 of docetaxel and ramucirumab  Interval History:  Lauren Salazar 62 y.o. female with medical history significant for metastatic adenosquamous lung cancer presents for a follow up visit. The patient's last visit was on 08/23/2022. She presents today for a follow up before Cycle 4, Day 1  Lauren Salazar reports that she enjoyed her time off at [employer]. She reports she still has fatigue but feels better. She still struggles with appetite loss and weight loss. She denies nausea, vomiting or abdominal pain. She continues to have right sided back/hip pain that improves temporarily with Norco that was prescribed. She only takes the pain medication before bed time but is short lasting. She reports the pain does interfere with her sleep. She reports her bowel habits improved during her treatment break.  She denies easy bruising or signs of bleeding. She denies fevers, chills, sweats, shortness of breath, chest pain or cough. She has no other complaints. Rest of the 10 point ROS was otherwise negative.  She notes that she is willing and able to proceed with chemotherapy treatment today.  MEDICAL HISTORY:  Past Medical History:  Diagnosis Date   Bladder tumor     Cancer (HCC)    Complication of anesthesia    slow to wake after spinal fusion, then was "looney" for the following 24 hours    Persistent dry cough    ongoing since feb 2020    SURGICAL HISTORY: Past Surgical History:  Procedure Laterality Date   CERVICAL FUSION  2005   c5-c6      CYSTOSCOPY WITH INJECTION N/A 02/01/2019   Procedure: CYSTOSCOPY WITH INJECTION;  Surgeon: Sebastian Ache, MD;  Location: [facility];  Service: Urology;  Laterality: N/A;   IR IMAGING GUIDED PORT INSERTION  07/25/2022   LYMPHADENECTOMY Bilateral 02/01/2019   Procedure: LYMPHADENECTOMY;  Surgeon: Sebastian Ache, MD;  Location: [facility];  Service: Urology;  Laterality: Bilateral;   ROBOT ASSISTED LAPAROSCOPIC COMPLETE CYSTECT ILEAL CONDUIT N/A 02/01/2019   Procedure: XI ROBOTIC ASSISTED LAPAROSCOPIC COMPLETE CYSTECTOMY, ILEAL CONDUIT, HYSTERECTOMY WITH BILATERAL SALPINGOOPHORECTOMY;  Surgeon: Sebastian Ache, MD;  Location: [facility];  Service: Urology;  Laterality: N/A;  6 HRS   TRANSURETHRAL RESECTION OF BLADDER TUMOR N/A 10/31/2018   Procedure: TRANSURETHRAL RESECTION OF BLADDER TUMOR (TURBT) WITH CYSTOSCOPY/ POSSIBLE INTRAVESICAL GEMCITABINE;  Surgeon: Rene Paci, MD;  Location: [facility];  Service: Urology;  Laterality: N/A;    SOCIAL HISTORY: Social History   Socioeconomic History   Marital status: Divorced    Spouse name: Not on file   Number of children: Not on file   Years of education: Not on file   Highest education level: Not on file  Occupational History   Not on file  Tobacco Use   Smoking status: Every Day    Packs/day: 1    Types: Cigarettes  Smokeless tobacco: Never   Tobacco comments:    smoking since age 38 , she is cutting back, using patches, trying to stop. 04/02/19  Vaping Use   Vaping Use: Never used  Substance and Sexual Activity   Alcohol use: No   Drug use: No   Sexual activity: Not Currently  Other Topics Concern   Not on file  Social History Narrative   Not on  file   Social Determinants of Health   Financial Resource Strain: Not on file  Food Insecurity: Not on file  Transportation Needs: No Transportation Needs (04/02/2019)   PRAPARE - Administrator, Civil Service (Medical): No    Lack of Transportation (Non-Medical): No  Physical Activity: Not on file  Stress: Not on file  Social Connections: Not on file  Intimate Partner Violence: Not At Risk (04/02/2019)   Humiliation, Afraid, Rape, and Kick questionnaire    Fear of Current or Ex-Partner: No    Emotionally Abused: No    Physically Abused: No    Sexually Abused: No    FAMILY HISTORY: No family history on file.  ALLERGIES:  has No Known Allergies.  MEDICATIONS:  Current Outpatient Medications  Medication Sig Dispense Refill   lidocaine-prilocaine (EMLA) cream Apply 1 Application topically as needed. 30 g 0   oxyCODONE ER (XTAMPZA ER) 13.5 MG C12A Take 13.5 mg by mouth every 12 (twelve) hours as needed. 60 capsule 0   benzonatate (TESSALON) 100 MG capsule Take 2 capsules (200 mg total) by mouth 3 (three) times daily as needed for cough. 20 capsule 2   diphenoxylate-atropine ([name]) 2.5-0.025 MG tablet Take 1 tablet by mouth 4 (four) times daily as needed for diarrhea or loose stools. 30 tablet 0   folic acid (FOLVITE) 1 MG tablet TAKE 1 TABLET(1 MG) BY MOUTH DAILY 90 tablet 3   HYDROcodone-acetaminophen (NORCO) 5-325 MG tablet Take 1 tablet by mouth every 4 (four) hours as needed for moderate pain. 120 tablet 0   ketorolac (TORADOL) 10 MG tablet Take 1 tablet (10 mg total) by mouth every 6 (six) hours as needed. 20 tablet 0   magic mouthwash (nystatin, diphenhydrAMINE, alum & mag hydroxide) suspension mixture Swish and swallow 10 mLs 3 (three) times daily. 240 mL 2   meloxicam (MOBIC) 7.5 MG tablet Take 1 tablet (7.5 mg total) by mouth daily. 30 tablet 1   nicotine (NICODERM CQ - DOSED IN MG/24 HOURS) 21 mg/24hr patch Place 21 mg onto the skin daily. (Patient not taking:  Reported on 12/22/2020)     ondansetron (ZOFRAN) 8 MG tablet Take 1 tablet (8 mg total) by mouth every 8 (eight) hours as needed for nausea or vomiting. 20 tablet 1   prochlorperazine (COMPAZINE) 10 MG tablet Take 1 tablet (10 mg total) by mouth every 6 (six) hours as needed for nausea or vomiting. 30 tablet 2   No current facility-administered medications for this visit.   Facility-Administered Medications Ordered in Other Visits  Medication Dose Route Frequency Provider Last Rate Last Admin   sodium chloride flush (NS) 0.9 % injection 10 mL  10 mL Intracatheter PRN Ulysees Barns IV, MD   10 mL at 10/04/22 1622    REVIEW OF SYSTEMS:   Constitutional: ( - ) fevers, ( - )  chills , ( - ) night sweats Eyes: ( - ) blurriness of vision, ( - ) double vision, ( - ) watery eyes Ears, nose, mouth, throat, and face: ( - ) mucositis, ( - )  sore throat Respiratory: ( - ) cough, ( - ) dyspnea, ( - ) wheezes Cardiovascular: ( - ) palpitation, ( - ) chest discomfort, ( - ) lower extremity swelling Gastrointestinal:  ( + ) nausea, ( - ) heartburn, ( +) change in bowel habits Skin: ( - ) abnormal skin rashes Lymphatics: ( - ) new lymphadenopathy, ( - ) easy bruising Neurological: ( - ) numbness, ( - ) tingling, ( - ) new weaknesses Behavioral/Psych: ( - ) mood change, ( - ) new changes  All other systems were reviewed with the patient and are negative.  PHYSICAL EXAMINATION: ECOG PERFORMANCE STATUS: 1 - Symptomatic but completely ambulatory  There were no vitals filed for this visit.  There were no vitals filed for this visit.  Day 1 10/04/22  Weight 114 lb 4 oz (51.8 kg)  Temp 97.7 F (36.5 C)  Temp src Oral  Pulse 99  Resp 19  BP 151/91 (H)    GENERAL: Chronically ill-appearing middle-aged Caucasian female, alert, no distress and comfortable SKIN: skin color, texture, turgor are normal, no rashes or significant lesions EYES: conjunctiva are pink and non-injected, sclera clear LUNGS:  clear to auscultation and percussion with normal breathing effort HEART: regular rate & rhythm and no murmurs and no lower extremity edema Musculoskeletal: no cyanosis of digits and no clubbing  PSYCH: alert & oriented x 3, fluent speech NEURO: no focal motor/sensory deficits  LABORATORY DATA:  I have reviewed the data as listed    Latest Ref Rng & Units 10/04/2022   12:39 PM 08/23/2022   10:42 AM 08/02/2022   11:16 AM  CBC  WBC 4.0 - 10.5 K/uL 8.5  9.8  14.1   Hemoglobin 12.0 - 15.0 g/dL 40.9  81.1  91.4   Hematocrit 36.0 - 46.0 % 38.4  37.6  37.2   Platelets 150 - 400 K/uL 273  309  430        Latest Ref Rng & Units 10/04/2022   12:39 PM 08/23/2022   10:42 AM 08/02/2022   11:16 AM  CMP  Glucose 70 - 99 mg/dL 782  956  213   BUN 8 - 23 mg/dL 17  14  15    Creatinine 0.44 - 1.00 mg/dL 0.86  5.78  4.69   Sodium 135 - 145 mmol/L 141  136  138   Potassium 3.5 - 5.1 mmol/L 4.3  4.2  3.9   Chloride 98 - 111 mmol/L 108  102  102   CO2 22 - 32 mmol/L 26  28  29    Calcium 8.9 - 10.3 mg/dL 9.2  9.2  9.1   Total Protein 6.5 - 8.1 g/dL 7.1  6.9  7.1   Total Bilirubin 0.3 - 1.2 mg/dL 0.3  0.2  0.3   Alkaline Phos 38 - 126 U/L 109  86  102   AST 15 - 41 U/L 15  15  17    ALT 0 - 44 U/L 8  7  11      RADIOGRAPHIC STUDIES: CT CHEST ABDOMEN PELVIS W CONTRAST  Result Date: 09/11/2022 CLINICAL DATA:  Metastatic non-small cell lung cancer restaging, assess treatment response * Tracking Code: BO * EXAM: CT CHEST, ABDOMEN, AND PELVIS WITH CONTRAST TECHNIQUE: Multidetector CT imaging of the chest, abdomen and pelvis was performed following the standard protocol during bolus administration of intravenous contrast. RADIATION DOSE REDUCTION: This exam was performed according to the departmental dose-optimization program which includes automated exposure control, adjustment of the mA and/or  kV according to patient size and/or use of iterative reconstruction technique. CONTRAST:  OMNIPAQUE IOHEXOL 300  MG/ML  SOLN COMPARISON:  05/26/2022 FINDINGS: CT CHEST FINDINGS Cardiovascular: Right chest port catheter. Aortic atherosclerosis. Normal heart size. No pericardial effusion. Mediastinum/Nodes: No enlarged mediastinal, hilar, or axillary lymph nodes. Thyroid gland, trachea, and esophagus demonstrate no significant findings. Lungs/Pleura: Large mass of the posterior right upper lobe is stable or at most slightly enlarged, measuring 6.1 x 4.2 cm, previously 5.5 x 4.4 cm (series 2, image 34). Severe underlying centrilobular and paraseptal emphysema. Trace, loculated right pleural effusion. Musculoskeletal: No chest wall abnormality. No acute osseous findings. CT ABDOMEN PELVIS FINDINGS Hepatobiliary: No solid liver abnormality is seen. No gallstones, gallbladder wall thickening, or biliary dilatation. Pancreas: Unremarkable. No pancreatic ductal dilatation or surrounding inflammatory changes. Spleen: Normal in size without significant abnormality. Adrenals/Urinary Tract: Interval enlargement of a right adrenal nodule measuring 2.4 x 1.8 cm, previously 0.6 x 1.4 cm (series 2, image 62). Unchanged left adrenal nodule measuring 2.0 x 1.7 cm (series 2, image 65). Kidneys are normal, without renal calculi, solid lesion, or hydronephrosis. Status post cystectomy and right lower quadrant ileal conduit urinary diversion Stomach/Bowel: Stomach is within normal limits. Appendix appears normal. Mid small bowel resection and reanastomosis for ileal conduit urinary diversion no evidence of bowel wall thickening, distention, or inflammatory changes. Sigmoid diverticulosis. Vascular/Lymphatic: Aortic atherosclerosis. 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 New enlargement of matted retroperitoneal lymph nodes, largest left retroperitoneal nodes measuring up to 1.2 x 1.1 cm (series 2, image 76). Reproductive: Status post hysterectomy. Other: Midline ventral hernia  containing nonobstructed loops of small bowel and colon. Right lower quadrant ileal conduit urinary diversion. No ascites. Musculoskeletal: No acute osseous findings. Significant reduction in size of a very bulky soft tissue mass associated with a large lytic lesion of the right ilium, measuring 8.4 x 3.4 cm, previously 10.1 x 8.2 cm (series 2, image 89). IMPRESSION: 1. Large mass of the posterior right upper lobe is stable or at most slightly enlarged. 2. Interval enlargement of a right adrenal nodule. Unchanged left adrenal nodule. 3. New enlargement of matted retroperitoneal lymph nodes. 4. There is however significant reduction in size of a very bulky soft tissue mass associated with a large lytic lesion of the right ilium. 5. Constellation of findings is consistent with mixed response to treatment. 6. Status post cystectomy and right lower quadrant ileal conduit urinary diversion. 7. Emphysema. Aortic Atherosclerosis (ICD10-I70.0) and Emphysema (ICD10-J43.9). Electronically Signed   By: Jearld Lesch M.D.   On: 09/11/2022 21:21    ASSESSMENT & PLAN Lauren Salazar 62 y.o. female with medical history significant for metastatic adenosquamous lung cancer presents for a follow up visit.  She is status post robotic assisted laparoscopic cystectomy and pelvic exoneration including hysterectomy, bilateral salpingo-oophorectomy and ileoconduit diversion completed on February 01, 2019.  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   Radiation therapy with weekly chemotherapy utilizing carboplatin and paclitaxel.  Week 1 of chemotherapy started on May 14, 2019.  Therapy concluded in February 2021.   Durvalumab 10 mg/kg cycle 1 on Sep 03, 2019 every 14 days.  She completed 4 cycles of therapy.   Carboplatin with Alimta and Pembrolizumab started on August 04, 2020.  She completed 3 cycles of therapy in June 2022.   Pembrolizumab 400 mg every 6 weeks started on December 22, 2020.  Therapy discontinued and  March 2023 due to patient's preference.  Progression noted on CT scan from 05/26/2022.   Completed palliative radiation to her R hip/pelvis on 07/01/2022. Received 30 Gy in 10 Fx.   Started Docetaxel and Ramucirumab on 07/12/2022.  # Metastatic Adenosquamous Carcinoma of the Lung  --patient has undergone numerous lines of treatment including chemoradiation followed by immunotherapy, subsequent carbo/pem/pem and then monotherapy pembrolizumab --progression of disease noted on 05/26/2022 CT scan. --Started docetaxel plus ramucirumab on 07/12/2022 --Due to poor tolerance with Cycle 1, dose reduced Doxcetaxel from 75 mg/m2 to 60 mg/m2 starting Cycle 2.  Plan: -- today is Cycle 4 Day 1 of Docetaxel/Ramucircumab -- labs today show white blood cell 8.5, hemoglobin 12.2, MCV 94.1, and platelets of 273. Creatinine and LFTs normal.   --Review most recent CT scan from 09/09/2022 which showed mixed response. Dr. Leonides Schanz recommend to continue due to recent gap in treatment and get a short term CT scan in 6-8 weeks.  --RTC in 3 weeks with labs, follow up before Cycle 5, Day 1 Docetaxel/Ramucircumab.  # Abdominal Pain/ Cancer Related Pain --Prescribed oxycodone 5 mg as needed, however patient avoids taking this as it causes her to be "loopy". --Patient completed palliative radiation with radiation oncology to right hip/pelvis with improvement of pain --Patient is c/o right sided mid back pain/hip pain. Currently take Norco 5-325 mg nightly to help her sleep but pain relief is short term.  --Sent her long acting pain medication with Xtampza 13.5 mg q 12 hours.   #Nausea: --Added IV emend to her pre-meds --Continue to take zofran and compazine as needed.   #Diarrhea: --Secondary to chemotherapy --Continue to take imodium as needed.   #Joint pain 2/2 GCSF injection: --Recommend to take Claritin for several days with injection to minimize side effects.    Orders Placed This  Encounter  Procedures   CBC with Differential (Cancer Center Only)    Standing Status:   Future    Standing Expiration Date:   11/15/2023   CMP (Cancer Center only)    Standing Status:   Future    Standing Expiration Date:   11/15/2023   T4    Standing Status:   Future    Standing Expiration Date:   11/15/2023   TSH    Standing Status:   Future    Standing Expiration Date:   11/15/2023   Total Protein, Urine dipstick    Standing Status:   Future    Standing Expiration Date:   11/15/2023    All questions were answered. The patient knows to call the clinic with any problems, questions or concerns.  A total of more than 30 minutes were spent on this encounter with face-to-face time and non-face-to-face time, including preparing to see the patient, ordering tests and/or medications, counseling the patient and coordination of care as outlined above.   Georga Kaufmann PA-C Dept of Hematology and Oncology [facility] Phone: [phone]   10/04/2022 4:31 PM

## 2022-10-05 ENCOUNTER — Telehealth: Payer: Self-pay

## 2022-10-05 NOTE — Telephone Encounter (Signed)
Notified Patient of prior authorization approval for Lidocaine Patches. Medication is approved through 10/05/2023. No other needs or concerns voiced at this time.

## 2022-10-06 ENCOUNTER — Ambulatory Visit: Payer: Medicaid Other

## 2022-10-06 ENCOUNTER — Inpatient Hospital Stay: Payer: Medicaid Other

## 2022-10-11 ENCOUNTER — Encounter (HOSPITAL_BASED_OUTPATIENT_CLINIC_OR_DEPARTMENT_OTHER): Payer: Medicaid Other | Attending: General Surgery | Admitting: General Surgery

## 2022-10-11 DIAGNOSIS — L89313 Pressure ulcer of right buttock, stage 3: Secondary | ICD-10-CM | POA: Diagnosis present

## 2022-10-11 DIAGNOSIS — I700 Atherosclerosis of aorta: Secondary | ICD-10-CM | POA: Diagnosis not present

## 2022-10-11 DIAGNOSIS — Z8551 Personal history of malignant neoplasm of bladder: Secondary | ICD-10-CM | POA: Diagnosis not present

## 2022-10-11 DIAGNOSIS — J449 Chronic obstructive pulmonary disease, unspecified: Secondary | ICD-10-CM | POA: Insufficient documentation

## 2022-10-11 DIAGNOSIS — F17210 Nicotine dependence, cigarettes, uncomplicated: Secondary | ICD-10-CM | POA: Diagnosis not present

## 2022-10-11 DIAGNOSIS — C3431 Malignant neoplasm of lower lobe, right bronchus or lung: Secondary | ICD-10-CM | POA: Diagnosis not present

## 2022-10-11 DIAGNOSIS — I10 Essential (primary) hypertension: Secondary | ICD-10-CM | POA: Insufficient documentation

## 2022-10-11 DIAGNOSIS — Z833 Family history of diabetes mellitus: Secondary | ICD-10-CM | POA: Insufficient documentation

## 2022-10-11 NOTE — Progress Notes (Signed)
ROSEMOND, MARSH Salazar ([id number]) [id number] Page 1 of 8 Visit Report for 10/11/2022 Arrival Information Details Patient Name: Date of Service: Lauren Salazar, Lauren Salazar 10/11/2022 2:15 PM Medical Record Number: [id number] Patient Account Number: [id number] Date of Birth/Sex: Treating RN: 27-Oct-1960 (63 y.o. Fredderick Phenix Primary Care Staley Budzinski: Hale Bogus, EA GLE Other Clinician: Referring Donatello Kleve: Treating Mkayla Steele/Extender: Rodney Langton, EA GLE Weeks in Treatment: 3 Visit Information History Since Last Visit Added or deleted any medications: No Patient Arrived: Dan Humphreys Any new allergies or adverse reactions: No Arrival Time: 14:25 Had a fall or experienced change in No Accompanied By: self activities of daily living that may affect Transfer Assistance: None risk of falls: Patient Identification Verified: Yes Signs or symptoms of abuse/neglect since last visito No Secondary Verification Process Completed: Yes Hospitalized since last visit: No Implantable device outside of the clinic excluding No cellular tissue based products placed in the center since last visit: Has Dressing in Place as Prescribed: Yes Pain Present Now: Yes Electronic Signature(s) Signed: 10/11/2022 4:05:21 PM By: Samuella Bruin Entered By: Samuella Bruin on 10/11/2022 14:29:37 -------------------------------------------------------------------------------- Clinic Level of Care Assessment Details Patient Name: Date of Service: CHENA, MELLENTHIN 10/11/2022 2:15 PM Medical Record Number: [id number] Patient Account Number: [id number] Date of Birth/Sex: Treating RN: 1961-01-30 (62 y.o. Fredderick Phenix Primary Care Lenvil Swaim: Hale Bogus, EA GLE Other Clinician: Referring Kahla Risdon: Treating Oluwaseyi Tull/Extender: Rodney Langton, EA GLE Weeks in Treatment: 3 Clinic Level of Care Assessment Items TOOL 4 Quantity Score X- 1 0 Use when only an  EandM is performed on FOLLOW-UP visit ASSESSMENTS - Nursing Assessment / Reassessment []  - 0 Reassessment of Co-morbidities (includes updates in patient status) X- 1 5 Reassessment of Adherence to Treatment Plan ASSESSMENTS - Wound and Skin A ssessment / Reassessment X - Simple Wound Assessment / Reassessment - one wound 1 5 []  - 0 Complex Wound Assessment / Reassessment - multiple wounds []  - 0 Dermatologic / Skin Assessment (not related to wound area) ASSESSMENTS - Focused Assessment []  - 0 Circumferential Edema Measurements - multi extremities []  - 0 Nutritional Assessment / Counseling / Intervention Lauren Salazar, Lauren Salazar ([id number]) [id number] Page 2 of 8 []  - 0 Lower Extremity Assessment (monofilament, tuning fork, pulses) []  - 0 Peripheral Arterial Disease Assessment (using hand held doppler) ASSESSMENTS - Ostomy and/or Continence Assessment and Care []  - 0 Incontinence Assessment and Management []  - 0 Ostomy Care Assessment and Management (repouching, etc.) PROCESS - Coordination of Care X - Simple Patient / Family Education for ongoing care 1 15 []  - 0 Complex (extensive) Patient / Family Education for ongoing care X- 1 10 Staff obtains Chiropractor, Records, T Results / Process Orders est []  - 0 Staff telephones HHA, Nursing Homes / Clarify orders / etc []  - 0 Routine Transfer to another Facility (non-emergent condition) []  - 0 Routine Hospital Admission (non-emergent condition) []  - 0 New Admissions / Manufacturing engineer / Ordering NPWT Apligraf, etc. , []  - 0 Emergency Hospital Admission (emergent condition) X- 1 10 Simple Discharge Coordination []  - 0 Complex (extensive) Discharge Coordination PROCESS - Special Needs []  - 0 Pediatric / Minor Patient Management []  - 0 Isolation Patient Management []  - 0 Hearing / Language / Visual special needs []  - 0 Assessment of Community assistance (transportation, Salazar/C planning,  etc.) []  - 0 Additional assistance / Altered mentation []  - 0 Support Surface(s) Assessment (bed, cushion, seat, etc.) INTERVENTIONS - Wound Cleansing / Measurement []  - 0 Simple Wound Cleansing -  one wound []  - 0 Complex Wound Cleansing - multiple wounds X- 1 5 Wound Imaging (photographs - any number of wounds) []  - 0 Wound Tracing (instead of photographs) []  - 0 Simple Wound Measurement - one wound []  - 0 Complex Wound Measurement - multiple wounds INTERVENTIONS - Wound Dressings []  - 0 Small Wound Dressing one or multiple wounds []  - 0 Medium Wound Dressing one or multiple wounds []  - 0 Large Wound Dressing one or multiple wounds []  - 0 Application of Medications - topical []  - 0 Application of Medications - injection INTERVENTIONS - Miscellaneous []  - 0 External ear exam []  - 0 Specimen Collection (cultures, biopsies, blood, body fluids, etc.) []  - 0 Specimen(s) / Culture(s) sent or taken to Lab for analysis []  - 0 Patient Transfer (multiple staff / Nurse, adult / Similar devices) []  - 0 Simple Staple / Suture removal (25 or less) []  - 0 Complex Staple / Suture removal (26 or more) []  - 0 Hypo / Hyperglycemic Management (close monitor of Blood Glucose) Lauren Salazar, Lauren Salazar Salazar ([id number]) [id number] Page 3 of 8 []  - 0 Ankle / Brachial Index (ABI) - do not check if billed separately X- 1 5 Vital Signs Has the patient been seen at the hospital within the last three years: Yes Total Score: 55 Level Of Care: New/Established - Level 2 Electronic Signature(s) Signed: 10/11/2022 4:05:21 PM By: Samuella Bruin Entered By: Samuella Bruin on 10/11/2022 14:42:43 -------------------------------------------------------------------------------- Encounter Discharge Information Details Patient Name: Date of Service: Lauren Flattery Salazar. 10/11/2022 2:15 PM Medical Record Number: [id number] Patient Account Number: [id number] Date of  Birth/Sex: Treating RN: 1960-04-26 (62 y.o. Fredderick Phenix Primary Care Corena Tilson: Hale Bogus, EA GLE Other Clinician: Referring Ricketta Colantonio: Treating Ayana Imhof/Extender: Rodney Langton, EA GLE Weeks in Treatment: 3 Encounter Discharge Information Items Discharge Condition: Stable Ambulatory Status: Walker Discharge Destination: Home Transportation: Private Auto Accompanied By: self Schedule Follow-up Appointment: No Clinical Summary of Care: Patient Declined Electronic Signature(s) Signed: 10/11/2022 4:05:21 PM By: Samuella Bruin Entered By: Samuella Bruin on 10/11/2022 14:43:12 -------------------------------------------------------------------------------- Lower Extremity Assessment Details Patient Name: Date of Service: Lauren Flattery Salazar. 10/11/2022 2:15 PM Medical Record Number: [id number] Patient Account Number: [id number] Date of Birth/Sex: Treating RN: [birth year] (62 y.o. Fredderick Phenix Primary Care Jowanna Loeffler: Hale Bogus, [place] GLE Other Clinician: Referring Anahi Belmar: Treating Devaun Hernandez/Extender: Rodney Langton, EA GLE Weeks in Treatment: 3 Electronic Signature(s) Signed: 10/11/2022 4:05:21 PM By: Samuella Bruin Entered By: Samuella Bruin on 10/11/2022 14:30:04 -------------------------------------------------------------------------------- Multi Wound Chart Details Patient Name: Date of Service: Lauren Flattery Salazar. 10/11/2022 2:15 PM Medical Record Number: [id number] Patient Account Number: [id number] Lauren Salazar, Lauren Salazar ([id number]) [id number] Page 4 of 8 Date of Birth/Sex: Treating RN: 1960-07-03 (62 y.o. F) Primary Care Lucinda Spells: CO LLEGE, EA GLE Other Clinician: Referring Hetal Proano: Treating Keysi Oelkers/Extender: Duanne Guess CO LLEGE, EA GLE Weeks in Treatment: 3 Vital Signs Height(in): 66 Pulse(bpm): 118 Weight(lbs): 117 Blood Pressure(mmHg): 145/90 Body Mass Index(BMI):  18.9 Temperature(F): 97.9 Respiratory Rate(breaths/min): 16 [1:Photos:] [N/A:N/A] Right Ischium N/A N/A Wound Location: Pressure Injury N/A N/A Wounding Event: Pressure Ulcer N/A N/A Primary Etiology: Chronic Obstructive Pulmonary N/A N/A Comorbid History: Disease (COPD), Received Chemotherapy 07/25/2022 N/A N/A Date Acquired: 3 N/A N/A Weeks of Treatment: Open N/A N/A Wound Status: No N/A N/A Wound Recurrence: 0x0x0 N/A N/A Measurements L x W x Salazar (cm) 0 N/A N/A A (cm) : rea 0 N/A N/A Volume (cm) : 100.00% N/A N/A % Reduction in A rea:  100.00% N/A N/A % Reduction in Volume: Category/Stage II N/A N/A Classification: None Present N/A N/A Exudate A mount: Distinct, outline attached N/A N/A Wound Margin: None Present (0%) N/A N/A Granulation A mount: None Present (0%) N/A N/A Necrotic A mount: Fascia: No N/A N/A Exposed Structures: Fat Layer (Subcutaneous Tissue): No Tendon: No Muscle: No Joint: No Bone: No Large (67-100%) N/A N/A Epithelialization: No Abnormalities Noted N/A N/A Periwound Skin Texture: No Abnormalities Noted N/A N/A Periwound Skin Moisture: No Abnormalities Noted N/A N/A Periwound Skin Color: No Abnormality N/A N/A Temperature: Treatment Notes Wound #1 (Ischium) Wound Laterality: Right Cleanser Peri-Wound Care Topical Primary Dressing Secondary Dressing Secured With Compression Wrap Compression Stockings Add-Ons Electronic Signature(s) Signed: 10/11/2022 2:52:12 PM By: Duanne Guess MD FACS Blanck, JACQUELINE2:52:12 PM By: Duanne Guess MD FACS Signed: 10/11/2022 Salazar ([id number]) [id number] Page 5 of 8 Entered By: Duanne Guess on 10/11/2022 14:52:11 -------------------------------------------------------------------------------- Multi-Disciplinary Care Plan Details Patient Name: Date of Service: Lauren Salazar, Lauren Salazar 10/11/2022 2:15 PM Medical Record Number: [id number] Patient Account  Number: [id number] Date of Birth/Sex: Treating RN: May 03, 1960 (62 y.o. Fredderick Phenix Primary Care Adrianna Dudas: Hale Bogus, [place] GLE Other Clinician: Referring Jonuel Butterfield: Treating Johnsie Moscoso/Extender: Rodney Langton, EA GLE Weeks in Treatment: 3 Active Inactive Electronic Signature(s) Signed: 10/11/2022 4:05:21 PM By: Samuella Bruin Entered By: Samuella Bruin on 10/11/2022 14:43:31 -------------------------------------------------------------------------------- Pain Assessment Details Patient Name: Date of Service: Lauren Salazar, Lauren Salazar. 10/11/2022 2:15 PM Medical Record Number: [id number] Patient Account Number: [id number] Date of Birth/Sex: Treating RN: [birth year] (62 y.o. Fredderick Phenix Primary Care Hatice Bubel: Gabriel Rung GLE Other Clinician: Referring Suhaan Perleberg: Treating Hortence Charter/Extender: Rodney Langton, EA GLE Weeks in Treatment: 3 Active Problems Location of Pain Severity and Description of Pain Patient Has Paino Yes Site Locations Pain Location: Generalized Pain, Pain in Ulcers Duration of the Pain. Constant / Intermittento Intermittent Rate the pain. Current Pain Level: 8 Character of Pain Describe the Pain: Aching Pain Management and Medication Current Pain Management: MARINE, LOJEK ([id number]) [phone].pdf Page 6 of 8 Electronic Signature(s) Signed: 10/11/2022 4:05:21 PM By: Gelene Mink By: Samuella Bruin on 10/11/2022 14:30:00 -------------------------------------------------------------------------------- Patient/Caregiver Education Details Patient Name: Date of Service: Reather Converse 6/18/2024andnbsp2:15 PM Medical Record Number: [id number] Patient Account Number: [id number] Date of Birth/Gender: Treating RN: 05-08-1960 (62 y.o. Fredderick Phenix Primary Care Physician: Hale Bogus, EA GLE Other Clinician: Referring Physician: Treating Physician/Extender:  Rodney Langton, EA GLE Weeks in Treatment: 3 Education Assessment Education Provided To: Patient Education Topics Provided Wound/Skin Impairment: Methods: Explain/Verbal Responses: Reinforcements needed, State content correctly Electronic Signature(s) Signed: 10/11/2022 4:05:21 PM By: Samuella Bruin Entered By: Samuella Bruin on 10/11/2022 14:35:38 -------------------------------------------------------------------------------- Wound Assessment Details Patient Name: Date of Service: Lauren Flattery Salazar. 10/11/2022 2:15 PM Medical Record Number: [id number] Patient Account Number: [id number] Date of Birth/Sex: Treating RN: 06/05/1960 (62 y.o. Fredderick Phenix Primary Care Fredericka Bottcher: Hale Bogus, EA GLE Other Clinician: Referring Hena Ewalt: Treating Lyndia Bury/Extender: Duanne Guess CO LLEGE, EA GLE Weeks in Treatment: 3 Wound Status Wound Number: 1 Primary Pressure Ulcer Etiology: Wound Location: Right Ischium Wound Status: Open Wounding Event: Pressure Injury Comorbid Chronic Obstructive Pulmonary Disease (COPD), Received Date Acquired: 07/25/2022 History: Chemotherapy Weeks Of Treatment: 3 Clustered Wound: No Photos Lauren Salazar, Lauren Salazar ([id number]) [id number] Page 7 of 8 Wound Measurements Length: (cm) Width: (cm) Depth: (cm) Area: (cm) Volume: (cm) 0 % Reduction in Area: 100% 0 % Reduction in Volume: 100% 0 Epithelialization: Large (67-100%) 0 Tunneling:  No 0 Undermining: No Wound Description Classification: Category/Stage II Wound Margin: Distinct, outline attached Exudate Amount: None Present Foul Odor After Cleansing: No Slough/Fibrino No Wound Bed Granulation Amount: None Present (0%) Exposed Structure Necrotic Amount: None Present (0%) Fascia Exposed: No Fat Layer (Subcutaneous Tissue) Exposed: No Tendon Exposed: No Muscle Exposed: No Joint Exposed: No Bone Exposed: No Periwound Skin  Texture Texture Color No Abnormalities Noted: Yes No Abnormalities Noted: Yes Moisture Temperature / Pain No Abnormalities Noted: Yes Temperature: No Abnormality Electronic Signature(s) Signed: 10/11/2022 4:05:21 PM By: Samuella Bruin Signed: 10/11/2022 4:38:14 PM By: Karie Schwalbe RN Entered By: Karie Schwalbe on 10/11/2022 14:33:07 -------------------------------------------------------------------------------- Vitals Details Patient Name: Date of Service: Lauren Flattery Salazar. 10/11/2022 2:15 PM Medical Record Number: [id number] Patient Account Number: [id number] Date of Birth/Sex: Treating RN: 1960/08/29 (62 y.o. Fredderick Phenix Primary Care Ashelyn Mccravy: Hale Bogus, EA GLE Other Clinician: Referring Won Kreuzer: Treating Odarius Dines/Extender: Duanne Guess CO LLEGE, EA GLE Weeks in Treatment: 3 Vital Signs Time Taken: 14:28 Temperature (F): 97.9 Height (in): 66 Pulse (bpm): 118 Weight (lbs): 117 Respiratory Rate (breaths/min): 16 Body Mass Index (BMI): 18.9 Blood Pressure (mmHg): 145/90 Reference Range: 80 - 120 mg / dl Electronic Signature(s) Signed: 10/11/2022 4:05:21 PM By: Kandra Nicolas Salazar ([id number]) [id number] Page 8 of 8 Entered By: Samuella Bruin on 10/11/2022 14:29:28

## 2022-10-11 NOTE — Progress Notes (Signed)
PARISH, DURIE ([id number]) [id number] Page 1 of 6 Visit Report for 10/11/2022 Chief Complaint Document Details Patient Name: Date of Service: Lauren Salazar, Lauren Salazar 10/11/2022 2:15 PM Medical Record Number: [id number] Patient Account Number: [id number] Date of Birth/Sex: Treating RN: 1960-10-13 (62 y.o. F) Primary Care Provider: Hale Bogus, EA GLE Other Clinician: Referring Provider: Treating Provider/Extender: Rodney Langton, EA GLE Weeks in Treatment: 3 Information Obtained from: Patient Chief Complaint Patient is at the clinic for treatment of an open pressure ulcer Electronic Signature(s) Signed: 10/11/2022 2:53:29 PM By: Duanne Guess MD FACS Entered By: Duanne Guess on 10/11/2022 14:53:29 -------------------------------------------------------------------------------- HPI Details Patient Name: Date of Service: Lauren Flattery Salazar. 10/11/2022 2:15 PM Medical Record Number: [id number] Patient Account Number: [id number] Date of Birth/Sex: Treating RN: 1961/03/01 (62 y.o. F) Primary Care Provider: Hale Bogus, EA GLE Other Clinician: Referring Provider: Treating Provider/Extender: Rodney Langton, EA GLE Weeks in Treatment: 3 History of Present Illness HPI Description: ADMISSION 09/20/2022 This is a 62 year old woman with metastatic adenosquamous carcinoma of the lung and history of bladder cancer status post pelvic exenteration and ileal conduit formation. She is currently receiving palliative chemotherapy with docetaxel and ramucirumab with udyenca. At an oncology visit on 30 April, she was noted to have a stage II pressure ulcer at the gluteal calf and was referred to wound care for further evaluation and management. She was prescribed antibiotic therapy due to her immunocompromised status. She is no longer taking antibiotics. According to the electronic medical record, she was advised to offload the area. Of note,  she does continue to smoke. She smokes about 1 pack of cigarettes per day. She is being followed by palliative care. She has been applying Neosporin and an adhesive bandage. In addition, due to remaining primarily on her left side, she is starting to develop a pressure ulcer on her ear. Currently this is limited to stage I without any actual skin breakdown. 10/11/2022: Her wound is healed. Electronic Signature(s) Signed: 10/11/2022 2:53:55 PM By: Duanne Guess MD FACS Entered By: Duanne Guess on 10/11/2022 14:53:55 Physical Exam Details -------------------------------------------------------------------------------- Lauren Salazar ([id number]) [id number] Page 2 of 6 Patient Name: Date of Service: Lauren Salazar, Lauren Salazar 10/11/2022 2:15 PM Medical Record Number: [id number] Patient Account Number: [id number] Date of Birth/Sex: Treating RN: [birth year] (62 y.o. F) Primary Care Provider: Hale Bogus, EA GLE Other Clinician: Referring Provider: Treating Provider/Extender: Duanne Guess CO LLEGE, EA GLE Weeks in Treatment: 3 Constitutional Slightly hypertensive. Tachycardic, asymptomatic. . . no acute distress. Respiratory Normal work of breathing on room air. Notes Her wound is healed. Electronic Signature(s) Signed: 10/11/2022 2:54:45 PM By: Duanne Guess MD FACS Entered By: Duanne Guess on 10/11/2022 14:54:44 -------------------------------------------------------------------------------- Physician Orders Details Patient Name: Date of Service: Lauren Flattery Salazar. 10/11/2022 2:15 PM Medical Record Number: [id number] Patient Account Number: [id number] Date of Birth/Sex: Treating RN: 04-22-1961 (62 y.o. Fredderick Phenix Fredderick Phenix Primary Care Provider: Hale Bogus, EA GLE Other Clinician: Referring Provider: Treating Provider/Extender: Rodney Langton, EA GLE Weeks in Treatment: 3 Verbal / Phone Orders: No Diagnosis Coding ICD-10  Coding Code Description L89.313 Pressure ulcer of right buttock, stage 3 C34.31 Malignant neoplasm of lower lobe, right bronchus or lung C67.9 Malignant neoplasm of bladder, unspecified I70.0 Atherosclerosis of aorta G89.29 Other chronic pain Z72.0 Tobacco use Discharge From [facility] Services Discharge from Wound Care Center - Congratulations!!!! Off-Loading Turn and reposition every 2 hours - purchase egg crate foam or memory foam and cut a hole  where your wound will be to distribute pressure Additional Orders / Instructions Follow Nutritious Diet - protein shakes Electronic Signature(s) Signed: 10/11/2022 2:59:01 PM By: Duanne Guess MD FACS Entered By: Duanne Guess on 10/11/2022 14:55:17 Problem List Details -------------------------------------------------------------------------------- Lauren Salazar ([id number]) [id number] Page 3 of 6 Patient Name: Date of Service: Lauren Salazar, Lauren Salazar 10/11/2022 2:15 PM Medical Record Number: [id number] Patient Account Number: [id number] Date of Birth/Sex: Treating RN: Sep 27, 1960 (62 y.o. F) Primary Care Provider: Hale Bogus, EA GLE Other Clinician: Referring Provider: Treating Provider/Extender: Rodney Langton, EA GLE Weeks in Treatment: 3 Active Problems ICD-10 Encounter Code Description Active Date MDM Diagnosis L89.313 Pressure ulcer of right buttock, stage 3 09/20/2022 No Yes C34.31 Malignant neoplasm of lower lobe, right bronchus or lung 09/20/2022 No Yes C67.9 Malignant neoplasm of bladder, unspecified 09/20/2022 No Yes I70.0 Atherosclerosis of aorta 09/20/2022 No Yes G89.29 Other chronic pain 09/20/2022 No Yes Z72.0 Tobacco use 09/20/2022 No Yes Inactive Problems Resolved Problems Electronic Signature(s) Signed: 10/11/2022 2:51:24 PM By: Duanne Guess MD FACS Entered By: Duanne Guess on 10/11/2022  14:51:24 -------------------------------------------------------------------------------- Progress Note Details Patient Name: Date of Service: Lauren Flattery Salazar. 10/11/2022 2:15 PM Medical Record Number: [id number] Patient Account Number: [id number] Date of Birth/Sex: Treating RN: 12/29/1960 (62 y.o. F) Primary Care Provider: Hale Bogus, EA GLE Other Clinician: Referring Provider: Treating Provider/Extender: Rodney Langton, EA GLE Weeks in Treatment: 3 Subjective Chief Complaint Information obtained from Patient Patient is at the clinic for treatment of an open pressure ulcer History of Present Illness (HPI) ADMISSION 09/20/2022 This is a 62 year old woman with metastatic adenosquamous carcinoma of the lung and history of bladder cancer status post pelvic exenteration and ileal conduit formation. She is currently receiving palliative chemotherapy with docetaxel and ramucirumab with udyenca. At an oncology visit on 30 April, she was noted to have a stage II pressure ulcer at the gluteal calf and was referred to wound care for further evaluation and management. She was prescribed antibiotic Lauren Salazar, Lauren Salazar ([id number]) [phone].pdf Page 4 of 6 therapy due to her immunocompromised status. She is no longer taking antibiotics. According to the electronic medical record, she was advised to offload the area. Of note, she does continue to smoke. She smokes about 1 pack of cigarettes per day. She is being followed by palliative care. She has been applying Neosporin and an adhesive bandage. In addition, due to remaining primarily on her left side, she is starting to develop a pressure ulcer on her ear. Currently this is limited to stage I without any actual skin breakdown. 10/11/2022: Her wound is healed. Patient History Information obtained from Patient. Family History Cancer - Mother,Father, Diabetes - Father, Thyroid Problems - Mother, No family  history of Heart Disease, Hereditary Spherocytosis, Hypertension, Kidney Disease, Lung Disease, Seizures, Stroke, Tuberculosis. Social History Current every day smoker - 1ppd, Marital Status - Divorced, Alcohol Use - Never, Drug Use - No History, Caffeine Use - Daily. Medical History Respiratory Patient has history of Chronic Obstructive Pulmonary Disease (COPD) Endocrine Denies history of Type I Diabetes, Type II Diabetes Oncologic Patient has history of Received Chemotherapy Hospitalization/Surgery History - Ir imaging guided port insertion. - Robot assisted laparoscopic complete cystect ileal conduit. - Lymphadenectomy bilateral. - Cystoscopy with injection. - Transurethral resection of bladder tumor. - Cervical fusion. 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 Tachycardic, asymptomatic. no acute distress. Vitals Time Taken: 2:28 PM, Height:  66 in, Weight: 117 lbs, BMI: 18.9, Temperature: 97.9 F, Pulse: 118 bpm, Respiratory Rate: 16 breaths/min, Blood Pressure: 145/90 mmHg. Respiratory Normal work of breathing on room air. General Notes: Her wound is healed. Integumentary (Hair, Skin) Wound #1 status is Open. Original cause of wound was Pressure Injury. The date acquired was: 07/25/2022. The wound has been in treatment 3 weeks. The wound is located on the Right Ischium. The wound measures 0cm length x 0cm width x 0cm depth; 0cm^2 area and 0cm^3 volume. There is no tunneling or undermining noted. There is a none present amount of drainage noted. The wound margin is distinct with the outline attached to the wound base. There is no granulation within the wound bed. There is no necrotic tissue within the wound bed. The periwound skin appearance had no abnormalities noted for texture. The periwound skin appearance had no abnormalities noted for moisture. The  periwound skin appearance had no abnormalities noted for color. Periwound temperature was noted as No Abnormality. Assessment Active Problems ICD-10 Pressure ulcer of right buttock, stage 3 Malignant neoplasm of lower lobe, right bronchus or lung Malignant neoplasm of bladder, unspecified Atherosclerosis of aorta Other chronic pain Tobacco use Lauren Salazar, Lauren Salazar ([id number]) [id number] Page 5 of 6 Plan Discharge From [facility] Services: Discharge from Wound Care Center - Congratulations!!!! Off-Loading: Turn and reposition every 2 hours - purchase egg crate foam or memory foam and cut a hole where your wound will be to distribute pressure Additional Orders / Instructions: Follow Nutritious Diet - protein shakes 10/11/2022: Her wound is healed. She will need to continue her offloading efforts to ensure that she does not sustain further pressure injury. She should take similar precautions for her ear. We will discharge her from the wound care center. She may follow-up as needed. Electronic Signature(s) Signed: 10/11/2022 2:56:04 PM By: Duanne Guess MD FACS Entered By: Duanne Guess on 10/11/2022 14:56:04 -------------------------------------------------------------------------------- HxROS Details Patient Name: Date of Service: Lauren Flattery Salazar. 10/11/2022 2:15 PM Medical Record Number: [id number] Patient Account Number: [id number] Date of Birth/Sex: Treating RN: [birth year] (62 y.o. F) Primary Care Provider: Hale Bogus, EA GLE Other Clinician: Referring Provider: Treating Provider/Extender: Rodney Langton, EA GLE Weeks in Treatment: 3 Information Obtained From Patient Respiratory Medical History: Positive for: Chronic Obstructive Pulmonary Disease (COPD) Cardiovascular Medical History: Past Medical History Notes: hardening of the aorta Endocrine Medical History: Negative for: Type I Diabetes; Type II  Diabetes Neurologic Medical History: Past Medical History Notes: sciatica Oncologic Medical History: Positive for: Received Chemotherapy Past Medical History Notes: bladder cancer Immunizations Pneumococcal Vaccine: Received Pneumococcal Vaccination: No Implantable Devices None Hospitalization / Surgery History Lauren Salazar, Lauren Salazar ([id number]) [id number] Page 6 of 6 Type of Hospitalization/Surgery Ir imaging guided port insertion Robot assisted laparoscopic complete cystect ileal conduit Lymphadenectomy bilateral Cystoscopy with injection Transurethral resection of bladder tumor Cervical fusion Family and Social History Cancer: Yes - Mother,Father; Diabetes: Yes - Father; Heart Disease: No; Hereditary Spherocytosis: No; Hypertension: No; Kidney Disease: No; Lung Disease: No; Seizures: No; Stroke: No; Thyroid Problems: Yes - Mother; Tuberculosis: No; Current every day smoker - 1ppd; Marital Status - Divorced; Alcohol Use: Never; Drug Use: No History; Caffeine Use: Daily; Financial Concerns: No; Food, Clothing or Shelter Needs: No; Support System Lacking: No; Transportation Concerns: No Electronic Signature(s) Signed: 10/11/2022 2:59:01 PM By: Duanne Guess MD FACS Entered By: Duanne Guess on 10/11/2022 14:54:04 -------------------------------------------------------------------------------- SuperBill Details Patient Name: Date of Service: Lauren Flattery Salazar. 10/11/2022 Medical Record Number: [id number] Patient Account  Number: [id number] Date of Birth/Sex: Treating RN: 28-Aug-1960 (62 y.o. Fredderick Phenix Primary Care Provider: Hale Bogus, EA GLE Other Clinician: Referring Provider: Treating Provider/Extender: Rodney Langton, EA GLE Weeks in Treatment: 3 Diagnosis Coding ICD-10 Codes Code Description L89.313 Pressure ulcer of right buttock, stage 3 C34.31 Malignant neoplasm of lower lobe, right bronchus or lung C67.9  Malignant neoplasm of bladder, unspecified I70.0 Atherosclerosis of aorta G89.29 Other chronic pain Z72.0 Tobacco use Facility Procedures : CPT4 Code: [id number] Description: 9212 - WOUND CARE VISIT-LEV 2 EST PT Modifier: Quantity: 1 Physician Procedures : CPT4 Code Description Modifier [id number] - WC PHYS LEVEL 3 - EST PT ICD-10 Diagnosis Description L89.313 Pressure ulcer of right buttock, stage 3 C34.31 Malignant neoplasm of lower lobe, right bronchus or lung C67.9 Malignant neoplasm of bladder,  unspecified Z72.0 Tobacco use Quantity: 1 Electronic Signature(s) Signed: 10/11/2022 2:56:18 PM By: Duanne Guess MD FACS Entered By: Duanne Guess on 10/11/2022 14:56:18

## 2022-10-17 ENCOUNTER — Telehealth: Payer: Self-pay | Admitting: Hematology and Oncology

## 2022-10-24 ENCOUNTER — Inpatient Hospital Stay: Payer: Medicaid Other

## 2022-10-24 ENCOUNTER — Inpatient Hospital Stay: Payer: Medicaid Other | Admitting: Hematology and Oncology

## 2022-10-24 NOTE — Progress Notes (Signed)
LEANI, ELSTAD ([id number]) [id number] Page 1 of 9 Visit Report for 09/20/2022 Chief Complaint Document Details Patient Name: Date of Service: Lauren Salazar, Lauren Salazar 09/20/2022 1:30 PM Medical Record Number: [id number] Patient Account Number: [id number] Date of Birth/Sex: Treating RN: [birth year] (62 y.o. F) Primary Care Provider: Hale Bogus, EA GLE Other Clinician: Referring Provider: Treating Provider/Extender: Rodney Langton, EA GLE Weeks in Treatment: 0 Information Obtained from: Patient Chief Complaint Patient is at the clinic for treatment of an open pressure ulcer Electronic Signature(s) Signed: 09/20/2022 2:08:40 PM By: Duanne Guess MD FACS Previous Signature: 09/20/2022 1:45:11 PM Version By: Duanne Guess MD FACS Entered By: Duanne Guess on 09/20/2022 14:08:40 -------------------------------------------------------------------------------- Debridement Details Patient Name: Date of Service: Lauren Flattery Salazar. 09/20/2022 1:30 PM Medical Record Number: [id number] Patient Account Number: [id number] Date of Birth/Sex: Treating RN: 09/02/1960 (62 y.o. Fredderick Phenix Primary Care Provider: Hale Bogus, EA GLE Other Clinician: Referring Provider: Treating Provider/Extender: Rodney Langton, EA GLE Weeks in Treatment: 0 Debridement Performed for Assessment: Wound #1 Right Ischium Performed By: Physician Duanne Guess, MD Debridement Type: Debridement Level of Consciousness (Pre-procedure): Awake and Alert Pre-procedure Verification/Time Out Yes - 13:57 Taken: Start Time: 13:57 Pain Control: Lidocaine 4% T opical Solution Percent of Wound Bed Debrided: 100% T Area Debrided (cm): otal 0.24 Tissue and other material debrided: Non-Viable, Slough, Slough Level: Non-Viable Tissue Debridement Description: Selective/Open Wound Instrument: Curette Bleeding: Minimum Hemostasis Achieved: Pressure Response  to Treatment: Procedure was tolerated well Level of Consciousness (Post- Awake and Alert procedure): Post Debridement Measurements of Total Wound Length: (cm) 0.6 Stage: Category/Stage II Width: (cm) 0.5 Depth: (cm) 0.3 Volume: (cm) 0.071 Character of Wound/Ulcer Post Debridement: Improved Post Procedure Diagnosis Same as Lauren Salazar, Lauren Salazar ([id number]) [id number] Page 2 of 9 Notes scribed for Dr. Lady Gary by Samuella Bruin, RN Electronic Signature(s) Signed: 09/20/2022 3:55:17 PM By: Duanne Guess MD FACS Signed: 09/20/2022 4:11:00 PM By: Gelene Mink By: Samuella Bruin on 09/20/2022 14:10:47 -------------------------------------------------------------------------------- HPI Details Patient Name: Date of Service: Lauren Flattery Salazar. 09/20/2022 1:30 PM Medical Record Number: [id number] Patient Account Number: [id number] Date of Birth/Sex: Treating RN: 1960/07/23 (62 y.o. F) Primary Care Provider: Hale Bogus, EA GLE Other Clinician: Referring Provider: Treating Provider/Extender: Rodney Langton, EA GLE Weeks in Treatment: 0 History of Present Illness HPI Description: ADMISSION 09/20/2022 This is a 62 year old woman with metastatic adenosquamous carcinoma of the lung and history of bladder cancer status post pelvic exenteration and ileal conduit formation. She is currently receiving palliative chemotherapy with docetaxel and ramucirumab with udyenca. At an oncology visit on 30 April, she was noted to have a stage II pressure ulcer at the gluteal calf and was referred to wound care for further evaluation and management. She was prescribed antibiotic therapy due to her immunocompromised status. She is no longer taking antibiotics. According to the electronic medical record, she was advised to offload the area. Of note, she does continue to smoke. She smokes about 1 pack of cigarettes per day. She is being  followed by palliative care. She has been applying Neosporin and an adhesive bandage. In addition, due to remaining primarily on her left side, she is starting to develop a pressure ulcer on her ear. Currently this is limited to stage I without any actual skin breakdown. Electronic Signature(s) Signed: 09/20/2022 2:09:50 PM By: Duanne Guess MD FACS Previous Signature: 09/20/2022 1:49:59 PM Version By: Duanne Guess MD FACS Entered By: Duanne Guess on 09/20/2022 14:09:49 --------------------------------------------------------------------------------  Physical Exam Details Patient Name: Date of Service: Lauren Salazar, Lauren Salazar 09/20/2022 1:30 PM Medical Record Number: [id number] Patient Account Number: [id number] Date of Birth/Sex: Treating RN: 04/12/1961 (62 y.o. F) Primary Care Provider: CO Shawnie Dapper, EA GLE Other Clinician: Referring Provider: Treating Provider/Extender: Duanne Guess CO LLEGE, EA GLE Weeks in Treatment: 0 Constitutional .Tachycardic, asymptomatic. . . No acute distress. Respiratory Normal work of breathing on room air. Notes 09/20/2022: On her right ischium, there is a small circular ulcer with about 3 mm of depth. There is light slough on the surface. On her left ear, there is evidence of pressure with reddening of the skin but without any actual tissue breakdown. Electronic Signature(s) Signed: 09/20/2022 2:11:01 PM By: Duanne Guess MD FACS Entered By: Duanne Guess on 09/20/2022 14:11:00 Lauren Salazar ([id number]) [id number] Page 3 of 9 -------------------------------------------------------------------------------- Physician Orders Details Patient Name: Date of Service: Lauren Salazar, Lauren Salazar 09/20/2022 1:30 PM Medical Record Number: [id number] Patient Account Number: [id number] Date of Birth/Sex: Treating RN: 1960/06/19 (62 y.o. Fredderick Phenix Primary Care Provider: Hale Bogus, EA GLE Other  Clinician: Referring Provider: Treating Provider/Extender: Rodney Langton, EA GLE Weeks in Treatment: 0 Verbal / Phone Orders: No Diagnosis Coding ICD-10 Coding Code Description L89.313 Pressure ulcer of right buttock, stage 3 C34.31 Malignant neoplasm of lower lobe, right bronchus or lung C67.9 Malignant neoplasm of bladder, unspecified I70.0 Atherosclerosis of aorta G89.29 Other chronic pain Z72.0 Tobacco use Follow-up Appointments Return appointment in 3 weeks. - Dr. Lady Gary - room 2 Anesthetic (In clinic) Topical Lidocaine 4% applied to wound bed Bathing/ Shower/ Hygiene May shower and wash wound with soap and water. Off-Loading Turn and reposition every 2 hours - purchase egg crate foam or memory foam and cut a hole where your wound will be to distribute pressure Additional Orders / Instructions Follow Nutritious Diet - protein shakes Juven Shake 1-2 times daily. Home Health Admit to Home Health for skilled nursing wound care. May utilize formulary equivalent dressing for wound treatment orders unless otherwise specified. New wound care orders this week; continue Home Health for wound care. May utilize formulary equivalent dressing for wound treatment orders unless otherwise specified. Dressing changes to be completed by Home Health on Monday / Wednesday / Friday except when patient has scheduled visit at [facility]. Other Home Health Orders/Instructions: Wound Treatment Wound #1 - Ischium Wound Laterality: Right Cleanser: Soap and Water 1 x Per Day/30 Days Discharge Instructions: May shower and wash wound with dial antibacterial soap and water prior to dressing change. Cleanser: Wound Cleanser 1 x Per Day/30 Days Discharge Instructions: Cleanse the wound with wound cleanser prior to applying a clean dressing using gauze sponges, not tissue or cotton balls. Prim Dressing: Maxorb Extra Ag+ Alginate Dressing, 2x2 (in/in) 1 x Per Day/30 Days ary Discharge  Instructions: Apply to wound bed as instructed Secondary Dressing: ALLEVYN Gentle Border, 3x3 (in/in) 1 x Per Day/30 Days Discharge Instructions: Apply over primary dressing as directed. Patient Medications llergies: No Known Allergies A Notifications Medication Indication Start End 09/20/2022 lidocaine DOSE topical 4 % cream - cream topical Lauren Salazar, Lauren Salazar ([id number]) [id number].pdf Page 4 of 9 Electronic Signature(s) Signed: 09/20/2022 3:55:17 PM By: Duanne Guess MD FACS Entered By: Duanne Guess on 09/20/2022 14:15:07 -------------------------------------------------------------------------------- Problem List Details Patient Name: Date of Service: Lauren Flattery Salazar. 09/20/2022 1:30 PM Medical Record Number: [id number] Patient Account Number: [id number] Date of Birth/Sex: Treating RN: April 13, 1961 (62 y.o. F) Primary Care Provider: CO LLEGE, EA  GLE Other Clinician: Referring Provider: Treating Provider/Extender: Rodney Langton, EA GLE Weeks in Treatment: 0 Active Problems ICD-10 Encounter Code Description Active Date MDM Diagnosis L89.313 Pressure ulcer of right buttock, stage 3 09/20/2022 No Yes C34.31 Malignant neoplasm of lower lobe, right bronchus or lung 09/20/2022 No Yes C67.9 Malignant neoplasm of bladder, unspecified 09/20/2022 No Yes I70.0 Atherosclerosis of aorta 09/20/2022 No Yes G89.29 Other chronic pain 09/20/2022 No Yes Z72.0 Tobacco use 09/20/2022 No Yes Inactive Problems Resolved Problems Electronic Signature(s) Signed: 09/20/2022 2:08:22 PM By: Duanne Guess MD FACS Previous Signature: 09/20/2022 1:44:58 PM Version By: Duanne Guess MD FACS Entered By: Duanne Guess on 09/20/2022 14:08:22 -------------------------------------------------------------------------------- Progress Note Details Patient Name: Date of Service: Lauren Flattery Salazar. 09/20/2022 1:30 PM Lauren Salazar ([id number])  [id number] Page 5 of 9 Medical Record Number: [id number] Patient Account Number: [id number] Date of Birth/Sex: Treating RN: 1960-07-22 (62 y.o. F) Primary Care Provider: Hale Bogus, EA GLE Other Clinician: Referring Provider: Treating Provider/Extender: Rodney Langton, EA GLE Weeks in Treatment: 0 Subjective Chief Complaint Information obtained from Patient Patient is at the clinic for treatment of an open pressure ulcer History of Present Illness (HPI) ADMISSION 09/20/2022 This is a 62 year old woman with metastatic adenosquamous carcinoma of the lung and history of bladder cancer status post pelvic exenteration and ileal conduit formation. She is currently receiving palliative chemotherapy with docetaxel and ramucirumab with udyenca. At an oncology visit on 30 April, she was noted to have a stage II pressure ulcer at the gluteal calf and was referred to wound care for further evaluation and management. She was prescribed antibiotic therapy due to her immunocompromised status. She is no longer taking antibiotics. According to the electronic medical record, she was advised to offload the area. Of note, she does continue to smoke. She smokes about 1 pack of cigarettes per day. She is being followed by palliative care. She has been applying Neosporin and an adhesive bandage. In addition, due to remaining primarily on her left side, she is starting to develop a pressure ulcer on her ear. Currently this is limited to stage I without any actual skin breakdown. Patient History Information obtained from Patient. Allergies No Known Allergies Family History Cancer - Mother,Father, Diabetes - Father, Thyroid Problems - Mother, No family history of Heart Disease, Hereditary Spherocytosis, Hypertension, Kidney Disease, Lung Disease, Seizures, Stroke, Tuberculosis. Social History Current every day smoker - 1ppd, Marital Status - Divorced, Alcohol Use - Never, Drug  Use - No History, Caffeine Use - Daily. Medical History Respiratory Patient has history of Chronic Obstructive Pulmonary Disease (COPD) Endocrine Denies history of Type I Diabetes, Type II Diabetes Oncologic Patient has history of Received Chemotherapy Hospitalization/Surgery History - Ir imaging guided port insertion. - Robot assisted laparoscopic complete cystect ileal conduit. - Lymphadenectomy bilateral. - Cystoscopy with injection. - Transurethral resection of bladder tumor. - Cervical fusion. 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 Eyes Complains or has symptoms of Glasses / Contacts. Ear/Nose/Mouth/Throat Denies complaints or symptoms of Chronic sinus problems or rhinitis. Respiratory Complains or has symptoms of Chronic or frequent coughs - persistent dry cough. Gastrointestinal Denies complaints or symptoms of Frequent diarrhea, Nausea, Vomiting. Endocrine Denies complaints or symptoms of Heat/cold intolerance. Genitourinary Denies complaints or symptoms of Frequent urination. Integumentary (Skin) Complains or has symptoms of Wounds. Musculoskeletal Complains or  has symptoms of Muscle Pain, Muscle Weakness. 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 [address] LEELA, CRACKEL ([id number]) [id number] Page 6 of 9 Constitutional Tachycardic, asymptomatic. No acute distress. Vitals Time Taken: 1:41 PM, Height: 66 in, Source: Stated, Weight: 117 lbs, Source: Stated, BMI: 18.9, Temperature: 98.7 F, Pulse: 116 bpm, Respiratory Rate: 16 breaths/min, Blood Pressure: 129/73 mmHg. Respiratory Normal work of breathing on room air. General Notes: 09/20/2022: On her  right ischium, there is a small circular ulcer with about 3 mm of depth. There is light slough on the surface. On her left ear, there is evidence of pressure with reddening of the skin but without any actual tissue breakdown. Integumentary (Hair, Skin) Wound #1 status is Open. Original cause of wound was Pressure Injury. The date acquired was: 07/25/2022. The wound is located on the Right Ischium. The wound measures 0.6cm length x 0.5cm width x 0.3cm depth; 0.236cm^2 area and 0.071cm^3 volume. There is Fat Layer (Subcutaneous Tissue) exposed. There is no tunneling or undermining noted. There is a medium amount of serous drainage noted. The wound margin is distinct with the outline attached to the wound base. There is large (67-100%) red granulation within the wound bed. There is a small (1-33%) amount of necrotic tissue within the wound bed including Adherent Slough. The periwound skin appearance had no abnormalities noted for texture. The periwound skin appearance had no abnormalities noted for moisture. The periwound skin appearance had no abnormalities noted for color. Periwound temperature was noted as No Abnormality. Assessment Active Problems ICD-10 Pressure ulcer of right buttock, stage 3 Malignant neoplasm of lower lobe, right bronchus or lung Malignant neoplasm of bladder, unspecified Atherosclerosis of aorta Other chronic pain Tobacco use Procedures Wound #1 Pre-procedure diagnosis of Wound #1 is a Pressure Ulcer located on the Right Ischium . There was a Selective/Open Wound Non-Viable Tissue Debridement with a total area of 0.24 sq cm performed by Duanne Guess, MD. With the following instrument(s): Curette to remove Non-Viable tissue/material. Material removed includes [facility] after achieving pain control using Lidocaine 4% Topical Solution. No specimens were taken. A time out was conducted at 13:57, prior to the start of the procedure. A Minimum amount of bleeding was controlled  with Pressure. The procedure was tolerated well. Post Debridement Measurements: 0.6cm length x 0.5cm width x 0.3cm depth; 0.071cm^3 volume. Post debridement Stage noted as Category/Stage II. Character of Wound/Ulcer Post Debridement is improved. Post procedure Diagnosis Wound #1: Same as Pre-Procedure General Notes: scribed for Dr. Lady Gary by Samuella Bruin, RN. Plan Follow-up Appointments: Return appointment in 3 weeks. - Dr. Lady Gary - room 2 Anesthetic: (In clinic) Topical Lidocaine 4% applied to wound bed Bathing/ Shower/ Hygiene: May shower and wash wound with soap and water. Off-Loading: Turn and reposition every 2 hours - purchase egg crate foam or memory foam and cut a hole where your wound will be to distribute pressure Additional Orders / Instructions: Follow Nutritious Diet - protein shakes Juven Shake 1-2 times daily. Home Health: Admit to Home Health for skilled nursing wound care. May utilize formulary equivalent dressing for wound treatment orders unless otherwise specified. New wound care orders this week; continue Home Health for wound care. May utilize formulary equivalent dressing for wound treatment orders unless otherwise specified. Dressing changes to be completed by Home Health on Monday / Wednesday / Friday except when patient has scheduled visit at [facility]. Other Home Health Orders/Instructions: The following medication(s) was prescribed: lidocaine topical 4 % cream  cream topical was prescribed at facility WOUND #1: - Ischium Wound Laterality: Right Cleanser: Soap and Water 1 x Per Day/30 Days Discharge Instructions: May shower and wash wound with dial antibacterial soap and water prior to dressing change. Cleanser: Wound Cleanser 1 x Per Day/30 Days Discharge Instructions: Cleanse the wound with wound cleanser prior to applying a clean dressing using gauze sponges, not tissue or cotton balls. Prim Dressing: Maxorb Extra Ag+ Alginate Dressing, 2x2  (in/in) 1 x Per Day/30 Days ary Discharge Instructions: Apply to wound bed as instructed Lauren Salazar, Lauren Salazar ([id number]) [phone].pdf Page 7 of 9 Secondary Dressing: ALLEVYN Gentle Border, 3x3 (in/in) 1 x Per Day/30 Days Discharge Instructions: Apply over primary dressing as directed. 09/20/2022: This is a 62 year old woman with metastatic lung cancer. 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 This is resulted in a stage III pressure ulcer. On her right ischium, there is a small circular ulcer with about 3 mm of depth. There is light slough on the surface. On her left ear, there is evidence of pressure with reddening of the skin but without any actual tissue breakdown. She does have a pillow she has been sitting on, but there is no opening to accommodate the wound to redistribute the pressure. As she is resting on her left side, the ear is subjective pressure, as well. I recommended that she get some memory foam or something similar and cut out holes to accommodate both her ear and her right ischium whenever she is sitting or lying down. I used a curette to debride some slough from the ischial wound and we will apply silver alginate with a foam border dressing to this location. Further complicating the issue is her poor oral intake. Secondary to her chemotherapy. Nothing tastes particularly good to her and she is not getting adequate protein. She is willing to drink YooHoo so I recommended that she add some protein powder to this. We also gave her some samples of Juven to see if she is willing to drink it to help with her wound healing. She will follow-up in 3 weeks due to travel and chemotherapy scheduling. Electronic Signature(s) Signed: 10/24/2022 1:56:15 PM By: Pearletha Alfred Signed: 10/24/2022 2:55:18 PM By: Duanne Guess MD FACS Previous Signature: 09/20/2022 2:23:33 PM  Version By: Duanne Guess MD FACS Previous Signature: 09/20/2022 2:15:18 PM Version By: Duanne Guess MD FACS Entered By: Pearletha Alfred on 10/24/2022 13:56:14 -------------------------------------------------------------------------------- HxROS Details Patient Name: Date of Service: Lauren Flattery Salazar. 09/20/2022 1:30 PM Medical Record Number: [id number] Patient Account Number: [id number] Date of Birth/Sex: Treating RN: 08/12/1960 (62 y.o. Fredderick Phenix Primary Care Provider: Hale Bogus, EA GLE Other Clinician: Referring Provider: Treating Provider/Extender: Rodney Langton, EA GLE Weeks in Treatment: 0 Information Obtained From Patient Constitutional Symptoms (General Health) Complaints and Symptoms: Negative for: Fatigue; Fever; Chills; Marked Weight Change Eyes Complaints and Symptoms: Positive for: Glasses / Contacts Ear/Nose/Mouth/Throat Complaints and Symptoms: Negative for: Chronic sinus problems or rhinitis Respiratory Complaints and Symptoms: Positive for: Chronic or frequent coughs - persistent dry cough Medical History: Positive for: Chronic Obstructive Pulmonary Disease (COPD) Gastrointestinal Complaints and Symptoms: Negative for: Frequent diarrhea; Nausea; Vomiting Endocrine Complaints and Symptoms: Negative for: Heat/cold intolerance Medical HistoryLILYANAH, Lauren Salazar ([id number]) [id number] Page 8 of 9 Negative for: Type I Diabetes; Type II Diabetes Genitourinary Complaints and Symptoms: Negative for: Frequent urination Integumentary (Skin) Complaints  and Symptoms: Positive for: Wounds Musculoskeletal Complaints and Symptoms: Positive for: Muscle Pain; Muscle Weakness Psychiatric Complaints and Symptoms: Negative for: Claustrophobia Hematologic/Lymphatic Cardiovascular Medical History: Past Medical History Notes: hardening of the aorta Immunological Neurologic Medical History: Past  Medical History Notes: sciatica Oncologic Complaints and Symptoms: Review of System Notes: lung cancer spread to hips Medical History: Positive for: Received Chemotherapy Past Medical History Notes: bladder cancer Immunizations Pneumococcal Vaccine: Received Pneumococcal Vaccination: No Implantable Devices None Hospitalization / Surgery History Type of Hospitalization/Surgery Ir imaging guided port insertion Robot assisted laparoscopic complete cystect ileal conduit Lymphadenectomy bilateral Cystoscopy with injection Transurethral resection of bladder tumor Cervical fusion Family and Social History Cancer: Yes - Mother,Father; Diabetes: Yes - Father; Heart Disease: No; Hereditary Spherocytosis: No; Hypertension: No; Kidney Disease: No; Lung Disease: No; Seizures: No; Stroke: No; Thyroid Problems: Yes - Mother; Tuberculosis: No; Current every day smoker - 1ppd; Marital Status - Divorced; Alcohol Use: Never; Drug Use: No History; Caffeine Use: Daily; Financial Concerns: No; Food, Clothing or Shelter Needs: No; Support System Lacking: No; Transportation Concerns: No Electronic Signature(s) Signed: 09/20/2022 3:55:17 PM By: Duanne Guess MD FACS Signed: 09/20/2022 4:11:00 PM By: Gelene Mink By: Samuella Bruin on 09/20/2022 13:46:20 Lauren Salazar ([id number]) [id number] Page 9 of 9 -------------------------------------------------------------------------------- SuperBill Details Patient Name: Date of Service: Lauren Salazar, Lauren Salazar 09/20/2022 Medical Record Number: [id number] Patient Account Number: [id number] Date of Birth/Sex: Treating RN: 1961-01-23 (62 y.o. F) Primary Care Provider: CO LLEGE, EA GLE Other Clinician: Referring Provider: Treating Provider/Extender: Duanne Guess CO LLEGE, EA GLE Weeks in Treatment: 0 Diagnosis Coding ICD-10 Codes Code Description L89.313 Pressure ulcer of right buttock, stage  3 C34.31 Malignant neoplasm of lower lobe, right bronchus or lung C67.9 Malignant neoplasm of bladder, unspecified I70.0 Atherosclerosis of aorta G89.29 Other chronic pain Z72.0 Tobacco use Facility Procedures : CPT4 Code: [id number] Description: 99213 - WOUND CARE VISIT-LEV 3 EST PT Modifier: 25 Quantity: 1 : CPT4 Code: [id number] Description: 97597 - DEBRIDE WOUND 1ST 20 SQ CM OR < ICD-10 Diagnosis Description L89.313 Pressure ulcer of right buttock, stage 3 Modifier: Quantity: 1 Physician Procedures : CPT4 Code Description Modifier [id number] - WC PHYS LEVEL 4 - NEW PT 25 ICD-10 Diagnosis Description L89.313 Pressure ulcer of right buttock, stage 3 C34.31 Malignant neoplasm of lower lobe, right bronchus or lung G89.29 Other chronic pain Z72.0  Tobacco use Quantity: 1 : [id number] 97597 - WC PHYS DEBR WO ANESTH 20 SQ CM ICD-10 Diagnosis Description L89.313 Pressure ulcer of right buttock, stage 3 Quantity: 1 Electronic Signature(s) Signed: 09/20/2022 3:55:17 PM By: Duanne Guess MD FACS Signed: 09/20/2022 4:11:00 PM By: Samuella Bruin Previous Signature: 09/20/2022 2:24:21 PM Version By: Duanne Guess MD FACS Entered By: Samuella Bruin on 09/20/2022 14:26:31

## 2022-10-25 ENCOUNTER — Other Ambulatory Visit: Payer: Self-pay

## 2022-10-25 DIAGNOSIS — Z515 Encounter for palliative care: Secondary | ICD-10-CM

## 2022-10-25 DIAGNOSIS — C3431 Malignant neoplasm of lower lobe, right bronchus or lung: Secondary | ICD-10-CM

## 2022-10-25 DIAGNOSIS — G893 Neoplasm related pain (acute) (chronic): Secondary | ICD-10-CM

## 2022-10-25 NOTE — Telephone Encounter (Signed)
Refill request per fax

## 2022-10-26 ENCOUNTER — Encounter: Payer: Self-pay | Admitting: Hematology and Oncology

## 2022-10-26 ENCOUNTER — Encounter: Payer: Self-pay | Admitting: Physician Assistant

## 2022-10-26 ENCOUNTER — Inpatient Hospital Stay: Payer: Medicaid Other

## 2022-10-26 MED ORDER — MELOXICAM 15 MG PO TABS
15.0000 mg | ORAL_TABLET | Freq: Every day | ORAL | 0 refills | Status: DC
Start: 2022-10-26 — End: 2022-11-28

## 2022-10-31 NOTE — Progress Notes (Unsigned)
[facility] [phone]   [phone]  PROGRESS NOTE  Patient Care Team: [facility] [place] as PCP - General (Family Medicine)  Hematological/Oncological History # Metastatic Adenosquamous Lung Cancer  05/10/2022: last visit with Dr. Clelia Croft 05/26/2022: CT scan showed enlargement of RLL mass, right iliac crest mass with large soft tissue components extending in the iliacus and gluteus musculature. Additionally new pathologic right external iliac adenopathy.  06/07/2022: transition care to Dr. Leonides Schanz  07/12/2022: Cycle 1 Day 1 of docetaxel and ramucirumab 08/02/2022: Cycle 2 Day 1 of docetaxel and ramucirumab 08/23/2022: Cycle 3 Day 1 of docetaxel and ramucirumab 10/04/2022: Cycle 4 Day 1 of docetaxel and ramucirumab 11/01/2022: Cycle 5 Day 1 of docetaxel and ramucirumab  Interval History:  Lauren Salazar 62 y.o. female with medical history significant for metastatic adenosquamous lung cancer presents for a follow up visit. The patient's last visit was on 10/04/2022. She presents today for a follow up before Cycle 5, Day 1  Lauren Salazar reports she had a good 4 July.  She is with her 4 grandchildren aged 36-10 and set off fireworks and had a cookout.  She reports that she is ready for cycle 5 of treatment cycle 4 was "okay".  She does not have any nausea or vomiting and she reports her bowels "cleared up after a few days".  She did have some fatigue as well as a drop in appetite.  She has been steadily losing weight, having weighed 120 pounds in April and is now down 9 pounds from that time.  She reports that in total she is lost about 30 to 40 pounds.  She simply does not have a desire to eat.  She notes that the fatigue does last for the entire cycle. She denies fevers, chills, sweats, shortness of breath, chest pain or cough. She has no other complaints. Rest of the 10 point ROS was otherwise negative.  She notes that she is willing and  able to proceed with chemotherapy treatment today.  MEDICAL HISTORY:  Past Medical History:  Diagnosis Date   Bladder tumor    Cancer (HCC)    Complication of anesthesia    slow to wake after spinal fusion, then was "looney" for the following 24 hours    Persistent dry cough    ongoing since feb 2020    SURGICAL HISTORY: Past Surgical History:  Procedure Laterality Date   CERVICAL FUSION  2005   c5-c6      CYSTOSCOPY WITH INJECTION N/A 02/01/2019   Procedure: CYSTOSCOPY WITH INJECTION;  Surgeon: Sebastian Ache, MD;  Location: [facility];  Service: Urology;  Laterality: N/A;   IR IMAGING GUIDED PORT INSERTION  07/25/2022   LYMPHADENECTOMY Bilateral 02/01/2019   Procedure: LYMPHADENECTOMY;  Surgeon: Sebastian Ache, MD;  Location: [facility];  Service: Urology;  Laterality: Bilateral;   ROBOT ASSISTED LAPAROSCOPIC COMPLETE CYSTECT ILEAL CONDUIT N/A 02/01/2019   Procedure: XI ROBOTIC ASSISTED LAPAROSCOPIC COMPLETE CYSTECTOMY, ILEAL CONDUIT, HYSTERECTOMY WITH BILATERAL SALPINGOOPHORECTOMY;  Surgeon: Sebastian Ache, MD;  Location: [facility];  Service: Urology;  Laterality: N/A;  6 HRS   TRANSURETHRAL RESECTION OF BLADDER TUMOR N/A 10/31/2018   Procedure: TRANSURETHRAL RESECTION OF BLADDER TUMOR (TURBT) WITH CYSTOSCOPY/ POSSIBLE INTRAVESICAL GEMCITABINE;  Surgeon: Rene Paci, MD;  Location: [facility];  Service: Urology;  Laterality: N/A;    SOCIAL HISTORY: Social History   Socioeconomic History   Marital status: Divorced    Spouse name: Not on file   Number of children:  Not on file   Years of education: Not on file   Highest education level: Not on file  Occupational History   Not on file  Tobacco Use   Smoking status: Every Day    Packs/day: 1    Types: Cigarettes   Smokeless tobacco: Never   Tobacco comments:    smoking since age 50 , she is cutting back, using patches, trying to stop. 04/02/19  Vaping Use   Vaping Use: Never used  Substance and Sexual Activity   Alcohol use:  No   Drug use: No   Sexual activity: Not Currently  Other Topics Concern   Not on file  Social History Narrative   Not on file   Social Determinants of Health   Financial Resource Strain: Not on file  Food Insecurity: Not on file  Transportation Needs: No Transportation Needs (04/02/2019)   PRAPARE - Administrator, Civil Service (Medical): No    Lack of Transportation (Non-Medical): No  Physical Activity: Not on file  Stress: Not on file  Social Connections: Not on file  Intimate Partner Violence: Not At Risk (04/02/2019)   Humiliation, Afraid, Rape, and Kick questionnaire    Fear of Current or Ex-Partner: No    Emotionally Abused: No    Physically Abused: No    Sexually Abused: No    FAMILY HISTORY: No family history on file.  ALLERGIES:  has No Known Allergies.  MEDICATIONS:  Current Outpatient Medications  Medication Sig Dispense Refill   benzonatate (TESSALON) 100 MG capsule Take 2 capsules (200 mg total) by mouth 3 (three) times daily as needed for cough. 20 capsule 2   diphenoxylate-atropine ([name]) 2.5-0.025 MG tablet Take 1 tablet by mouth 4 (four) times daily as needed for diarrhea or loose stools. 30 tablet 0   folic acid (FOLVITE) 1 MG tablet TAKE 1 TABLET(1 MG) BY MOUTH DAILY 90 tablet 3   HYDROcodone-acetaminophen (NORCO) 5-325 MG tablet Take 1 tablet by mouth every 4 (four) hours as needed for moderate pain. 120 tablet 0   lidocaine (LIDODERM) 5 % Place 1 patch onto the skin daily. Remove & Discard patch within 12 hours or as directed by MD 30 patch 0   lidocaine-prilocaine (EMLA) cream Apply 1 Application topically as needed. 30 g 0   magic mouthwash (nystatin, diphenhydrAMINE, alum & mag hydroxide) suspension mixture Swish and swallow 10 mLs 3 (three) times daily. 240 mL 2   meloxicam (MOBIC) 15 MG tablet Take 1 tablet (15 mg total) by mouth daily. 30 tablet 0   nicotine (NICODERM CQ - DOSED IN MG/24 HOURS) 21 mg/24hr patch Place 21 mg onto the  skin daily. (Patient not taking: Reported on 12/22/2020)     ondansetron (ZOFRAN) 8 MG tablet Take 1 tablet (8 mg total) by mouth every 8 (eight) hours as needed for nausea or vomiting. 20 tablet 1   oxyCODONE ER (XTAMPZA ER) 13.5 MG C12A Take 13.5 mg by mouth every 12 (twelve) hours as needed. 60 capsule 0   prochlorperazine (COMPAZINE) 10 MG tablet Take 1 tablet (10 mg total) by mouth every 6 (six) hours as needed for nausea or vomiting. 30 tablet 2   No current facility-administered medications for this visit.    REVIEW OF SYSTEMS:   Constitutional: ( - ) fevers, ( - )  chills , ( - ) night sweats Eyes: ( - ) blurriness of vision, ( - ) double vision, ( - ) watery eyes Ears, nose, mouth, throat, and  face: ( - ) mucositis, ( - ) sore throat Respiratory: ( - ) cough, ( - ) dyspnea, ( - ) wheezes Cardiovascular: ( - ) palpitation, ( - ) chest discomfort, ( - ) lower extremity swelling Gastrointestinal:  ( + ) nausea, ( - ) heartburn, ( +) change in bowel habits Skin: ( - ) abnormal skin rashes Lymphatics: ( - ) new lymphadenopathy, ( - ) easy bruising Neurological: ( - ) numbness, ( - ) tingling, ( - ) new weaknesses Behavioral/Psych: ( - ) mood change, ( - ) new changes  All other systems were reviewed with the patient and are negative.  PHYSICAL EXAMINATION: ECOG PERFORMANCE STATUS: 1 - Symptomatic but completely ambulatory  There were no vitals filed for this visit.  There were no vitals filed for this visit.  Day 1 10/04/22  Weight 114 lb 4 oz (51.8 kg)  Temp 97.7 F (36.5 C)  Temp src Oral  Pulse 99  Resp 19  BP 151/91 (H)    GENERAL: Chronically ill-appearing middle-aged Caucasian female, alert, no distress and comfortable SKIN: skin color, texture, turgor are normal, no rashes or significant lesions EYES: conjunctiva are pink and non-injected, sclera clear LUNGS: clear to auscultation and percussion with normal breathing effort HEART: regular rate & rhythm and no  murmurs and no lower extremity edema Musculoskeletal: no cyanosis of digits and no clubbing  PSYCH: alert & oriented x 3, fluent speech NEURO: no focal motor/sensory deficits  LABORATORY DATA:  I have reviewed the data as listed    Latest Ref Rng & Units 10/04/2022   12:39 PM 08/23/2022   10:42 AM 08/02/2022   11:16 AM  CBC  WBC 4.0 - 10.5 K/uL 8.5  9.8  14.1   Hemoglobin 12.0 - 15.0 g/dL 16.1  09.6  04.5   Hematocrit 36.0 - 46.0 % 38.4  37.6  37.2   Platelets 150 - 400 K/uL 273  309  430        Latest Ref Rng & Units 10/04/2022   12:39 PM 08/23/2022   10:42 AM 08/02/2022   11:16 AM  CMP  Glucose 70 - 99 mg/dL 409  811  914   BUN 8 - 23 mg/dL 17  14  15    Creatinine 0.44 - 1.00 mg/dL 7.82  9.56  2.13   Sodium 135 - 145 mmol/L 141  136  138   Potassium 3.5 - 5.1 mmol/L 4.3  4.2  3.9   Chloride 98 - 111 mmol/L 108  102  102   CO2 22 - 32 mmol/L 26  28  29    Calcium 8.9 - 10.3 mg/dL 9.2  9.2  9.1   Total Protein 6.5 - 8.1 g/dL 7.1  6.9  7.1   Total Bilirubin 0.3 - 1.2 mg/dL 0.3  0.2  0.3   Alkaline Phos 38 - 126 U/L 109  86  102   AST 15 - 41 U/L 15  15  17    ALT 0 - 44 U/L 8  7  11      RADIOGRAPHIC STUDIES: No results found.  ASSESSMENT & PLAN IOANNA GOUGE 62 y.o. female with medical history significant for metastatic adenosquamous lung cancer presents for a follow up visit.  She is status post robotic assisted laparoscopic cystectomy and pelvic exoneration including hysterectomy, bilateral salpingo-oophorectomy and ileoconduit diversion completed on February 01, 2019.  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   Radiation therapy with weekly chemotherapy  utilizing carboplatin and paclitaxel.  Week 1 of chemotherapy started on May 14, 2019.  Therapy concluded in February 2021.   Durvalumab 10 mg/kg cycle 1 on Sep 03, 2019 every 14 days.  She completed 4 cycles of therapy.   Carboplatin with Alimta and Pembrolizumab started on  August 04, 2020.  She completed 3 cycles of therapy in June 2022.   Pembrolizumab 400 mg every 6 weeks started on December 22, 2020.  Therapy discontinued and March 2023 due to patient's preference.  Progression noted on CT scan from 05/26/2022.   Completed palliative radiation to her R hip/pelvis on 07/01/2022. Received 30 Gy in 10 Fx.   Started Docetaxel and Ramucirumab on 07/12/2022.  # Metastatic Adenosquamous Carcinoma of the Lung  --patient has undergone numerous lines of treatment including chemoradiation followed by immunotherapy, subsequent carbo/pem/pem and then monotherapy pembrolizumab --progression of disease noted on 05/26/2022 CT scan. --Started docetaxel plus ramucirumab on 07/12/2022 --Due to poor tolerance with Cycle 1, dose reduced Doxcetaxel from 75 mg/m2 to 60 mg/m2 starting Cycle 2.  Plan: -- today is Cycle 5 Day 1 of Docetaxel/Ramucircumab -- labs today show white blood cell 6.8, Hgb 13.6, MCV 93.4, Plt 237. Creatinine and LFTs normal.   --Review most recent CT scan from 09/09/2022 which showed mixed response. Recommend to continue due to recent gap in treatment and get a short term CT scan in 8 weeks (late July 2024).  --RTC in 3 weeks with labs, follow up before Cycle 6, Day 1 Docetaxel/Ramucircumab.  # Abdominal Pain/ Cancer Related Pain --Prescribed oxycodone 5 mg as needed, however patient avoids taking this as it causes her to be "loopy". --Patient completed palliative radiation with radiation oncology to right hip/pelvis with improvement of pain --Patient is c/o right sided mid back pain/hip pain. Currently take Norco 5-325 mg nightly to help her sleep but pain relief is short term.  --continue long acting pain medication with Xtampza 13.5 mg q 12 hours.   #Nausea: --Added IV emend to her pre-meds --Continue to take zofran and compazine as needed.   #Diarrhea: --Secondary to chemotherapy --Continue to take imodium as needed.   #Joint pain 2/2 GCSF  injection: --Recommend to take Claritin for several days with injection to minimize side effects.    No orders of the defined types were placed in this encounter.   All questions were answered. The patient knows to call the clinic with any problems, questions or concerns.  A total of more than 30 minutes were spent on this encounter with face-to-face time and non-face-to-face time, including preparing to see the patient, ordering tests and/or medications, counseling the patient and coordination of care as outlined above.   Ulysees Barns, MD Department of Hematology/Oncology [facility] Phone: [phone] Pager: [phone] Email: [email or web address]    10/31/2022 7:42 AM

## 2022-11-01 ENCOUNTER — Other Ambulatory Visit: Payer: Self-pay

## 2022-11-01 ENCOUNTER — Inpatient Hospital Stay: Payer: Medicaid Other

## 2022-11-01 ENCOUNTER — Inpatient Hospital Stay: Payer: Medicaid Other | Attending: Oncology

## 2022-11-01 ENCOUNTER — Inpatient Hospital Stay (HOSPITAL_BASED_OUTPATIENT_CLINIC_OR_DEPARTMENT_OTHER): Payer: Medicaid Other | Admitting: Hematology and Oncology

## 2022-11-01 ENCOUNTER — Other Ambulatory Visit: Payer: Self-pay | Admitting: Hematology and Oncology

## 2022-11-01 VITALS — BP 174/103 | HR 91 | Temp 98.6°F | Resp 18

## 2022-11-01 VITALS — BP 138/106 | HR 114 | Temp 97.9°F | Resp 17 | Wt 111.5 lb

## 2022-11-01 DIAGNOSIS — F17210 Nicotine dependence, cigarettes, uncomplicated: Secondary | ICD-10-CM | POA: Diagnosis not present

## 2022-11-01 DIAGNOSIS — C3490 Malignant neoplasm of unspecified part of unspecified bronchus or lung: Secondary | ICD-10-CM | POA: Diagnosis not present

## 2022-11-01 DIAGNOSIS — Z5189 Encounter for other specified aftercare: Secondary | ICD-10-CM | POA: Diagnosis not present

## 2022-11-01 DIAGNOSIS — Z5111 Encounter for antineoplastic chemotherapy: Secondary | ICD-10-CM

## 2022-11-01 DIAGNOSIS — Z95828 Presence of other vascular implants and grafts: Secondary | ICD-10-CM

## 2022-11-01 DIAGNOSIS — C679 Malignant neoplasm of bladder, unspecified: Secondary | ICD-10-CM

## 2022-11-01 DIAGNOSIS — C3431 Malignant neoplasm of lower lobe, right bronchus or lung: Secondary | ICD-10-CM

## 2022-11-01 DIAGNOSIS — Z79899 Other long term (current) drug therapy: Secondary | ICD-10-CM | POA: Insufficient documentation

## 2022-11-01 DIAGNOSIS — Z90722 Acquired absence of ovaries, bilateral: Secondary | ICD-10-CM | POA: Insufficient documentation

## 2022-11-01 DIAGNOSIS — E860 Dehydration: Secondary | ICD-10-CM

## 2022-11-01 DIAGNOSIS — Z90710 Acquired absence of both cervix and uterus: Secondary | ICD-10-CM | POA: Diagnosis not present

## 2022-11-01 LAB — CBC WITH DIFFERENTIAL (CANCER CENTER ONLY)
Abs Immature Granulocytes: 0.01 10*3/uL (ref 0.00–0.07)
Basophils Absolute: 0 10*3/uL (ref 0.0–0.1)
Basophils Relative: 1 %
Eosinophils Absolute: 0.1 10*3/uL (ref 0.0–0.5)
Eosinophils Relative: 2 %
HCT: 42.7 % (ref 36.0–46.0)
Hemoglobin: 13.6 g/dL (ref 12.0–15.0)
Immature Granulocytes: 0 %
Lymphocytes Relative: 13 %
Lymphs Abs: 0.9 10*3/uL (ref 0.7–4.0)
MCH: 29.8 pg (ref 26.0–34.0)
MCHC: 31.9 g/dL (ref 30.0–36.0)
MCV: 93.4 fL (ref 80.0–100.0)
Monocytes Absolute: 0.7 10*3/uL (ref 0.1–1.0)
Monocytes Relative: 10 %
Neutro Abs: 5 10*3/uL (ref 1.7–7.7)
Neutrophils Relative %: 74 %
Platelet Count: 237 10*3/uL (ref 150–400)
RBC: 4.57 MIL/uL (ref 3.87–5.11)
RDW: 17.3 % — ABNORMAL HIGH (ref 11.5–15.5)
WBC Count: 6.8 10*3/uL (ref 4.0–10.5)
nRBC: 0 % (ref 0.0–0.2)

## 2022-11-01 LAB — CMP (CANCER CENTER ONLY)
ALT: 7 U/L (ref 0–44)
AST: 14 U/L — ABNORMAL LOW (ref 15–41)
Albumin: 3.5 g/dL (ref 3.5–5.0)
Alkaline Phosphatase: 83 U/L (ref 38–126)
Anion gap: 6 (ref 5–15)
BUN: 22 mg/dL (ref 8–23)
CO2: 26 mmol/L (ref 22–32)
Calcium: 9.3 mg/dL (ref 8.9–10.3)
Chloride: 108 mmol/L (ref 98–111)
Creatinine: 0.93 mg/dL (ref 0.44–1.00)
GFR, Estimated: >60 mL/min (ref >60)
Glucose, Bld: 81 mg/dL (ref 70–99)
Potassium: 4.5 mmol/L (ref 3.5–5.1)
Sodium: 140 mmol/L (ref 135–145)
Total Bilirubin: 0.2 mg/dL — ABNORMAL LOW (ref 0.3–1.2)
Total Protein: 7.1 g/dL (ref 6.5–8.1)

## 2022-11-01 LAB — TSH: TSH: 6.588 u[IU]/mL — ABNORMAL HIGH (ref 0.350–4.500)

## 2022-11-01 MED ORDER — DIPHENHYDRAMINE HCL 50 MG/ML IJ SOLN
50.0000 mg | Freq: Once | INTRAMUSCULAR | Status: AC
  Administered 2022-11-01: 50 mg via INTRAVENOUS
  Filled 2022-11-01: qty 1

## 2022-11-01 MED ORDER — SODIUM CHLORIDE 0.9 % IV SOLN: Freq: Once | INTRAVENOUS | Status: AC

## 2022-11-01 MED ORDER — SODIUM CHLORIDE 0.9 % IV SOLN
10.0000 mg | Freq: Once | INTRAVENOUS | Status: AC
  Administered 2022-11-01: 10 mg via INTRAVENOUS
  Filled 2022-11-01: qty 10

## 2022-11-01 MED ORDER — HEPARIN SOD (PORK) LOCK FLUSH 100 UNIT/ML IV SOLN: 500.0000 [IU] | Freq: Once | INTRAVENOUS | Status: DC | PRN

## 2022-11-01 MED ORDER — KETOROLAC TROMETHAMINE 15 MG/ML IJ SOLN: 30.0000 mg | Freq: Once | INTRAMUSCULAR | Status: DC

## 2022-11-01 MED ORDER — SODIUM CHLORIDE 0.9 % IV SOLN
150.0000 mg | Freq: Once | INTRAVENOUS | Status: AC
  Administered 2022-11-01: 150 mg via INTRAVENOUS
  Filled 2022-11-01: qty 150

## 2022-11-01 MED ORDER — HYDROCODONE-ACETAMINOPHEN 5-325 MG PO TABS: 1.0000 | ORAL_TABLET | ORAL | 0 refills | Status: DC | PRN

## 2022-11-01 MED ORDER — SODIUM CHLORIDE 0.9 % IV SOLN
60.0000 mg/m2 | Freq: Once | INTRAVENOUS | Status: AC
  Administered 2022-11-01: 96 mg via INTRAVENOUS
  Filled 2022-11-01: qty 9.6

## 2022-11-01 MED ORDER — ACETAMINOPHEN 325 MG PO TABS
650.0000 mg | ORAL_TABLET | Freq: Once | ORAL | Status: AC
  Administered 2022-11-01: 650 mg via ORAL
  Filled 2022-11-01: qty 2

## 2022-11-01 MED ORDER — KETOROLAC TROMETHAMINE 30 MG/ML IJ SOLN
30.0000 mg | Freq: Once | INTRAMUSCULAR | Status: AC
  Administered 2022-11-01: 30 mg via INTRAVENOUS
  Filled 2022-11-01: qty 1

## 2022-11-01 MED ORDER — SODIUM CHLORIDE 0.9% FLUSH
10.0000 mL | INTRAVENOUS | Status: DC | PRN
  Administered 2022-11-01: 10 mL

## 2022-11-01 MED ORDER — SODIUM CHLORIDE 0.9% FLUSH: 10.0000 mL | INTRAVENOUS | Status: DC | PRN

## 2022-11-01 MED ORDER — SODIUM CHLORIDE 0.9 % IV SOLN
10.0000 mg/kg | Freq: Once | INTRAVENOUS | Status: AC
  Administered 2022-11-01: 500 mg via INTRAVENOUS
  Filled 2022-11-01: qty 50

## 2022-11-01 NOTE — Progress Notes (Signed)
Patient seen by Dr. Cruzita Lederer are not all within treatment parameters. Elevated BP of 138/106 and elevated HR of 114.  Labs reviewed: and are within treatment parameters.  Per physician team, patient is ready for treatment. Please note that modifications are being made to the treatment plan including addition of 500 ml of fluid over one hour.   Okay to proceed with taxotere and cyramza.

## 2022-11-01 NOTE — Progress Notes (Signed)
Pt. blood pressure elevated post treatment 174/103. Pt. denies complaints of chest pain, dizziness, no shortness of breath noted. Dr. Leonides Schanz notified and ok for discharge without further treatment.

## 2022-11-01 NOTE — Patient Instructions (Addendum)
[place] [facility]  Discharge Instructions: Thank you for choosing [facility] to provide your oncology and hematology care.   If you have a lab appointment with the Cancer Center, please go directly to the Cancer Center and check in at the registration area.   Wear comfortable clothing and clothing appropriate for easy access to any Portacath or PICC line.   We strive to give you quality time with your provider. You may need to reschedule your appointment if you arrive late (15 or more minutes).  Arriving late affects you and other patients whose appointments are after yours.  Also, if you miss three or more appointments without notifying the office, you may be dismissed from the clinic at the provider's discretion.      For prescription refill requests, have your pharmacy contact our office and allow 72 hours for refills to be completed.    Today you received the following chemotherapy and/or immunotherapy agents: Ramucirumab (Cyramza), Docetaxel (Taxotere)   To help prevent nausea and vomiting after your treatment, we encourage you to take your nausea medication as directed.  BELOW ARE SYMPTOMS THAT SHOULD BE REPORTED IMMEDIATELY: *FEVER GREATER THAN 100.4 F (38 C) OR HIGHER *CHILLS OR SWEATING *NAUSEA AND VOMITING THAT IS NOT CONTROLLED WITH YOUR NAUSEA MEDICATION *UNUSUAL SHORTNESS OF BREATH *UNUSUAL BRUISING OR BLEEDING *URINARY PROBLEMS (pain or burning when urinating, or frequent urination) *BOWEL PROBLEMS (unusual diarrhea, constipation, pain near the anus) TENDERNESS IN MOUTH AND THROAT WITH OR WITHOUT PRESENCE OF ULCERS (sore throat, sores in mouth, or a toothache) UNUSUAL RASH, SWELLING OR PAIN  UNUSUAL VAGINAL DISCHARGE OR ITCHING   Items with * indicate a potential emergency and should be followed up as soon as possible or go to the Emergency Department if any problems should occur.  Please show the CHEMOTHERAPY ALERT CARD or  IMMUNOTHERAPY ALERT CARD at check-in to the Emergency Department and triage nurse.  Should you have questions after your visit or need to cancel or reschedule your appointment, please contact [facility]  Dept: [phone]  and follow the prompts.  Office hours are 8:00 a.m. to 4:30 p.m. Monday - Friday. Please note that voicemails left after 4:00 p.m. may not be returned until the following business day.  We are closed weekends and major holidays. You have access to a nurse at all times for urgent questions. Please call the main number to the clinic Dept: [phone] and follow the prompts.   For any non-urgent questions, you may also contact your provider using MyChart. We now offer e-Visits for anyone 48 and older to request care online for non-urgent symptoms. For details visit [email or web address].   Also download the MyChart app! Go to the app store, search "MyChart", open the app, select [facility], and log in with your MyChart username and password.  Rehydration, Adult Rehydration is the replacement of fluids, salts, and minerals in the body (electrolytes) that are lost during dehydration. Dehydration is when there is not enough water or other fluids in the body. This happens when you lose more fluids than you take in. Common causes of dehydration include: Not drinking enough fluids. This can occur when you are ill or doing activities that require a lot of energy, especially in hot weather. Conditions that cause loss of water or other fluids. These include diarrhea, vomiting, sweating, and urinating a lot. Other illnesses, such as fever or infection. Certain medicines, such as those that remove  excess fluid from the body (diuretics). Symptoms of mild or moderate dehydration may include thirst, dry lips and mouth, and dizziness. Symptoms of severe dehydration may include increased heart rate, confusion, fainting, and not urinating. In severe cases,  you may need to get fluids through an IV at the hospital. For mild or moderate cases, you can usually rehydrate at home by drinking certain fluids as told by your health care provider. What are the risks? Your health care provider will talk with you about risks. Your health care provider will talk with you about risks. This may include taking in too much fluid (overhydration). This is rare. Overhydration can cause an imbalance of electrolytes in the body, kidney failure, or a decrease in salt (sodium) levels in the body. Supplies needed: You will need an oral rehydration solution (ORS) if your health care provider tells you to use one. This is a drink to treat dehydration. It can be found in pharmacies and retail stores. How to rehydrate Fluids Follow instructions from your health care provider about what to drink. The kind of fluid and the amount you should drink depend on your condition. In general, you should choose drinks that you prefer. If told by your health care provider, drink an ORS. Make an ORS by following instructions on the package. Start by drinking small amounts, about  cup (120 mL) every 5-10 minutes. Slowly increase how much you drink until you have taken in the amount recommended by your health care provider. Drink enough clear fluids to keep your urine pale yellow. If you were told to drink an ORS, finish it first, then start slowly drinking other clear fluids. Drink fluids such as: Water. This includes sparkling and flavored water. Drinking only water can lead to having too little sodium in your body (hyponatremia). Follow the advice of your health care provider. Water from ice chips you suck on. Fruit juice with water added to it (diluted). Sports drinks. Hot or cold herbal teas. Broth-based soups. Milk or milk products. Food Follow instructions from your health care provider about what to eat while you rehydrate. Your health care provider may recommend that you slowly  begin eating regular foods in small amounts. Eat foods that contain a healthy balance of electrolytes, such as bananas, oranges, potatoes, tomatoes, and spinach. Avoid foods that are greasy or contain a lot of sugar. In some cases, you may get nutrition through a feeding tube that is passed through your nose and into your stomach (nasogastric tube, or NG tube). This may be done if you have uncontrolled vomiting or diarrhea. Drinks to avoid  Certain drinks may make dehydration worse. While you rehydrate, avoid drinking alcohol. How to tell if you are recovering from dehydration You may be getting better if: You are urinating more often than before you started rehydrating. Your urine is pale yellow. Your energy level improves. You vomit less often. You have diarrhea less often. Your appetite improves or returns to normal. You feel less dizzy or light-headed. Your skin tone and color start to look more normal. Follow these instructions at home: Take over-the-counter and prescription medicines only as told by your health care provider. Do not take sodium tablets. Doing this can lead to having too much sodium in your body (hypernatremia). Contact a health care provider if: You continue to have symptoms of mild or moderate dehydration, such as: Thirst. Dry lips. Slightly dry mouth. Dizziness. Dark urine or less urine than normal. Muscle cramps. You continue to vomit or have  diarrhea. Get help right away if: You have symptoms of dehydration that get worse. You have a fever. You have a severe headache. You have been vomiting and have problems, such as: Your vomiting gets worse or does not go away. Your vomit includes blood or green matter (bile). You cannot eat or drink without vomiting. You have problems with urination or bowel movements, such as: Diarrhea that gets worse or does not go away. Blood in your stool (feces). This may cause stool to look black and tarry. Not urinating, or  urinating only a small amount of very dark urine, within 6-8 hours. You have trouble breathing. You have symptoms that get worse with treatment. These symptoms may be an emergency. Get help right away. Call 911. Do not wait to see if the symptoms will go away. Do not drive yourself to the hospital. This information is not intended to replace advice given to you by your health care provider. Make sure you discuss any questions you have with your health care provider. Document Revised: 08/23/2021 Document Reviewed: 08/23/2021 Elsevier Patient Education  2024 [employer].

## 2022-11-02 LAB — T4: T4, Total: 5.7 ug/dL (ref 4.5–12.0)

## 2022-11-03 ENCOUNTER — Inpatient Hospital Stay: Payer: Medicaid Other

## 2022-11-03 VITALS — BP 157/99 | Temp 98.3°F | Resp 18

## 2022-11-03 DIAGNOSIS — C3490 Malignant neoplasm of unspecified part of unspecified bronchus or lung: Secondary | ICD-10-CM

## 2022-11-03 DIAGNOSIS — Z5111 Encounter for antineoplastic chemotherapy: Secondary | ICD-10-CM | POA: Diagnosis not present

## 2022-11-03 MED ORDER — PEGFILGRASTIM-CBQV 6 MG/0.6ML ~~LOC~~ SOSY
6.0000 mg | PREFILLED_SYRINGE | Freq: Once | SUBCUTANEOUS | Status: AC
  Administered 2022-11-03: 6 mg via SUBCUTANEOUS
  Filled 2022-11-03: qty 0.6

## 2022-11-15 ENCOUNTER — Ambulatory Visit: Payer: Medicaid Other | Admitting: Physician Assistant

## 2022-11-15 ENCOUNTER — Ambulatory Visit: Payer: Medicaid Other

## 2022-11-15 ENCOUNTER — Other Ambulatory Visit: Payer: Medicaid Other

## 2022-11-17 ENCOUNTER — Ambulatory Visit: Payer: Medicaid Other

## 2022-11-18 ENCOUNTER — Ambulatory Visit (HOSPITAL_COMMUNITY)
Admission: RE | Admit: 2022-11-18 | Discharge: 2022-11-18 | Disposition: A | Discharge status: Home / Self Care | Payer: Medicaid Other | Source: Ambulatory Visit | Attending: Hematology and Oncology | Admitting: Hematology and Oncology

## 2022-11-18 DIAGNOSIS — C3490 Malignant neoplasm of unspecified part of unspecified bronchus or lung: Secondary | ICD-10-CM | POA: Diagnosis present

## 2022-11-18 MED ORDER — IOPAMIDOL (ISOVUE-300) INJECTION 61%: 100.0000 mL | Freq: Once | INTRAVENOUS | Status: DC | PRN

## 2022-11-18 MED ORDER — SODIUM CHLORIDE (PF) 0.9 % IJ SOLN
INTRAMUSCULAR | Status: AC
  Filled 2022-11-18: qty 50

## 2022-11-18 MED ORDER — IOHEXOL 300 MG/ML  SOLN
100.0000 mL | Freq: Once | INTRAMUSCULAR | Status: AC | PRN
  Administered 2022-11-18: 100 mL via INTRAVENOUS

## 2022-11-18 MED FILL — Fosaprepitant Dimeglumine For IV Infusion 150 MG (Base Eq): INTRAVENOUS | Qty: 5 | Status: AC

## 2022-11-18 MED FILL — Dexamethasone Sodium Phosphate Inj 100 MG/10ML: INTRAMUSCULAR | Qty: 1 | Status: AC

## 2022-11-21 ENCOUNTER — Inpatient Hospital Stay: Payer: Medicaid Other | Admitting: Nurse Practitioner

## 2022-11-21 ENCOUNTER — Telehealth: Payer: Self-pay | Admitting: *Deleted

## 2022-11-21 ENCOUNTER — Inpatient Hospital Stay: Payer: Medicaid Other

## 2022-11-21 ENCOUNTER — Inpatient Hospital Stay: Payer: Medicaid Other | Admitting: Hematology and Oncology

## 2022-11-21 ENCOUNTER — Telehealth: Payer: Self-pay | Admitting: Hematology and Oncology

## 2022-11-21 NOTE — Progress Notes (Deleted)
Palliative Medicine [facility]  Telephone:(336) [phone] [phone]) [phone]   Name: Lauren Salazar Date: 11/21/2022 MRN: [id number]  DOB: May 01, 1960  Patient Care Team: [facility] [place] as PCP - General (Family Medicine)   INTERVAL HISTORY: Lauren Salazar is a 62 y.o. 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 Patient is also status post robotic assisted laparoscopic cystectomy and pelvic exoneration including hysterectomy, bilateral oophorectomy and ileoconduit diversion. Palliative ask to see for symptom and pain management and goals of care.   SOCIAL HISTORY:    Lauren Salazar reports that she has been smoking cigarettes. She has never used smokeless tobacco. She reports that she does not drink alcohol and does not use drugs.  ADVANCE DIRECTIVES:  None on file  CODE STATUS: Full code  PAST MEDICAL HISTORY: Past Medical History:  Diagnosis Date   Bladder tumor    Cancer (HCC)    Complication of anesthesia    slow to wake after spinal fusion, then was "looney" for the following 24 hours    Persistent dry cough    ongoing since feb 2020    ALLERGIES:  has No Known Allergies.  MEDICATIONS:  Current Outpatient Medications  Medication Sig Dispense Refill   benzonatate (TESSALON) 100 MG capsule Take 2 capsules (200 mg total) by mouth 3 (three) times daily as needed for cough. 20 capsule 2   diphenoxylate-atropine ([name]) 2.5-0.025 MG tablet Take 1 tablet by mouth 4 (four) times daily as needed for diarrhea or loose stools. 30 tablet 0   folic acid (FOLVITE) 1 MG tablet TAKE 1 TABLET(1 MG) BY MOUTH DAILY 90 tablet 3   HYDROcodone-acetaminophen (NORCO) 5-325 MG tablet Take 1 tablet by mouth every 4 (four) hours as needed for moderate pain. 120 tablet 0   lidocaine (LIDODERM) 5 % Place 1 patch onto the skin daily.  Remove & Discard patch within 12 hours or as directed by MD 30 patch 0   lidocaine-prilocaine (EMLA) cream Apply 1 Application topically as needed. 30 g 0   magic mouthwash (nystatin, diphenhydrAMINE, alum & mag hydroxide) suspension mixture Swish and swallow 10 mLs 3 (three) times daily. 240 mL 2   meloxicam (MOBIC) 15 MG tablet Take 1 tablet (15 mg total) by mouth daily. 30 tablet 0   nicotine (NICODERM CQ - DOSED IN MG/24 HOURS) 21 mg/24hr patch Place 21 mg onto the skin daily. (Patient not taking: Reported on 12/22/2020)     ondansetron (ZOFRAN) 8 MG tablet Take 1 tablet (8 mg total) by mouth every 8 (eight) hours as needed for nausea or vomiting. 20 tablet 1   prochlorperazine (COMPAZINE) 10 MG tablet Take 1 tablet (10 mg total) by mouth every 6 (six) hours as needed for nausea or vomiting. 30 tablet 2   No current facility-administered medications for this visit.    VITAL SIGNS: There were no vitals taken for this visit. There were no vitals filed for this visit.  Estimated body mass index is 18 kg/m as calculated from the following:   Height as of 07/25/22: 5\' 6"  (1.676 m).   Weight as of 11/01/22: 111 lb 8 oz (50.6 kg).   PERFORMANCE STATUS (ECOG) : 1 - Symptomatic but completely ambulatory  Physical Exam: General: NAD Cardiovascular: regular rate and rhythm Pulmonary: regular, even, and unlabored Extremities: no edema, no joint deformities Neurological: alert, oriented x 4, mood appropriate  IMPRESSION:   Neoplasm related pain Lauren Salazar endorses continued back and right side/hip pain. Describes pain as sharp, constant, and throbbing.   Currently taking one hydrocodone every 4-6 hours as needed for pain. Mobic daily. She has discussed with [name], [place] as well as myself given ongoing pain consideration of adding long-acting medication for better control. She expresses concern with taking oral medication twice daily. Education provided on pill organizer, alarm setting, and  taking when she takes other morning and night medications. She mentions difficulty in remembering to taking other medications. Again re-emphasized given expressed level of pain in order to gain some control adherence and schedule will be key to managing. Lauren Salazar mentions possible use of pain patch. Education provided on lidocaine patch for topical relief.   We will plan to continue close monitoring and adjust as needed. She will be started on [place] per discussions with [place], [place].   Encouraged bowel regimen in the setting of opioid use to prevent constipation.    PLAN:  Norco one tablet every 4-6 hours asa needed  Mobic daily. Miralax daily  Lidocaine patch  We will continue to closely monitor and assist with symptom management as needed. Palliative will plan to see patient back in 3-4 weeks in collaboration to other oncology appointments.    Patient expressed understanding and was in agreement with this plan. She also understands that She can call the clinic at any time with any questions, concerns, or complaints.   Any controlled substances utilized were prescribed in the context of palliative care. PDMP has been reviewed.    Visit consisted of counseling and education dealing with the complex and emotionally intense issues of symptom management and palliative care in the setting of serious and potentially life-threatening illness.Greater than 50%  of this time was spent counseling and coordinating care related to the above assessment and plan.   [name], AGPCNP-BC  Palliative Medicine Team/[facility]  *Please note that this is a verbal dictation therefore any spelling or grammatical errors are due to the "Dragon Medical One" system interpretation.

## 2022-11-21 NOTE — Telephone Encounter (Signed)
TCT patient as she has not shown up today. Spoke with her. She states she feels rough, still, after her last chemo. States she is very fatigued, had bad nausea and vomiting and diarrhea after the last treatment. She is hoping for a change in chemo so she doesn't feel so bad.  She prefers to cancel appts for today and have them rescheduled. For next week.  Dr. Leonides Schanz aware. Scheduling message sent

## 2022-11-21 NOTE — Progress Notes (Signed)
Rescheduled per patient request.

## 2022-11-21 NOTE — Telephone Encounter (Signed)
See previous note

## 2022-11-23 ENCOUNTER — Inpatient Hospital Stay: Payer: Medicaid Other

## 2022-11-24 ENCOUNTER — Encounter: Payer: Self-pay | Admitting: Physician Assistant

## 2022-11-24 ENCOUNTER — Encounter: Payer: Self-pay | Admitting: Hematology and Oncology

## 2022-11-25 NOTE — Progress Notes (Unsigned)
Palliative Medicine [facility]  Telephone:([phone] [phone]   Name: Lauren Salazar Date: 11/25/2022 MRN: [id number]  DOB: 1960/07/25  Patient Care Team: [facility] [place] as PCP - General (Family Medicine)   INTERVAL HISTORY: Lauren Salazar is a 62 y.o. 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 Patient is also status post robotic assisted laparoscopic cystectomy and pelvic exoneration including hysterectomy, bilateral oophorectomy and ileoconduit diversion. Palliative ask to see for symptom and pain management and goals of care.   SOCIAL HISTORY:    Lauren Salazar reports that she has been smoking cigarettes. She has never used smokeless tobacco. She reports that she does not drink alcohol and does not use drugs.  ADVANCE DIRECTIVES:  None on file  CODE STATUS: Full code  PAST MEDICAL HISTORY: Past Medical History:  Diagnosis Date   Bladder tumor    Cancer (HCC)    Complication of anesthesia    slow to wake after spinal fusion, then was "looney" for the following 24 hours    Persistent dry cough    ongoing since feb 2020    ALLERGIES:  has No Known Allergies.  MEDICATIONS:  Current Outpatient Medications  Medication Sig Dispense Refill   benzonatate (TESSALON) 100 MG capsule Take 2 capsules (200 mg total) by mouth 3 (three) times daily as needed for cough. 20 capsule 2   diphenoxylate-atropine ([name]) 2.5-0.025 MG tablet Take 1 tablet by mouth 4 (four) times daily as needed for diarrhea or loose stools. 30 tablet 0   folic acid (FOLVITE) 1 MG tablet TAKE 1 TABLET(1 MG) BY MOUTH DAILY 90 tablet 3   HYDROcodone-acetaminophen (NORCO) 5-325 MG tablet Take 1 tablet by mouth every 4 (four) hours as needed for moderate pain. 120 tablet 0   lidocaine (LIDODERM) 5 % Place 1 patch onto the skin daily.  Remove & Discard patch within 12 hours or as directed by MD 30 patch 0   lidocaine-prilocaine (EMLA) cream Apply 1 Application topically as needed. 30 g 0   magic mouthwash (nystatin, diphenhydrAMINE, alum & mag hydroxide) suspension mixture Swish and swallow 10 mLs 3 (three) times daily. 240 mL 2   meloxicam (MOBIC) 15 MG tablet Take 1 tablet (15 mg total) by mouth daily. 30 tablet 0   nicotine (NICODERM CQ - DOSED IN MG/24 HOURS) 21 mg/24hr patch Place 21 mg onto the skin daily. (Patient not taking: Reported on 12/22/2020)     ondansetron (ZOFRAN) 8 MG tablet Take 1 tablet (8 mg total) by mouth every 8 (eight) hours as needed for nausea or vomiting. 20 tablet 1   prochlorperazine (COMPAZINE) 10 MG tablet Take 1 tablet (10 mg total) by mouth every 6 (six) hours as needed for nausea or vomiting. 30 tablet 2   No current facility-administered medications for this visit.    VITAL SIGNS: There were no vitals taken for this visit. There were no vitals filed for this visit.  Estimated body mass index is 18 kg/m as calculated from the following:   Height as of 07/25/22: 5\' 6"  (1.676 m).   Weight as of 11/01/22: 111 lb 8 oz (50.6 kg).   PERFORMANCE STATUS (ECOG) : 1 - Symptomatic but completely ambulatory  Physical Exam: General: NAD, resting comfortably in recliner Cardiovascular: regular rate and rhythm Pulmonary: regular, even, and unlabored Extremities: no edema, no joint deformities Neurological: alert, oriented  x 4, mood appropriate   IMPRESSION:  I saw Lauren Salazar during her infusion.  No acute distress noted.  She is resting comfortably in recliner.  Tolerating treatment well.  Denies nausea, vomiting, constipation, or diarrhea.  Reports ongoing fatigue over the past 2-3 weeks s/p last treatment.  States she felt previous treatment really "knocked her down".  Feels she was most likely dehydrated due to her fatigue as well as decreased appetite.  Also experienced some diarrhea and nausea  during those weeks.  Is appreciative feeling much better.  Reports due to her response to her treatment is being dose reduced today and she is hopeful for a better response this around.  Neoplasm related pain Ms. Plott reports ongoing pain however feels it is manageable.  She is taking things 1 day at a time.  Does not like taking pain medication as she does not prefer to be drowsy.  She was prescribed Xtampza for long-acting pain relief however has not taking.   Currently taking one hydrocodone every 4-6 hours as needed for pain. Mobic daily.  Lidocaine patch to back.  No adjustments at this time.  We will plan to continue close monitoring and adjust as needed. Patient knows to contact our office with any needs.   PLAN:  Norco one tablet every 4-6 hours asa needed  Mobic daily. Miralax daily  Lidocaine patch  We will continue to closely monitor and assist with symptom management as needed. Palliative will plan to see patient back in 3-4 weeks in collaboration to other oncology appointments.    Patient expressed understanding and was in agreement with this plan. She also understands that She can call the clinic at any time with any questions, concerns, or complaints.   Any controlled substances utilized were prescribed in the context of palliative care. PDMP has been reviewed.    Visit consisted of counseling and education dealing with the complex and emotionally intense issues of symptom management and palliative care in the setting of serious and potentially life-threatening illness.Greater than 50%  of this time was spent counseling and coordinating care related to the above assessment and plan.   Lauren Salazar, AGPCNP-BC  Palliative Medicine Team/[facility]  *Please note that this is a verbal dictation therefore any spelling or grammatical errors are due to the "Dragon Medical One" system interpretation.

## 2022-11-28 ENCOUNTER — Other Ambulatory Visit: Payer: Self-pay

## 2022-11-28 DIAGNOSIS — C3431 Malignant neoplasm of lower lobe, right bronchus or lung: Secondary | ICD-10-CM

## 2022-11-28 DIAGNOSIS — G893 Neoplasm related pain (acute) (chronic): Secondary | ICD-10-CM

## 2022-11-28 DIAGNOSIS — Z515 Encounter for palliative care: Secondary | ICD-10-CM

## 2022-11-28 MED ORDER — MELOXICAM 15 MG PO TABS: 15.0000 mg | ORAL_TABLET | Freq: Every day | ORAL | 0 refills | Status: DC

## 2022-11-28 MED FILL — Fosaprepitant Dimeglumine For IV Infusion 150 MG (Base Eq): INTRAVENOUS | Qty: 5 | Status: AC

## 2022-11-28 MED FILL — Dexamethasone Sodium Phosphate Inj 100 MG/10ML: INTRAMUSCULAR | Qty: 1 | Status: AC

## 2022-11-29 ENCOUNTER — Inpatient Hospital Stay: Payer: Medicaid Other

## 2022-11-29 ENCOUNTER — Inpatient Hospital Stay (HOSPITAL_BASED_OUTPATIENT_CLINIC_OR_DEPARTMENT_OTHER): Payer: Medicaid Other | Admitting: Nurse Practitioner

## 2022-11-29 ENCOUNTER — Encounter: Payer: Self-pay | Admitting: Nurse Practitioner

## 2022-11-29 ENCOUNTER — Inpatient Hospital Stay (HOSPITAL_BASED_OUTPATIENT_CLINIC_OR_DEPARTMENT_OTHER): Payer: Medicaid Other | Admitting: Physician Assistant

## 2022-11-29 ENCOUNTER — Inpatient Hospital Stay: Payer: Medicaid Other | Attending: Oncology

## 2022-11-29 ENCOUNTER — Other Ambulatory Visit: Payer: Self-pay

## 2022-11-29 VITALS — BP 160/89 | HR 116 | Temp 98.1°F | Resp 17 | Wt 111.9 lb

## 2022-11-29 VITALS — BP 139/92

## 2022-11-29 DIAGNOSIS — Z5111 Encounter for antineoplastic chemotherapy: Secondary | ICD-10-CM | POA: Insufficient documentation

## 2022-11-29 DIAGNOSIS — K5730 Diverticulosis of large intestine without perforation or abscess without bleeding: Secondary | ICD-10-CM | POA: Insufficient documentation

## 2022-11-29 DIAGNOSIS — R5383 Other fatigue: Secondary | ICD-10-CM | POA: Diagnosis not present

## 2022-11-29 DIAGNOSIS — Z79899 Other long term (current) drug therapy: Secondary | ICD-10-CM | POA: Diagnosis not present

## 2022-11-29 DIAGNOSIS — R110 Nausea: Secondary | ICD-10-CM | POA: Diagnosis not present

## 2022-11-29 DIAGNOSIS — R530 Neoplastic (malignant) related fatigue: Secondary | ICD-10-CM

## 2022-11-29 DIAGNOSIS — F17210 Nicotine dependence, cigarettes, uncomplicated: Secondary | ICD-10-CM | POA: Diagnosis not present

## 2022-11-29 DIAGNOSIS — C3431 Malignant neoplasm of lower lobe, right bronchus or lung: Secondary | ICD-10-CM | POA: Insufficient documentation

## 2022-11-29 DIAGNOSIS — Z791 Long term (current) use of non-steroidal anti-inflammatories (NSAID): Secondary | ICD-10-CM | POA: Diagnosis not present

## 2022-11-29 DIAGNOSIS — J432 Centrilobular emphysema: Secondary | ICD-10-CM | POA: Diagnosis not present

## 2022-11-29 DIAGNOSIS — Z95828 Presence of other vascular implants and grafts: Secondary | ICD-10-CM

## 2022-11-29 DIAGNOSIS — I700 Atherosclerosis of aorta: Secondary | ICD-10-CM | POA: Diagnosis not present

## 2022-11-29 DIAGNOSIS — C3490 Malignant neoplasm of unspecified part of unspecified bronchus or lung: Secondary | ICD-10-CM

## 2022-11-29 DIAGNOSIS — Z515 Encounter for palliative care: Secondary | ICD-10-CM | POA: Diagnosis not present

## 2022-11-29 DIAGNOSIS — Z923 Personal history of irradiation: Secondary | ICD-10-CM | POA: Insufficient documentation

## 2022-11-29 DIAGNOSIS — I2510 Atherosclerotic heart disease of native coronary artery without angina pectoris: Secondary | ICD-10-CM | POA: Diagnosis not present

## 2022-11-29 DIAGNOSIS — E860 Dehydration: Secondary | ICD-10-CM

## 2022-11-29 DIAGNOSIS — Z5189 Encounter for other specified aftercare: Secondary | ICD-10-CM | POA: Insufficient documentation

## 2022-11-29 DIAGNOSIS — G893 Neoplasm related pain (acute) (chronic): Secondary | ICD-10-CM

## 2022-11-29 DIAGNOSIS — C7951 Secondary malignant neoplasm of bone: Secondary | ICD-10-CM | POA: Diagnosis not present

## 2022-11-29 DIAGNOSIS — R109 Unspecified abdominal pain: Secondary | ICD-10-CM | POA: Insufficient documentation

## 2022-11-29 DIAGNOSIS — R197 Diarrhea, unspecified: Secondary | ICD-10-CM | POA: Diagnosis not present

## 2022-11-29 LAB — CBC WITH DIFFERENTIAL (CANCER CENTER ONLY)
Abs Immature Granulocytes: 0.02 10*3/uL (ref 0.00–0.07)
Basophils Absolute: 0.1 10*3/uL (ref 0.0–0.1)
Basophils Relative: 1 %
Eosinophils Absolute: 0.1 10*3/uL (ref 0.0–0.5)
Eosinophils Relative: 1 %
HCT: 39.8 % (ref 36.0–46.0)
Hemoglobin: 12.9 g/dL (ref 12.0–15.0)
Immature Granulocytes: 0 %
Lymphocytes Relative: 9 %
Lymphs Abs: 0.8 10*3/uL (ref 0.7–4.0)
MCH: 29.9 pg (ref 26.0–34.0)
MCHC: 32.4 g/dL (ref 30.0–36.0)
MCV: 92.3 fL (ref 80.0–100.0)
Monocytes Absolute: 0.7 10*3/uL (ref 0.1–1.0)
Monocytes Relative: 8 %
Neutro Abs: 6.9 10*3/uL (ref 1.7–7.7)
Neutrophils Relative %: 81 %
Platelet Count: 252 10*3/uL (ref 150–400)
RBC: 4.31 MIL/uL (ref 3.87–5.11)
RDW: 17.8 % — ABNORMAL HIGH (ref 11.5–15.5)
WBC Count: 8.6 10*3/uL (ref 4.0–10.5)
nRBC: 0 % (ref 0.0–0.2)

## 2022-11-29 LAB — CMP (CANCER CENTER ONLY)
ALT: 6 U/L (ref 0–44)
AST: 12 U/L — ABNORMAL LOW (ref 15–41)
Albumin: 3.4 g/dL — ABNORMAL LOW (ref 3.5–5.0)
Alkaline Phosphatase: 78 U/L (ref 38–126)
Anion gap: 8 (ref 5–15)
BUN: 27 mg/dL — ABNORMAL HIGH (ref 8–23)
CO2: 28 mmol/L (ref 22–32)
Calcium: 8.4 mg/dL — ABNORMAL LOW (ref 8.9–10.3)
Chloride: 104 mmol/L (ref 98–111)
Creatinine: 1.22 mg/dL — ABNORMAL HIGH (ref 0.44–1.00)
GFR, Estimated: 50 mL/min — ABNORMAL LOW (ref >60)
Glucose, Bld: 167 mg/dL — ABNORMAL HIGH (ref 70–99)
Potassium: 4.3 mmol/L (ref 3.5–5.1)
Sodium: 140 mmol/L (ref 135–145)
Total Bilirubin: 0.3 mg/dL (ref 0.3–1.2)
Total Protein: 6.6 g/dL (ref 6.5–8.1)

## 2022-11-29 LAB — MAGNESIUM: Magnesium: 1.7 mg/dL (ref 1.7–2.4)

## 2022-11-29 LAB — TSH: TSH: 9.162 u[IU]/mL — ABNORMAL HIGH (ref 0.350–4.500)

## 2022-11-29 LAB — TOTAL PROTEIN, URINE DIPSTICK: Protein, ur: NEGATIVE mg/dL

## 2022-11-29 MED ORDER — KETOROLAC TROMETHAMINE 30 MG/ML IJ SOLN
30.0000 mg | Freq: Once | INTRAMUSCULAR | Status: AC
  Administered 2022-11-29: 30 mg via INTRAVENOUS
  Filled 2022-11-29: qty 1

## 2022-11-29 MED ORDER — SODIUM CHLORIDE 0.9 % IV SOLN
10.0000 mg | Freq: Once | INTRAVENOUS | Status: AC
  Administered 2022-11-29: 10 mg via INTRAVENOUS
  Filled 2022-11-29: qty 10
  Filled 2022-11-29: qty 1

## 2022-11-29 MED ORDER — SODIUM CHLORIDE 0.9 % IV SOLN
150.0000 mg | Freq: Once | INTRAVENOUS | Status: AC
  Administered 2022-11-29: 150 mg via INTRAVENOUS
  Filled 2022-11-29: qty 150
  Filled 2022-11-29: qty 5

## 2022-11-29 MED ORDER — SODIUM CHLORIDE 0.9% FLUSH
10.0000 mL | INTRAVENOUS | Status: DC | PRN
  Administered 2022-11-29: 10 mL

## 2022-11-29 MED ORDER — DIPHENHYDRAMINE HCL 50 MG/ML IJ SOLN
50.0000 mg | Freq: Once | INTRAMUSCULAR | Status: AC
  Administered 2022-11-29: 50 mg via INTRAVENOUS
  Filled 2022-11-29: qty 1

## 2022-11-29 MED ORDER — SODIUM CHLORIDE 0.9 % IV SOLN: Freq: Once | INTRAVENOUS | Status: AC

## 2022-11-29 MED ORDER — SODIUM CHLORIDE 0.9 % IV SOLN
10.0000 mg/kg | Freq: Once | INTRAVENOUS | Status: AC
  Administered 2022-11-29: 500 mg via INTRAVENOUS
  Filled 2022-11-29: qty 50

## 2022-11-29 MED ORDER — SODIUM CHLORIDE 0.9 % IV SOLN: INTRAVENOUS | Status: AC

## 2022-11-29 MED ORDER — ACETAMINOPHEN 325 MG PO TABS
650.0000 mg | ORAL_TABLET | Freq: Once | ORAL | Status: AC
  Administered 2022-11-29: 650 mg via ORAL
  Filled 2022-11-29: qty 2

## 2022-11-29 MED ORDER — SODIUM CHLORIDE 0.9 % IV SOLN
50.0000 mg/m2 | Freq: Once | INTRAVENOUS | Status: AC
  Administered 2022-11-29: 80 mg via INTRAVENOUS
  Filled 2022-11-29: qty 8

## 2022-11-29 NOTE — Progress Notes (Addendum)
[facility] [phone]   [phone]  PROGRESS NOTE  Patient Care Team: [facility] [place] as PCP - General (Family Medicine)  Hematological/Oncological History # Metastatic Adenosquamous Lung Cancer  05/10/2022: last visit with Dr. Clelia Croft 05/26/2022: CT scan showed enlargement of RLL mass, right iliac crest mass with large soft tissue components extending in the iliacus and gluteus musculature. Additionally new pathologic right external iliac adenopathy.  06/07/2022: transition care to Dr. Leonides Schanz  07/12/2022: Cycle 1 Day 1 of docetaxel and ramucirumab 08/02/2022: Cycle 2 Day 1 of docetaxel and ramucirumab (dose reduced docetaxel from 75 mg/m2 to 60 mg/m2 due to poor tolerance) 08/23/2022: Cycle 3 Day 1 of docetaxel and ramucirumab 10/04/2022: Cycle 4 Day 1 of docetaxel and ramucirumab 11/01/2022: Cycle 5 Day 1 of docetaxel and ramucirumab 11/29/2022: Cycle 6 Day 1 of docetaxel and ramucirumab (dose reduced docetaxel from 60 mg/m2 to 50 mg/m2 due to persistent fatigue)  Interval History:  Lauren Salazar 62 y.o. female with medical history significant for metastatic adenosquamous lung cancer presents for a follow up visit. The patient's last visit was on 11/01/2022. She presents today for a follow up before Cycle 6, Day 1  [name] reports she had fatigue lasting 2-3 weeks after her last treatment. She adds that she felt dehydrated as her appetite and PO intake were decreased. She has occasional episodes of nausea starting 3rd day after treatment last approximately one week. She adds that her prescribed antiemetics improve her nausea. She reports intermittent episodes of diarrhea lasting 2 days after treatment which resolves on its own. She denies easy bruising or signs of active bleeding. She experienced bilateral flank pain and dark urine for 3 days after this last cycle which resolved on its own. She denies fevers, chills, sweats,  shortness of breath, chest, headaches or dizziness. She has no other complaints. Rest of the 10 point ROS was otherwise negative.  She notes that she is willing and able to proceed with chemotherapy treatment today.  MEDICAL HISTORY:  Past Medical History:  Diagnosis Date   Bladder tumor    Cancer (HCC)    Complication of anesthesia    slow to wake after spinal fusion, then was "looney" for the following 24 hours    Persistent dry cough    ongoing since feb 2020    SURGICAL HISTORY: Past Surgical History:  Procedure Laterality Date   CERVICAL FUSION  2005   c5-c6      CYSTOSCOPY WITH INJECTION N/A 02/01/2019   Procedure: CYSTOSCOPY WITH INJECTION;  Surgeon: Sebastian Ache, MD;  Location: [facility];  Service: Urology;  Laterality: N/A;   IR IMAGING GUIDED PORT INSERTION  07/25/2022   LYMPHADENECTOMY Bilateral 02/01/2019   Procedure: LYMPHADENECTOMY;  Surgeon: Sebastian Ache, MD;  Location: [facility];  Service: Urology;  Laterality: Bilateral;   ROBOT ASSISTED LAPAROSCOPIC COMPLETE CYSTECT ILEAL CONDUIT N/A 02/01/2019   Procedure: XI ROBOTIC ASSISTED LAPAROSCOPIC COMPLETE CYSTECTOMY, ILEAL CONDUIT, HYSTERECTOMY WITH BILATERAL SALPINGOOPHORECTOMY;  Surgeon: Sebastian Ache, MD;  Location: [facility];  Service: Urology;  Laterality: N/A;  6 HRS   TRANSURETHRAL RESECTION OF BLADDER TUMOR N/A 10/31/2018   Procedure: TRANSURETHRAL RESECTION OF BLADDER TUMOR (TURBT) WITH CYSTOSCOPY/ POSSIBLE INTRAVESICAL GEMCITABINE;  Surgeon: Rene Paci, MD;  Location: [facility];  Service: Urology;  Laterality: N/A;    SOCIAL HISTORY: Social History   Socioeconomic History   Marital status: Divorced    Spouse name: Not on file   Number of children: Not on  file   Years of education: Not on file   Highest education level: Not on file  Occupational History   Not on file  Tobacco Use   Smoking status: Every Day    Current packs/day: 1.00    Types: Cigarettes   Smokeless tobacco: Never   Tobacco  comments:    smoking since age 47 , she is cutting back, using patches, trying to stop. 04/02/19  Vaping Use   Vaping status: Never Used  Substance and Sexual Activity   Alcohol use: No   Drug use: No   Sexual activity: Not Currently  Other Topics Concern   Not on file  Social History Narrative   Not on file   Social Determinants of Health   Financial Resource Strain: Not on file  Food Insecurity: Not on file  Transportation Needs: No Transportation Needs (04/02/2019)   PRAPARE - Administrator, Civil Service (Medical): No    Lack of Transportation (Non-Medical): No  Physical Activity: Not on file  Stress: Not on file  Social Connections: Not on file  Intimate Partner Violence: Not At Risk (04/02/2019)   Humiliation, Afraid, Rape, and Kick questionnaire    Fear of Current or Ex-Partner: No    Emotionally Abused: No    Physically Abused: No    Sexually Abused: No    FAMILY HISTORY: No family history on file.  ALLERGIES:  is allergic to celecoxib.  MEDICATIONS:  Current Outpatient Medications  Medication Sig Dispense Refill   acetaminophen (TYLENOL) 500 MG tablet 1 tablet as needed Orally every 6 hrs     benzonatate (TESSALON) 100 MG capsule Take 2 capsules (200 mg total) by mouth 3 (three) times daily as needed for cough. 20 capsule 2   diphenoxylate-atropine ([name]) 2.5-0.025 MG tablet Take 1 tablet by mouth 4 (four) times daily as needed for diarrhea or loose stools. 30 tablet 0   folic acid (FOLVITE) 1 MG tablet TAKE 1 TABLET(1 MG) BY MOUTH DAILY 90 tablet 3   HYDROcodone-acetaminophen (NORCO) 5-325 MG tablet Take 1 tablet by mouth every 4 (four) hours as needed for moderate pain. 120 tablet 0   lidocaine (LIDODERM) 5 % Place 1 patch onto the skin daily. Remove & Discard patch within 12 hours or as directed by MD 30 patch 0   magic mouthwash (nystatin, diphenhydrAMINE, alum & mag hydroxide) suspension mixture Swish and swallow 10 mLs 3 (three) times daily.  240 mL 2   meloxicam (MOBIC) 15 MG tablet Take 1 tablet (15 mg total) by mouth daily. 30 tablet 0   ondansetron (ZOFRAN) 8 MG tablet Take 1 tablet (8 mg total) by mouth every 8 (eight) hours as needed for nausea or vomiting. 20 tablet 1   prochlorperazine (COMPAZINE) 10 MG tablet Take 1 tablet (10 mg total) by mouth every 6 (six) hours as needed for nausea or vomiting. 30 tablet 2   lidocaine-prilocaine (EMLA) cream Apply 1 Application topically as needed. (Patient not taking: Reported on 11/29/2022) 30 g 0   nicotine (NICODERM CQ - DOSED IN MG/24 HOURS) 21 mg/24hr patch Place 21 mg onto the skin daily. (Patient not taking: Reported on 12/22/2020)     No current facility-administered medications for this visit.    REVIEW OF SYSTEMS:   Constitutional: ( - ) fevers, ( - )  chills , ( - ) night sweats Eyes: ( - ) blurriness of vision, ( - ) double vision, ( - ) watery eyes Ears, nose, mouth, throat, and face: ( - )  mucositis, ( - ) sore throat Respiratory: (+ ) cough, ( - ) dyspnea, ( - ) wheezes Cardiovascular: ( - ) palpitation, ( - ) chest discomfort, ( - ) lower extremity swelling Gastrointestinal:  ( + ) nausea, ( - ) heartburn, ( +) change in bowel habits Skin: ( - ) abnormal skin rashes Lymphatics: ( - ) new lymphadenopathy, ( - ) easy bruising Neurological: ( - ) numbness, ( - ) tingling, ( - ) new weaknesses Behavioral/Psych: ( - ) mood change, ( - ) new changes  All other systems were reviewed with the patient and are negative.  PHYSICAL EXAMINATION: ECOG PERFORMANCE STATUS: 1 - Symptomatic but completely ambulatory  Vitals:   11/29/22 0931  BP: (!) 160/89  Pulse: (!) 116  Resp: 17  Temp: 98.1 F (36.7 C)  SpO2: 98%    Filed Weights   11/29/22 0931  Weight: 111 lb 14.4 oz (50.8 kg)   GENERAL: Chronically ill-appearing middle-aged Caucasian female, alert, no distress and comfortable SKIN: skin color, texture, turgor are normal, no rashes or significant lesions EYES:  conjunctiva are pink and non-injected, sclera clear LUNGS: clear to auscultation and percussion with normal breathing effort HEART: regular rate & rhythm and no murmurs and no lower extremity edema Musculoskeletal: no cyanosis of digits and no clubbing  PSYCH: alert & oriented x 3, fluent speech NEURO: no focal motor/sensory deficits  LABORATORY DATA:  I have reviewed the data as listed    Latest Ref Rng & Units 11/29/2022    9:05 AM 11/01/2022   11:13 AM 10/04/2022   12:39 PM  CBC  WBC 4.0 - 10.5 K/uL 8.6  6.8  8.5   Hemoglobin 12.0 - 15.0 g/dL 30.8  65.7  84.6   Hematocrit 36.0 - 46.0 % 39.8  42.7  38.4   Platelets 150 - 400 K/uL 252  237  273        Latest Ref Rng & Units 11/01/2022   11:13 AM 10/04/2022   12:39 PM 08/23/2022   10:42 AM  CMP  Glucose 70 - 99 mg/dL 81  962  952   BUN 8 - 23 mg/dL 22  17  14    Creatinine 0.44 - 1.00 mg/dL 8.41  3.24  4.01   Sodium 135 - 145 mmol/L 140  141  136   Potassium 3.5 - 5.1 mmol/L 4.5  4.3  4.2   Chloride 98 - 111 mmol/L 108  108  102   CO2 22 - 32 mmol/L 26  26  28    Calcium 8.9 - 10.3 mg/dL 9.3  9.2  9.2   Total Protein 6.5 - 8.1 g/dL 7.1  7.1  6.9   Total Bilirubin 0.3 - 1.2 mg/dL 0.2  0.3  0.2   Alkaline Phos 38 - 126 U/L 83  109  86   AST 15 - 41 U/L 14  15  15    ALT 0 - 44 U/L 7  8  7      RADIOGRAPHIC STUDIES: CT CHEST ABDOMEN PELVIS W CONTRAST  Result Date: 11/19/2022 CLINICAL DATA:  Metastatic non-small cell lung cancer restaging * Tracking Code: BO * EXAM: CT CHEST, ABDOMEN, AND PELVIS WITH CONTRAST TECHNIQUE: Multidetector CT imaging of the chest, abdomen and pelvis was performed following the standard protocol during bolus administration of intravenous contrast. RADIATION DOSE REDUCTION: This exam was performed according to the departmental dose-optimization program which includes automated exposure control, adjustment of the mA and/or kV according to patient size and/or use  of iterative reconstruction technique. CONTRAST:   OMNIPAQUE IOHEXOL 300 MG/ML  SOLN COMPARISON:  09/09/2022 FINDINGS: CT CHEST FINDINGS Cardiovascular: Aortic atherosclerosis. Right chest port catheter. Normal heart size. Left coronary artery calcifications. No pericardial effusion. Mediastinum/Nodes: No enlarged mediastinal, hilar, or axillary lymph nodes. Thyroid gland, trachea, and esophagus demonstrate no significant findings. Lungs/Pleura: Unchanged mass of the dependent right lower lobe measuring 5.8 x 3.8 cm (series 7, image 87). Severe centrilobular and paraseptal emphysema. Diffuse bilateral bronchial wall thickening. Unchanged trace, loculated right pleural effusion. Musculoskeletal: No chest wall abnormality. No acute osseous findings. CT ABDOMEN PELVIS FINDINGS Hepatobiliary: No solid liver abnormality is seen. No gallstones, gallbladder wall thickening, or biliary dilatation. Pancreas: Unremarkable. No pancreatic ductal dilatation or surrounding inflammatory changes. Spleen: Normal in size without significant abnormality. Adrenals/Urinary Tract: Diminished size of a right adrenal nodule, measuring 1.6 x 1.3 cm, previously 2.4 x 1.8 cm (series 2, image 62). Unchanged left adrenal nodule measuring 1.8 x 1.5 cm (series 2, image 64). Status post cystectomy with right lower quadrant ileal conduit urinary diversion. No hydronephrosis. Stomach/Bowel: Stomach is within normal limits. No evidence of bowel wall thickening, distention, or inflammatory changes. Sigmoid diverticulosis. Vascular/Lymphatic: Severe aortic atherosclerosis. Unchanged proximal atherosclerotic occlusion of the right common iliac artery (series 2, image 85). Interval resolution of previously seen enlarged left retroperitoneal lymph nodes, now significantly subcentimeter (series 2, image 70). Reproductive: No mass or other abnormality. Other: Unchanged midline ventral hernia containing nonobstructed small bowel and colon. Right lower quadrant ileal conduit urinary diversion. No  ascites. Musculoskeletal: No acute osseous findings. Increased sclerosis of a large mixed lytic and sclerotic metastatic lesion of the right ilium with an associated soft tissue mass measuring 8.4 x 3.5 cm, unchanged in size (series 2, image 88). IMPRESSION: 1. Unchanged mass of the dependent right lower lobe measuring 5.8 x 3.8 cm. 2. Diminished size of a right adrenal nodule. Unchanged left adrenal nodule. 3. Resolution of previously seen enlarged left retroperitoneal lymph nodes, now significantly subcentimeter. 4. Increased sclerosis of a large mixed lytic and sclerotic metastatic lesion of the right ilium with an associated soft tissue mass measuring 8.4 x 3.5 cm, unchanged in size. 5. Constellation of findings is consistent with treatment response of adrenal and nodal metastatic disease, as well as progressing post treatment sclerosis of osseous metastasis. 6. No new evidence of metastatic disease in the chest, abdomen, or pelvis. 7. Status post cystectomy with right lower quadrant ileal conduit urinary diversion. 8. Emphysema. 9. Coronary artery disease Aortic Atherosclerosis (ICD10-I70.0) and Emphysema (ICD10-J43.9). Electronically Signed   By: Jearld Lesch M.D.   On: 11/19/2022 15:48    ASSESSMENT & PLAN Lauren Salazar is a 62 y.o. female with medical history significant for metastatic adenosquamous lung cancer presents for a follow up visit.  She is status post robotic assisted laparoscopic cystectomy and pelvic exoneration including hysterectomy, bilateral salpingo-oophorectomy and ileoconduit diversion completed on February 01, 2019.  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   Radiation therapy with weekly chemotherapy utilizing carboplatin and paclitaxel.  Week 1 of chemotherapy started on May 14, 2019.  Therapy concluded in February 2021.   Durvalumab 10 mg/kg cycle 1 on Sep 03, 2019 every 14 days.  She completed 4 cycles of therapy.   Carboplatin  with Alimta and Pembrolizumab started on August 04, 2020.  She completed 3 cycles of therapy in June 2022.   Pembrolizumab 400 mg every 6 weeks started on December 22, 2020.  Therapy  discontinued and March 2023 due to patient's preference.  Progression noted on CT scan from 05/26/2022.   Completed palliative radiation to her R hip/pelvis on 07/01/2022. Received 30 Gy in 10 Fx.   Started Docetaxel and Ramucirumab on 07/12/2022.  # Metastatic Adenosquamous Carcinoma of the Lung  --patient has undergone numerous lines of treatment including chemoradiation followed by immunotherapy, subsequent carbo/pem/pem and then monotherapy pembrolizumab --progression of disease noted on 05/26/2022 CT scan. --Started docetaxel plus ramucirumab on 07/12/2022 --Due to poor tolerance with Cycle 1, dose reduced Doxcetaxel from 75 mg/m2 to 60 mg/m2 starting Cycle 2.  Plan: -- today is Cycle 6 Day 1 of Docetaxel/Ramucircumab -- labs today show white blood cell 8.6, Hgb 12.9, MCV 92.3, Plt 252. Creatinine increased to 1.22 today. LFTs in range.  --Review most recent CT scan from 11/18/2022 continued treatment response with stable RLL lung mass, decrease in size of right adrenal nodule, left retroperitoneal lymph node and sclerosis of bone metastases. No evidence of new or progressive disease.  --Patient will receive 1 L of IV fluids today.  --Due to persistent fatigue with the last cycle, we will dose reduce docetaxel from 60 mg/m2 to 50 mg/m2.  --RTC in 3 weeks with labs, follow up before Cycle 7, Day 1 Docetaxel/Ramucircumab.  # Abdominal Pain/ Cancer Related Pain --Prescribed oxycodone 5 mg as needed, however patient avoids taking this as it causes her to be "loopy". --Patient completed palliative radiation with radiation oncology to right hip/pelvis with improvement of pain --Patient is c/o right sided mid back pain/hip pain. Currently take Norco 5-325 mg nightly to help her sleep but pain relief is short term.   --continue long acting pain medication with Xtampza 13.5 mg q 12 hours.   #Nausea: --Added IV emend to her pre-meds --Continue to take zofran and compazine as needed.   #Diarrhea: --Secondary to chemotherapy --Continue to take imodium as needed.   #Joint pain 2/2 GCSF injection: --Recommend to take Claritin for several days with injection to minimize side effects.    No orders of the defined types were placed in this encounter.   All questions were answered. The patient knows to call the clinic with any problems, questions or concerns.  A total of more than 30 minutes were spent on this encounter with face-to-face time and non-face-to-face time, including preparing to see the patient, ordering tests and/or medications, counseling the patient and coordination of care as outlined above.   Georga Kaufmann PA-C Dept of Hematology and Oncology [facility] Phone: [phone]     11/29/2022 9:37 AM

## 2022-11-29 NOTE — Patient Instructions (Signed)
[place] [facility]  Discharge Instructions: Thank you for choosing [facility] to provide your oncology and hematology care.   If you have a lab appointment with the Cancer Center, please go directly to the Cancer Center and check in at the registration area.   Wear comfortable clothing and clothing appropriate for easy access to any Portacath or PICC line.   We strive to give you quality time with your provider. You may need to reschedule your appointment if you arrive late (15 or more minutes).  Arriving late affects you and other patients whose appointments are after yours.  Also, if you miss three or more appointments without notifying the office, you may be dismissed from the clinic at the provider's discretion.      For prescription refill requests, have your pharmacy contact our office and allow 72 hours for refills to be completed.    Today you received the following chemotherapy and/or immunotherapy agents docetaxel, cyramza      To help prevent nausea and vomiting after your treatment, we encourage you to take your nausea medication as directed.  BELOW ARE SYMPTOMS THAT SHOULD BE REPORTED IMMEDIATELY: *FEVER GREATER THAN 100.4 F (38 C) OR HIGHER *CHILLS OR SWEATING *NAUSEA AND VOMITING THAT IS NOT CONTROLLED WITH YOUR NAUSEA MEDICATION *UNUSUAL SHORTNESS OF BREATH *UNUSUAL BRUISING OR BLEEDING *URINARY PROBLEMS (pain or burning when urinating, or frequent urination) *BOWEL PROBLEMS (unusual diarrhea, constipation, pain near the anus) TENDERNESS IN MOUTH AND THROAT WITH OR WITHOUT PRESENCE OF ULCERS (sore throat, sores in mouth, or a toothache) UNUSUAL RASH, SWELLING OR PAIN  UNUSUAL VAGINAL DISCHARGE OR ITCHING   Items with * indicate a potential emergency and should be followed up as soon as possible or go to the Emergency Department if any problems should occur.  Please show the CHEMOTHERAPY ALERT CARD or IMMUNOTHERAPY ALERT CARD  at check-in to the Emergency Department and triage nurse.  Should you have questions after your visit or need to cancel or reschedule your appointment, please contact [place] [facility]  Dept: [phone]  and follow the prompts.  Office hours are 8:00 a.m. to 4:30 p.m. Monday - Friday. Please note that voicemails left after 4:00 p.m. may not be returned until the following business day.  We are closed weekends and major holidays. You have access to a nurse at all times for urgent questions. Please call the main number to the clinic Dept: [phone] and follow the prompts.   For any non-urgent questions, you may also contact your provider using MyChart. We now offer e-Visits for anyone 29 and older to request care online for non-urgent symptoms. For details visit [email or web address].   Also download the MyChart app! Go to the app store, search "MyChart", open the app, select , and log in with your MyChart username and password.

## 2022-11-29 NOTE — Progress Notes (Signed)
Per Dede Query, PA, ok to treat with elevated heart rate.  ?

## 2022-12-01 ENCOUNTER — Inpatient Hospital Stay: Payer: Medicaid Other

## 2022-12-02 ENCOUNTER — Telehealth: Payer: Self-pay | Admitting: *Deleted

## 2022-12-02 NOTE — Telephone Encounter (Signed)
-----   Message from Ulysees Barns IV sent at 12/02/2022  8:52 AM EDT ----- Please let [name] know that her CT scan shows stable disease. No new or worsening areas of disease. Her therapy appears to be working well. We will continue with her current regimen. We will see her back later this month. ----- Message ----- From: Interface, Rad Results In Sent: 11/19/2022   3:51 PM EDT To: Jaci Standard, MD

## 2022-12-02 NOTE — Telephone Encounter (Signed)
-----   Message from Ulysees Barns IV sent at 12/02/2022  8:52 AM EDT ----- Please let Lauren Salazar know that her CT scan shows stable disease. No new or worsening areas of disease. Her therapy appears to be working well. We will continue with her current regimen. We will see her back later this month. ----- Message ----- From: Interface, Rad Results In Sent: 11/19/2022   3:51 PM EDT To: Jaci Standard, MD

## 2022-12-02 NOTE — Telephone Encounter (Signed)
TCT patient regarding recent CT scan results. Spoke with her. Advised that her CT scan shows stable disease. No new or worsening areas of disease. Her therapy appears to be working well. We will continue with her current regimen. We will see her back later this month.  Pt voiced that she thought she was done with her treatment  after 6 cycles. Advised that treatment will likely continue as they are being effective. Advised that Dr. Leonides Schanz will again discuss this with her at her next visit. Pt voiced understanding.

## 2022-12-05 ENCOUNTER — Telehealth: Payer: Self-pay | Admitting: Hematology and Oncology

## 2022-12-06 ENCOUNTER — Telehealth: Payer: Self-pay | Admitting: *Deleted

## 2022-12-06 NOTE — Telephone Encounter (Signed)
Order for rollator placed in [place] to [employer]

## 2022-12-06 NOTE — Telephone Encounter (Signed)
Faxed completed form for personal care services to [facility]: [facility]

## 2022-12-06 NOTE — Telephone Encounter (Signed)
Tresa Endo, Case Manager with [facility] is asking for Dr Leonides Schanz to send in a prescription for a rollator to help with ambulation. And will we complete a form to assist her in arranging PCS Services/home health aide as patient is having difficulty with ambulation and ADLs.  Will fax the form to [place].

## 2022-12-09 ENCOUNTER — Encounter: Payer: Self-pay | Admitting: Physician Assistant

## 2022-12-09 ENCOUNTER — Encounter: Payer: Self-pay | Admitting: Hematology and Oncology

## 2022-12-19 MED FILL — Dexamethasone Sodium Phosphate Inj 100 MG/10ML: INTRAMUSCULAR | Qty: 1 | Status: AC

## 2022-12-19 MED FILL — Fosaprepitant Dimeglumine For IV Infusion 150 MG (Base Eq): INTRAVENOUS | Qty: 5 | Status: AC

## 2022-12-20 ENCOUNTER — Encounter: Payer: Self-pay | Admitting: Nurse Practitioner

## 2022-12-20 ENCOUNTER — Inpatient Hospital Stay: Payer: Medicaid Other

## 2022-12-20 ENCOUNTER — Inpatient Hospital Stay: Payer: Medicaid Other | Admitting: Physician Assistant

## 2022-12-20 ENCOUNTER — Inpatient Hospital Stay (HOSPITAL_BASED_OUTPATIENT_CLINIC_OR_DEPARTMENT_OTHER): Payer: Medicaid Other | Admitting: Nurse Practitioner

## 2022-12-20 VITALS — HR 105

## 2022-12-20 DIAGNOSIS — Z5111 Encounter for antineoplastic chemotherapy: Secondary | ICD-10-CM | POA: Diagnosis not present

## 2022-12-20 DIAGNOSIS — C3490 Malignant neoplasm of unspecified part of unspecified bronchus or lung: Secondary | ICD-10-CM | POA: Diagnosis not present

## 2022-12-20 DIAGNOSIS — C3431 Malignant neoplasm of lower lobe, right bronchus or lung: Secondary | ICD-10-CM

## 2022-12-20 DIAGNOSIS — Z515 Encounter for palliative care: Secondary | ICD-10-CM

## 2022-12-20 DIAGNOSIS — G893 Neoplasm related pain (acute) (chronic): Secondary | ICD-10-CM | POA: Diagnosis not present

## 2022-12-20 DIAGNOSIS — Z95828 Presence of other vascular implants and grafts: Secondary | ICD-10-CM

## 2022-12-20 LAB — CBC WITH DIFFERENTIAL (CANCER CENTER ONLY)
Abs Immature Granulocytes: 0.03 10*3/uL (ref 0.00–0.07)
Basophils Absolute: 0.1 10*3/uL (ref 0.0–0.1)
Basophils Relative: 1 %
Eosinophils Absolute: 0 10*3/uL (ref 0.0–0.5)
Eosinophils Relative: 0 %
HCT: 41.3 % (ref 36.0–46.0)
Hemoglobin: 13.2 g/dL (ref 12.0–15.0)
Immature Granulocytes: 0 %
Lymphocytes Relative: 11 %
Lymphs Abs: 1 10*3/uL (ref 0.7–4.0)
MCH: 29.6 pg (ref 26.0–34.0)
MCHC: 32 g/dL (ref 30.0–36.0)
MCV: 92.6 fL (ref 80.0–100.0)
Monocytes Absolute: 0.9 10*3/uL (ref 0.1–1.0)
Monocytes Relative: 10 %
Neutro Abs: 6.6 10*3/uL (ref 1.7–7.7)
Neutrophils Relative %: 78 %
Platelet Count: 218 10*3/uL (ref 150–400)
RBC: 4.46 MIL/uL (ref 3.87–5.11)
RDW: 17.2 % — ABNORMAL HIGH (ref 11.5–15.5)
WBC Count: 8.5 10*3/uL (ref 4.0–10.5)
nRBC: 0 % (ref 0.0–0.2)

## 2022-12-20 LAB — TSH: TSH: 6.854 u[IU]/mL — ABNORMAL HIGH (ref 0.350–4.500)

## 2022-12-20 LAB — CMP (CANCER CENTER ONLY)
ALT: 7 U/L (ref 0–44)
AST: 12 U/L — ABNORMAL LOW (ref 15–41)
Albumin: 3.5 g/dL (ref 3.5–5.0)
Alkaline Phosphatase: 82 U/L (ref 38–126)
Anion gap: 7 (ref 5–15)
BUN: 22 mg/dL (ref 8–23)
CO2: 27 mmol/L (ref 22–32)
Calcium: 9 mg/dL (ref 8.9–10.3)
Chloride: 106 mmol/L (ref 98–111)
Creatinine: 0.96 mg/dL (ref 0.44–1.00)
GFR, Estimated: >60 mL/min (ref >60)
Glucose, Bld: 113 mg/dL — ABNORMAL HIGH (ref 70–99)
Potassium: 4.1 mmol/L (ref 3.5–5.1)
Sodium: 140 mmol/L (ref 135–145)
Total Bilirubin: 0.3 mg/dL (ref 0.3–1.2)
Total Protein: 6.8 g/dL (ref 6.5–8.1)

## 2022-12-20 LAB — TOTAL PROTEIN, URINE DIPSTICK: Protein, ur: 30 mg/dL — AB

## 2022-12-20 MED ORDER — LIDOCAINE 5 % EX PTCH
1.0000 | MEDICATED_PATCH | CUTANEOUS | 0 refills | Status: DC
Start: 2022-12-20 — End: 2023-08-07

## 2022-12-20 MED ORDER — KETOROLAC TROMETHAMINE 30 MG/ML IJ SOLN
30.0000 mg | Freq: Once | INTRAMUSCULAR | Status: AC
  Administered 2022-12-20: 30 mg via INTRAVENOUS
  Filled 2022-12-20: qty 1

## 2022-12-20 MED ORDER — SODIUM CHLORIDE 0.9 % IV SOLN
10.0000 mg | Freq: Once | INTRAVENOUS | Status: AC
  Administered 2022-12-20: 10 mg via INTRAVENOUS
  Filled 2022-12-20: qty 10

## 2022-12-20 MED ORDER — SODIUM CHLORIDE 0.9 % IV SOLN
10.0000 mg/kg | Freq: Once | INTRAVENOUS | Status: AC
  Administered 2022-12-20: 500 mg via INTRAVENOUS
  Filled 2022-12-20: qty 50

## 2022-12-20 MED ORDER — NICOTINE 21 MG/24HR TD PT24
21.0000 mg | MEDICATED_PATCH | Freq: Every day | TRANSDERMAL | 1 refills | Status: DC
Start: 2022-12-20 — End: 2023-08-07

## 2022-12-20 MED ORDER — SODIUM CHLORIDE 0.9% FLUSH
10.0000 mL | INTRAVENOUS | Status: DC | PRN
  Administered 2022-12-20: 10 mL

## 2022-12-20 MED ORDER — SODIUM CHLORIDE 0.9 % IV SOLN
150.0000 mg | Freq: Once | INTRAVENOUS | Status: AC
  Administered 2022-12-20: 150 mg via INTRAVENOUS
  Filled 2022-12-20: qty 150

## 2022-12-20 MED ORDER — HEPARIN SOD (PORK) LOCK FLUSH 100 UNIT/ML IV SOLN
500.0000 [IU] | Freq: Once | INTRAVENOUS | Status: AC | PRN
  Administered 2022-12-20: 500 [IU]

## 2022-12-20 MED ORDER — SODIUM CHLORIDE 0.9 % IV SOLN: Freq: Once | INTRAVENOUS | Status: AC

## 2022-12-20 MED ORDER — ACETAMINOPHEN 325 MG PO TABS
650.0000 mg | ORAL_TABLET | Freq: Once | ORAL | Status: AC
  Administered 2022-12-20: 650 mg via ORAL
  Filled 2022-12-20: qty 2

## 2022-12-20 MED ORDER — SODIUM CHLORIDE 0.9 % IV SOLN
50.0000 mg/m2 | Freq: Once | INTRAVENOUS | Status: AC
  Administered 2022-12-20: 80 mg via INTRAVENOUS
  Filled 2022-12-20: qty 8

## 2022-12-20 MED ORDER — DIPHENHYDRAMINE HCL 50 MG/ML IJ SOLN
50.0000 mg | Freq: Once | INTRAMUSCULAR | Status: AC
  Administered 2022-12-20: 50 mg via INTRAVENOUS
  Filled 2022-12-20: qty 1

## 2022-12-20 MED ORDER — TRAMADOL HCL 50 MG PO TABS: 50.0000 mg | ORAL_TABLET | Freq: Once | ORAL | Status: DC

## 2022-12-20 MED ORDER — LIDOCAINE-PRILOCAINE 2.5-2.5 % EX CREA
1.0000 | TOPICAL_CREAM | CUTANEOUS | 0 refills | Status: DC | PRN
Start: 2022-12-20 — End: 2023-06-25

## 2022-12-20 NOTE — Progress Notes (Unsigned)
Palliative Medicine [facility]  Telephone:([phone]) [phone] Fax:(336) [phone]   Name: Lauren Salazar Date: 12/20/2022 MRN: [id number]  DOB: [birth year]  Patient Care Team: [facility] [place] as PCP - General (Family Medicine)   INTERVAL HISTORY: Lauren Salazar is a 62 y.o. 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 Patient is also status post robotic assisted laparoscopic cystectomy and pelvic exoneration including hysterectomy, bilateral oophorectomy and ileoconduit diversion. Palliative ask to see for symptom and pain management and goals of care.   SOCIAL HISTORY:    Lauren Salazar reports that she has been smoking cigarettes. She has never used smokeless tobacco. She reports that she does not drink alcohol and does not use drugs.  ADVANCE DIRECTIVES:  None on file  CODE STATUS: Full code  PAST MEDICAL HISTORY: Past Medical History:  Diagnosis Date   Bladder tumor    Cancer (HCC)    Complication of anesthesia    slow to wake after spinal fusion, then was "looney" for the following 24 hours    Persistent dry cough    ongoing since feb 2020    ALLERGIES:  is allergic to celecoxib.  MEDICATIONS:  Current Outpatient Medications  Medication Sig Dispense Refill   acetaminophen (TYLENOL) 500 MG tablet 1 tablet as needed Orally every 6 hrs     benzonatate (TESSALON) 100 MG capsule Take 2 capsules (200 mg total) by mouth 3 (three) times daily as needed for cough. 20 capsule 2   diphenoxylate-atropine ([name]) 2.5-0.025 MG tablet Take 1 tablet by mouth 4 (four) times daily as needed for diarrhea or loose stools. 30 tablet 0   folic acid (FOLVITE) 1 MG tablet TAKE 1 TABLET(1 MG) BY MOUTH DAILY 90 tablet 3   HYDROcodone-acetaminophen (NORCO) 5-325 MG tablet Take 1 tablet by mouth every 4 (four) hours as needed for moderate  pain. 120 tablet 0   lidocaine (LIDODERM) 5 % Place 1 patch onto the skin daily. Remove & Discard patch within 12 hours or as directed by MD 30 patch 0   lidocaine-prilocaine (EMLA) cream Apply 1 Application topically as needed. (Patient not taking: Reported on 11/29/2022) 30 g 0   magic mouthwash (nystatin, diphenhydrAMINE, alum & mag hydroxide) suspension mixture Swish and swallow 10 mLs 3 (three) times daily. 240 mL 2   meloxicam (MOBIC) 15 MG tablet Take 1 tablet (15 mg total) by mouth daily. 30 tablet 0   nicotine (NICODERM CQ - DOSED IN MG/24 HOURS) 21 mg/24hr patch Place 21 mg onto the skin daily. (Patient not taking: Reported on 12/22/2020)     ondansetron (ZOFRAN) 8 MG tablet Take 1 tablet (8 mg total) by mouth every 8 (eight) hours as needed for nausea or vomiting. 20 tablet 1   prochlorperazine (COMPAZINE) 10 MG tablet Take 1 tablet (10 mg total) by mouth every 6 (six) hours as needed for nausea or vomiting. 30 tablet 2   No current facility-administered medications for this visit.    VITAL SIGNS: There were no vitals taken for this visit. There were no vitals filed for this visit.  Estimated body mass index is 18.06 kg/m as calculated from the following:   Height as of 07/25/22: 5\' 6"  (1.676 m).   Weight as of 11/29/22: 111 lb 14.4 oz (50.8 kg).   PERFORMANCE STATUS (ECOG) : 1 - Symptomatic but completely ambulatory  Physical Exam: General: NAD,  resting comfortably in recliner Cardiovascular: regular rate and rhythm Pulmonary: regular, even, and unlabored Extremities: no edema, no joint deformities Neurological: alert, oriented x 4, mood appropriate   IMPRESSION: I saw Lauren Salazar during her infusion. No acute distress. Denies nausea, vomiting, constipation, or diarrhea. Is taking things one day at a time. Appetite is fair.   Neoplasm related pain Lauren Salazar states she continues to have some pain however not as severe as it was previously. She has Norco available. Does not  take around the clock. Occasionally at bedtime when needed. Does not take during the day as she does not like to feel drowsy. She is taking mobic daily and Tylenol as needed. Uses lidocaine patches. Feels this manages her pain overall.   No adjustments at this time as requested by patient. We will continue to support and follow as needed. Patient knows to contact our office with any needs.   PLAN:  Norco one tablet every 4-6 hours as needed. Does not take daily Mobic daily. Miralax daily  Lidocaine patch  We will continue to closely monitor and assist with symptom management as needed. Palliative will plan to see patient back in 3-4 weeks in collaboration to other oncology appointments.    Patient expressed understanding and was in agreement with this plan. She also understands that She can call the clinic at any time with any questions, concerns, or complaints.   Any controlled substances utilized were prescribed in the context of palliative care. PDMP has been reviewed.    Visit consisted of counseling and education dealing with the complex and emotionally intense issues of symptom management and palliative care in the setting of serious and potentially life-threatening illness.Greater than 50%  of this time was spent counseling and coordinating care related to the above assessment and plan.   [name], AGPCNP-BC  Palliative Medicine Team/[facility]  *Please note that this is a verbal dictation therefore any spelling or grammatical errors are due to the "Dragon [employer] One" system interpretation.

## 2022-12-20 NOTE — Progress Notes (Signed)
Per [name], Lauren Salazar, ok to treat with heart rate of 105.

## 2022-12-20 NOTE — Patient Instructions (Signed)
 [place] [facility]  Discharge Instructions: Thank you for choosing [facility] to provide your oncology and hematology care.   If you have a lab appointment with the Cancer Center, please go directly to the Cancer Center and check in at the registration area.   Wear comfortable clothing and clothing appropriate for easy access to any Portacath or PICC line.   We strive to give you quality time with your provider. You may need to reschedule your appointment if you arrive late (15 or more minutes).  Arriving late affects you and other patients whose appointments are after yours.  Also, if you miss three or more appointments without notifying the office, you may be dismissed from the clinic at the provider's discretion.      For prescription refill requests, have your pharmacy contact our office and allow 72 hours for refills to be completed.    Today you received the following chemotherapy and/or immunotherapy agents docetaxel, cyramza      To help prevent nausea and vomiting after your treatment, we encourage you to take your nausea medication as directed.  BELOW ARE SYMPTOMS THAT SHOULD BE REPORTED IMMEDIATELY: *FEVER GREATER THAN 100.4 F (38 C) OR HIGHER *CHILLS OR SWEATING *NAUSEA AND VOMITING THAT IS NOT CONTROLLED WITH YOUR NAUSEA MEDICATION *UNUSUAL SHORTNESS OF BREATH *UNUSUAL BRUISING OR BLEEDING *URINARY PROBLEMS (pain or burning when urinating, or frequent urination) *BOWEL PROBLEMS (unusual diarrhea, constipation, pain near the anus) TENDERNESS IN MOUTH AND THROAT WITH OR WITHOUT PRESENCE OF ULCERS (sore throat, sores in mouth, or a toothache) UNUSUAL RASH, SWELLING OR PAIN  UNUSUAL VAGINAL DISCHARGE OR ITCHING   Items with * indicate a potential emergency and should be followed up as soon as possible or go to the Emergency Department if any problems should occur.  Please show the CHEMOTHERAPY ALERT CARD or IMMUNOTHERAPY ALERT CARD  at check-in to the Emergency Department and triage nurse.  Should you have questions after your visit or need to cancel or reschedule your appointment, please contact [place] [facility]  Dept: [phone]  and follow the prompts.  Office hours are 8:00 a.m. to 4:30 p.m. Monday - Friday. Please note that voicemails left after 4:00 p.m. may not be returned until the following business day.  We are closed weekends and major holidays. You have access to a nurse at all times for urgent questions. Please call the main number to the clinic Dept: [phone] and follow the prompts.   For any non-urgent questions, you may also contact your provider using MyChart. We now offer e-Visits for anyone 29 and older to request care online for non-urgent symptoms. For details visit [email or web address].   Also download the MyChart app! Go to the app store, search "MyChart", open the app, select , and log in with your MyChart username and password.

## 2022-12-20 NOTE — Progress Notes (Signed)
[facility] [phone]   [phone]) [phone]  PROGRESS NOTE  Patient Care Team: [facility] [place] as PCP - General (Family Medicine)  Hematological/Oncological History # Metastatic Adenosquamous Lung Cancer  05/10/2022: last visit with Dr. Clelia Croft 05/26/2022: CT scan showed enlargement of RLL mass, right iliac crest mass with large soft tissue components extending in the iliacus and gluteus musculature. Additionally new pathologic right external iliac adenopathy.  06/07/2022: transition care to Dr. Leonides Schanz  07/12/2022: Cycle 1 Day 1 of docetaxel and ramucirumab 08/02/2022: Cycle 2 Day 1 of docetaxel and ramucirumab (dose reduced docetaxel from 75 mg/m2 to 60 mg/m2 due to poor tolerance) 08/23/2022: Cycle 3 Day 1 of docetaxel and ramucirumab 10/04/2022: Cycle 4 Day 1 of docetaxel and ramucirumab 11/01/2022: Cycle 5 Day 1 of docetaxel and ramucirumab 11/29/2022: Cycle 6 Day 1 of docetaxel and ramucirumab (dose reduced docetaxel from 60 mg/m2 to 50 mg/m2 due to persistent fatigue) 12/20/2022: Cycle 7 Day 1 of docetaxel and ramucirumab  Interval History:  Lauren Salazar 62 y.o. female with medical history significant for metastatic adenosquamous lung cancer presents for a follow up visit. The patient's last visit was on 11/29/2022. She presents today for a follow up before Cycle 7, Day 1  Ms. Hakimian reports having persistent fatigue that is unchanged since last visit. She reports the fatigue lasts for 2 weeks after each treatment. She reports her appetite is fair. She suspects possible URI due to aving scratchy throat and dry cough. She denies any worsening shortness of breath or fevers. She reports intermittent episodes of nausea with 2-3 episodes of vomiting. She reports her symptoms improve with her prescribed antiemetics. She denies any bowel habit changes. She denies easy bruising or signs of active bleeding.  She has no other complaints. Rest of  the 10 point ROS was otherwise negative.  She notes that she is willing and able to proceed with chemotherapy treatment today.  MEDICAL HISTORY:  Past Medical History:  Diagnosis Date   Bladder tumor    Cancer (HCC)    Complication of anesthesia    slow to wake after spinal fusion, then was "looney" for the following 24 hours    Persistent dry cough    ongoing since feb 2020    SURGICAL HISTORY: Past Surgical History:  Procedure Laterality Date   CERVICAL FUSION  2005   c5-c6      CYSTOSCOPY WITH INJECTION N/A 02/01/2019   Procedure: CYSTOSCOPY WITH INJECTION;  Surgeon: Sebastian Ache, MD;  Location: [facility];  Service: Urology;  Laterality: N/A;   IR IMAGING GUIDED PORT INSERTION  07/25/2022   LYMPHADENECTOMY Bilateral 02/01/2019   Procedure: LYMPHADENECTOMY;  Surgeon: Sebastian Ache, MD;  Location: [facility];  Service: Urology;  Laterality: Bilateral;   ROBOT ASSISTED LAPAROSCOPIC COMPLETE CYSTECT ILEAL CONDUIT N/A 02/01/2019   Procedure: XI ROBOTIC ASSISTED LAPAROSCOPIC COMPLETE CYSTECTOMY, ILEAL CONDUIT, HYSTERECTOMY WITH BILATERAL SALPINGOOPHORECTOMY;  Surgeon: Sebastian Ache, MD;  Location: [facility];  Service: Urology;  Laterality: N/A;  6 HRS   TRANSURETHRAL RESECTION OF BLADDER TUMOR N/A 10/31/2018   Procedure: TRANSURETHRAL RESECTION OF BLADDER TUMOR (TURBT) WITH CYSTOSCOPY/ POSSIBLE INTRAVESICAL GEMCITABINE;  Surgeon: Rene Paci, MD;  Location: [facility];  Service: Urology;  Laterality: N/A;    SOCIAL HISTORY: Social History   Socioeconomic History   Marital status: Divorced    Spouse name: Not on file   Number of children: Not on file   Years of education: Not on file   Highest education  level: Not on file  Occupational History   Not on file  Tobacco Use   Smoking status: Every Day    Current packs/day: 1.00    Types: Cigarettes   Smokeless tobacco: Never   Tobacco comments:    smoking since age 74 , she is cutting back, using patches, trying to stop.  04/02/19  Vaping Use   Vaping status: Never Used  Substance and Sexual Activity   Alcohol use: No   Drug use: No   Sexual activity: Not Currently  Other Topics Concern   Not on file  Social History Narrative   Not on file   Social Determinants of Health   Financial Resource Strain: Not on file  Food Insecurity: Not on file  Transportation Needs: No Transportation Needs (04/02/2019)   PRAPARE - Administrator, Civil Service (Medical): No    Lack of Transportation (Non-Medical): No  Physical Activity: Not on file  Stress: Not on file  Social Connections: Not on file  Intimate Partner Violence: Not At Risk (04/02/2019)   Humiliation, Afraid, Rape, and Kick questionnaire    Fear of Current or Ex-Partner: No    Emotionally Abused: No    Physically Abused: No    Sexually Abused: No    FAMILY HISTORY: No family history on file.  ALLERGIES:  is allergic to celecoxib.  MEDICATIONS:  Current Outpatient Medications  Medication Sig Dispense Refill   acetaminophen (TYLENOL) 500 MG tablet 1 tablet as needed Orally every 6 hrs     benzonatate (TESSALON) 100 MG capsule Take 2 capsules (200 mg total) by mouth 3 (three) times daily as needed for cough. 20 capsule 2   diphenoxylate-atropine ([name]) 2.5-0.025 MG tablet Take 1 tablet by mouth 4 (four) times daily as needed for diarrhea or loose stools. 30 tablet 0   folic acid (FOLVITE) 1 MG tablet TAKE 1 TABLET(1 MG) BY MOUTH DAILY 90 tablet 3   HYDROcodone-acetaminophen (NORCO) 5-325 MG tablet Take 1 tablet by mouth every 4 (four) hours as needed for moderate pain. 120 tablet 0   magic mouthwash (nystatin, diphenhydrAMINE, alum & mag hydroxide) suspension mixture Swish and swallow 10 mLs 3 (three) times daily. 240 mL 2   meloxicam (MOBIC) 15 MG tablet Take 1 tablet (15 mg total) by mouth daily. 30 tablet 0   prochlorperazine (COMPAZINE) 10 MG tablet Take 1 tablet (10 mg total) by mouth every 6 (six) hours as needed for nausea or  vomiting. 30 tablet 2   lidocaine (LIDODERM) 5 % Place 1 patch onto the skin daily. Remove & Discard patch within 12 hours or as directed by MD 30 patch 0   lidocaine-prilocaine (EMLA) cream Apply 1 Application topically as needed. 30 g 0   nicotine (NICODERM CQ - DOSED IN MG/24 HOURS) 21 mg/24hr patch Place 1 patch (21 mg total) onto the skin daily. 28 patch 1   ondansetron (ZOFRAN) 8 MG tablet Take 1 tablet (8 mg total) by mouth every 8 (eight) hours as needed for nausea or vomiting. (Patient not taking: Reported on 12/20/2022) 20 tablet 1   No current facility-administered medications for this visit.   Facility-Administered Medications Ordered in Other Visits  Medication Dose Route Frequency Provider Last Rate Last Admin   sodium chloride flush (NS) 0.9 % injection 10 mL  10 mL Intracatheter PRN Ulysees Barns IV, MD   10 mL at 12/20/22 1631   traMADol (ULTRAM) tablet 50 mg  50 mg Oral Once Briant Cedar, PA-C  REVIEW OF SYSTEMS:   Constitutional: ( - ) fevers, ( - )  chills , ( - ) night sweats Eyes: ( - ) blurriness of vision, ( - ) double vision, ( - ) watery eyes Ears, nose, mouth, throat, and face: ( - ) mucositis, ( - ) sore throat Respiratory: (+ ) cough, ( - ) dyspnea, ( - ) wheezes Cardiovascular: ( - ) palpitation, ( - ) chest discomfort, ( - ) lower extremity swelling Gastrointestinal:  ( + ) nausea, ( - ) heartburn, ( -) change in bowel habits Skin: ( - ) abnormal skin rashes Lymphatics: ( - ) new lymphadenopathy, ( - ) easy bruising Neurological: ( - ) numbness, ( - ) tingling, ( - ) new weaknesses Behavioral/Psych: ( - ) mood change, ( - ) new changes  All other systems were reviewed with the patient and are negative.  PHYSICAL EXAMINATION: ECOG PERFORMANCE STATUS: 1 - Symptomatic but completely ambulatory  Vitals:   12/20/22 1247  BP: 136/87  Pulse: (!) 117  Resp: 17  Temp: 97.9 F (36.6 C)  SpO2: 99%   Repeat HR is 105 bpm.   Filed Weights    12/20/22 1247  Weight: 110 lb 14.4 oz (50.3 kg)    GENERAL: Chronically ill-appearing middle-aged Caucasian female, alert, no distress and comfortable SKIN: skin color, texture, turgor are normal, no rashes or significant lesions EYES: conjunctiva are pink and non-injected, sclera clear LUNGS: clear to auscultation and percussion with normal breathing effort HEART: regular rate & rhythm and no murmurs and no lower extremity edema Musculoskeletal: no cyanosis of digits and no clubbing  PSYCH: alert & oriented x 3, fluent speech NEURO: no focal motor/sensory deficits  LABORATORY DATA:  I have reviewed the data as listed    Latest Ref Rng & Units 12/20/2022   12:16 PM 11/29/2022    9:05 AM 11/01/2022   11:13 AM  CBC  WBC 4.0 - 10.5 K/uL 8.5  8.6  6.8   Hemoglobin 12.0 - 15.0 g/dL 81.1  91.4  78.2   Hematocrit 36.0 - 46.0 % 41.3  39.8  42.7   Platelets 150 - 400 K/uL 218  252  237        Latest Ref Rng & Units 12/20/2022   12:16 PM 11/29/2022    9:05 AM 11/01/2022   11:13 AM  CMP  Glucose 70 - 99 mg/dL 956  213  81   BUN 8 - 23 mg/dL 22  27  22    Creatinine 0.44 - 1.00 mg/dL 0.86  5.78  4.69   Sodium 135 - 145 mmol/L 140  140  140   Potassium 3.5 - 5.1 mmol/L 4.1  4.3  4.5   Chloride 98 - 111 mmol/L 106  104  108   CO2 22 - 32 mmol/L 27  28  26    Calcium 8.9 - 10.3 mg/dL 9.0  8.4  9.3   Total Protein 6.5 - 8.1 g/dL 6.8  6.6  7.1   Total Bilirubin 0.3 - 1.2 mg/dL 0.3  0.3  0.2   Alkaline Phos 38 - 126 U/L 82  78  83   AST 15 - 41 U/L 12  12  14    ALT 0 - 44 U/L 7  6  7      RADIOGRAPHIC STUDIES: No results found.  ASSESSMENT & PLAN Lauren Salazar is a 61 y.o. female with medical history significant for metastatic adenosquamous lung cancer presents for a follow up visit.  She is status post robotic assisted laparoscopic cystectomy and pelvic exoneration including hysterectomy, bilateral salpingo-oophorectomy and ileoconduit diversion completed on February 01, 2019.  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   Radiation therapy with weekly chemotherapy utilizing carboplatin and paclitaxel.  Week 1 of chemotherapy started on May 14, 2019.  Therapy concluded in February 2021.   Durvalumab 10 mg/kg cycle 1 on Sep 03, 2019 every 14 days.  She completed 4 cycles of therapy.   Carboplatin with Alimta and Pembrolizumab started on August 04, 2020.  She completed 3 cycles of therapy in June 2022.   Pembrolizumab 400 mg every 6 weeks started on December 22, 2020.  Therapy discontinued and March 2023 due to patient's preference.  Progression noted on CT scan from 05/26/2022.   Completed palliative radiation to her R hip/pelvis on 07/01/2022. Received 30 Gy in 10 Fx.   Started Docetaxel and Ramucirumab on 07/12/2022.  # Metastatic Adenosquamous Carcinoma of the Lung  --patient has undergone numerous lines of treatment including chemoradiation followed by immunotherapy, subsequent carbo/pem/pem and then monotherapy pembrolizumab --progression of disease noted on 05/26/2022 CT scan. --Started docetaxel plus ramucirumab on 07/12/2022 --Due to poor tolerance with Cycle 1, dose reduced Doxcetaxel from 75 mg/m2 to 60 mg/m2 starting Cycle 2.  --Due to persistent fatigue, dose reduced docetaxel from 60 mg/m2 to 50 mg/m2 starting Cycle 6 on 11/29/2022.  --Most CT scan from 11/18/2022 continued treatment response with stable RLL lung mass, decrease in size of right adrenal nodule, left retroperitoneal lymph node and sclerosis of bone metastases. No evidence of new or progressive disease.  Plan: -- today is Cycle 7 Day 1 of Docetaxel/Ramucircumab -- labs today show white blood cell 8.5, Hgb 13.2, MCV 92.6, Plt 218. Creatinine and LFTs normal.  --Proceed with treatment today without any dose modifications. Consider treatment holiday if she has continued difficulty with tolerance.  --RTC in 3 weeks with labs, follow up before Cycle 8, Day 1  Docetaxel/Ramucircumab.  # Abdominal Pain/ Cancer Related Pain --Prescribed oxycodone 5 mg as needed, however patient avoids taking this as it causes her to be "loopy". --Patient completed palliative radiation with radiation oncology to right hip/pelvis with improvement of pain --Patient is c/o right sided mid back pain/hip pain. Currently take Norco 5-325 mg nightly to help her sleep but pain relief is short term.  --continue long acting pain medication with Xtampza 13.5 mg q 12 hours.   #Nausea: --Added IV emend to her pre-meds --Continue to take zofran and compazine as needed.   #Diarrhea: --Secondary to chemotherapy --Continue to take imodium as needed.   #Joint pain 2/2 GCSF injection: --Recommend to take Claritin for several days with injection to minimize side effects.    No orders of the defined types were placed in this encounter.   All questions were answered. The patient knows to call the clinic with any problems, questions or concerns.  A total of more than 30 minutes were spent on this encounter with face-to-face time and non-face-to-face time, including preparing to see the patient, ordering tests and/or medications, counseling the patient and coordination of care as outlined above.   Georga Kaufmann PA-C Dept of Hematology and Oncology [facility] Phone: [phone]     12/20/2022 4:41 PM

## 2022-12-21 ENCOUNTER — Encounter: Payer: Self-pay | Admitting: Physician Assistant

## 2022-12-21 ENCOUNTER — Encounter: Payer: Self-pay | Admitting: Hematology and Oncology

## 2022-12-22 ENCOUNTER — Inpatient Hospital Stay: Payer: Medicaid Other

## 2022-12-22 ENCOUNTER — Other Ambulatory Visit: Payer: Self-pay | Admitting: Hematology and Oncology

## 2022-12-22 VITALS — BP 163/89 | HR 97 | Temp 98.7°F | Resp 18

## 2022-12-22 DIAGNOSIS — C3490 Malignant neoplasm of unspecified part of unspecified bronchus or lung: Secondary | ICD-10-CM

## 2022-12-22 DIAGNOSIS — Z5111 Encounter for antineoplastic chemotherapy: Secondary | ICD-10-CM | POA: Diagnosis not present

## 2022-12-22 LAB — T4: T4, Total: 6.7 ug/dL (ref 4.5–12.0)

## 2022-12-22 MED ORDER — KETOROLAC TROMETHAMINE 30 MG/ML IJ SOLN
30.0000 mg | Freq: Once | INTRAMUSCULAR | Status: AC
  Administered 2022-12-22: 30 mg via INTRAMUSCULAR

## 2022-12-22 MED ORDER — KETOROLAC TROMETHAMINE 30 MG/ML IJ SOLN
30.0000 mg | Freq: Once | INTRAMUSCULAR | Status: DC
  Filled 2022-12-22: qty 1

## 2022-12-22 MED ORDER — PEGFILGRASTIM-CBQV 6 MG/0.6ML ~~LOC~~ SOSY
6.0000 mg | PREFILLED_SYRINGE | Freq: Once | SUBCUTANEOUS | Status: AC
  Administered 2022-12-22: 6 mg via SUBCUTANEOUS
  Filled 2022-12-22: qty 0.6

## 2022-12-22 NOTE — Progress Notes (Signed)
Pt. Here for Udenyca injection.  Request a Toradol injection as well.  Secure Chatted Dr. Leonides Schanz.  Ok order for IV.  Melissa/pharmacist made aware pt. Will get injection administered IM instead of IV.  Order changed per pharmacist

## 2022-12-27 ENCOUNTER — Other Ambulatory Visit: Payer: Self-pay

## 2022-12-27 DIAGNOSIS — G893 Neoplasm related pain (acute) (chronic): Secondary | ICD-10-CM

## 2022-12-27 DIAGNOSIS — Z515 Encounter for palliative care: Secondary | ICD-10-CM

## 2022-12-27 DIAGNOSIS — C3431 Malignant neoplasm of lower lobe, right bronchus or lung: Secondary | ICD-10-CM

## 2022-12-27 MED ORDER — MELOXICAM 15 MG PO TABS: 15.0000 mg | ORAL_TABLET | Freq: Every day | ORAL | 0 refills | Status: DC

## 2023-01-02 ENCOUNTER — Telehealth: Payer: Self-pay | Admitting: *Deleted

## 2023-01-02 ENCOUNTER — Telehealth: Payer: Self-pay | Admitting: Hematology and Oncology

## 2023-01-02 NOTE — Telephone Encounter (Signed)
Received call from pt stating that she has a bad URI and has not been out of bed since her last treatment. She is still not feeling well and is asking to cancel her next appts on 01/10/23. She said she would be ok to get re-scheduled the following week.  Encouraged pt to get a Covid test done as we have been seeing more pt's with Covid. She said she would have someone go out to get her a Covid test kit. Advised to call back with the results. She said she would.  Scheduling message sent

## 2023-01-09 ENCOUNTER — Ambulatory Visit: Payer: Medicaid Other | Admitting: Hematology and Oncology

## 2023-01-09 ENCOUNTER — Ambulatory Visit: Payer: Medicaid Other

## 2023-01-09 ENCOUNTER — Other Ambulatory Visit: Payer: Medicaid Other

## 2023-01-10 ENCOUNTER — Inpatient Hospital Stay: Payer: Medicaid Other | Admitting: Hematology and Oncology

## 2023-01-10 ENCOUNTER — Inpatient Hospital Stay: Payer: Medicaid Other

## 2023-01-12 ENCOUNTER — Inpatient Hospital Stay: Payer: Medicaid Other

## 2023-01-16 MED FILL — Dexamethasone Sodium Phosphate Inj 100 MG/10ML: INTRAMUSCULAR | Qty: 1 | Status: AC

## 2023-01-16 MED FILL — Fosaprepitant Dimeglumine For IV Infusion 150 MG (Base Eq): INTRAVENOUS | Qty: 5 | Status: AC

## 2023-01-16 NOTE — Progress Notes (Deleted)
Palliative Medicine [facility]  Telephone:(336) [phone] Fax:(336) [phone]   Name: Lauren Salazar Date: 01/16/2023 MRN: [id number]  DOB: [birth year]  Patient Care Team: [facility] [place] as PCP - General (Family Medicine)   INTERVAL HISTORY: Lauren Salazar is a 62 y.o. 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 Patient is also status post robotic assisted laparoscopic cystectomy and pelvic exoneration including hysterectomy, bilateral oophorectomy and ileoconduit diversion. Palliative ask to see for symptom and pain management and goals of care.   SOCIAL HISTORY:    Lauren Salazar reports that she has been smoking cigarettes. She has never used smokeless tobacco. She reports that she does not drink alcohol and does not use drugs.  ADVANCE DIRECTIVES:  None on file  CODE STATUS: Full code  PAST MEDICAL HISTORY: Past Medical History:  Diagnosis Date   Bladder tumor    Cancer (HCC)    Complication of anesthesia    slow to wake after spinal fusion, then was "looney" for the following 24 hours    Persistent dry cough    ongoing since feb 2020    ALLERGIES:  is allergic to celecoxib.  MEDICATIONS:  Current Outpatient Medications  Medication Sig Dispense Refill   acetaminophen (TYLENOL) 500 MG tablet 1 tablet as needed Orally every 6 hrs     benzonatate (TESSALON) 100 MG capsule Take 2 capsules (200 mg total) by mouth 3 (three) times daily as needed for cough. 20 capsule 2   diphenoxylate-atropine ([name]) 2.5-0.025 MG tablet Take 1 tablet by mouth 4 (four) times daily as needed for diarrhea or loose stools. 30 tablet 0   folic acid (FOLVITE) 1 MG tablet TAKE 1 TABLET(1 MG) BY MOUTH DAILY 90 tablet 3   HYDROcodone-acetaminophen (NORCO) 5-325 MG tablet Take 1 tablet by mouth every 4 (four) hours as needed for moderate  pain. 120 tablet 0   lidocaine (LIDODERM) 5 % Place 1 patch onto the skin daily. Remove & Discard patch within 12 hours or as directed by MD 30 patch 0   lidocaine-prilocaine (EMLA) cream Apply 1 Application topically as needed. 30 g 0   magic mouthwash (nystatin, diphenhydrAMINE, alum & mag hydroxide) suspension mixture Swish and swallow 10 mLs 3 (three) times daily. 240 mL 2   meloxicam (MOBIC) 15 MG tablet Take 1 tablet (15 mg total) by mouth daily. 30 tablet 0   nicotine (NICODERM CQ - DOSED IN MG/24 HOURS) 21 mg/24hr patch Place 1 patch (21 mg total) onto the skin daily. 28 patch 1   prochlorperazine (COMPAZINE) 10 MG tablet Take 1 tablet (10 mg total) by mouth every 6 (six) hours as needed for nausea or vomiting. 30 tablet 2   No current facility-administered medications for this visit.    VITAL SIGNS: There were no vitals taken for this visit. There were no vitals filed for this visit.  Estimated body mass index is 17.9 kg/m as calculated from the following:   Height as of 07/25/22: 5\' 6"  (1.676 m).   Weight as of 12/20/22: 110 lb 14.4 oz (50.3 kg).   PERFORMANCE STATUS (ECOG) : 1 - Symptomatic but completely ambulatory  Physical Exam: General: NAD, resting comfortably in recliner Cardiovascular: regular rate and rhythm Pulmonary: regular, even, and unlabored Extremities: no edema, no joint deformities Neurological: alert, oriented x 4, mood appropriate   IMPRESSION:   Neoplasm related pain Ms.  Salazar states she continues to have some pain however not as severe as it was previously. She has Norco available. Does not take around the clock. Occasionally at bedtime when needed. Does not take during the day as she does not like to feel drowsy. She is taking mobic daily and Tylenol as needed. Uses lidocaine patches. Feels this manages her pain overall.   No adjustments at this time as requested by patient. We will continue to support and follow as needed. Patient knows to contact  our office with any needs.   PLAN:  Norco one tablet every 4-6 hours as needed. Does not take daily Mobic daily. Miralax daily  Lidocaine patch  We will continue to closely monitor and assist with symptom management as needed. Palliative will plan to see patient back in 3-4 weeks in collaboration to other oncology appointments.    Patient expressed understanding and was in agreement with this plan. She also understands that She can call the clinic at any time with any questions, concerns, or complaints.   Any controlled substances utilized were prescribed in the context of palliative care. PDMP has been reviewed.    Visit consisted of counseling and education dealing with the complex and emotionally intense issues of symptom management and palliative care in the setting of serious and potentially life-threatening illness.Greater than 50%  of this time was spent counseling and coordinating care related to the above assessment and plan.   Lauren Salazar, AGPCNP-BC  Palliative Medicine Team/[facility]  *Please note that this is a verbal dictation therefore any spelling or grammatical errors are due to the "[employer]agon [employer] One" system interpretation.

## 2023-01-17 ENCOUNTER — Inpatient Hospital Stay: Payer: Medicaid Other

## 2023-01-17 ENCOUNTER — Inpatient Hospital Stay: Payer: Medicaid Other | Admitting: Physician Assistant

## 2023-01-19 ENCOUNTER — Inpatient Hospital Stay: Payer: Medicaid Other

## 2023-01-27 NOTE — Progress Notes (Deleted)
Palliative Medicine [facility]  Telephone:(336) [phone] Fax:(336) [phone]   Name: Lauren Salazar Date: 01/27/2023 MRN: [id number]  DOB: 07/09/1960  Patient Care Team: [facility] [place] as PCP - General (Family Medicine)   INTERVAL HISTORY: Lauren Salazar is a 62 y.o. 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 Patient is also status post robotic assisted laparoscopic cystectomy and pelvic exoneration including hysterectomy, bilateral oophorectomy and ileoconduit diversion. Palliative ask to see for symptom and pain management and goals of care.   SOCIAL HISTORY:    Lauren Salazar reports that she has been smoking cigarettes. She has never used smokeless tobacco. She reports that she does not drink alcohol and does not use drugs.  ADVANCE DIRECTIVES:  None on file  CODE STATUS: Full code  PAST MEDICAL HISTORY: Past Medical History:  Diagnosis Date   Bladder tumor    Cancer (HCC)    Complication of anesthesia    slow to wake after spinal fusion, then was "looney" for the following 24 hours    Persistent dry cough    ongoing since feb 2020    ALLERGIES:  is allergic to celecoxib.  MEDICATIONS:  Current Outpatient Medications  Medication Sig Dispense Refill   acetaminophen (TYLENOL) 500 MG tablet 1 tablet as needed Orally every 6 hrs     benzonatate (TESSALON) 100 MG capsule Take 2 capsules (200 mg total) by mouth 3 (three) times daily as needed for cough. 20 capsule 2   diphenoxylate-atropine ([name]) 2.5-0.025 MG tablet Take 1 tablet by mouth 4 (four) times daily as needed for diarrhea or loose stools. 30 tablet 0   folic acid (FOLVITE) 1 MG tablet TAKE 1 TABLET(1 MG) BY MOUTH DAILY 90 tablet 3   HYDROcodone-acetaminophen (NORCO) 5-325 MG tablet Take 1 tablet by mouth every 4 (four) hours as needed for moderate  pain. 120 tablet 0   lidocaine (LIDODERM) 5 % Place 1 patch onto the skin daily. Remove & Discard patch within 12 hours or as directed by MD 30 patch 0   lidocaine-prilocaine (EMLA) cream Apply 1 Application topically as needed. 30 g 0   magic mouthwash (nystatin, diphenhydrAMINE, alum & mag hydroxide) suspension mixture Swish and swallow 10 mLs 3 (three) times daily. 240 mL 2   meloxicam (MOBIC) 15 MG tablet Take 1 tablet (15 mg total) by mouth daily. 30 tablet 0   nicotine (NICODERM CQ - DOSED IN MG/24 HOURS) 21 mg/24hr patch Place 1 patch (21 mg total) onto the skin daily. 28 patch 1   prochlorperazine (COMPAZINE) 10 MG tablet Take 1 tablet (10 mg total) by mouth every 6 (six) hours as needed for nausea or vomiting. 30 tablet 2   No current facility-administered medications for this visit.    VITAL SIGNS: There were no vitals taken for this visit. There were no vitals filed for this visit.  Estimated body mass index is 17.9 kg/m as calculated from the following:   Height as of 07/25/22: 5\' 6"  (1.676 m).   Weight as of 12/20/22: 110 lb 14.4 oz (50.3 kg).   PERFORMANCE STATUS (ECOG) : 1 - Symptomatic but completely ambulatory  Physical Exam: General: NAD, resting comfortably in recliner Cardiovascular: regular rate and rhythm Pulmonary: regular, even, and unlabored Extremities: no edema, no joint deformities Neurological: alert, oriented x 4, mood appropriate   IMPRESSION:   Neoplasm related pain Ms.  Cante states she continues to have some pain however not as severe as it was previously. She has Norco available. Does not take around the clock. Occasionally at bedtime when needed. Does not take during the day as she does not like to feel drowsy. She is taking mobic daily and Tylenol as needed. Uses lidocaine patches. Feels this manages her pain overall.   No adjustments at this time as requested by patient. We will continue to support and follow as needed. Patient knows to contact  our office with any needs.   PLAN:  Norco one tablet every 4-6 hours as needed. Does not take daily Mobic daily. Miralax daily  Lidocaine patch  We will continue to closely monitor and assist with symptom management as needed. Palliative will plan to see patient back in 3-4 weeks in collaboration to other oncology appointments.    Patient expressed understanding and was in agreement with this plan. She also understands that She can call the clinic at any time with any questions, concerns, or complaints.   Any controlled substances utilized were prescribed in the context of palliative care. PDMP has been reviewed.    Visit consisted of counseling and education dealing with the complex and emotionally intense issues of symptom management and palliative care in the setting of serious and potentially life-threatening illness.Greater than 50%  of this time was spent counseling and coordinating care related to the above assessment and plan.   [name], AGPCNP-BC  Palliative Medicine Team/[facility]  *Please note that this is a verbal dictation therefore any spelling or grammatical errors are due to the "[employer]agon [employer] One" system interpretation.

## 2023-01-30 MED FILL — Dexamethasone Sodium Phosphate Inj 100 MG/10ML: INTRAMUSCULAR | Qty: 1 | Status: AC

## 2023-01-30 MED FILL — Fosaprepitant Dimeglumine For IV Infusion 150 MG (Base Eq): INTRAVENOUS | Qty: 5 | Status: AC

## 2023-01-31 ENCOUNTER — Telehealth: Payer: Self-pay

## 2023-01-31 ENCOUNTER — Inpatient Hospital Stay: Payer: Medicaid Other | Attending: Oncology

## 2023-01-31 ENCOUNTER — Inpatient Hospital Stay: Payer: Medicaid Other

## 2023-01-31 ENCOUNTER — Inpatient Hospital Stay: Payer: Medicaid Other | Attending: Oncology | Admitting: Hematology and Oncology

## 2023-01-31 ENCOUNTER — Inpatient Hospital Stay: Payer: Medicaid Other | Admitting: Nurse Practitioner

## 2023-01-31 DIAGNOSIS — R0981 Nasal congestion: Secondary | ICD-10-CM | POA: Insufficient documentation

## 2023-01-31 DIAGNOSIS — F17210 Nicotine dependence, cigarettes, uncomplicated: Secondary | ICD-10-CM | POA: Insufficient documentation

## 2023-01-31 DIAGNOSIS — Z5111 Encounter for antineoplastic chemotherapy: Secondary | ICD-10-CM | POA: Insufficient documentation

## 2023-01-31 DIAGNOSIS — Z79899 Other long term (current) drug therapy: Secondary | ICD-10-CM | POA: Diagnosis not present

## 2023-01-31 DIAGNOSIS — R059 Cough, unspecified: Secondary | ICD-10-CM | POA: Insufficient documentation

## 2023-01-31 DIAGNOSIS — Z95828 Presence of other vascular implants and grafts: Secondary | ICD-10-CM

## 2023-01-31 DIAGNOSIS — C3431 Malignant neoplasm of lower lobe, right bronchus or lung: Secondary | ICD-10-CM | POA: Diagnosis not present

## 2023-01-31 DIAGNOSIS — G893 Neoplasm related pain (acute) (chronic): Secondary | ICD-10-CM | POA: Insufficient documentation

## 2023-01-31 DIAGNOSIS — R197 Diarrhea, unspecified: Secondary | ICD-10-CM | POA: Insufficient documentation

## 2023-01-31 DIAGNOSIS — Z923 Personal history of irradiation: Secondary | ICD-10-CM | POA: Diagnosis not present

## 2023-01-31 DIAGNOSIS — C3490 Malignant neoplasm of unspecified part of unspecified bronchus or lung: Secondary | ICD-10-CM

## 2023-01-31 DIAGNOSIS — M2550 Pain in unspecified joint: Secondary | ICD-10-CM | POA: Diagnosis not present

## 2023-01-31 DIAGNOSIS — R110 Nausea: Secondary | ICD-10-CM | POA: Diagnosis not present

## 2023-01-31 DIAGNOSIS — C7951 Secondary malignant neoplasm of bone: Secondary | ICD-10-CM | POA: Insufficient documentation

## 2023-01-31 LAB — CMP (CANCER CENTER ONLY)
ALT: 7 U/L (ref 0–44)
AST: 11 U/L — ABNORMAL LOW (ref 15–41)
Albumin: 3.3 g/dL — ABNORMAL LOW (ref 3.5–5.0)
Alkaline Phosphatase: 72 U/L (ref 38–126)
Anion gap: 9 (ref 5–15)
BUN: 18 mg/dL (ref 8–23)
CO2: 26 mmol/L (ref 22–32)
Calcium: 9.4 mg/dL (ref 8.9–10.3)
Chloride: 101 mmol/L (ref 98–111)
Creatinine: 0.85 mg/dL (ref 0.44–1.00)
GFR, Estimated: >60 mL/min (ref >60)
Glucose, Bld: 103 mg/dL — ABNORMAL HIGH (ref 70–99)
Potassium: 4.3 mmol/L (ref 3.5–5.1)
Sodium: 136 mmol/L (ref 135–145)
Total Bilirubin: 0.3 mg/dL (ref 0.3–1.2)
Total Protein: 7 g/dL (ref 6.5–8.1)

## 2023-01-31 LAB — CBC WITH DIFFERENTIAL (CANCER CENTER ONLY)
Abs Immature Granulocytes: 0.01 10*3/uL (ref 0.00–0.07)
Basophils Absolute: 0.1 10*3/uL (ref 0.0–0.1)
Basophils Relative: 1 %
Eosinophils Absolute: 0.1 10*3/uL (ref 0.0–0.5)
Eosinophils Relative: 1 %
HCT: 36.8 % (ref 36.0–46.0)
Hemoglobin: 11.6 g/dL — ABNORMAL LOW (ref 12.0–15.0)
Immature Granulocytes: 0 %
Lymphocytes Relative: 8 %
Lymphs Abs: 0.7 10*3/uL (ref 0.7–4.0)
MCH: 29 pg (ref 26.0–34.0)
MCHC: 31.5 g/dL (ref 30.0–36.0)
MCV: 92 fL (ref 80.0–100.0)
Monocytes Absolute: 0.7 10*3/uL (ref 0.1–1.0)
Monocytes Relative: 8 %
Neutro Abs: 6.8 10*3/uL (ref 1.7–7.7)
Neutrophils Relative %: 82 %
Platelet Count: 191 10*3/uL (ref 150–400)
RBC: 4 MIL/uL (ref 3.87–5.11)
RDW: 16.8 % — ABNORMAL HIGH (ref 11.5–15.5)
WBC Count: 8.3 10*3/uL (ref 4.0–10.5)
nRBC: 0 % (ref 0.0–0.2)

## 2023-01-31 LAB — TOTAL PROTEIN, URINE DIPSTICK: Protein, ur: NEGATIVE mg/dL

## 2023-01-31 LAB — TSH: TSH: 5.991 u[IU]/mL — ABNORMAL HIGH (ref 0.350–4.500)

## 2023-01-31 MED ORDER — SODIUM CHLORIDE 0.9% FLUSH
10.0000 mL | INTRAVENOUS | Status: DC | PRN
  Administered 2023-01-31: 10 mL

## 2023-01-31 NOTE — Telephone Encounter (Signed)
This RN LVM for pt stating that her CT scan has been scheduled and that the appt details will show up in MyChart. This RN also let her know that she will send her a MyChart message with details regarding the appt but to please call [phone] if she has any questions.

## 2023-01-31 NOTE — Progress Notes (Unsigned)
[facility] [phone]   [phone]  PROGRESS NOTE  Patient Care Team: [facility] [place] as PCP - General (Family Medicine)  Hematological/Oncological History # Metastatic Adenosquamous Lung Cancer  05/10/2022: last visit with Dr. Clelia Croft 05/26/2022: CT scan showed enlargement of RLL mass, right iliac crest mass with large soft tissue components extending in the iliacus and gluteus musculature. Additionally new pathologic right external iliac adenopathy.  06/07/2022: transition care to Dr. Leonides Schanz  07/12/2022: Cycle 1 Day 1 of docetaxel and ramucirumab 08/02/2022: Cycle 2 Day 1 of docetaxel and ramucirumab (dose reduced docetaxel from 75 mg/m2 to 60 mg/m2 due to poor tolerance) 08/23/2022: Cycle 3 Day 1 of docetaxel and ramucirumab 10/04/2022: Cycle 4 Day 1 of docetaxel and ramucirumab 11/01/2022: Cycle 5 Day 1 of docetaxel and ramucirumab 11/29/2022: Cycle 6 Day 1 of docetaxel and ramucirumab (dose reduced docetaxel from 60 mg/m2 to 50 mg/m2 due to persistent fatigue) 12/20/2022: Cycle 7 Day 1 of docetaxel and ramucirumab 01/31/2023: held Cycle 8 Day 1 of docetaxel and ramucirumab due to severe congestion/cough  Interval History:  Lauren Salazar 62 y.o. female with medical history significant for metastatic adenosquamous lung cancer presents for a follow up visit. The patient's last visit was on 01/31/2023. She presents today for a follow up before Cycle 8, Day 1  Lauren Salazar reports she has been struggling with respiratory issues in the interim since her last visit.  She reports she was checked for COVID and flu and was negative for both.  She had been on Levaquin antibiotic but she "cannot strictly congestion".  She has missed several visits as a result of this respiratory issue.  She reports that she was producing clear sputum but now it is turning more yellow.  She is not seeing any blood in the sputum.  She reports she has not yet  undergone any x-rays but is due for a CT scan later this month which we will order earlier to better evaluate.  She reports that she has been taking her Tessalon Perles but has not been "breaking up the congestion".  She has not yet tried steroids or albuterol inhalers.  She notes that antibiotic therapy did make her feel better.  Other than the cough and congestion she has not been having any issues with fevers, chills, sweats, nausea, vomiting or diarrhea.  She reports that she is willing to proceed with treatment today but due to clinical judgment we decided to hold.  She denies easy bruising or signs of active bleeding.  She has no other complaints. Rest of the 10 point ROS was otherwise negative.  MEDICAL HISTORY:  Past Medical History:  Diagnosis Date   Bladder tumor    Cancer (HCC)    Complication of anesthesia    slow to wake after spinal fusion, then was "looney" for the following 24 hours    Persistent dry cough    ongoing since feb 2020    SURGICAL HISTORY: Past Surgical History:  Procedure Laterality Date   CERVICAL FUSION  2005   c5-c6      CYSTOSCOPY WITH INJECTION N/A 02/01/2019   Procedure: CYSTOSCOPY WITH INJECTION;  Surgeon: Sebastian Ache, MD;  Location: [facility];  Service: Urology;  Laterality: N/A;   IR IMAGING GUIDED PORT INSERTION  07/25/2022   LYMPHADENECTOMY Bilateral 02/01/2019   Procedure: LYMPHADENECTOMY;  Surgeon: Sebastian Ache, MD;  Location: [facility];  Service: Urology;  Laterality: Bilateral;   ROBOT ASSISTED LAPAROSCOPIC COMPLETE CYSTECT ILEAL CONDUIT  N/A 02/01/2019   Procedure: XI ROBOTIC ASSISTED LAPAROSCOPIC COMPLETE CYSTECTOMY, ILEAL CONDUIT, HYSTERECTOMY WITH BILATERAL SALPINGOOPHORECTOMY;  Surgeon: Sebastian Ache, MD;  Location: [facility];  Service: Urology;  Laterality: N/A;  6 HRS   TRANSURETHRAL RESECTION OF BLADDER TUMOR N/A 10/31/2018   Procedure: TRANSURETHRAL RESECTION OF BLADDER TUMOR (TURBT) WITH CYSTOSCOPY/ POSSIBLE INTRAVESICAL GEMCITABINE;  Surgeon:  Rene Paci, MD;  Location: [facility];  Service: Urology;  Laterality: N/A;    SOCIAL HISTORY: Social History   Socioeconomic History   Marital status: Divorced    Spouse name: Not on file   Number of children: Not on file   Years of education: Not on file   Highest education level: Not on file  Occupational History   Not on file  Tobacco Use   Smoking status: Every Day    Current packs/day: 1.00    Types: Cigarettes   Smokeless tobacco: Never   Tobacco comments:    smoking since age 52 , she is cutting back, using patches, trying to stop. 04/02/19  Vaping Use   Vaping status: Never Used  Substance and Sexual Activity   Alcohol use: No   Drug use: No   Sexual activity: Not Currently  Other Topics Concern   Not on file  Social History Narrative   Not on file   Social Determinants of Health   Financial Resource Strain: Not on file  Food Insecurity: Not on file  Transportation Needs: No Transportation Needs (04/02/2019)   PRAPARE - Administrator, Civil Service (Medical): No    Lack of Transportation (Non-Medical): No  Physical Activity: Not on file  Stress: Not on file  Social Connections: Not on file  Intimate Partner Violence: Not At Risk (04/02/2019)   Humiliation, Afraid, Rape, and Kick questionnaire    Fear of Current or Ex-Partner: No    Emotionally Abused: No    Physically Abused: No    Sexually Abused: No    FAMILY HISTORY: No family history on file.  ALLERGIES:  is allergic to celecoxib.  MEDICATIONS:  Current Outpatient Medications  Medication Sig Dispense Refill   acetaminophen (TYLENOL) 500 MG tablet 1 tablet as needed Orally every 6 hrs     benzonatate (TESSALON) 100 MG capsule Take 2 capsules (200 mg total) by mouth 3 (three) times daily as needed for cough. 20 capsule 2   diphenoxylate-atropine ([name]) 2.5-0.025 MG tablet Take 1 tablet by mouth 4 (four) times daily as needed for diarrhea or loose stools. 30 tablet 0    folic acid (FOLVITE) 1 MG tablet TAKE 1 TABLET(1 MG) BY MOUTH DAILY 90 tablet 3   HYDROcodone-acetaminophen (NORCO) 5-325 MG tablet Take 1 tablet by mouth every 4 (four) hours as needed for moderate pain. 120 tablet 0   lidocaine (LIDODERM) 5 % Place 1 patch onto the skin daily. Remove & Discard patch within 12 hours or as directed by MD 30 patch 0   lidocaine-prilocaine (EMLA) cream Apply 1 Application topically as needed. 30 g 0   magic mouthwash (nystatin, diphenhydrAMINE, alum & mag hydroxide) suspension mixture Swish and swallow 10 mLs 3 (three) times daily. 240 mL 2   meloxicam (MOBIC) 15 MG tablet Take 1 tablet (15 mg total) by mouth daily. 30 tablet 0   nicotine (NICODERM CQ - DOSED IN MG/24 HOURS) 21 mg/24hr patch Place 1 patch (21 mg total) onto the skin daily. 28 patch 1   prochlorperazine (COMPAZINE) 10 MG tablet Take 1 tablet (10 mg total) by mouth  every 6 (six) hours as needed for nausea or vomiting. 30 tablet 2   No current facility-administered medications for this visit.    REVIEW OF SYSTEMS:   Constitutional: ( - ) fevers, ( - )  chills , ( - ) night sweats Eyes: ( - ) blurriness of vision, ( - ) double vision, ( - ) watery eyes Ears, nose, mouth, throat, and face: ( - ) mucositis, ( - ) sore throat Respiratory: (+ ) cough, ( - ) dyspnea, ( - ) wheezes Cardiovascular: ( - ) palpitation, ( - ) chest discomfort, ( - ) lower extremity swelling Gastrointestinal:  ( + ) nausea, ( - ) heartburn, ( -) change in bowel habits Skin: ( - ) abnormal skin rashes Lymphatics: ( - ) new lymphadenopathy, ( - ) easy bruising Neurological: ( - ) numbness, ( - ) tingling, ( - ) new weaknesses Behavioral/Psych: ( - ) mood change, ( - ) new changes  All other systems were reviewed with the patient and are negative.  PHYSICAL EXAMINATION: ECOG PERFORMANCE STATUS: 1 - Symptomatic but completely ambulatory  Vitals:   01/31/23 1300  BP: (!) 153/91  Pulse: (!) 118  Resp: 17  Temp: 98.2 F  (36.8 C)  SpO2: 99%    Repeat HR is 105 bpm.   Filed Weights   01/31/23 1300  Weight: 108 lb 3.2 oz (49.1 kg)     GENERAL: Chronically ill-appearing middle-aged Caucasian female, alert, no distress and comfortable SKIN: skin color, texture, turgor are normal, no rashes or significant lesions EYES: conjunctiva are pink and non-injected, sclera clear LUNGS: clear to auscultation and percussion with normal breathing effort HEART: regular rate & rhythm and no murmurs and no lower extremity edema Musculoskeletal: no cyanosis of digits and no clubbing  PSYCH: alert & oriented x 3, fluent speech NEURO: no focal motor/sensory deficits  LABORATORY DATA:  I have reviewed the data as listed    Latest Ref Rng & Units 01/31/2023   12:14 PM 12/20/2022   12:16 PM 11/29/2022    9:05 AM  CBC  WBC 4.0 - 10.5 K/uL 8.3  8.5  8.6   Hemoglobin 12.0 - 15.0 g/dL 96.0  45.4  09.8   Hematocrit 36.0 - 46.0 % 36.8  41.3  39.8   Platelets 150 - 400 K/uL 191  218  252        Latest Ref Rng & Units 01/31/2023   12:14 PM 12/20/2022   12:16 PM 11/29/2022    9:05 AM  CMP  Glucose 70 - 99 mg/dL 119  147  829   BUN 8 - 23 mg/dL 18  22  27    Creatinine 0.44 - 1.00 mg/dL 5.62  1.30  8.65   Sodium 135 - 145 mmol/L 136  140  140   Potassium 3.5 - 5.1 mmol/L 4.3  4.1  4.3   Chloride 98 - 111 mmol/L 101  106  104   CO2 22 - 32 mmol/L 26  27  28    Calcium 8.9 - 10.3 mg/dL 9.4  9.0  8.4   Total Protein 6.5 - 8.1 g/dL 7.0  6.8  6.6   Total Bilirubin 0.3 - 1.2 mg/dL 0.3  0.3  0.3   Alkaline Phos 38 - 126 U/L 72  82  78   AST 15 - 41 U/L 11  12  12    ALT 0 - 44 U/L 7  7  6      RADIOGRAPHIC STUDIES: No results  found.  ASSESSMENT & PLAN SHEREDA GRAW is a 62 y.o. female with medical history significant for metastatic adenosquamous lung cancer presents for a follow up visit.  She is status post robotic assisted laparoscopic cystectomy and pelvic exoneration including hysterectomy, bilateral  salpingo-oophorectomy and ileoconduit diversion completed on February 01, 2019.  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   Radiation therapy with weekly chemotherapy utilizing carboplatin and paclitaxel.  Week 1 of chemotherapy started on May 14, 2019.  Therapy concluded in February 2021.   Durvalumab 10 mg/kg cycle 1 on Sep 03, 2019 every 14 days.  She completed 4 cycles of therapy.   Carboplatin with Alimta and Pembrolizumab started on August 04, 2020.  She completed 3 cycles of therapy in June 2022.   Pembrolizumab 400 mg every 6 weeks started on December 22, 2020.  Therapy discontinued and March 2023 due to patient's preference.  Progression noted on CT scan from 05/26/2022.   Completed palliative radiation to her R hip/pelvis on 07/01/2022. Received 30 Gy in 10 Fx.   Started Docetaxel and Ramucirumab on 07/12/2022.  # Metastatic Adenosquamous Carcinoma of the Lung  --patient has undergone numerous lines of treatment including chemoradiation followed by immunotherapy, subsequent carbo/pem/pem and then monotherapy pembrolizumab --progression of disease noted on 05/26/2022 CT scan. --Started docetaxel plus ramucirumab on 07/12/2022 --Due to poor tolerance with Cycle 1, dose reduced Doxcetaxel from 75 mg/m2 to 60 mg/m2 starting Cycle 2.  --Due to persistent fatigue, dose reduced docetaxel from 60 mg/m2 to 50 mg/m2 starting Cycle 6 on 11/29/2022.  --Most CT scan from 11/18/2022 continued treatment response with stable RLL lung mass, decrease in size of right adrenal nodule, left retroperitoneal lymph node and sclerosis of bone metastases. No evidence of new or progressive disease.  Plan: -- today is Cycle 8 Day 1 of Docetaxel/Ramucircumab -- labs today show white blood cell 8.3, hemoglobin 11.6, MCV 92, and platelets of 191. Creatinine and LFTs normal.  --last CT scan on 11/18/2022 showed response to therapy.  Scan was due in late October 2024 but are ordering this  early in order to better assess her lungs -- If no concerning findings for infection on CT scan of the lungs we will order a steroid pulse and consider albuterol inhaler --RTC in 1-2 weeks with labs, follow up before Cycle 8, Day 1 Docetaxel/Ramucircumab.  # Abdominal Pain/ Cancer Related Pain --Prescribed oxycodone 5 mg as needed, however patient avoids taking this as it causes her to be "loopy". --Patient completed palliative radiation with radiation oncology to right hip/pelvis with improvement of pain --Patient is c/o right sided mid back pain/hip pain. Currently take Norco 5-325 mg nightly to help her sleep but pain relief is short term.  --continue long acting pain medication with Xtampza 13.5 mg q 12 hours.   #Nausea: --Added IV emend to her pre-meds --Continue to take zofran and compazine as needed.   #Diarrhea: --Secondary to chemotherapy --Continue to take imodium as needed.   #Joint pain 2/2 GCSF injection: --Recommend to take Claritin for several days with injection to minimize side effects.    Orders Placed This Encounter  Procedures   CT CHEST ABDOMEN PELVIS W CONTRAST    Standing Status:   Future    Standing Expiration Date:   01/31/2024    Order Specific Question:   If indicated for the ordered procedure, I authorize the administration of contrast media per Radiology protocol    Answer:   Yes  Order Specific Question:   Does the patient have a contrast media/X-ray dye allergy?    Answer:   No    Order Specific Question:   Preferred imaging location?    Answer:   [facility]    Order Specific Question:   If indicated for the ordered procedure, I authorize the administration of oral contrast media per Radiology protocol    Answer:   Yes    All questions were answered. The patient knows to call the clinic with any problems, questions or concerns.  A total of more than 30 minutes were spent on this encounter with face-to-face time and non-face-to-face  time, including preparing to see the patient, ordering tests and/or medications, counseling the patient and coordination of care as outlined above.   Ulysees Barns, MD Department of Hematology/Oncology [facility] Phone: [phone] Pager: [phone] Email: [email or web address]   02/01/2023 8:42 PM

## 2023-02-01 ENCOUNTER — Telehealth: Payer: Self-pay | Admitting: Hematology and Oncology

## 2023-02-01 ENCOUNTER — Telehealth: Payer: Self-pay

## 2023-02-01 ENCOUNTER — Encounter: Payer: Self-pay | Admitting: Hematology and Oncology

## 2023-02-01 ENCOUNTER — Encounter: Payer: Self-pay | Admitting: Physician Assistant

## 2023-02-01 LAB — T4: T4, Total: 6.5 ug/dL (ref 4.5–12.0)

## 2023-02-01 NOTE — Telephone Encounter (Signed)
This RN called pt to follow up due to a scheduling team member reporting to this RN that the pt has requested a prescription for cough control. Pt states that she has had a lot of congestion and phlegm and would like a prescription to help with it. Pt denies fever, chills, SOB and any other respiratory issues. Pt states she has fatigue, and that her "stomach and lungs hurt."  This RN reached out to [place], [place]. It was suggested for the pt to take OTC Mucinex. Pt verbalized understanding. Per [name], [place], this RN asked pt to come in for CXR today to be evaluated. Pt declined coming in due to having CT scheduled for 10/14. Pt states that she will try the Mucinex to see if it will help.  This RN informed pt to please give [place] a call back if she does not notice improvement.

## 2023-02-02 ENCOUNTER — Inpatient Hospital Stay: Payer: Medicaid Other

## 2023-02-06 ENCOUNTER — Ambulatory Visit (HOSPITAL_COMMUNITY)
Admission: RE | Admit: 2023-02-06 | Discharge: 2023-02-06 | Disposition: A | Discharge status: Home / Self Care | Payer: Medicaid Other | Source: Ambulatory Visit | Attending: Hematology and Oncology | Admitting: Hematology and Oncology

## 2023-02-06 DIAGNOSIS — C3490 Malignant neoplasm of unspecified part of unspecified bronchus or lung: Secondary | ICD-10-CM | POA: Diagnosis present

## 2023-02-06 MED ORDER — IOHEXOL 300 MG/ML  SOLN
100.0000 mL | Freq: Once | INTRAMUSCULAR | Status: AC | PRN
  Administered 2023-02-06: 100 mL via INTRAVENOUS

## 2023-02-14 ENCOUNTER — Encounter: Payer: Self-pay | Admitting: Hematology and Oncology

## 2023-02-14 ENCOUNTER — Inpatient Hospital Stay: Payer: Medicaid Other

## 2023-02-14 ENCOUNTER — Inpatient Hospital Stay (HOSPITAL_BASED_OUTPATIENT_CLINIC_OR_DEPARTMENT_OTHER): Payer: Medicaid Other | Admitting: Hematology and Oncology

## 2023-02-14 VITALS — BP 141/78 | HR 119 | Temp 99.0°F | Resp 20 | Wt 106.0 lb

## 2023-02-14 VITALS — HR 117

## 2023-02-14 DIAGNOSIS — C3490 Malignant neoplasm of unspecified part of unspecified bronchus or lung: Secondary | ICD-10-CM

## 2023-02-14 DIAGNOSIS — Z95828 Presence of other vascular implants and grafts: Secondary | ICD-10-CM

## 2023-02-14 DIAGNOSIS — Z5111 Encounter for antineoplastic chemotherapy: Secondary | ICD-10-CM

## 2023-02-14 DIAGNOSIS — C3431 Malignant neoplasm of lower lobe, right bronchus or lung: Secondary | ICD-10-CM | POA: Diagnosis not present

## 2023-02-14 LAB — CBC WITH DIFFERENTIAL (CANCER CENTER ONLY)
Abs Immature Granulocytes: 0.02 10*3/uL (ref 0.00–0.07)
Basophils Absolute: 0 10*3/uL (ref 0.0–0.1)
Basophils Relative: 1 %
Eosinophils Absolute: 0.1 10*3/uL (ref 0.0–0.5)
Eosinophils Relative: 2 %
HCT: 35.6 % — ABNORMAL LOW (ref 36.0–46.0)
Hemoglobin: 11 g/dL — ABNORMAL LOW (ref 12.0–15.0)
Immature Granulocytes: 0 %
Lymphocytes Relative: 9 %
Lymphs Abs: 0.5 10*3/uL — ABNORMAL LOW (ref 0.7–4.0)
MCH: 28.5 pg (ref 26.0–34.0)
MCHC: 30.9 g/dL (ref 30.0–36.0)
MCV: 92.2 fL (ref 80.0–100.0)
Monocytes Absolute: 0.7 10*3/uL (ref 0.1–1.0)
Monocytes Relative: 12 %
Neutro Abs: 4.5 10*3/uL (ref 1.7–7.7)
Neutrophils Relative %: 76 %
Platelet Count: 187 10*3/uL (ref 150–400)
RBC: 3.86 MIL/uL — ABNORMAL LOW (ref 3.87–5.11)
RDW: 16.7 % — ABNORMAL HIGH (ref 11.5–15.5)
WBC Count: 5.9 10*3/uL (ref 4.0–10.5)
nRBC: 0 % (ref 0.0–0.2)

## 2023-02-14 LAB — CMP (CANCER CENTER ONLY)
ALT: 9 U/L (ref 0–44)
AST: 12 U/L — ABNORMAL LOW (ref 15–41)
Albumin: 3.3 g/dL — ABNORMAL LOW (ref 3.5–5.0)
Alkaline Phosphatase: 75 U/L (ref 38–126)
Anion gap: 10 (ref 5–15)
BUN: 17 mg/dL (ref 8–23)
CO2: 29 mmol/L (ref 22–32)
Calcium: 9.3 mg/dL (ref 8.9–10.3)
Chloride: 101 mmol/L (ref 98–111)
Creatinine: 0.99 mg/dL (ref 0.44–1.00)
GFR, Estimated: >60 mL/min (ref >60)
Glucose, Bld: 151 mg/dL — ABNORMAL HIGH (ref 70–99)
Potassium: 3.8 mmol/L (ref 3.5–5.1)
Sodium: 140 mmol/L (ref 135–145)
Total Bilirubin: 0.3 mg/dL (ref 0.3–1.2)
Total Protein: 7 g/dL (ref 6.5–8.1)

## 2023-02-14 LAB — TSH: TSH: 5.396 u[IU]/mL — ABNORMAL HIGH (ref 0.350–4.500)

## 2023-02-14 MED ORDER — SODIUM CHLORIDE 0.9% FLUSH
10.0000 mL | INTRAVENOUS | Status: DC | PRN
  Administered 2023-02-14: 10 mL

## 2023-02-14 MED ORDER — HEPARIN SOD (PORK) LOCK FLUSH 100 UNIT/ML IV SOLN
500.0000 [IU] | Freq: Once | INTRAVENOUS | Status: AC | PRN
  Administered 2023-02-14: 500 [IU]

## 2023-02-14 MED ORDER — DIPHENHYDRAMINE HCL 50 MG/ML IJ SOLN
50.0000 mg | Freq: Once | INTRAMUSCULAR | Status: AC
  Administered 2023-02-14: 50 mg via INTRAVENOUS
  Filled 2023-02-14: qty 1

## 2023-02-14 MED ORDER — DEXAMETHASONE SODIUM PHOSPHATE 100 MG/10ML IJ SOLN
10.0000 mg | Freq: Once | INTRAMUSCULAR | Status: AC
  Administered 2023-02-14: 10 mg via INTRAVENOUS
  Filled 2023-02-14: qty 10

## 2023-02-14 MED ORDER — SODIUM CHLORIDE 0.9 % IV SOLN: Freq: Once | INTRAVENOUS | Status: AC

## 2023-02-14 MED ORDER — SODIUM CHLORIDE 0.9 % IV SOLN
50.0000 mg/m2 | Freq: Once | INTRAVENOUS | Status: AC
  Administered 2023-02-14: 80 mg via INTRAVENOUS
  Filled 2023-02-14: qty 8

## 2023-02-14 MED ORDER — SODIUM CHLORIDE 0.9 % IV SOLN
10.0000 mg/kg | Freq: Once | INTRAVENOUS | Status: AC
  Administered 2023-02-14: 500 mg via INTRAVENOUS
  Filled 2023-02-14: qty 50

## 2023-02-14 MED ORDER — ACETAMINOPHEN 325 MG PO TABS
650.0000 mg | ORAL_TABLET | Freq: Once | ORAL | Status: AC
  Administered 2023-02-14: 650 mg via ORAL
  Filled 2023-02-14: qty 2

## 2023-02-14 MED ORDER — SODIUM CHLORIDE 0.9 % IV SOLN
150.0000 mg | Freq: Once | INTRAVENOUS | Status: AC
  Administered 2023-02-14: 150 mg via INTRAVENOUS
  Filled 2023-02-14: qty 150

## 2023-02-14 MED ORDER — KETOROLAC TROMETHAMINE 30 MG/ML IJ SOLN
30.0000 mg | Freq: Once | INTRAMUSCULAR | Status: AC
  Administered 2023-02-14: 30 mg via INTRAVENOUS
  Filled 2023-02-14: qty 1

## 2023-02-14 NOTE — Progress Notes (Signed)
Per Dr. Leonides Schanz, okay to treat with HR 117.

## 2023-02-14 NOTE — Progress Notes (Signed)
[facility] [phone]   [phone]  PROGRESS NOTE  Patient Care Team: [facility] [place] as PCP - General (Family Medicine)  Hematological/Oncological History # Metastatic Adenosquamous Lung Cancer  05/10/2022: last visit with Dr. Clelia Croft 05/26/2022: CT scan showed enlargement of RLL mass, right iliac crest mass with large soft tissue components extending in the iliacus and gluteus musculature. Additionally new pathologic right external iliac adenopathy.  06/07/2022: transition care to Dr. Leonides Schanz  07/12/2022: Cycle 1 Day 1 of docetaxel and ramucirumab 08/02/2022: Cycle 2 Day 1 of docetaxel and ramucirumab (dose reduced docetaxel from 75 mg/m2 to 60 mg/m2 due to poor tolerance) 08/23/2022: Cycle 3 Day 1 of docetaxel and ramucirumab 10/04/2022: Cycle 4 Day 1 of docetaxel and ramucirumab 11/01/2022: Cycle 5 Day 1 of docetaxel and ramucirumab 11/29/2022: Cycle 6 Day 1 of docetaxel and ramucirumab (dose reduced docetaxel from 60 mg/m2 to 50 mg/m2 due to persistent fatigue) 12/20/2022: Cycle 7 Day 1 of docetaxel and ramucirumab 01/31/2023: held Cycle 8 Day 1 of docetaxel and ramucirumab due to severe congestion/cough  Interval History:  Lauren HUBLEY 62 y.o. female with medical history significant for metastatic adenosquamous lung cancer presents for a follow up visit. The patient's last visit was on 01/31/2023. She presents today for a follow up before Cycle 8, Day 1  Ms. Ferraris reports she feels improved in the interim since her last visit.  She reports that she is still coughing a lot but is producing clear/pale yellow sputum.  She reports is mostly in the morning.  She notes that it sometimes can wake her up at night.  She has been taking Mucinex with little help but is seeing more benefit from Benadryl.  She reports that her hip lesion is causing less and less pain and that she is using her cane more than her walker.  She reports she is  not having any pain in the lung.  She reports that she looks forward to trick-or-treating with her grandchildren next week.  She reports she is lost another 4 pounds in the interim since our last visit.  She feels like she is eating well and is unsure why she is losing weight.  She has not been having any issues with fevers, chills, sweats, nausea, vomiting or diarrhea.  She reports that she is willing to proceed with treatment today.  She denies easy bruising or signs of active bleeding.  She has no other complaints. Rest of the 10 point ROS was otherwise negative.  MEDICAL HISTORY:  Past Medical History:  Diagnosis Date   Bladder tumor    Cancer (HCC)    Complication of anesthesia    slow to wake after spinal fusion, then was "looney" for the following 24 hours    Persistent dry cough    ongoing since feb 2020    SURGICAL HISTORY: Past Surgical History:  Procedure Laterality Date   CERVICAL FUSION  2005   c5-c6      CYSTOSCOPY WITH INJECTION N/A 02/01/2019   Procedure: CYSTOSCOPY WITH INJECTION;  Surgeon: Sebastian Ache, MD;  Location: [facility];  Service: Urology;  Laterality: N/A;   IR IMAGING GUIDED PORT INSERTION  07/25/2022   LYMPHADENECTOMY Bilateral 02/01/2019   Procedure: LYMPHADENECTOMY;  Surgeon: Sebastian Ache, MD;  Location: [facility];  Service: Urology;  Laterality: Bilateral;   ROBOT ASSISTED LAPAROSCOPIC COMPLETE CYSTECT ILEAL CONDUIT N/A 02/01/2019   Procedure: XI ROBOTIC ASSISTED LAPAROSCOPIC COMPLETE CYSTECTOMY, ILEAL CONDUIT, HYSTERECTOMY WITH BILATERAL SALPINGOOPHORECTOMY;  Surgeon:  Sebastian Ache, MD;  Location: [facility];  Service: Urology;  Laterality: N/A;  6 HRS   TRANSURETHRAL RESECTION OF BLADDER TUMOR N/A 10/31/2018   Procedure: TRANSURETHRAL RESECTION OF BLADDER TUMOR (TURBT) WITH CYSTOSCOPY/ POSSIBLE INTRAVESICAL GEMCITABINE;  Surgeon: Rene Paci, MD;  Location: [facility];  Service: Urology;  Laterality: N/A;    SOCIAL HISTORY: Social History    Socioeconomic History   Marital status: Divorced    Spouse name: Not on file   Number of children: Not on file   Years of education: Not on file   Highest education level: Not on file  Occupational History   Not on file  Tobacco Use   Smoking status: Every Day    Current packs/day: 1.00    Types: Cigarettes   Smokeless tobacco: Never   Tobacco comments:    smoking since age 39 , she is cutting back, using patches, trying to stop. 04/02/19  Vaping Use   Vaping status: Never Used  Substance and Sexual Activity   Alcohol use: No   Drug use: No   Sexual activity: Not Currently  Other Topics Concern   Not on file  Social History Narrative   Not on file   Social Determinants of Health   Financial Resource Strain: Not on file  Food Insecurity: Not on file  Transportation Needs: No Transportation Needs (04/02/2019)   PRAPARE - Administrator, Civil Service (Medical): No    Lack of Transportation (Non-Medical): No  Physical Activity: Not on file  Stress: Not on file  Social Connections: Not on file  Intimate Partner Violence: Not At Risk (04/02/2019)   Humiliation, Afraid, Rape, and Kick questionnaire    Fear of Current or Ex-Partner: No    Emotionally Abused: No    Physically Abused: No    Sexually Abused: No    FAMILY HISTORY: No family history on file.  ALLERGIES:  is allergic to celecoxib.  MEDICATIONS:  Current Outpatient Medications  Medication Sig Dispense Refill   acetaminophen (TYLENOL) 500 MG tablet 1 tablet as needed Orally every 6 hrs     benzonatate (TESSALON) 100 MG capsule Take 2 capsules (200 mg total) by mouth 3 (three) times daily as needed for cough. 20 capsule 2   diphenoxylate-atropine ([name]) 2.5-0.025 MG tablet Take 1 tablet by mouth 4 (four) times daily as needed for diarrhea or loose stools. 30 tablet 0   folic acid (FOLVITE) 1 MG tablet TAKE 1 TABLET(1 MG) BY MOUTH DAILY 90 tablet 3   HYDROcodone-acetaminophen (NORCO) 5-325 MG  tablet Take 1 tablet by mouth every 4 (four) hours as needed for moderate pain. 120 tablet 0   lidocaine (LIDODERM) 5 % Place 1 patch onto the skin daily. Remove & Discard patch within 12 hours or as directed by MD 30 patch 0   lidocaine-prilocaine (EMLA) cream Apply 1 Application topically as needed. 30 g 0   magic mouthwash (nystatin, diphenhydrAMINE, alum & mag hydroxide) suspension mixture Swish and swallow 10 mLs 3 (three) times daily. 240 mL 2   meloxicam (MOBIC) 15 MG tablet Take 1 tablet (15 mg total) by mouth daily. 30 tablet 0   nicotine (NICODERM CQ - DOSED IN MG/24 HOURS) 21 mg/24hr patch Place 1 patch (21 mg total) onto the skin daily. 28 patch 1   prochlorperazine (COMPAZINE) 10 MG tablet Take 1 tablet (10 mg total) by mouth every 6 (six) hours as needed for nausea or vomiting. 30 tablet 2   No current facility-administered medications  for this visit.   Facility-Administered Medications Ordered in Other Visits  Medication Dose Route Frequency Provider Last Rate Last Admin   dexamethasone (DECADRON) 10 mg in sodium chloride 0.9 % 50 mL IVPB  10 mg Intravenous Once Jaci Standard, MD       DOCEtaxel (TAXOTERE) 80 mg in sodium chloride 0.9 % 250 mL chemo infusion  50 mg/m2 (Treatment Plan Recorded) Intravenous Once Jaci Standard, MD       fosaprepitant (EMEND) 150 mg in sodium chloride 0.9 % 145 mL IVPB  150 mg Intravenous Once Ulysees Barns IV, MD 450 mL/hr at 02/14/23 1315 150 mg at 02/14/23 1315   heparin lock flush 100 unit/mL  500 Units Intracatheter Once PRN Jaci Standard, MD       ramucirumab [facility]) 500 mg in sodium chloride 0.9 % 200 mL chemo infusion  10 mg/kg (Order-Specific) Intravenous Once Ulysees Barns IV, MD       sodium chloride flush (NS) 0.9 % injection 10 mL  10 mL Intracatheter PRN Jaci Standard, MD        REVIEW OF SYSTEMS:   Constitutional: ( - ) fevers, ( - )  chills , ( - ) night sweats Eyes: ( - ) blurriness of vision, ( - ) double vision,  ( - ) watery eyes Ears, nose, mouth, throat, and face: ( - ) mucositis, ( - ) sore throat Respiratory: (+ ) cough, ( - ) dyspnea, ( - ) wheezes Cardiovascular: ( - ) palpitation, ( - ) chest discomfort, ( - ) lower extremity swelling Gastrointestinal:  ( + ) nausea, ( - ) heartburn, ( -) change in bowel habits Skin: ( - ) abnormal skin rashes Lymphatics: ( - ) new lymphadenopathy, ( - ) easy bruising Neurological: ( - ) numbness, ( - ) tingling, ( - ) new weaknesses Behavioral/Psych: ( - ) mood change, ( - ) new changes  All other systems were reviewed with the patient and are negative.  PHYSICAL EXAMINATION: ECOG PERFORMANCE STATUS: 1 - Symptomatic but completely ambulatory  Vitals:   02/14/23 1121  BP: (!) 141/78  Pulse: (!) 119  Resp: 20  Temp: 99 F (37.2 C)     Repeat HR is 105 bpm.   Filed Weights   02/14/23 1121  Weight: 106 lb (48.1 kg)      GENERAL: Chronically ill-appearing middle-aged Caucasian female, alert, no distress and comfortable SKIN: skin color, texture, turgor are normal, no rashes or significant lesions EYES: conjunctiva are pink and non-injected, sclera clear LUNGS: clear to auscultation and percussion with normal breathing effort HEART: regular rate & rhythm and no murmurs and no lower extremity edema Musculoskeletal: no cyanosis of digits and no clubbing  PSYCH: alert & oriented x 3, fluent speech NEURO: no focal motor/sensory deficits  LABORATORY DATA:  I have reviewed the data as listed    Latest Ref Rng & Units 02/14/2023   11:02 AM 01/31/2023   12:14 PM 12/20/2022   12:16 PM  CBC  WBC 4.0 - 10.5 K/uL 5.9  8.3  8.5   Hemoglobin 12.0 - 15.0 g/dL 16.1  09.6  04.5   Hematocrit 36.0 - 46.0 % 35.6  36.8  41.3   Platelets 150 - 400 K/uL 187  191  218        Latest Ref Rng & Units 02/14/2023   11:02 AM 01/31/2023   12:14 PM 12/20/2022   12:16 PM  CMP  Glucose 70 - 99 mg/dL 846  962  952   BUN 8 - 23 mg/dL 17  18  22    Creatinine 0.44 -  1.00 mg/dL 8.41  3.24  4.01   Sodium 135 - 145 mmol/L 140  136  140   Potassium 3.5 - 5.1 mmol/L 3.8  4.3  4.1   Chloride 98 - 111 mmol/L 101  101  106   CO2 22 - 32 mmol/L 29  26  27    Calcium 8.9 - 10.3 mg/dL 9.3  9.4  9.0   Total Protein 6.5 - 8.1 g/dL 7.0  7.0  6.8   Total Bilirubin 0.3 - 1.2 mg/dL 0.3  0.3  0.3   Alkaline Phos 38 - 126 U/L 75  72  82   AST 15 - 41 U/L 12  11  12    ALT 0 - 44 U/L 9  7  7      RADIOGRAPHIC STUDIES: No results found.  ASSESSMENT & PLAN Lauren Salazar is a 62 y.o. female with medical history significant for metastatic adenosquamous lung cancer presents for a follow up visit.  She is status post robotic assisted laparoscopic cystectomy and pelvic exoneration including hysterectomy, bilateral salpingo-oophorectomy and ileoconduit diversion completed on February 01, 2019.  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   Radiation therapy with weekly chemotherapy utilizing carboplatin and paclitaxel.  Week 1 of chemotherapy started on May 14, 2019.  Therapy concluded in February 2021.   Durvalumab 10 mg/kg cycle 1 on Sep 03, 2019 every 14 days.  She completed 4 cycles of therapy.   Carboplatin with Alimta and Pembrolizumab started on August 04, 2020.  She completed 3 cycles of therapy in June 2022.   Pembrolizumab 400 mg every 6 weeks started on December 22, 2020.  Therapy discontinued and March 2023 due to patient's preference.  Progression noted on CT scan from 05/26/2022.   Completed palliative radiation to her R hip/pelvis on 07/01/2022. Received 30 Gy in 10 Fx.   Started Docetaxel and Ramucirumab on 07/12/2022.  # Metastatic Adenosquamous Carcinoma of the Lung  --patient has undergone numerous lines of treatment including chemoradiation followed by immunotherapy, subsequent carbo/pem/pem and then monotherapy pembrolizumab --progression of disease noted on 05/26/2022 CT scan. --Started docetaxel plus ramucirumab on  07/12/2022 --Due to poor tolerance with Cycle 1, dose reduced Doxcetaxel from 75 mg/m2 to 60 mg/m2 starting Cycle 2.  --Due to persistent fatigue, dose reduced docetaxel from 60 mg/m2 to 50 mg/m2 starting Cycle 6 on 11/29/2022.  --Most CT scan from 11/18/2022 continued treatment response with stable RLL lung mass, decrease in size of right adrenal nodule, left retroperitoneal lymph node and sclerosis of bone metastases. No evidence of new or progressive disease.  Plan: -- today is Cycle 8 Day 1 of Docetaxel/Ramucircumab -- labs today show white blood cell 5.9, hemoglobin 11.0, MCV 92.2, and platelets of 187.  Creatinine and LFTs normal.  --last CT scan on 11/18/2022 showed response to therapy.  Scan performed earlier this month, still waiting on the read. -- If no concerning findings for infection on CT scan of the lungs we will order a steroid pulse and consider albuterol inhaler --RTC in 3 weeks with labs, follow up before Cycle 9, Day 1 Docetaxel/Ramucircumab.  # Abdominal Pain/ Cancer Related Pain --Prescribed oxycodone 5 mg as needed, however patient avoids taking this as it causes her to be "loopy". --Patient completed palliative radiation with radiation oncology to right hip/pelvis with  improvement of pain --Patient is c/o right sided mid back pain/hip pain. Currently take Norco 5-325 mg nightly to help her sleep but pain relief is short term.  --continue long acting pain medication with Xtampza 13.5 mg q 12 hours.   #Nausea: --Added IV emend to her pre-meds --Continue to take zofran and compazine as needed.   #Diarrhea: --Secondary to chemotherapy --Continue to take imodium as needed.   #Joint pain 2/2 GCSF injection: --Recommend to take Claritin for several days with injection to minimize side effects.    No orders of the defined types were placed in this encounter.   All questions were answered. The patient knows to call the clinic with any problems, questions or concerns.  A  total of more than 30 minutes were spent on this encounter with face-to-face time and non-face-to-face time, including preparing to see the patient, ordering tests and/or medications, counseling the patient and coordination of care as outlined above.   Ulysees Barns, MD Department of Hematology/Oncology [facility] Phone: [phone] Pager: [phone] Email: [email or web address]   02/14/2023 1:16 PM

## 2023-02-15 ENCOUNTER — Encounter: Payer: Self-pay | Admitting: Hematology and Oncology

## 2023-02-15 NOTE — Progress Notes (Signed)
Udenyca unavailable due to manufacturer out of stock. Ok to change [place] to Neulasta per insurance preferred biosimilars.  Anola Gurney [place], [place], BCPS, BCOP 02/15/2023 1:52 PM

## 2023-02-16 ENCOUNTER — Inpatient Hospital Stay: Payer: Medicaid Other

## 2023-02-16 ENCOUNTER — Telehealth: Payer: Self-pay | Admitting: *Deleted

## 2023-02-16 NOTE — Telephone Encounter (Signed)
TCT patient regarding her appt for Udenyca scheduled for today. Pt has not shown up for that appt. TCT patient. Spoke with her. She states she gets the injection every other treatment and that the last time she got the injection, she got the flu. Pt states she won't be coming in today.  Dr. Leonides Schanz made aware.

## 2023-03-06 ENCOUNTER — Encounter: Payer: Self-pay | Admitting: Hematology and Oncology

## 2023-03-06 NOTE — Progress Notes (Deleted)
Palliative Medicine [facility]  [phone] [phone]   Name: Lauren Salazar Date: 03/06/2023 MRN: [id number]  DOB: June 02, 1960  Patient Care Team: [facility] [place] as PCP - General (Family Medicine)   INTERVAL HISTORY: Lauren Salazar is a 62 y.o. 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 Patient is also status post robotic assisted laparoscopic cystectomy and pelvic exoneration including hysterectomy, bilateral oophorectomy and ileoconduit diversion. Palliative ask to see for symptom and pain management and goals of care.   SOCIAL HISTORY:    Lauren Salazar reports that she has been smoking cigarettes. She has never used smokeless tobacco. She reports that she does not drink alcohol and does not use drugs.  ADVANCE DIRECTIVES:  None on file  CODE STATUS: Full code  PAST MEDICAL HISTORY: Past Medical History:  Diagnosis Date   Bladder tumor    Cancer (HCC)    Complication of anesthesia    slow to wake after spinal fusion, then was "looney" for the following 24 hours    Persistent dry cough    ongoing since feb 2020    ALLERGIES:  is allergic to celecoxib.  MEDICATIONS:  Current Outpatient Medications  Medication Sig Dispense Refill   acetaminophen (TYLENOL) 500 MG tablet 1 tablet as needed Orally every 6 hrs     benzonatate (TESSALON) 100 MG capsule Take 2 capsules (200 mg total) by mouth 3 (three) times daily as needed for cough. 20 capsule 2   diphenoxylate-atropine ([name]) 2.5-0.025 MG tablet Take 1 tablet by mouth 4 (four) times daily as needed for diarrhea or loose stools. 30 tablet 0   folic acid (FOLVITE) 1 MG tablet TAKE 1 TABLET(1 MG) BY MOUTH DAILY 90 tablet 3   HYDROcodone-acetaminophen (NORCO) 5-325 MG tablet Take 1 tablet by mouth every 4 (four) hours as needed for moderate  pain. 120 tablet 0   lidocaine (LIDODERM) 5 % Place 1 patch onto the skin daily. Remove & Discard patch within 12 hours or as directed by MD 30 patch 0   lidocaine-prilocaine (EMLA) cream Apply 1 Application topically as needed. 30 g 0   magic mouthwash (nystatin, diphenhydrAMINE, alum & mag hydroxide) suspension mixture Swish and swallow 10 mLs 3 (three) times daily. 240 mL 2   meloxicam (MOBIC) 15 MG tablet Take 1 tablet (15 mg total) by mouth daily. 30 tablet 0   nicotine (NICODERM CQ - DOSED IN MG/24 HOURS) 21 mg/24hr patch Place 1 patch (21 mg total) onto the skin daily. 28 patch 1   prochlorperazine (COMPAZINE) 10 MG tablet Take 1 tablet (10 mg total) by mouth every 6 (six) hours as needed for nausea or vomiting. 30 tablet 2   No current facility-administered medications for this visit.    VITAL SIGNS: There were no vitals taken for this visit. There were no vitals filed for this visit.  Estimated body mass index is 17.11 kg/m as calculated from the following:   Height as of 07/25/22: 5\' 6"  (1.676 m).   Weight as of 02/14/23: 106 lb (48.1 kg).   PERFORMANCE STATUS (ECOG) : 1 - Symptomatic but completely ambulatory  Physical Exam: General: NAD, resting comfortably in recliner Cardiovascular: regular rate and rhythm Pulmonary: regular, even, and unlabored Extremities: no edema, no joint deformities Neurological: alert, oriented x 4, mood appropriate   IMPRESSION: I saw Lauren Salazar during her infusion. No  acute distress. Denies nausea, vomiting, constipation, or diarrhea. Is taking things one day at a time. Appetite is fair.   Neoplasm related pain Lauren Salazar states she continues to have some pain however not as severe as it was previously. She has Norco available. Does not take around the clock. Occasionally at bedtime when needed. Does not take during the day as she does not like to feel drowsy. She is taking mobic daily and Tylenol as needed. Uses lidocaine patches. Feels this  manages her pain overall.   No adjustments at this time as requested by patient. We will continue to support and follow as needed. Patient knows to contact our office with any needs.   PLAN:  Norco one tablet every 4-6 hours as needed. Does not take daily Mobic daily. Miralax daily  Lidocaine patch  We will continue to closely monitor and assist with symptom management as needed. Palliative will plan to see patient back in 3-4 weeks in collaboration to other oncology appointments.    Patient expressed understanding and was in agreement with this plan. She also understands that She can call the clinic at any time with any questions, concerns, or complaints.   Any controlled substances utilized were prescribed in the context of palliative care. PDMP has been reviewed.    Visit consisted of counseling and education dealing with the complex and emotionally intense issues of symptom management and palliative care in the setting of serious and potentially life-threatening illness.Greater than 50%  of this time was spent counseling and coordinating care related to the above assessment and plan.   [name], AGPCNP-BC  Palliative Medicine Team/[facility]  *Please note that this is a verbal dictation therefore any spelling or grammatical errors are due to the "Dragon Medical One" system interpretation.

## 2023-03-07 ENCOUNTER — Inpatient Hospital Stay: Payer: Medicaid Other | Admitting: Nurse Practitioner

## 2023-03-07 ENCOUNTER — Other Ambulatory Visit: Payer: Self-pay | Admitting: Hematology and Oncology

## 2023-03-07 ENCOUNTER — Inpatient Hospital Stay: Payer: Medicaid Other

## 2023-03-07 ENCOUNTER — Telehealth: Payer: Self-pay | Admitting: *Deleted

## 2023-03-07 ENCOUNTER — Inpatient Hospital Stay: Payer: Medicaid Other | Attending: Oncology

## 2023-03-07 DIAGNOSIS — C3490 Malignant neoplasm of unspecified part of unspecified bronchus or lung: Secondary | ICD-10-CM

## 2023-03-07 NOTE — Progress Notes (Deleted)
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 Clelia Croft 05/26/2022: CT scan showed enlargement of RLL mass, right iliac crest mass with large soft tissue components extending in the iliacus and gluteus musculature. Additionally new pathologic right external iliac adenopathy.  06/07/2022: transition care to Dr. Leonides Schanz  07/12/2022: Cycle 1 Day 1 of docetaxel and ramucirumab 08/02/2022: Cycle 2 Day 1 of docetaxel and ramucirumab (dose reduced docetaxel from 75 mg/m2 to 60 mg/m2 due to poor tolerance) 08/23/2022: Cycle 3 Day 1 of docetaxel and ramucirumab 10/04/2022: Cycle 4 Day 1 of docetaxel and ramucirumab 11/01/2022: Cycle 5 Day 1 of docetaxel and ramucirumab 11/29/2022: Cycle 6 Day 1 of docetaxel and ramucirumab (dose reduced docetaxel from 60 mg/m2 to 50 mg/m2 due to persistent fatigue) 12/20/2022: Cycle 7 Day 1 of docetaxel and ramucirumab 01/31/2023: held Cycle 8 Day 1 of docetaxel and ramucirumab due to severe congestion/cough 02/14/2023 - Cycle 8 Day 1 administered.     The patient understands the plans discussed today and is in agreement with them.  She knows to contact our office if she develops concerns prior to her next appointment.  I provided *** minutes of face-to-face time during this encounter and > 50% was spent counseling as documented under my assessment and plan.    Carlean Jews, NP  [facility] - A DEPT OF [facility] [address] [place] [address] Dept: [phone] Dept Fax: [phone]   No orders of the defined types were placed in this encounter.     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   Current Treatment:  docetaxel and ramucirumab q 21 days   INTERVAL HISTORY:  Lauren Salazar is here today for repeat clinical assessment. She denies fevers or chills. She denies pain. Her appetite is good. Her weight {Weight change:10426}.  I have reviewed the past medical history, past surgical history, social history and family history with the patient and they are unchanged from previous note.  ALLERGIES:  is allergic to celecoxib.  MEDICATIONS:  Current Outpatient Medications  Medication Sig Dispense Refill   acetaminophen (TYLENOL) 500 MG tablet 1 tablet as needed Orally every 6 hrs     benzonatate (TESSALON) 100 MG capsule Take 2 capsules (200 mg total) by mouth 3 (three) times daily as needed for cough. 20 capsule 2   diphenoxylate-atropine ([name]) 2.5-0.025 MG tablet Take 1 tablet by mouth 4 (four) times daily as needed for diarrhea or loose stools. 30 tablet 0   folic acid (FOLVITE) 1 MG tablet TAKE 1 TABLET(1 MG) BY MOUTH DAILY 90 tablet 3   HYDROcodone-acetaminophen (NORCO) 5-325 MG tablet Take 1 tablet by mouth every 4 (four) hours as needed for moderate pain. 120 tablet 0   lidocaine (LIDODERM) 5 % Place 1 patch onto the skin daily. Remove & Discard patch within 12 hours or as directed by MD 30 patch 0   lidocaine-prilocaine (EMLA) cream Apply 1 Application topically as needed. 30 g 0   magic mouthwash (nystatin, diphenhydrAMINE, alum & mag hydroxide) suspension mixture Swish and swallow 10 mLs 3 (three) times daily. 240 mL 2   meloxicam (MOBIC) 15 MG tablet Take 1 tablet (15 mg total)  by mouth daily. 30 tablet 0   nicotine (NICODERM CQ - DOSED IN MG/24 HOURS) 21 mg/24hr patch Place 1 patch (21 mg total) onto the skin daily. 28 patch 1   prochlorperazine (COMPAZINE) 10 MG tablet Take 1 tablet (10 mg total) by mouth every 6 (six) hours as needed for nausea or vomiting. 30 tablet 2   No current facility-administered medications for this visit.    HISTORY OF PRESENT  ILLNESS:   Oncology History  Lung cancer (HCC)  03/14/2019 Initial Diagnosis   Lung cancer (HCC)   05/14/2019 - 06/04/2019 Chemotherapy   The patient had palonosetron (ALOXI) injection 0.25 mg, 0.25 mg, Intravenous,  Once, 4 of 4 cycles Administration: 0.25 mg (05/14/2019), 0.25 mg (05/21/2019), 0.25 mg (05/28/2019), 0.25 mg (06/04/2019) CARBOplatin (PARAPLATIN) 190 mg in sodium chloride 0.9 % 250 mL chemo infusion, 190 mg (102.4 % of original dose 186 mg), Intravenous,  Once, 4 of 4 cycles Dose modification:   (original dose 186 mg, Cycle 1) Administration: 190 mg (05/14/2019), 190 mg (05/21/2019), 200 mg (05/28/2019), 200 mg (06/04/2019) PACLitaxel (TAXOL) 78 mg in sodium chloride 0.9 % 250 mL chemo infusion (</= 80mg /m2), 45 mg/m2 = 78 mg, Intravenous,  Once, 4 of 4 cycles Administration: 78 mg (05/14/2019), 78 mg (05/21/2019), 78 mg (05/28/2019), 78 mg (06/04/2019)  for chemotherapy treatment.    09/03/2019 - 10/15/2019 Chemotherapy    Patient is on Treatment Plan: LUNG CARBOPLATIN / PEMETREXED / PEMBROLIZUMAB Q21D INDUCTION X 4 CYCLES / MAINTENANCE PEMETREXED + PEMBROLIZUMAB      07/22/2020 Cancer Staging   Staging form: Lung, AJCC 8th Edition - Clinical: Stage IIIB (cT4, cN2, cM0) - Signed by Benjiman Core, MD on 07/22/2020   08/04/2020 - 07/07/2021 Chemotherapy   Patient is on Treatment Plan : LUNG CARBOplatin / Pemetrexed / Pembrolizumab q21d Induction x 4 cycles / Maintenance Pemetrexed + Pembrolizumab     12/22/2020 - 03/28/2022 Chemotherapy   Patient is on Treatment Plan : ANUS Pembrolizumab (400) q42d     07/12/2022 -  Chemotherapy   Patient is on Treatment Plan : LUNG Docetaxel + Ramucirumab q21d      02/06/2023 Imaging   CT chest, abdomen, and pelvis with contrast  IMPRESSION: Overall progression of disease. Significant increase in bilateral adrenal masses. There also increase in size of a subcarinal lymph node, retroperitoneal nodes and pelvic sidewall nodes. The primary lesion in the  superior segment of the right lower lobe appears overall slightly larger although is more cavitary today.  Stable presumed bony lesion involving the right iliac bone, metastatic lesion. The heterogeneous appearance to the sacrum could be more insufficiency fracture rather than additional spread of disease. Overall a bone scan may be of some benefit to better define the extent and distribution of bony metastatic disease as clinically appropriate.   Large midline anterior abdominal wall hernia involving bowel with rectus muscle diastasis.   Surgical changes along several loops of bowel as well as what appears to be an ileal loop conduit with cystectomy.   Extensive atherosclerotic changes identified. Significant areas of disease are seen including what appears to be a significant stenosis of the left common carotid artery, occlusion of the left vertebral artery, occlusion of the right common iliac artery and other areas of significant iliac stenosis. Please correlate with specific symptoms.   Cancer of lower lobe of right lung (HCC)  04/02/2019 Initial Diagnosis   Cancer of lower lobe of right lung (HCC)   04/02/2019 Cancer Staging   Staging form:  Lung, AJCC 8th Edition - Clinical stage from 04/02/2019: Stage IIIB (cT4, cN2, cM0) - Signed by Lonie Peak, MD on 04/02/2019   02/06/2023 Imaging   CT chest, abdomen, and pelvis with contrast  IMPRESSION: Overall progression of disease. Significant increase in bilateral adrenal masses. There also increase in size of a subcarinal lymph node, retroperitoneal nodes and pelvic sidewall nodes. The primary lesion in the superior segment of the right lower lobe appears overall slightly larger although is more cavitary today.  Stable presumed bony lesion involving the right iliac bone, metastatic lesion. The heterogeneous appearance to the sacrum could be more insufficiency fracture rather than additional spread of disease. Overall a bone scan may be of some  benefit to better define the extent and distribution of bony metastatic disease as clinically appropriate.   Large midline anterior abdominal wall hernia involving bowel with rectus muscle diastasis.   Surgical changes along several loops of bowel as well as what appears to be an ileal loop conduit with cystectomy.   Extensive atherosclerotic changes identified. Significant areas of disease are seen including what appears to be a significant stenosis of the left common carotid artery, occlusion of the left vertebral artery, occlusion of the right common iliac artery and other areas of significant iliac stenosis. Please correlate with specific symptoms.       REVIEW OF SYSTEMS:   Constitutional: Denies fevers, chills or abnormal weight loss Eyes: Denies blurriness of vision Ears, nose, mouth, throat, and face: Denies mucositis or sore throat Respiratory: Denies cough, dyspnea or wheezes Cardiovascular: Denies palpitation, chest discomfort or lower extremity swelling Gastrointestinal:  Denies nausea, heartburn or change in bowel habits Skin: Denies abnormal skin rashes Lymphatics: Denies new lymphadenopathy or easy bruising Neurological:Denies numbness, tingling or new weaknesses Behavioral/Psych: Mood is stable, no new changes  All other systems were reviewed with the patient and are negative.   VITALS:  There were no vitals taken for this visit.  Wt Readings from Last 3 Encounters:  02/14/23 106 lb (48.1 kg)  01/31/23 108 lb 3.2 oz (49.1 kg)  12/20/22 110 lb 14.4 oz (50.3 kg)    There is no height or weight on file to calculate BMI.  Performance status (ECOG): {CHL ONC [id number]  PHYSICAL EXAM:   GENERAL:alert, no distress and comfortable SKIN: skin color, texture, turgor are normal, no rashes or significant lesions EYES: normal, Conjunctiva are pink and non-injected, sclera clear OROPHARYNX:no exudate, no erythema and lips, buccal mucosa, and tongue normal  NECK:  supple, thyroid normal size, non-tender, without nodularity LYMPH:  no palpable lymphadenopathy in the cervical, axillary or inguinal LUNGS: clear to auscultation and percussion with normal breathing effort HEART: regular rate & rhythm and no murmurs and no lower extremity edema ABDOMEN:abdomen soft, non-tender and normal bowel sounds Musculoskeletal:no cyanosis of digits and no clubbing  NEURO: alert & oriented x 3 with fluent speech, no focal motor/sensory deficits  LABORATORY DATA:  I have reviewed the data as listed    Component Value Date/Time   NA 140 02/14/2023 1102   K 3.8 02/14/2023 1102   CL 101 02/14/2023 1102   CO2 29 02/14/2023 1102   GLUCOSE 151 (H) 02/14/2023 1102   BUN 17 02/14/2023 1102   CREATININE 0.99 02/14/2023 1102   CALCIUM 9.3 02/14/2023 1102   PROT 7.0 02/14/2023 1102   ALBUMIN 3.3 (L) 02/14/2023 1102   AST 12 (L) 02/14/2023 1102   ALT 9 02/14/2023 1102   ALKPHOS 75 02/14/2023 1102   BILITOT 0.3 02/14/2023  1102   GFRNONAA >60 02/14/2023 1102   GFRAA >60 11/26/2019 1311    No results found for: "SPEP", "UPEP"  Lab Results  Component Value Date   WBC 5.9 02/14/2023   NEUTROABS 4.5 02/14/2023   HGB 11.0 (L) 02/14/2023   HCT 35.6 (L) 02/14/2023   MCV 92.2 02/14/2023   PLT 187 02/14/2023      Chemistry      Component Value Date/Time   NA 140 02/14/2023 1102   K 3.8 02/14/2023 1102   CL 101 02/14/2023 1102   CO2 29 02/14/2023 1102   BUN 17 02/14/2023 1102   CREATININE 0.99 02/14/2023 1102      Component Value Date/Time   CALCIUM 9.3 02/14/2023 1102   ALKPHOS 75 02/14/2023 1102   AST 12 (L) 02/14/2023 1102   ALT 9 02/14/2023 1102   BILITOT 0.3 02/14/2023 1102       RADIOGRAPHIC STUDIES: I have personally reviewed the radiological images as listed and agreed with the findings in the report. CT CHEST ABDOMEN PELVIS W CONTRAST  Result Date: 02/15/2023 CLINICAL DATA:  Assess treatment response. Non-small-cell lung cancer. * Tracking  Code: BO * EXAM: CT CHEST, ABDOMEN, AND PELVIS WITH CONTRAST TECHNIQUE: Multidetector CT imaging of the chest, abdomen and pelvis was performed following the standard protocol during bolus administration of intravenous contrast. RADIATION DOSE REDUCTION: This exam was performed according to the departmental dose-optimization program which includes automated exposure control, adjustment of the mA and/or kV according to patient size and/or use of iterative reconstruction technique. CONTRAST:  OMNIPAQUE IOHEXOL 300 MG/ML  SOLN COMPARISON:  11/18/2022 and older FINDINGS: CT CHEST FINDINGS Cardiovascular: Right upper chest port in place with tip of the catheter extending to the SVC right atrial junction region. Heart is nonenlarged. Trace pericardial fluid. Coronary artery calcifications are seen. The thoracic aorta has some scattered partially calcified atherosclerotic plaque. There is some plaque as well along the great vessels with what appears to be an occluded left vertebral artery. There is a mild stenosis of the origin of the right subclavian related to calcified plaque. There is fusiform plaque along the brachiocephalic artery and proximal right common carotid with only mild encroachment. However there is a potential high-grade stenosis of the proximal aspect of the left common carotid artery with a patent lumen is narrow is 2.7 mm. Please correlate with symptoms and further workup when appropriate. Mediastinum/Nodes: Normal caliber thoracic esophagus. Slightly heterogeneous thyroid gland. No specific abnormal lymph node enlargement identified in the axillary regions or hilum. There are some small mediastinal nodes. However 1 particular node in the subcarinal region is enlarged today measuring 20 by 11 mm and previously 14 x 8 mm, increasing. Lungs/Pleura: Centrilobular emphysematous changes are again seen. Left lung is without consolidation, pneumothorax or effusion. Once again the right lung has a tiny  right dependent pleural effusion. There is masslike opacity seen in the superior segment of the right lower lobe extending to the hilum with some new cavitary areas. Overall the lesion itself on the prior measured 6.0 by 4.1 cm and today when measured in a similar fashion for continuity measures 6.6 by 4.3 cm, slightly larger. There is more central bronchial wall thickening in this location with the occlusion of some bronchi. Stable bandlike areas of opacity in the right upper lobe and middle lobe. Apical pleural thickening. Musculoskeletal: Osteopenia and degenerative changes along the spine. Fixation hardware along the lower cervical spine at the edge of the imaging field. CT ABDOMEN PELVIS FINDINGS  Hepatobiliary: No focal liver abnormality is seen. No gallstones, gallbladder wall thickening, or biliary dilatation. Pancreas: Unremarkable. No pancreatic ductal dilatation or surrounding inflammatory changes. Spleen: Normal in size without focal abnormality. Adrenals/Urinary Tract: Bilateral adrenal masses are identified. These are increased from previous. The right-sided focus previously measured 16 x 13 mm and today a much more solid lesion now measures 3.4 by 2.3 cm. Left-sided focus measured 18 x 15 mm and today focus measures 2.8 by 1.4 cm on series 2, image 67. Additional area inferiorly along the left adrenal gland is seen as well. Bilateral renal atrophy, right worse than left. No enhancing mass or collecting system dilatation. Bladder is surgically absent. Right-sided anterior abdominal wall ileal loop conduit identified. Stomach/Bowel: Oral contrast was administered. There is moderate debris in the stomach. The stomach and small bowel are nondilated. Large bowel has a normal course and caliber as well with scattered stool few left-sided colonic diverticula. Rectus muscle diastasis with a large anterior abdominal wall hernia involving transverse colon and fat. Surgical changes seen along the loop of bowel.  Vascular/Lymphatic: Extensive vascular calcifications are seen. There is either high-grade stenosis or focal occlusion of the right common iliac artery. Significant stenosis on the left as well. Normal caliber IVC. Several abnormal nodes identified in the retroperitoneum which are increased. Example left para-node today measures only 10 by 8 mm but previously measuring under 5 mm. Several external iliac chain nodes identified on the right side example series 2, image 109 measures 14 by 9 mm. Previously 11 by 7 mm. Reproductive: Status post hysterectomy. No adnexal masses. Other: Anasarca.  No free air or free fluid. Musculoskeletal: Destructive sclerotic and lucent lesion along the right iliac bone is again noted. Extension along the adjacent musculature. Areas of heterogeneity as well along the sacrum. IMPRESSION: Overall progression of disease. Significant increase in bilateral adrenal masses. There also increase in size of a subcarinal lymph node, retroperitoneal nodes and pelvic sidewall nodes. The primary lesion in the superior segment of the right lower lobe appears overall slightly larger although is more cavitary today. Stable presumed bony lesion involving the right iliac bone, metastatic lesion. The heterogeneous appearance to the sacrum could be more insufficiency fracture rather than additional spread of disease. Overall a bone scan may be of some benefit to better define the extent and distribution of bony metastatic disease as clinically appropriate. Large midline anterior abdominal wall hernia involving bowel with rectus muscle diastasis. Surgical changes along several loops of bowel as well as what appears to be an ileal loop conduit with cystectomy. Extensive atherosclerotic changes identified. Significant areas of disease are seen including what appears to be a significant stenosis of the left common carotid artery, occlusion of the left vertebral artery, occlusion of the right common iliac artery  and other areas of significant iliac stenosis. Please correlate with specific symptoms. Electronically Signed   By: Karen Kays M.D.   On: 02/15/2023 14:05

## 2023-03-07 NOTE — Telephone Encounter (Signed)
She did not come to any of her appts today. She said she's had a cough and congestion for the past 2 months. It gets better and then worse. Her PCP gave her 2 rounds of different antibiotics - second one was stronger, finished around mid October. She said PCP tested for Flu and Covid at that time - negative. She said she felt better for about a week, but it's worse in the last month - coughs all night long. Cough is productive, mucus is clear. Gets short of breath with moving around the house. Stated she had not been in touch with PCP since completing second rx of antibiotics in mid October. Advised her to contact PCP. She said she would call them tomorrow.

## 2023-03-07 NOTE — Assessment & Plan Note (Deleted)
#   Metastatic Adenosquamous Lung Cancer  05/10/2022: last visit with Dr. Clelia Croft 05/26/2022: CT scan showed enlargement of RLL mass, right iliac crest mass with large soft tissue components extending in the iliacus and gluteus musculature. Additionally new pathologic right external iliac adenopathy.  06/07/2022: transition care to Dr. Leonides Schanz  07/12/2022: Cycle 1 Day 1 of docetaxel and ramucirumab 08/02/2022: Cycle 2 Day 1 of docetaxel and ramucirumab (dose reduced docetaxel from 75 mg/m2 to 60 mg/m2 due to poor tolerance) 08/23/2022: Cycle 3 Day 1 of docetaxel and ramucirumab 10/04/2022: Cycle 4 Day 1 of docetaxel and ramucirumab 11/01/2022: Cycle 5 Day 1 of docetaxel and ramucirumab 11/29/2022: Cycle 6 Day 1 of docetaxel and ramucirumab (dose reduced docetaxel from 60 mg/m2 to 50 mg/m2 due to persistent fatigue) 12/20/2022: Cycle 7 Day 1 of docetaxel and ramucirumab 01/31/2023: held Cycle 8 Day 1 of docetaxel and ramucirumab due to severe congestion/cough 02/14/2023 - Cycle 8 Day 1 administered.

## 2023-03-09 ENCOUNTER — Inpatient Hospital Stay: Payer: Medicaid Other

## 2023-03-17 ENCOUNTER — Encounter: Payer: Self-pay | Admitting: Hematology and Oncology

## 2023-03-17 ENCOUNTER — Encounter: Payer: Self-pay | Admitting: Physician Assistant

## 2023-03-21 ENCOUNTER — Telehealth: Payer: Self-pay | Admitting: Hematology and Oncology

## 2023-03-21 NOTE — Telephone Encounter (Signed)
Telephone call to check on patient after scheduling called to confirm appts and discovered patient was still very ill.   Patient is still suffering with this "cold and cough" like symptoms she had in October. She can't catch her breath. She speaks 5-8 words and then has to catch her breath. She coughs so much she throws up. No fevers. Her chest feels tight and hurts "like she has been punching a bag". She is unable to take a deep breath. She has not been able to get out of bed and wants to cancel her appts next week again. She sounds so breathless on the phone.   This RN advised patient to go the nearest ED for evaluation. Patient declined stating she would lay there and tough it out through the holiday. Again RN suggested her PCP which has open walk in after hours and she further declined stating the Xray done last month showed this was not pneumonia. This RN tried to educate patient that this illness could progress and change from a month ago. Patient refused to seek medical  attention.  Patient then states she would wait until next week and keep her appt with Dr Leonides Schanz. If needed she would call her PCP and get a zpak again if needed.

## 2023-03-22 ENCOUNTER — Telehealth: Payer: Self-pay | Admitting: *Deleted

## 2023-03-22 NOTE — Telephone Encounter (Signed)
TCT patient regarding her appts for next week.  Spoke with her. She states she feels a little bit better today though she is still coughing quite a bit. Advised that she needs to reach out to her PCP to them her/him know that she is still sick after a round of antibiotics.  Pt states she felt better after a steroid dospak. She states she will call her PCP. Advised that we need to see her next week as scheduled-at least get her labs and see Dr. Leonides Schanz. We can determine at that point whether or not she should get her treatment. She is agreeable to coming in next week.  Dr. Leonides Schanz made aware.

## 2023-03-24 ENCOUNTER — Encounter: Payer: Self-pay | Admitting: Physician Assistant

## 2023-03-24 ENCOUNTER — Encounter: Payer: Self-pay | Admitting: Hematology and Oncology

## 2023-03-24 NOTE — Telephone Encounter (Signed)
See previous note

## 2023-03-28 ENCOUNTER — Inpatient Hospital Stay: Payer: Medicaid Other

## 2023-03-28 ENCOUNTER — Inpatient Hospital Stay: Payer: Medicaid Other | Admitting: Hematology and Oncology

## 2023-03-28 NOTE — Progress Notes (Unsigned)
Patient cancelled

## 2023-03-29 ENCOUNTER — Encounter: Payer: Self-pay | Admitting: Physician Assistant

## 2023-03-29 ENCOUNTER — Encounter: Payer: Self-pay | Admitting: Hematology and Oncology

## 2023-03-30 ENCOUNTER — Inpatient Hospital Stay: Payer: Medicaid Other

## 2023-04-12 ENCOUNTER — Telehealth: Payer: Self-pay | Admitting: Hematology and Oncology

## 2023-04-24 ENCOUNTER — Telehealth: Payer: Self-pay | Admitting: Hematology and Oncology

## 2023-04-25 ENCOUNTER — Inpatient Hospital Stay: Payer: Medicaid Other | Attending: Oncology

## 2023-04-25 ENCOUNTER — Inpatient Hospital Stay (HOSPITAL_BASED_OUTPATIENT_CLINIC_OR_DEPARTMENT_OTHER): Payer: Medicaid Other | Admitting: Hematology and Oncology

## 2023-04-25 VITALS — BP 135/90 | HR 118 | Temp 98.0°F | Resp 18 | Wt 105.0 lb

## 2023-04-25 DIAGNOSIS — M79604 Pain in right leg: Secondary | ICD-10-CM

## 2023-04-25 DIAGNOSIS — C3490 Malignant neoplasm of unspecified part of unspecified bronchus or lung: Secondary | ICD-10-CM

## 2023-04-25 DIAGNOSIS — R110 Nausea: Secondary | ICD-10-CM | POA: Diagnosis not present

## 2023-04-25 DIAGNOSIS — Z95828 Presence of other vascular implants and grafts: Secondary | ICD-10-CM | POA: Diagnosis not present

## 2023-04-25 DIAGNOSIS — C3431 Malignant neoplasm of lower lobe, right bronchus or lung: Secondary | ICD-10-CM | POA: Diagnosis present

## 2023-04-25 DIAGNOSIS — C7951 Secondary malignant neoplasm of bone: Secondary | ICD-10-CM | POA: Insufficient documentation

## 2023-04-25 DIAGNOSIS — Z79899 Other long term (current) drug therapy: Secondary | ICD-10-CM | POA: Insufficient documentation

## 2023-04-25 DIAGNOSIS — R197 Diarrhea, unspecified: Secondary | ICD-10-CM | POA: Insufficient documentation

## 2023-04-25 DIAGNOSIS — G893 Neoplasm related pain (acute) (chronic): Secondary | ICD-10-CM | POA: Diagnosis not present

## 2023-04-25 DIAGNOSIS — F17210 Nicotine dependence, cigarettes, uncomplicated: Secondary | ICD-10-CM | POA: Diagnosis not present

## 2023-04-25 LAB — CMP (CANCER CENTER ONLY)
ALT: 7 U/L (ref 0–44)
AST: 10 U/L — ABNORMAL LOW (ref 15–41)
Albumin: 3 g/dL — ABNORMAL LOW (ref 3.5–5.0)
Alkaline Phosphatase: 95 U/L (ref 38–126)
Anion gap: 8 (ref 5–15)
BUN: 17 mg/dL (ref 8–23)
CO2: 29 mmol/L (ref 22–32)
Calcium: 9 mg/dL (ref 8.9–10.3)
Chloride: 98 mmol/L (ref 98–111)
Creatinine: 0.7 mg/dL (ref 0.44–1.00)
GFR, Estimated: >60 mL/min (ref >60)
Glucose, Bld: 111 mg/dL — ABNORMAL HIGH (ref 70–99)
Potassium: 4.3 mmol/L (ref 3.5–5.1)
Sodium: 135 mmol/L (ref 135–145)
Total Bilirubin: 0.3 mg/dL (ref 0.0–1.2)
Total Protein: 7.3 g/dL (ref 6.5–8.1)

## 2023-04-25 LAB — CBC WITH DIFFERENTIAL (CANCER CENTER ONLY)
Abs Immature Granulocytes: 0.04 10*3/uL (ref 0.00–0.07)
Basophils Absolute: 0 10*3/uL (ref 0.0–0.1)
Basophils Relative: 0 %
Eosinophils Absolute: 0.1 10*3/uL (ref 0.0–0.5)
Eosinophils Relative: 1 %
HCT: 32.4 % — ABNORMAL LOW (ref 36.0–46.0)
Hemoglobin: 10 g/dL — ABNORMAL LOW (ref 12.0–15.0)
Immature Granulocytes: 1 %
Lymphocytes Relative: 6 %
Lymphs Abs: 0.6 10*3/uL — ABNORMAL LOW (ref 0.7–4.0)
MCH: 25.3 pg — ABNORMAL LOW (ref 26.0–34.0)
MCHC: 30.9 g/dL (ref 30.0–36.0)
MCV: 81.8 fL (ref 80.0–100.0)
Monocytes Absolute: 0.9 10*3/uL (ref 0.1–1.0)
Monocytes Relative: 11 %
Neutro Abs: 7.2 10*3/uL (ref 1.7–7.7)
Neutrophils Relative %: 81 %
Platelet Count: 414 10*3/uL — ABNORMAL HIGH (ref 150–400)
RBC: 3.96 MIL/uL (ref 3.87–5.11)
RDW: 19.3 % — ABNORMAL HIGH (ref 11.5–15.5)
WBC Count: 8.9 10*3/uL (ref 4.0–10.5)
nRBC: 0 % (ref 0.0–0.2)

## 2023-04-25 LAB — TSH: TSH: 3.227 u[IU]/mL (ref 0.350–4.500)

## 2023-04-25 LAB — TOTAL PROTEIN, URINE DIPSTICK: Protein, ur: 30 mg/dL — AB

## 2023-04-25 MED ORDER — BENZONATATE 100 MG PO CAPS: 200.0000 mg | ORAL_CAPSULE | Freq: Three times a day (TID) | ORAL | 2 refills | Status: DC | PRN

## 2023-04-25 MED ORDER — SODIUM CHLORIDE 0.9% FLUSH
10.0000 mL | Freq: Once | INTRAVENOUS | Status: AC
  Administered 2023-04-25: 10 mL

## 2023-04-25 MED ORDER — HEPARIN SOD (PORK) LOCK FLUSH 100 UNIT/ML IV SOLN
500.0000 [IU] | Freq: Once | INTRAVENOUS | Status: AC
  Administered 2023-04-25: 500 [IU]

## 2023-04-25 NOTE — Progress Notes (Signed)
 [facility] [phone]   [phone]  PROGRESS NOTE  Patient Care Team: [facility] [place] as PCP - General (Family Medicine)  Hematological/Oncological History # Metastatic Adenosquamous Lung Cancer  05/10/2022: last visit with Dr. Amadeo 05/26/2022: CT scan showed enlargement of RLL mass, right iliac crest mass with large soft tissue components extending in the iliacus and gluteus musculature. Additionally new pathologic right external iliac adenopathy.  06/07/2022: transition care to Dr. Federico  07/12/2022: Cycle 1 Day 1 of docetaxel  and ramucirumab  08/02/2022: Cycle 2 Day 1 of docetaxel  and ramucirumab  (dose reduced docetaxel  from 75 mg/m2 to 60 mg/m2 due to poor tolerance) 08/23/2022: Cycle 3 Day 1 of docetaxel  and ramucirumab  10/04/2022: Cycle 4 Day 1 of docetaxel  and ramucirumab  11/01/2022: Cycle 5 Day 1 of docetaxel  and ramucirumab  11/29/2022: Cycle 6 Day 1 of docetaxel  and ramucirumab  (dose reduced docetaxel  from 60 mg/m2 to 50 mg/m2 due to persistent fatigue) 12/20/2022: Cycle 7 Day 1 of docetaxel  and ramucirumab  01/31/2023: held Cycle 8 Day 1 of docetaxel  and ramucirumab  due to severe congestion/cough  Interval History:  Lauren Salazar 62 y.o. female with medical history significant for metastatic adenosquamous lung cancer presents for a follow up visit. The patient's last visit was on 02/14/2023. She presents today for a follow up.  Lauren Salazar reports she has been continuing to cough up phlegm and some occasional blood.  She reports the phlegm is now more clear and white and she feels like the pneumonia is resolving.  She reports that she feels like she is quite tired and hurting.  She is having pain bilaterally in both hips.  She notes that she is continuing to take her Norco with moderate pain relief.  She is also been taking Tessalon  Perles which have helped some with her cough.  She notes that otherwise her appetite has  been quite poor and she has been functioning poorly.  She notes that she has not had any falls in the interim since her last visit.  She feels quite weak but is able to complete her day-to-day tasks..  She denies easy bruising or signs of active bleeding.  She has no other complaints. Rest of the 10 point ROS was otherwise negative.  MEDICAL HISTORY:  Past Medical History:  Diagnosis Date   Bladder tumor    Cancer (HCC)    Complication of anesthesia    slow to wake after spinal fusion, then was looney for the following 24 hours    Persistent dry cough    ongoing since feb 2020    SURGICAL HISTORY: Past Surgical History:  Procedure Laterality Date   CERVICAL FUSION  2005   c5-c6      CYSTOSCOPY WITH INJECTION N/A 02/01/2019   Procedure: CYSTOSCOPY WITH INJECTION;  Surgeon: Alvaro Hummer, MD;  Location: [facility];  Service: Urology;  Laterality: N/A;   IR IMAGING GUIDED PORT INSERTION  07/25/2022   LYMPHADENECTOMY Bilateral 02/01/2019   Procedure: LYMPHADENECTOMY;  Surgeon: Alvaro Hummer, MD;  Location: [facility];  Service: Urology;  Laterality: Bilateral;   ROBOT ASSISTED LAPAROSCOPIC COMPLETE CYSTECT ILEAL CONDUIT N/A 02/01/2019   Procedure: XI ROBOTIC ASSISTED LAPAROSCOPIC COMPLETE CYSTECTOMY, ILEAL CONDUIT, HYSTERECTOMY WITH BILATERAL SALPINGOOPHORECTOMY;  Surgeon: Alvaro Hummer, MD;  Location: [facility];  Service: Urology;  Laterality: N/A;  6 HRS   TRANSURETHRAL RESECTION OF BLADDER TUMOR N/A 10/31/2018   Procedure: TRANSURETHRAL RESECTION OF BLADDER TUMOR (TURBT) WITH CYSTOSCOPY/ POSSIBLE INTRAVESICAL GEMCITABINE ;  Surgeon: Devere Lonni Righter, MD;  Location:  [facility];  Service: Urology;  Laterality: N/A;    SOCIAL HISTORY: Social History   Socioeconomic History   Marital status: Divorced    Spouse name: Not on file   Number of children: Not on file   Years of education: Not on file   Highest education level: Not on file  Occupational History   Not on file  Tobacco Use    Smoking status: Every Day    Current packs/day: 1.00    Types: Cigarettes   Smokeless tobacco: Never   Tobacco comments:    smoking since age 66 , she is cutting back, using patches, trying to stop. 04/02/19  Vaping Use   Vaping status: Never Used  Substance and Sexual Activity   Alcohol use: No   Drug use: No   Sexual activity: Not Currently  Other Topics Concern   Not on file  Social History Narrative   Not on file   Social Drivers of Health   Financial Resource Strain: Not on file  Food Insecurity: Not on file  Transportation Needs: No Transportation Needs (04/02/2019)   PRAPARE - Administrator, Civil Service (Medical): No    Lack of Transportation (Non-Medical): No  Physical Activity: Not on file  Stress: Not on file  Social Connections: Not on file  Intimate Partner Violence: Not At Risk (04/02/2019)   Humiliation, Afraid, Rape, and Kick questionnaire    Fear of Current or Ex-Partner: No    Emotionally Abused: No    Physically Abused: No    Sexually Abused: No    FAMILY HISTORY: No family history on file.  ALLERGIES:  is allergic to celecoxib .  MEDICATIONS:  Current Outpatient Medications  Medication Sig Dispense Refill   Budeson-Glycopyrrol-Formoterol  (BREZTRI AEROSPHERE) 160-9-4.8 MCG/ACT AERO 2 puffs Inhalation Twice a day for 14 days     acetaminophen  (TYLENOL ) 500 MG tablet 1 tablet as needed Orally every 6 hrs     benzonatate  (TESSALON ) 100 MG capsule Take 2 capsules (200 mg total) by mouth 3 (three) times daily as needed for cough. 60 capsule 2   diphenoxylate -atropine  ([name] ) 2.5-0.025 MG tablet Take 1 tablet by mouth 4 (four) times daily as needed for diarrhea or loose stools. 30 tablet 0   folic acid  (FOLVITE ) 1 MG tablet TAKE 1 TABLET(1 MG) BY MOUTH DAILY 90 tablet 3   HYDROcodone -acetaminophen  (NORCO) 5-325 MG tablet Take 1 tablet by mouth every 4 (four) hours as needed for moderate pain. 120 tablet 0   lidocaine  (LIDODERM ) 5 % Place 1  patch onto the skin daily. Remove & Discard patch within 12 hours or as directed by MD 30 patch 0   lidocaine -prilocaine  (EMLA ) cream Apply 1 Application topically as needed. 30 g 0   magic mouthwash (nystatin , diphenhydrAMINE , alum & mag hydroxide) suspension mixture Swish and swallow 10 mLs 3 (three) times daily. 240 mL 2   meloxicam  (MOBIC ) 15 MG tablet Take 1 tablet (15 mg total) by mouth daily. 30 tablet 0   nicotine  (NICODERM CQ  - DOSED IN MG/24 HOURS) 21 mg/24hr patch Place 1 patch (21 mg total) onto the skin daily. 28 patch 1   prochlorperazine  (COMPAZINE ) 10 MG tablet Take 1 tablet (10 mg total) by mouth every 6 (six) hours as needed for nausea or vomiting. 30 tablet 2   No current facility-administered medications for this visit.    REVIEW OF SYSTEMS:   Constitutional: ( - ) fevers, ( - )  chills , ( - ) night sweats Eyes: ( - )  blurriness of vision, ( - ) double vision, ( - ) watery eyes Ears, nose, mouth, throat, and face: ( - ) mucositis, ( - ) sore throat Respiratory: (+ ) cough, ( - ) dyspnea, ( - ) wheezes Cardiovascular: ( - ) palpitation, ( - ) chest discomfort, ( - ) lower extremity swelling Gastrointestinal:  ( + ) nausea, ( - ) heartburn, ( -) change in bowel habits Skin: ( - ) abnormal skin rashes Lymphatics: ( - ) new lymphadenopathy, ( - ) easy bruising Neurological: ( - ) numbness, ( - ) tingling, ( - ) new weaknesses Behavioral/Psych: ( - ) mood change, ( - ) new changes  All other systems were reviewed with the patient and are negative.  PHYSICAL EXAMINATION: ECOG PERFORMANCE STATUS: 1 - Symptomatic but completely ambulatory  Vitals:   04/25/23 1256  BP: (!) 135/90  Pulse: (!) 118  Resp: 18  Temp: 98 F (36.7 C)  SpO2: 97%      Repeat HR is 105 bpm.   Filed Weights   04/25/23 1256  Weight: 105 lb (47.6 kg)       GENERAL: Chronically ill-appearing middle-aged Caucasian female, alert, no distress and comfortable SKIN: skin color, texture,  turgor are normal, no rashes or significant lesions EYES: conjunctiva are pink and non-injected, sclera clear LUNGS: clear to auscultation and percussion with normal breathing effort HEART: regular rate & rhythm and no murmurs and no lower extremity edema Musculoskeletal: no cyanosis of digits and no clubbing  PSYCH: alert & oriented x 3, fluent speech NEURO: no focal motor/sensory deficits  LABORATORY DATA:  I have reviewed the data as listed    Latest Ref Rng & Units 04/25/2023   12:18 PM 02/14/2023   11:02 AM 01/31/2023   12:14 PM  CBC  WBC 4.0 - 10.5 K/uL 8.9  5.9  8.3   Hemoglobin 12.0 - 15.0 g/dL 89.9  88.9  88.3   Hematocrit 36.0 - 46.0 % 32.4  35.6  36.8   Platelets 150 - 400 K/uL 414  187  191        Latest Ref Rng & Units 04/25/2023   12:18 PM 02/14/2023   11:02 AM 01/31/2023   12:14 PM  CMP  Glucose 70 - 99 mg/dL 888  848  896   BUN 8 - 23 mg/dL 17  17  18    Creatinine 0.44 - 1.00 mg/dL 9.29  9.00  9.14   Sodium 135 - 145 mmol/L 135  140  136   Potassium 3.5 - 5.1 mmol/L 4.3  3.8  4.3   Chloride 98 - 111 mmol/L 98  101  101   CO2 22 - 32 mmol/L 29  29  26    Calcium 8.9 - 10.3 mg/dL 9.0  9.3  9.4   Total Protein 6.5 - 8.1 g/dL 7.3  7.0  7.0   Total Bilirubin 0.0 - 1.2 mg/dL 0.3  0.3  0.3   Alkaline Phos 38 - 126 U/L 95  75  72   AST 15 - 41 U/L 10  12  11    ALT 0 - 44 U/L 7  9  7      RADIOGRAPHIC STUDIES: No results found.  ASSESSMENT & PLAN ZAMARIA BRAZZLE is a 62 y.o. female with medical history significant for metastatic adenosquamous lung cancer presents for a follow up visit.  She is status post robotic assisted laparoscopic cystectomy and pelvic exoneration including hysterectomy, bilateral salpingo-oophorectomy and ileoconduit diversion completed on February 01, 2019.  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   Radiation therapy with weekly chemotherapy utilizing carboplatin  and paclitaxel .  Week 1 of  chemotherapy started on May 14, 2019.  Therapy concluded in February 2021.   Durvalumab  10 mg/kg cycle 1 on Sep 03, 2019 every 14 days.  She completed 4 cycles of therapy.   Carboplatin  with Alimta  and Pembrolizumab  started on August 04, 2020.  She completed 3 cycles of therapy in June 2022.   Pembrolizumab  400 mg every 6 weeks started on December 22, 2020.  Therapy discontinued and March 2023 due to patient's preference.  Progression noted on CT scan from 05/26/2022.   Completed palliative radiation to her R hip/pelvis on 07/01/2022. Received 30 Gy in 10 Fx.   Started Docetaxel  and Ramucirumab  on 07/12/2022.  # Metastatic Adenosquamous Carcinoma of the Lung  --patient has undergone numerous lines of treatment including chemoradiation followed by immunotherapy, subsequent carbo/pem/pem and then monotherapy pembrolizumab  --progression of disease noted on 05/26/2022 CT scan. --Started docetaxel  plus ramucirumab  on 07/12/2022 --Due to poor tolerance with Cycle 1, dose reduced Doxcetaxel from 75 mg/m2 to 60 mg/m2 starting Cycle 2.  --Due to persistent fatigue, dose reduced docetaxel  from 60 mg/m2 to 50 mg/m2 starting Cycle 6 on 11/29/2022.  --Most CT scan from 11/18/2022 continued treatment response with stable RLL lung mass, decrease in size of right adrenal nodule, left retroperitoneal lymph node and sclerosis of bone metastases. No evidence of new or progressive disease.  Plan: --previously on Docetaxel /Ramucircumab, currently on hold due to worsening deconditioning.  --Today we had a long and detailed discussion about her fitness for chemotherapy.  Unfortunately I am concerned that chemotherapy would cause her more harm than good in her current state.  She is badly weakened and deconditioned and eating poorly.  Her blood counts are dropping and I am concerned that she is too frail for chemotherapy.  I was frank with her that I believe that chemotherapy would more likely shorten her life and extended her  life span.  She voiced understanding. -- labs today show white blood cell 8.9, hemoglobin 10.0, MCV 81.8, platelets 414.  Creatinine and LFTs normal.  --last CT scan on 02/06/2023 showed progression of disease with increased adrenal masses.  Will plan for repeat CT scan in January 2025. --RTC in 4 weeks to reassess to see if she is becoming a better candidate for chemotherapy.  # Abdominal Pain/ Cancer Related Pain --Prescribed oxycodone  5 mg as needed, however patient avoids taking this as it causes her to be loopy. --Patient completed palliative radiation with radiation oncology to right hip/pelvis with improvement of pain --Patient is c/o right sided mid back pain/hip pain. Currently take Norco 5-325 mg nightly to help her sleep but pain relief is short term.  --continue long acting pain medication with Xtampza  13.5 mg q 12 hours.   #Nausea: --Added IV emend to her pre-meds --Continue to take zofran  and compazine  as needed.   #Diarrhea: --Secondary to chemotherapy --Continue to take imodium  as needed.   #Joint pain 2/2 GCSF injection: --Recommend to take Claritin for several days with injection to minimize side effects.    No orders of the defined types were placed in this encounter.   All questions were answered. The patient knows to call the clinic with any problems, questions or concerns.  A total of more than 30 minutes were spent on this encounter with face-to-face time and non-face-to-face time, including preparing to see the patient,  ordering tests and/or medications, counseling the patient and coordination of care as outlined above.   Norleen IVAR Kidney, MD Department of Hematology/Oncology [facility] Phone: [phone] Pager: [phone] Email: [email or web address]   04/26/2023 3:29 PM

## 2023-04-26 ENCOUNTER — Encounter: Payer: Self-pay | Admitting: Physician Assistant

## 2023-04-26 ENCOUNTER — Encounter: Payer: Self-pay | Admitting: Hematology and Oncology

## 2023-04-26 LAB — T4: T4, Total: 5.6 ug/dL (ref 4.5–12.0)

## 2023-04-27 ENCOUNTER — Telehealth: Payer: Self-pay | Admitting: Hematology and Oncology

## 2023-05-01 ENCOUNTER — Telehealth: Payer: Self-pay | Admitting: *Deleted

## 2023-05-01 ENCOUNTER — Ambulatory Visit (HOSPITAL_COMMUNITY)
Admission: RE | Admit: 2023-05-01 | Discharge: 2023-05-01 | Disposition: A | Discharge status: Home / Self Care | Payer: Medicaid Other | Source: Ambulatory Visit | Attending: Hematology and Oncology | Admitting: Hematology and Oncology

## 2023-05-01 DIAGNOSIS — M79604 Pain in right leg: Secondary | ICD-10-CM | POA: Diagnosis present

## 2023-05-01 NOTE — Telephone Encounter (Signed)
-----   Message from Norleen ONEIDA Kidney IV sent at 05/01/2023  4:47 PM EST ----- Please let Ms. Glotfelty know that her ultrasound did not show any evidence of a lower extremity DVT. ----- Message ----- From: Interface, [employer] Sent: 05/01/2023   2:17 PM EST To: John T Dorsey IV, MD

## 2023-05-01 NOTE — Telephone Encounter (Signed)
 TCT patient regarding recent LE U/S. Spoke with her. Advised that t her ultrasound did not show any evidence of a lower extremity DVT. She is pleased about that.  She did say her hip is getting painful again and is concerned that the tumor is growing. She would like to resume chemo sooner rather tahn later. Advised that Dr. Federico does want a CT scan 1st and then he will have her come back to discuss those results and chemo. Pt states she does not want to wait until the middle of February for this conversation. Advised that I will let Dr. Federico know of her concerns and that she wants to resume chemo soon.

## 2023-05-17 ENCOUNTER — Other Ambulatory Visit: Payer: Self-pay | Admitting: Hematology and Oncology

## 2023-05-17 DIAGNOSIS — C3490 Malignant neoplasm of unspecified part of unspecified bronchus or lung: Secondary | ICD-10-CM

## 2023-05-25 ENCOUNTER — Ambulatory Visit (HOSPITAL_COMMUNITY)
Admission: RE | Admit: 2023-05-25 | Discharge: 2023-05-25 | Disposition: A | Discharge status: Home / Self Care | Payer: Medicaid Other | Source: Ambulatory Visit | Attending: Hematology and Oncology | Admitting: Hematology and Oncology

## 2023-05-25 ENCOUNTER — Ambulatory Visit (HOSPITAL_COMMUNITY): Payer: Medicaid Other

## 2023-05-25 DIAGNOSIS — C3490 Malignant neoplasm of unspecified part of unspecified bronchus or lung: Secondary | ICD-10-CM | POA: Insufficient documentation

## 2023-05-25 MED ORDER — IOHEXOL 300 MG/ML  SOLN
100.0000 mL | Freq: Once | INTRAMUSCULAR | Status: AC | PRN
  Administered 2023-05-25: 100 mL via INTRAVENOUS

## 2023-05-31 ENCOUNTER — Telehealth: Payer: Self-pay | Admitting: *Deleted

## 2023-05-31 NOTE — Telephone Encounter (Signed)
 Received call from pt requesting refill of her Hydrocodone  and oxycodone . Advised that I did not see any oxycodone  on her medication list. She said she just started using this recently because her pain in her hips has gotten worse, Advised that I would let Dr. Federico know of her pain med needs.  Please advise

## 2023-06-06 ENCOUNTER — Inpatient Hospital Stay: Payer: Medicaid Other

## 2023-06-06 ENCOUNTER — Inpatient Hospital Stay: Payer: Medicaid Other | Attending: Oncology

## 2023-06-06 ENCOUNTER — Inpatient Hospital Stay: Payer: Medicaid Other | Attending: Oncology | Admitting: Physician Assistant

## 2023-06-06 VITALS — BP 125/89 | HR 129 | Temp 98.1°F | Resp 18 | Wt 99.6 lb

## 2023-06-06 DIAGNOSIS — Z7951 Long term (current) use of inhaled steroids: Secondary | ICD-10-CM | POA: Diagnosis not present

## 2023-06-06 DIAGNOSIS — C3431 Malignant neoplasm of lower lobe, right bronchus or lung: Secondary | ICD-10-CM | POA: Insufficient documentation

## 2023-06-06 DIAGNOSIS — Z791 Long term (current) use of non-steroidal anti-inflammatories (NSAID): Secondary | ICD-10-CM | POA: Diagnosis not present

## 2023-06-06 DIAGNOSIS — J439 Emphysema, unspecified: Secondary | ICD-10-CM | POA: Insufficient documentation

## 2023-06-06 DIAGNOSIS — C7971 Secondary malignant neoplasm of right adrenal gland: Secondary | ICD-10-CM | POA: Insufficient documentation

## 2023-06-06 DIAGNOSIS — G893 Neoplasm related pain (acute) (chronic): Secondary | ICD-10-CM

## 2023-06-06 DIAGNOSIS — M25551 Pain in right hip: Secondary | ICD-10-CM | POA: Diagnosis not present

## 2023-06-06 DIAGNOSIS — I700 Atherosclerosis of aorta: Secondary | ICD-10-CM | POA: Insufficient documentation

## 2023-06-06 DIAGNOSIS — J90 Pleural effusion, not elsewhere classified: Secondary | ICD-10-CM | POA: Diagnosis not present

## 2023-06-06 DIAGNOSIS — K5730 Diverticulosis of large intestine without perforation or abscess without bleeding: Secondary | ICD-10-CM | POA: Diagnosis not present

## 2023-06-06 DIAGNOSIS — R400 Somnolence: Secondary | ICD-10-CM | POA: Insufficient documentation

## 2023-06-06 DIAGNOSIS — C7951 Secondary malignant neoplasm of bone: Secondary | ICD-10-CM | POA: Diagnosis not present

## 2023-06-06 DIAGNOSIS — C3490 Malignant neoplasm of unspecified part of unspecified bronchus or lung: Secondary | ICD-10-CM

## 2023-06-06 DIAGNOSIS — R443 Hallucinations, unspecified: Secondary | ICD-10-CM | POA: Diagnosis not present

## 2023-06-06 DIAGNOSIS — Z90722 Acquired absence of ovaries, bilateral: Secondary | ICD-10-CM | POA: Diagnosis not present

## 2023-06-06 DIAGNOSIS — K439 Ventral hernia without obstruction or gangrene: Secondary | ICD-10-CM | POA: Diagnosis not present

## 2023-06-06 DIAGNOSIS — I2510 Atherosclerotic heart disease of native coronary artery without angina pectoris: Secondary | ICD-10-CM | POA: Diagnosis not present

## 2023-06-06 DIAGNOSIS — R630 Anorexia: Secondary | ICD-10-CM | POA: Insufficient documentation

## 2023-06-06 DIAGNOSIS — K5900 Constipation, unspecified: Secondary | ICD-10-CM | POA: Insufficient documentation

## 2023-06-06 DIAGNOSIS — R110 Nausea: Secondary | ICD-10-CM | POA: Insufficient documentation

## 2023-06-06 DIAGNOSIS — C7972 Secondary malignant neoplasm of left adrenal gland: Secondary | ICD-10-CM | POA: Insufficient documentation

## 2023-06-06 DIAGNOSIS — Z923 Personal history of irradiation: Secondary | ICD-10-CM | POA: Insufficient documentation

## 2023-06-06 DIAGNOSIS — R102 Pelvic and perineal pain: Secondary | ICD-10-CM | POA: Insufficient documentation

## 2023-06-06 DIAGNOSIS — F17210 Nicotine dependence, cigarettes, uncomplicated: Secondary | ICD-10-CM | POA: Diagnosis not present

## 2023-06-06 DIAGNOSIS — Z5111 Encounter for antineoplastic chemotherapy: Secondary | ICD-10-CM | POA: Diagnosis not present

## 2023-06-06 DIAGNOSIS — M549 Dorsalgia, unspecified: Secondary | ICD-10-CM | POA: Diagnosis not present

## 2023-06-06 DIAGNOSIS — Z95828 Presence of other vascular implants and grafts: Secondary | ICD-10-CM

## 2023-06-06 DIAGNOSIS — Z8551 Personal history of malignant neoplasm of bladder: Secondary | ICD-10-CM | POA: Insufficient documentation

## 2023-06-06 DIAGNOSIS — Z79899 Other long term (current) drug therapy: Secondary | ICD-10-CM | POA: Insufficient documentation

## 2023-06-06 DIAGNOSIS — Z90710 Acquired absence of both cervix and uterus: Secondary | ICD-10-CM | POA: Insufficient documentation

## 2023-06-06 LAB — CBC WITH DIFFERENTIAL (CANCER CENTER ONLY)
Abs Immature Granulocytes: 0.13 10*3/uL — ABNORMAL HIGH (ref 0.00–0.07)
Basophils Absolute: 0.1 10*3/uL (ref 0.0–0.1)
Basophils Relative: 0 %
Eosinophils Absolute: 0.1 10*3/uL (ref 0.0–0.5)
Eosinophils Relative: 1 %
HCT: 27.6 % — ABNORMAL LOW (ref 36.0–46.0)
Hemoglobin: 8.2 g/dL — ABNORMAL LOW (ref 12.0–15.0)
Immature Granulocytes: 1 %
Lymphocytes Relative: 3 %
Lymphs Abs: 0.5 10*3/uL — ABNORMAL LOW (ref 0.7–4.0)
MCH: 22.9 pg — ABNORMAL LOW (ref 26.0–34.0)
MCHC: 29.7 g/dL — ABNORMAL LOW (ref 30.0–36.0)
MCV: 77.1 fL — ABNORMAL LOW (ref 80.0–100.0)
Monocytes Absolute: 1 10*3/uL (ref 0.1–1.0)
Monocytes Relative: 6 %
Neutro Abs: 15.5 10*3/uL — ABNORMAL HIGH (ref 1.7–7.7)
Neutrophils Relative %: 89 %
Platelet Count: 432 10*3/uL — ABNORMAL HIGH (ref 150–400)
RBC: 3.58 MIL/uL — ABNORMAL LOW (ref 3.87–5.11)
RDW: 20 % — ABNORMAL HIGH (ref 11.5–15.5)
WBC Count: 17.3 10*3/uL — ABNORMAL HIGH (ref 4.0–10.5)
nRBC: 0 % (ref 0.0–0.2)

## 2023-06-06 LAB — CMP (CANCER CENTER ONLY)
ALT: 10 U/L (ref 0–44)
AST: 13 U/L — ABNORMAL LOW (ref 15–41)
Albumin: 2.7 g/dL — ABNORMAL LOW (ref 3.5–5.0)
Alkaline Phosphatase: 128 U/L — ABNORMAL HIGH (ref 38–126)
Anion gap: 9 (ref 5–15)
BUN: 21 mg/dL (ref 8–23)
CO2: 27 mmol/L (ref 22–32)
Calcium: 8.7 mg/dL — ABNORMAL LOW (ref 8.9–10.3)
Chloride: 99 mmol/L (ref 98–111)
Creatinine: 0.72 mg/dL (ref 0.44–1.00)
GFR, Estimated: >60 mL/min (ref >60)
Glucose, Bld: 108 mg/dL — ABNORMAL HIGH (ref 70–99)
Potassium: 4.4 mmol/L (ref 3.5–5.1)
Sodium: 135 mmol/L (ref 135–145)
Total Bilirubin: 0.3 mg/dL (ref 0.0–1.2)
Total Protein: 7.3 g/dL (ref 6.5–8.1)

## 2023-06-06 MED ORDER — SODIUM CHLORIDE 0.9% FLUSH
10.0000 mL | Freq: Once | INTRAVENOUS | Status: AC
  Administered 2023-06-06: 10 mL

## 2023-06-06 MED ORDER — OXYCODONE HCL 10 MG PO TABS: 10.0000 mg | ORAL_TABLET | ORAL | 0 refills | Status: DC | PRN

## 2023-06-06 MED ORDER — OXYCODONE HCL 5 MG PO TABS
10.0000 mg | ORAL_TABLET | Freq: Once | ORAL | Status: DC
  Filled 2023-06-06: qty 2

## 2023-06-06 MED ORDER — HEPARIN SOD (PORK) LOCK FLUSH 100 UNIT/ML IV SOLN
500.0000 [IU] | Freq: Once | INTRAVENOUS | Status: AC
  Administered 2023-06-06: 500 [IU]

## 2023-06-06 NOTE — Progress Notes (Signed)
[facility] [phone]   [phone]  PROGRESS NOTE  Patient Care Team: [facility] [place] as PCP - General (Family Medicine)  Hematological/Oncological History # Metastatic Adenosquamous Lung Cancer  05/10/2022: last visit with Dr. Clelia Croft 05/26/2022: CT scan showed enlargement of RLL mass, right iliac crest mass with large soft tissue components extending in the iliacus and gluteus musculature. Additionally new pathologic right external iliac adenopathy.  06/07/2022: transition care to Dr. Leonides Schanz  07/12/2022: Cycle 1 Day 1 of docetaxel and ramucirumab 08/02/2022: Cycle 2 Day 1 of docetaxel and ramucirumab (dose reduced docetaxel from 75 mg/m2 to 60 mg/m2 due to poor tolerance) 08/23/2022: Cycle 3 Day 1 of docetaxel and ramucirumab 10/04/2022: Cycle 4 Day 1 of docetaxel and ramucirumab 11/01/2022: Cycle 5 Day 1 of docetaxel and ramucirumab 11/29/2022: Cycle 6 Day 1 of docetaxel and ramucirumab (dose reduced docetaxel from 60 mg/m2 to 50 mg/m2 due to persistent fatigue) 12/20/2022: Cycle 7 Day 1 of docetaxel and ramucirumab 01/31/2023: held Cycle 8 Day 1 of docetaxel and ramucirumab due to severe congestion/cough  Interval History:  Lauren Salazar 63 y.o. female with medical history significant for metastatic adenosquamous lung cancer presents for a follow up visit. The patient's last visit was on 04/25/2023. She presents today for a follow up.  Ms. Batty reports she is having quite a bit of pain in her back and right hip. She is not taking any pain medication at this time and is requesting medication today. She reports that she has little to no energy. She spends more than 50% of the day in bed. She has a poor appetite and lost another 5-6 lbs since the last visit. She reports occasional episodes of nausea without vomiting. She reports some constipation with the last bowel was 3 days ago. She takes colace with minimal relief. She denies  easy bruising or signs of bleeding. She reports having worsening shortness of breath with minimal exertion. She has a chronic cough with mainly clear sputum. She denies fevers, chills, sweats, chest pain or headaches. She has no other complaints. Rest of the 10 point ROS was otherwise negative.  MEDICAL HISTORY:  Past Medical History:  Diagnosis Date   Bladder tumor    Cancer (HCC)    Complication of anesthesia    slow to wake after spinal fusion, then was "looney" for the following 24 hours    Persistent dry cough    ongoing since feb 2020    SURGICAL HISTORY: Past Surgical History:  Procedure Laterality Date   CERVICAL FUSION  2005   c5-c6      CYSTOSCOPY WITH INJECTION N/A 02/01/2019   Procedure: CYSTOSCOPY WITH INJECTION;  Surgeon: Sebastian Ache, MD;  Location: [facility];  Service: Urology;  Laterality: N/A;   IR IMAGING GUIDED PORT INSERTION  07/25/2022   LYMPHADENECTOMY Bilateral 02/01/2019   Procedure: LYMPHADENECTOMY;  Surgeon: Sebastian Ache, MD;  Location: [facility];  Service: Urology;  Laterality: Bilateral;   ROBOT ASSISTED LAPAROSCOPIC COMPLETE CYSTECT ILEAL CONDUIT N/A 02/01/2019   Procedure: XI ROBOTIC ASSISTED LAPAROSCOPIC COMPLETE CYSTECTOMY, ILEAL CONDUIT, HYSTERECTOMY WITH BILATERAL SALPINGOOPHORECTOMY;  Surgeon: Sebastian Ache, MD;  Location: [facility];  Service: Urology;  Laterality: N/A;  6 HRS   TRANSURETHRAL RESECTION OF BLADDER TUMOR N/A 10/31/2018   Procedure: TRANSURETHRAL RESECTION OF BLADDER TUMOR (TURBT) WITH CYSTOSCOPY/ POSSIBLE INTRAVESICAL GEMCITABINE;  Surgeon: Rene Paci, MD;  Location: [facility];  Service: Urology;  Laterality: N/A;    SOCIAL HISTORY: Social History  Socioeconomic History   Marital status: Divorced    Spouse name: Not on file   Number of children: Not on file   Years of education: Not on file   Highest education level: Not on file  Occupational History   Not on file  Tobacco Use   Smoking status: Every Day    Current  packs/day: 1.00    Types: Cigarettes   Smokeless tobacco: Never   Tobacco comments:    smoking since age 76 , she is cutting back, using patches, trying to stop. 04/02/19  Vaping Use   Vaping status: Never Used  Substance and Sexual Activity   Alcohol use: No   Drug use: No   Sexual activity: Not Currently  Other Topics Concern   Not on file  Social History Narrative   Not on file   Social Drivers of Health   Financial Resource Strain: Not on file  Food Insecurity: Not on file  Transportation Needs: No Transportation Needs (04/02/2019)   PRAPARE - Administrator, Civil Service (Medical): No    Lack of Transportation (Non-Medical): No  Physical Activity: Not on file  Stress: Not on file  Social Connections: Not on file  Intimate Partner Violence: Not At Risk (04/02/2019)   Humiliation, Afraid, Rape, and Kick questionnaire    Fear of Current or Ex-Partner: No    Emotionally Abused: No    Physically Abused: No    Sexually Abused: No    FAMILY HISTORY: No family history on file.  ALLERGIES:  is allergic to celecoxib.  MEDICATIONS:  Current Outpatient Medications  Medication Sig Dispense Refill   acetaminophen (TYLENOL) 500 MG tablet 1 tablet as needed Orally every 6 hrs     albuterol (VENTOLIN HFA) 108 (90 Base) MCG/ACT inhaler 1-2 puffs as needed Inhalation every 4 hrs     benzonatate (TESSALON) 100 MG capsule Take 2 capsules (200 mg total) by mouth 3 (three) times daily as needed for cough. 60 capsule 2   Budeson-Glycopyrrol-Formoterol (BREZTRI AEROSPHERE) 160-9-4.8 MCG/ACT AERO 2 puffs Inhalation Twice a day for 14 days     diphenoxylate-atropine ([name]) 2.5-0.025 MG tablet Take 1 tablet by mouth 4 (four) times daily as needed for diarrhea or loose stools. 30 tablet 0   folic acid (FOLVITE) 1 MG tablet TAKE 1 TABLET(1 MG) BY MOUTH DAILY 90 tablet 3   lidocaine (LIDODERM) 5 % Place 1 patch onto the skin daily. Remove & Discard patch within 12 hours or as  directed by MD 30 patch 0   lidocaine-prilocaine (EMLA) cream Apply 1 Application topically as needed. 30 g 0   magic mouthwash (nystatin, diphenhydrAMINE, alum & mag hydroxide) suspension mixture Swish and swallow 10 mLs 3 (three) times daily. 240 mL 2   meloxicam (MOBIC) 15 MG tablet Take 1 tablet (15 mg total) by mouth daily. 30 tablet 0   nicotine (NICODERM CQ - DOSED IN MG/24 HOURS) 21 mg/24hr patch Place 1 patch (21 mg total) onto the skin daily. 28 patch 1   oxyCODONE 10 MG TABS Take 1 tablet (10 mg total) by mouth every 4 (four) hours as needed for severe pain (pain score 7-10). 60 tablet 0   prochlorperazine (COMPAZINE) 10 MG tablet Take 1 tablet (10 mg total) by mouth every 6 (six) hours as needed for nausea or vomiting. 30 tablet 2   SYMBICORT 80-4.5 MCG/ACT inhaler Inhale into the lungs.     No current facility-administered medications for this visit.    REVIEW OF  SYSTEMS:   Constitutional: ( - ) fevers, ( - )  chills , ( - ) night sweats Eyes: ( - ) blurriness of vision, ( - ) double vision, ( - ) watery eyes Ears, nose, mouth, throat, and face: ( - ) mucositis, ( - ) sore throat Respiratory: (+ ) cough, ( + ) dyspnea, ( - ) wheezes Cardiovascular: ( - ) palpitation, ( - ) chest discomfort, ( - ) lower extremity swelling Gastrointestinal:  ( + ) nausea, ( - ) heartburn, ( -) change in bowel habits Skin: ( - ) abnormal skin rashes Lymphatics: ( - ) new lymphadenopathy, ( - ) easy bruising Neurological: ( - ) numbness, ( - ) tingling, ( - ) new weaknesses Behavioral/Psych: ( - ) mood change, ( - ) new changes  All other systems were reviewed with the patient and are negative.  PHYSICAL EXAMINATION: ECOG PERFORMANCE STATUS: 3 - Symptomatic, >50% confined to bed  Vitals:   06/06/23 1202  BP: 125/89  Pulse: (!) 129  Resp: 18  Temp: 98.1 F (36.7 C)  SpO2: 98%   Filed Weights   06/06/23 1202  Weight: 99 lb 9.6 oz (45.2 kg)    GENERAL: Chronically ill-appearing  middle-aged Caucasian female, alert, cachetic.  SKIN: skin color, texture, turgor are normal, no rashes or significant lesions EYES: conjunctiva are pink and non-injected, sclera clear LUNGS: clear to auscultation and percussion with normal breathing effort HEART: regular rate & rhythm and no murmurs and no lower extremity edema Musculoskeletal: no cyanosis of digits and no clubbing  PSYCH: alert & oriented x 3, fluent speech NEURO: no focal motor/sensory deficits  LABORATORY DATA:  I have reviewed the data as listed    Latest Ref Rng & Units 06/06/2023   11:41 AM 04/25/2023   12:18 PM 02/14/2023   11:02 AM  CBC  WBC 4.0 - 10.5 K/uL 17.3  8.9  5.9   Hemoglobin 12.0 - 15.0 g/dL 8.2  09.8  11.9   Hematocrit 36.0 - 46.0 % 27.6  32.4  35.6   Platelets 150 - 400 K/uL 432  414  187        Latest Ref Rng & Units 06/06/2023   11:41 AM 04/25/2023   12:18 PM 02/14/2023   11:02 AM  CMP  Glucose 70 - 99 mg/dL 147  829  562   BUN 8 - 23 mg/dL 21  17  17    Creatinine 0.44 - 1.00 mg/dL 1.30  8.65  7.84   Sodium 135 - 145 mmol/L 135  135  140   Potassium 3.5 - 5.1 mmol/L 4.4  4.3  3.8   Chloride 98 - 111 mmol/L 99  98  101   CO2 22 - 32 mmol/L 27  29  29    Calcium 8.9 - 10.3 mg/dL 8.7  9.0  9.3   Total Protein 6.5 - 8.1 g/dL 7.3  7.3  7.0   Total Bilirubin 0.0 - 1.2 mg/dL 0.3  0.3  0.3   Alkaline Phos 38 - 126 U/L 128  95  75   AST 15 - 41 U/L 13  10  12    ALT 0 - 44 U/L 10  7  9      RADIOGRAPHIC STUDIES: CT CHEST ABDOMEN PELVIS W CONTRAST Result Date: 06/03/2023 CLINICAL DATA:  Metastatic non-small cell lung cancer restaging * Tracking Code: BO * EXAM: CT CHEST, ABDOMEN, AND PELVIS WITH CONTRAST TECHNIQUE: Multidetector CT imaging of the chest, abdomen and pelvis was  performed following the standard protocol during bolus administration of intravenous contrast. RADIATION DOSE REDUCTION: This exam was performed according to the departmental dose-optimization program which includes automated  exposure control, adjustment of the mA and/or kV according to patient size and/or use of iterative reconstruction technique. CONTRAST:  OMNIPAQUE IOHEXOL 300 MG/ML  SOLN COMPARISON:  02/06/2023 FINDINGS: CT CHEST FINDINGS Cardiovascular: Right chest port catheter. Aortic atherosclerosis. Normal heart size. Left and right coronary artery calcifications no pericardial effusion. Mediastinum/Nodes: Interval enlargement of subcarinal lymph node measuring 2.9 x 1.9 cm, previously 2.0 x 1.1 cm (series 2, image 29). Thyroid gland, trachea, and esophagus demonstrate no significant findings. Lungs/Pleura: Severe emphysema. Significant enlargement of a large, necrotic mass of the right lower lobe, measuring 7.1 x 4.8 cm, previously 5.8 x 3.7 cm when measured similarly (series 2, image 29). Significant enlargement of a moderate right pleural effusion with associated pleural thickening and enhancement (series 2, image 47). Musculoskeletal: No chest wall abnormality. No acute osseous findings. CT ABDOMEN PELVIS FINDINGS Hepatobiliary: No solid liver abnormality is seen. No gallstones, gallbladder wall thickening, or biliary dilatation. Pancreas: Unremarkable. No pancreatic ductal dilatation or surrounding inflammatory changes. Spleen: Normal in size without significant abnormality. Adrenals/Urinary Tract: Significantly enlarged bulky bilateral adrenal metastases, measuring 6.0 x 3.3 cm on the left, previously 2.8 x 1.4 cm (series 2, image 60), and 4.9 x 3.2 cm on the right, previously 3.5 x 2.3 cm (series 2, image 67). Kidneys are normal, without renal calculi, solid lesion, or hydronephrosis. Status post cystectomy with right lower quadrant ileal conduit urinary diversion. Stomach/Bowel: Stomach is within normal limits. Appendix appears normal. No evidence of bowel wall thickening, distention, or inflammatory changes. Sigmoid diverticulosis. Vascular/Lymphatic: Severe aortic atherosclerosis. Unchanged, chronic short  segment occlusion of the proximal right common femoral artery. Interval enlargement of retroperitoneal and right iliac lymph nodes, index left retroperitoneal node measuring 1.8 x 1.5 cm, previously 1.2 x 0.9 cm (series 2, image 73). Reproductive: Status post hysterectomy. Other: Midline ventral hernia containing nonobstructed transverse colon (series 2, image 80). No ascites. Musculoskeletal: Interval enlargement of soft tissue component associated with a large mixed lytic and sclerotic osseous metastasis of the right iliac crest measuring 2.5 x 2.0 cm (series 2, image 81). Nonacute fracture of the right sacral ala (series 2, image 73). IMPRESSION: 1. Significant enlargement of a large, necrotic mass of the right lower lobe, consistent with primary bronchogenic malignancy. 2. Significant enlargement of a moderate right pleural effusion with associated pleural thickening and enhancement, consistent with malignant effusion. 3. Significantly enlarged bulky bilateral adrenal metastases. 4. Interval enlargement of subcarinal, retroperitoneal, and right iliac lymph nodes, consistent with nodal metastases. 5. Interval enlargement of soft tissue component associated with a large mixed lytic and sclerotic osseous metastasis of the right iliac crest. 6. Constellation of findings is consistent with progression of primary lung malignancy and associated metastatic disease. 7. Nonacute fracture of the right sacral ala. 8. Status post cystectomy with right lower quadrant ileal conduit urinary diversion. 9. Emphysema. 10. Coronary artery disease. Aortic Atherosclerosis (ICD10-I70.0) and Emphysema (ICD10-J43.9). Electronically Signed   By: Jearld Lesch M.D.   On: 06/03/2023 13:33    ASSESSMENT & PLAN Lauren Salazar is a 63 y.o. female with medical history significant for metastatic adenosquamous lung cancer presents for a follow up visit.  She is status post robotic assisted laparoscopic cystectomy and pelvic  exoneration including hysterectomy, bilateral salpingo-oophorectomy and ileoconduit diversion completed on February 01, 2019.  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   Radiation therapy with weekly chemotherapy utilizing carboplatin and paclitaxel.  Week 1 of chemotherapy started on May 14, 2019.  Therapy concluded in February 2021.   Durvalumab 10 mg/kg cycle 1 on Sep 03, 2019 every 14 days.  She completed 4 cycles of therapy.   Carboplatin with Alimta and Pembrolizumab started on August 04, 2020.  She completed 3 cycles of therapy in June 2022.   Pembrolizumab 400 mg every 6 weeks started on December 22, 2020.  Therapy discontinued and March 2023 due to patient's preference.  Progression noted on CT scan from 05/26/2022.   Completed palliative radiation to her R hip/pelvis on 07/01/2022. Received 30 Gy in 10 Fx.   Started Docetaxel and Ramucirumab on 07/12/2022.  # Metastatic Adenosquamous Carcinoma of the Lung  --patient has undergone numerous lines of treatment including chemoradiation followed by immunotherapy, subsequent carbo/pem/pem and then monotherapy pembrolizumab --progression of disease noted on 05/26/2022 CT scan. --Started docetaxel plus ramucirumab on 07/12/2022 --Due to poor tolerance with Cycle 1, dose reduced Doxcetaxel from 75 mg/m2 to 60 mg/m2 starting Cycle 2.  --Due to persistent fatigue, dose reduced docetaxel from 60 mg/m2 to 50 mg/m2 starting Cycle 6 on 11/29/2022.  --Most CT scan from 11/18/2022 continued treatment response with stable RLL lung mass, decrease in size of right adrenal nodule, left retroperitoneal lymph node and sclerosis of bone metastases. No evidence of new or progressive disease.  Plan: --previously on Docetaxel/Ramucircumab, currently on hold due to worsening deconditioning.  --CT CAP from 05/25/2023 shows marked progression.  --Labs from today show WBC 17.3, Hgb 8.2, MCV 77.1, Plt 432, Creatinine and LFTs in range.   --Re-discussed goals of care due to declining performance status, deconditioned status and continued weight loss.Patient is no longer a candidate for chemotherapy as it would more likely shorten her life than extend her life span.  She voiced understanding. --Patient agreed to following up with palliative care NP for continued goals of care discussion and referral to hospice care.  --Placeholder for follow up in 4 weeks but okay to cancel if patient enrolls in hospice care.   # Abdominal Pain/ Cancer Related Pain --Patient completed palliative radiation with radiation oncology to right hip/pelvis with improvement of pain --Patient currently has no pain medication so will bridge with oxycodone 10 mg q 4 hours until she follows up with palliative care team.   #Nausea: --Continue to take zofran and compazine as needed.   #Constipation: --Advised to take miralax daily with colace.   No orders of the defined types were placed in this encounter.   All questions were answered. The patient knows to call the clinic with any problems, questions or concerns.  A total of more than 40  minutes were spent on this encounter with face-to-face time and non-face-to-face time, including preparing to see the patient, ordering tests and/or medications, counseling the patient and coordination of care as outlined above.   Georga Kaufmann PA-C Dept of Hematology and Oncology [facility] at [facility] Phone: [phone]  Patient was seen with Dr. Leonides Schanz  06/06/2023 9:28 PM

## 2023-06-08 ENCOUNTER — Telehealth: Payer: Self-pay | Admitting: *Deleted

## 2023-06-08 NOTE — Telephone Encounter (Signed)
Received call from pt. She is requesting Hydrocodone/apap for pain during the day. She uses the Oxycodone 10 mg at night only-it makes her too sleepy during the day. Pt uses [facility] on [address]. Dr. Leonides Schanz made aware.

## 2023-06-09 ENCOUNTER — Other Ambulatory Visit: Payer: Self-pay | Admitting: Hematology and Oncology

## 2023-06-09 MED ORDER — HYDROCODONE-ACETAMINOPHEN 5-325 MG PO TABS: 1.0000 | ORAL_TABLET | Freq: Four times a day (QID) | ORAL | 0 refills | Status: DC | PRN

## 2023-06-09 NOTE — Progress Notes (Signed)
Palliative Medicine [facility]  [phone] [phone]) [phone]   Name: Lauren Salazar Date: 06/09/2023 MRN: [id number]  DOB: 01-10-1961  Patient Care Team: [facility] [place] as PCP - General (Family Medicine)   I connected with Lauren Salazar on 06/09/23 at 10:00 AM EST by phone and verified that I am speaking with the correct person using two identifiers.   I discussed the limitations, risks, security and privacy concerns of performing an evaluation and management service by telemedicine and the availability of in-person appointments. I also discussed with the patient that there may be a patient responsible charge related to this service. The patient expressed understanding and agreed to proceed.   Other persons participating in the visit and their role in the encounter: n/a   Patient's location: home  Provider's location: [facility]   Chief Complaint: follow up of symptom management    INTERVAL HISTORY: Lauren Salazar is a 63 y.o. 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 Patient is also status post robotic assisted laparoscopic cystectomy and pelvic exoneration including hysterectomy, bilateral oophorectomy and ileoconduit diversion. Palliative ask to see for symptom and pain management and goals of care.   SOCIAL HISTORY:    Lauren Salazar reports that she has been smoking cigarettes. She has never used smokeless tobacco. She reports that she does not drink alcohol and does not use drugs.  ADVANCE DIRECTIVES:  None on file  CODE STATUS: Full code  PAST MEDICAL HISTORY: Past Medical History:  Diagnosis Date   Bladder tumor    Cancer (HCC)    Complication of anesthesia    slow to wake after spinal fusion, then was "looney" for the following 24 hours    Persistent dry cough    ongoing since feb  2020    ALLERGIES:  is allergic to celecoxib.  MEDICATIONS:  Current Outpatient Medications  Medication Sig Dispense Refill   acetaminophen (TYLENOL) 500 MG tablet 1 tablet as needed Orally every 6 hrs     albuterol (VENTOLIN HFA) 108 (90 Base) MCG/ACT inhaler 1-2 puffs as needed Inhalation every 4 hrs     benzonatate (TESSALON) 100 MG capsule Take 2 capsules (200 mg total) by mouth 3 (three) times daily as needed for cough. 60 capsule 2   Budeson-Glycopyrrol-Formoterol (BREZTRI AEROSPHERE) 160-9-4.8 MCG/ACT AERO 2 puffs Inhalation Twice a day for 14 days     diphenoxylate-atropine ([name]) 2.5-0.025 MG tablet Take 1 tablet by mouth 4 (four) times daily as needed for diarrhea or loose stools. 30 tablet 0   folic acid (FOLVITE) 1 MG tablet TAKE 1 TABLET(1 MG) BY MOUTH DAILY 90 tablet 3   lidocaine (LIDODERM) 5 % Place 1 patch onto the skin daily. Remove & Discard patch within 12 hours or as directed by MD 30 patch 0   lidocaine-prilocaine (EMLA) cream Apply 1 Application topically as needed. 30 g 0   magic mouthwash (nystatin, diphenhydrAMINE, alum & mag hydroxide) suspension mixture Swish and swallow 10 mLs 3 (three) times daily. 240 mL 2   meloxicam (MOBIC) 15 MG tablet Take 1 tablet (15 mg total) by mouth daily. 30 tablet 0   nicotine (NICODERM CQ - DOSED IN MG/24 HOURS) 21 mg/24hr patch Place 1 patch (21 mg total) onto the skin daily. 28 patch 1   oxyCODONE 10 MG TABS Take 1 tablet (10 mg total) by mouth every 4 (  four) hours as needed for severe pain (pain score 7-10). 60 tablet 0   prochlorperazine (COMPAZINE) 10 MG tablet Take 1 tablet (10 mg total) by mouth every 6 (six) hours as needed for nausea or vomiting. 30 tablet 2   SYMBICORT 80-4.5 MCG/ACT inhaler Inhale into the lungs.     No current facility-administered medications for this visit.    VITAL SIGNS: There were no vitals taken for this visit. There were no vitals filed for this visit.  Estimated body mass index is 16.08  kg/m as calculated from the following:   Height as of 07/25/22: 5\' 6"  (1.676 m).   Weight as of 06/06/23: 99 lb 9.6 oz (45.2 kg).   PERFORMANCE STATUS (ECOG) : 1 - Symptomatic but completely ambulatory  IMPRESSION:  I connected with Lauren Salazar by phone. She denies nausea, vomiting, or diarrhea. Ongoing fatigue and uncontrolled pain. States she is unable to walk or do much ADLs due to pain and discomfort. Appetite is fair. Some days are better than others.   We discussed her pain at length. She complains of persistent pain in her right hip. She describes pain as constant, stabbing, throbbing. She spends most of her time sedentary due to her pain and overall health condition. Expresses pain being more severe prior to pain. She is currently taking Oxycodone  10mg  every 4 hours. Is requiring around the clock. She feels that this makes her drowsy also contributing to inability to do things during the day. Would like to transition back to hydrocodone as this helped with her pain and did not make her as drowsy. Lauren Salazar was taking Xtampza every 12 hours however is no longer on this medication. Given level of uncontrolled pain education provided on restarting long acting pain medication. Unfortunately Medicaid no longer will cover Xtampza. Education provided on use of MS Contin including administration, efficacy, and potential side effects. She verbalized understanding.   Education provided on importance of bowel regimen to include daily Miralax. She reports some challenges with constipation going 2-3 days without a bowel movement. She is taking Colace. Recommended Senna-S 2 tablets at bedtime as she prefers pills. If no improvement may increase to 2 tablets twice daily.   All questions answered and support provided.   PLAN:  Cancer Related Pain Patient reports pain is not well controlled. Pain is persistent and severe limiting ability to be as active as possible. Patient requesting to transition  back to hydrocodone as she feels the oxycodone is making her drowsy.  Discontinue Oxycodone. Hydrocodone 5/325 mg every 6 hours as needed for breakthrough pain. Prescription sent in on 2/14 per Dr. Leonides Schanz. Patient instructed to pick-up from pharmacy.  MS Contin 15mg  every 12 hours   Constipation Occasional constipation. Education provided on importance of bowel regimen in the setting of opioid use.  Senna S 2 tablets at bedtime  Follow-Up  We will continue to closely monitor and assist with symptom management as needed. Palliative will plan to see patient back in 3-4 weeks in collaboration to other oncology appointments. Will follow-up by phone in 1 week.    Patient expressed understanding and was in agreement with this plan. She also understands that She can call the clinic at any time with any questions, concerns, or complaints.   Any controlled substances utilized were prescribed in the context of palliative care. PDMP has been reviewed.    Visit consisted of counseling and education dealing with the complex and emotionally intense issues of symptom management and palliative care  in the setting of serious and potentially life-threatening illness.  [name], AGPCNP-BC  Palliative Medicine Team/[facility]

## 2023-06-12 ENCOUNTER — Encounter: Payer: Self-pay | Admitting: Nurse Practitioner

## 2023-06-12 ENCOUNTER — Inpatient Hospital Stay (HOSPITAL_BASED_OUTPATIENT_CLINIC_OR_DEPARTMENT_OTHER): Payer: Medicaid Other | Admitting: Nurse Practitioner

## 2023-06-12 DIAGNOSIS — C3431 Malignant neoplasm of lower lobe, right bronchus or lung: Secondary | ICD-10-CM

## 2023-06-12 DIAGNOSIS — K5903 Drug induced constipation: Secondary | ICD-10-CM | POA: Diagnosis not present

## 2023-06-12 DIAGNOSIS — Z515 Encounter for palliative care: Secondary | ICD-10-CM

## 2023-06-12 DIAGNOSIS — G893 Neoplasm related pain (acute) (chronic): Secondary | ICD-10-CM

## 2023-06-12 DIAGNOSIS — R530 Neoplastic (malignant) related fatigue: Secondary | ICD-10-CM

## 2023-06-12 MED ORDER — MORPHINE SULFATE ER 15 MG PO TBCR: 15.0000 mg | EXTENDED_RELEASE_TABLET | Freq: Two times a day (BID) | ORAL | 0 refills | Status: DC

## 2023-06-12 MED ORDER — SENNOSIDES-DOCUSATE SODIUM 8.6-50 MG PO TABS: 2.0000 | ORAL_TABLET | Freq: Every day | ORAL | 3 refills | Status: DC

## 2023-06-13 ENCOUNTER — Other Ambulatory Visit: Payer: Self-pay | Admitting: Nurse Practitioner

## 2023-06-13 ENCOUNTER — Encounter: Payer: Self-pay | Admitting: Hematology and Oncology

## 2023-06-13 ENCOUNTER — Encounter: Payer: Self-pay | Admitting: Physician Assistant

## 2023-06-13 DIAGNOSIS — C3431 Malignant neoplasm of lower lobe, right bronchus or lung: Secondary | ICD-10-CM

## 2023-06-13 DIAGNOSIS — Z515 Encounter for palliative care: Secondary | ICD-10-CM

## 2023-06-13 DIAGNOSIS — G893 Neoplasm related pain (acute) (chronic): Secondary | ICD-10-CM

## 2023-06-13 MED ORDER — HYDROCODONE-ACETAMINOPHEN 5-325 MG PO TABS: 1.0000 | ORAL_TABLET | Freq: Four times a day (QID) | ORAL | 0 refills | Status: DC | PRN

## 2023-06-13 MED ORDER — HYDROCODONE-ACETAMINOPHEN 5-325 MG PO TABS
1.0000 | ORAL_TABLET | Freq: Four times a day (QID) | ORAL | 0 refills | Status: DC | PRN
Start: 2023-06-13 — End: 2023-06-13

## 2023-06-16 ENCOUNTER — Telehealth: Payer: Self-pay | Admitting: *Deleted

## 2023-06-16 NOTE — Telephone Encounter (Signed)
Received call from pt. She states she is having increased pain and wants to get chemo again to help control her pain. Dr. Leonides Schanz and Georga Kaufmann, PA have both talked with her at length regarding her chemo-that she is not strong enough for chemo, nor are her labs #s strong enough to withstand more chemo. Advised that her last CBC was high WBC and low HGB. And that if we treated her she would most likely end up in worse shape. After much conversation, pt is agreeable to seeing if we can help her current lung infection (though she has a very large mass in her RUL that continues to get larger). Hopefuly, this RN was able to convince pt to take her pain medications to control her right leg pain-she has not started the MSContin 15 mg as she doesn't want to sleep all the time. We talked about the difficult balance between controlling pain and keeping awake all the the time. Pt stated several times, "I don't want to die, I'm not ready to die. Something needs to be done" She does have an appt with Nolon Bussing, NP in palliative care on 06/19/23. Encouraged her to discuss these issues with Lowella Bandy and to also ask about treating her potential lung infection and pain control. Dr. Leonides Schanz aware of this call. He does not recommend any chemo at this time.

## 2023-06-19 ENCOUNTER — Encounter: Payer: Self-pay | Admitting: Physician Assistant

## 2023-06-19 ENCOUNTER — Encounter: Payer: Self-pay | Admitting: Nurse Practitioner

## 2023-06-19 ENCOUNTER — Encounter: Payer: Self-pay | Admitting: Hematology and Oncology

## 2023-06-19 ENCOUNTER — Inpatient Hospital Stay (HOSPITAL_BASED_OUTPATIENT_CLINIC_OR_DEPARTMENT_OTHER): Payer: Medicaid Other | Admitting: Nurse Practitioner

## 2023-06-19 DIAGNOSIS — C3431 Malignant neoplasm of lower lobe, right bronchus or lung: Secondary | ICD-10-CM | POA: Diagnosis not present

## 2023-06-19 DIAGNOSIS — G893 Neoplasm related pain (acute) (chronic): Secondary | ICD-10-CM | POA: Diagnosis not present

## 2023-06-19 DIAGNOSIS — K5903 Drug induced constipation: Secondary | ICD-10-CM

## 2023-06-19 DIAGNOSIS — Z515 Encounter for palliative care: Secondary | ICD-10-CM | POA: Diagnosis not present

## 2023-06-19 DIAGNOSIS — R051 Acute cough: Secondary | ICD-10-CM

## 2023-06-19 DIAGNOSIS — R530 Neoplastic (malignant) related fatigue: Secondary | ICD-10-CM

## 2023-06-19 MED ORDER — AZITHROMYCIN 250 MG PO TABS: ORAL_TABLET | ORAL | 0 refills | Status: DC

## 2023-06-19 MED ORDER — DEXAMETHASONE 4 MG PO TABS: ORAL_TABLET | ORAL | 0 refills | Status: DC

## 2023-06-19 MED ORDER — MELOXICAM 15 MG PO TABS: 15.0000 mg | ORAL_TABLET | Freq: Every day | ORAL | 3 refills | Status: DC

## 2023-06-19 NOTE — Progress Notes (Signed)
 Palliative Medicine [facility]  Telephone:(336) [phone] [phone]) [phone]   Name: Lauren Salazar Date: 06/19/2023 MRN: [id number]  DOB: October 19, 1960  Patient Care Team: [facility] [place] as PCP - General (Family Medicine)   I connected with Lauren Salazar on 06/19/23 at 11:00 AM EST by phone and verified that I am speaking with the correct person using two identifiers.   I discussed the limitations, risks, security and privacy concerns of performing an evaluation and management service by telemedicine and the availability of in-person appointments. I also discussed with the patient that there may be a patient responsible charge related to this service. The patient expressed understanding and agreed to proceed.   Other persons participating in the visit and their role in the encounter: n/a   Patient's location: home  Provider's location: [facility]   Chief Complaint: follow up of symptom management    INTERVAL HISTORY: Lauren Salazar is a 63 y.o. 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 Patient is also status post robotic assisted laparoscopic cystectomy and pelvic exoneration including hysterectomy, bilateral oophorectomy and ileoconduit diversion. Palliative ask to see for symptom and pain management and goals of care.   SOCIAL HISTORY:    Lauren Salazar reports that she has been smoking cigarettes. She has never used smokeless tobacco. She reports that she does not drink alcohol and does not use drugs.  ADVANCE DIRECTIVES:  None on file  CODE STATUS: Full code  PAST MEDICAL HISTORY: Past Medical History:  Diagnosis Date   Bladder tumor    Cancer (HCC)    Complication of anesthesia    slow to wake after spinal fusion, then was "looney" for the following 24 hours    Persistent dry cough    ongoing since feb  2020    ALLERGIES:  is allergic to celecoxib.  MEDICATIONS:  Current Outpatient Medications  Medication Sig Dispense Refill   acetaminophen (TYLENOL) 500 MG tablet 1 tablet as needed Orally every 6 hrs     albuterol (VENTOLIN HFA) 108 (90 Base) MCG/ACT inhaler 1-2 puffs as needed Inhalation every 4 hrs     benzonatate (TESSALON) 100 MG capsule Take 2 capsules (200 mg total) by mouth 3 (three) times daily as needed for cough. 60 capsule 2   Budeson-Glycopyrrol-Formoterol (BREZTRI AEROSPHERE) 160-9-4.8 MCG/ACT AERO 2 puffs Inhalation Twice a day for 14 days     diphenoxylate-atropine ([name]) 2.5-0.025 MG tablet Take 1 tablet by mouth 4 (four) times daily as needed for diarrhea or loose stools. 30 tablet 0   folic acid (FOLVITE) 1 MG tablet TAKE 1 TABLET(1 MG) BY MOUTH DAILY 90 tablet 3   HYDROcodone-acetaminophen (NORCO/VICODIN) 5-325 MG tablet Take 1 tablet by mouth every 6 (six) hours as needed for moderate pain (pain score 4-6) or severe pain (pain score 7-10) (breakthrough pain in between use of morphine extended release). 45 tablet 0   lidocaine (LIDODERM) 5 % Place 1 patch onto the skin daily. Remove & Discard patch within 12 hours or as directed by MD 30 patch 0   lidocaine-prilocaine (EMLA) cream Apply 1 Application topically as needed. 30 g 0   magic mouthwash (nystatin, diphenhydrAMINE, alum & mag hydroxide) suspension mixture Swish and swallow 10 mLs 3 (three) times daily. 240 mL 2   meloxicam (MOBIC) 15 MG tablet Take 1 tablet (15 mg total) by mouth daily. 30 tablet 0  morphine (MS CONTIN) 15 MG 12 hr tablet Take 1 tablet (15 mg total) by mouth every 12 (twelve) hours. 60 tablet 0   nicotine (NICODERM CQ - DOSED IN MG/24 HOURS) 21 mg/24hr patch Place 1 patch (21 mg total) onto the skin daily. 28 patch 1   prochlorperazine (COMPAZINE) 10 MG tablet Take 1 tablet (10 mg total) by mouth every 6 (six) hours as needed for nausea or vomiting. 30 tablet 2   senna-docusate (SENNA S) 8.6-50  MG tablet Take 2 tablets by mouth at bedtime. 60 tablet 3   SYMBICORT 80-4.5 MCG/ACT inhaler Inhale into the lungs.     No current facility-administered medications for this visit.    VITAL SIGNS: There were no vitals taken for this visit. There were no vitals filed for this visit.  Estimated body mass index is 16.08 kg/m as calculated from the following:   Height as of 07/25/22: 5\' 6"  (1.676 m).   Weight as of 06/06/23: 99 lb 9.6 oz (45.2 kg).   PERFORMANCE STATUS (ECOG) : 1 - Symptomatic but completely ambulatory  Discussed the use of AI scribe software for clinical note transcription with the patient, who gave verbal consent to proceed.  IMPRESSION:  I connected with Lauren Salazar by phone for pain management follow-up. She is reporting ongoing pain, fatigue, cough, and concerns of unresolved pneumonia. She denies concerns with nausea, vomiting, or diarrhea. She reports constipation issues, noting that Colace alone is insufficient. She has not been using Senokot. Prefers pill versus liquid. Instructed patient to begin taking Senna-S 2 tablets at bedtime. If no improvement she will then increase to 2 tablets twice daily. Appetite fluctuates; some days are better than others.   We discussed her pain at length. Lauren Salazar reports experiencing significant side effects from morphine, including hallucinations of seeing animals such as rats and small animals in her room, which led to discontinuation of the medication. She describes sleeping for extended periods, up to fifteen hours, while on morphine. Currently, she is taking hydrocodone approximately every six to seven hours, which she finds effective in managing her pain without the adverse effects experienced with morphine. Instructed she may increase frequency to every 4-6 hours as needed. Prefers not to consider long acting at this time. Request for refill of Mobic as she found this helpful with some arthritic inflammation  discomfort.   Lauren Salazar state she has been experiencing a persistent respiratory infection since October, following a bout of pneumonia. Her symptoms include coughing up phlegm, which initially was clear, then turned yellow, and recently had a green tint. She experiences shortness of breath, especially when walking through her house. She uses a Breztri inhaler twice daily to manage her symptoms and has not been using Symbicort due to adverse effects, including oral thrush-like symptoms and mouth redness. She has been on four rounds of antibiotics since October, with some improvement noted after starting amoxicillin. Reports the amoxicillin allowed her to better with less green phlegm and improved breathing, though significant coughing persists, particularly in the mornings. Previously on Hycodan for her cough. Has [employer] however is not taking as she does not feel they are working. I encouraged patient to follow-up with her PCP for ongoing management and support also. Will send in short course of dexamethasone and Z-pak with understanding her PCP will need to manage further. She verbalized understanding.   Goals of Care We discussed patient's current illness and disease trajectory. She is frustrated with her current medical care, feeling  that she does not have adequate treatment options. She is concerned about her inability to resume chemotherapy due to the ongoing infection and is seeking further treatment to address the lung infection so she can continue cancer treatment. I discussed with her length reasoning behind oncologist complex decisions as her overall functional state is not able to tolerate treatment at this time explaining this would cause her body more distress than of help. Patient verbalized understanding however is adamant that she will improve enough to restart treatment also expressing she is willing to obtain a second opinion.   I empathetically approached goals of care  discussions. Lauren Salazar is clear in her direct expressed wishes to continue to treat the treatable allowing her every opportunity to thrive despite current poor quality of life. She states, "I am not ready to die and will not sit her and die!" States that is not an option she is willing to accept.   All questions answered to the best of my ability and support provided. I discussed the importance of continued conversation with family and their medical providers regarding overall plan of care and treatment options, ensuring decisions are within the context of the patients values and GOCs.   Assessment and Plan Cancer Related Pain Management Patient reports experiencing hallucinations and increased drowsiness with MS Contin. Hydrocodone providing adequate pain control every 6-7 hours. -She has self-discontinued use of MS Contin. -Continue Hydrocodone as needed, can be taken as soon as every 4 hours if necessary. -Mobic daily -Will continue to closely monitor and adjust regimen as needed. Not interested in long-acting at this time.   Constipation Patient reports irregular bowel movements despite taking Colace. -Discontinue Colace. -Start Senokot-S, two tablets at bedtime. If ineffective, increase to two tablets in the morning and two tablets at bedtime.  Respiratory Infection Patient reports productive cough with greenish sputum, shortness of breath, and history of pneumonia.  -Start Azithromycin for 5 days. -Start Dexamethasone twice daily for 7 days, then once daily for 7 days. -Continue Breztri inhaler twice daily as prescribed. -Discontinued Symbicort due to adverse effects per patient. -If symptoms persist or worsen, refer to primary care for further workup.  Follow-up Patient to be seen for port flush on 07/04/2023. Plan to touch base with patient on 07/03/2023.  Patient expressed understanding and was in agreement with this plan. She also understands that She can call the clinic at  any time with any questions, concerns, or complaints.   Any controlled substances utilized were prescribed in the context of palliative care. PDMP has been reviewed.    Visit consisted of counseling and education dealing with the complex and emotionally intense issues of symptom management and palliative care in the setting of serious and potentially life-threatening illness.  [name], AGPCNP-BC  Palliative Medicine Team/[facility]

## 2023-06-23 ENCOUNTER — Other Ambulatory Visit: Payer: Self-pay

## 2023-06-23 MED ORDER — NYSTATIN 100000 UNIT/ML MT SUSP: 10.0000 mL | Freq: Three times a day (TID) | OROMUCOSAL | 2 refills | Status: DC

## 2023-06-23 NOTE — Telephone Encounter (Signed)
 Pt called c/o "coughing up blood" which started yesterday 2/27 and became progressively darker. Pt attributed it to her inhaler use and stopped using it, no blood since then. Pt also c/o mouth sores, when discussed with RN pt admits to not rising her mouth out after using inhaler and to experiencing mouth sores. Pt not c/o nausea or vomiting or painful swallowing. RN discussed strict ED precautions should this happen again or become uncontrolled and refilled magic mouthwash. Pt verbalized understanding, no further needs at this time.

## 2023-06-25 ENCOUNTER — Emergency Department (HOSPITAL_COMMUNITY)

## 2023-06-25 ENCOUNTER — Encounter (HOSPITAL_COMMUNITY): Payer: Self-pay

## 2023-06-25 ENCOUNTER — Other Ambulatory Visit: Payer: Self-pay

## 2023-06-25 ENCOUNTER — Inpatient Hospital Stay (HOSPITAL_COMMUNITY)
Admission: EM | Admit: 2023-06-25 | Discharge: 2023-07-10 | DRG: 871 | Disposition: A | Discharge status: Skilled Nursing Facility | Attending: Family Medicine | Admitting: Family Medicine

## 2023-06-25 DIAGNOSIS — L89310 Pressure ulcer of right buttock, unstageable: Secondary | ICD-10-CM | POA: Diagnosis present

## 2023-06-25 DIAGNOSIS — C3430 Malignant neoplasm of lower lobe, unspecified bronchus or lung: Secondary | ICD-10-CM | POA: Diagnosis not present

## 2023-06-25 DIAGNOSIS — J189 Pneumonia, unspecified organism: Principal | ICD-10-CM | POA: Diagnosis present

## 2023-06-25 DIAGNOSIS — Z515 Encounter for palliative care: Secondary | ICD-10-CM | POA: Diagnosis not present

## 2023-06-25 DIAGNOSIS — C7951 Secondary malignant neoplasm of bone: Secondary | ICD-10-CM | POA: Diagnosis present

## 2023-06-25 DIAGNOSIS — Z7951 Long term (current) use of inhaled steroids: Secondary | ICD-10-CM

## 2023-06-25 DIAGNOSIS — C7970 Secondary malignant neoplasm of unspecified adrenal gland: Secondary | ICD-10-CM | POA: Diagnosis present

## 2023-06-25 DIAGNOSIS — Z923 Personal history of irradiation: Secondary | ICD-10-CM | POA: Diagnosis not present

## 2023-06-25 DIAGNOSIS — G893 Neoplasm related pain (acute) (chronic): Secondary | ICD-10-CM | POA: Diagnosis present

## 2023-06-25 DIAGNOSIS — L89150 Pressure ulcer of sacral region, unstageable: Secondary | ICD-10-CM | POA: Diagnosis present

## 2023-06-25 DIAGNOSIS — A419 Sepsis, unspecified organism: Secondary | ICD-10-CM | POA: Diagnosis present

## 2023-06-25 DIAGNOSIS — Z79899 Other long term (current) drug therapy: Secondary | ICD-10-CM

## 2023-06-25 DIAGNOSIS — R197 Diarrhea, unspecified: Secondary | ICD-10-CM | POA: Diagnosis present

## 2023-06-25 DIAGNOSIS — Z791 Long term (current) use of non-steroidal anti-inflammatories (NSAID): Secondary | ICD-10-CM

## 2023-06-25 DIAGNOSIS — R0602 Shortness of breath: Secondary | ICD-10-CM | POA: Diagnosis present

## 2023-06-25 DIAGNOSIS — E876 Hypokalemia: Secondary | ICD-10-CM | POA: Diagnosis present

## 2023-06-25 DIAGNOSIS — F17210 Nicotine dependence, cigarettes, uncomplicated: Secondary | ICD-10-CM | POA: Diagnosis present

## 2023-06-25 DIAGNOSIS — Z79891 Long term (current) use of opiate analgesic: Secondary | ICD-10-CM

## 2023-06-25 DIAGNOSIS — L8990 Pressure ulcer of unspecified site, unspecified stage: Secondary | ICD-10-CM | POA: Insufficient documentation

## 2023-06-25 DIAGNOSIS — Z886 Allergy status to analgesic agent status: Secondary | ICD-10-CM

## 2023-06-25 DIAGNOSIS — Z1152 Encounter for screening for COVID-19: Secondary | ICD-10-CM | POA: Diagnosis not present

## 2023-06-25 DIAGNOSIS — J9601 Acute respiratory failure with hypoxia: Secondary | ICD-10-CM | POA: Diagnosis present

## 2023-06-25 DIAGNOSIS — E871 Hypo-osmolality and hyponatremia: Secondary | ICD-10-CM | POA: Diagnosis present

## 2023-06-25 DIAGNOSIS — Z906 Acquired absence of other parts of urinary tract: Secondary | ICD-10-CM | POA: Diagnosis not present

## 2023-06-25 DIAGNOSIS — Z7189 Other specified counseling: Secondary | ICD-10-CM | POA: Diagnosis not present

## 2023-06-25 DIAGNOSIS — E860 Dehydration: Secondary | ICD-10-CM | POA: Diagnosis present

## 2023-06-25 DIAGNOSIS — D509 Iron deficiency anemia, unspecified: Secondary | ICD-10-CM | POA: Diagnosis present

## 2023-06-25 DIAGNOSIS — J90 Pleural effusion, not elsewhere classified: Secondary | ICD-10-CM | POA: Diagnosis present

## 2023-06-25 DIAGNOSIS — R599 Enlarged lymph nodes, unspecified: Secondary | ICD-10-CM | POA: Diagnosis present

## 2023-06-25 DIAGNOSIS — Z8551 Personal history of malignant neoplasm of bladder: Secondary | ICD-10-CM

## 2023-06-25 DIAGNOSIS — R040 Epistaxis: Secondary | ICD-10-CM | POA: Diagnosis not present

## 2023-06-25 DIAGNOSIS — R52 Pain, unspecified: Secondary | ICD-10-CM | POA: Diagnosis not present

## 2023-06-25 DIAGNOSIS — R6521 Severe sepsis with septic shock: Secondary | ICD-10-CM | POA: Diagnosis present

## 2023-06-25 DIAGNOSIS — C3431 Malignant neoplasm of lower lobe, right bronchus or lung: Secondary | ICD-10-CM | POA: Diagnosis present

## 2023-06-25 DIAGNOSIS — R918 Other nonspecific abnormal finding of lung field: Secondary | ICD-10-CM | POA: Diagnosis not present

## 2023-06-25 DIAGNOSIS — J439 Emphysema, unspecified: Secondary | ICD-10-CM | POA: Diagnosis present

## 2023-06-25 DIAGNOSIS — J449 Chronic obstructive pulmonary disease, unspecified: Secondary | ICD-10-CM | POA: Diagnosis not present

## 2023-06-25 DIAGNOSIS — Z981 Arthrodesis status: Secondary | ICD-10-CM | POA: Diagnosis not present

## 2023-06-25 DIAGNOSIS — C3490 Malignant neoplasm of unspecified part of unspecified bronchus or lung: Secondary | ICD-10-CM | POA: Diagnosis present

## 2023-06-25 LAB — CBC WITH DIFFERENTIAL/PLATELET
Abs Immature Granulocytes: 0.34 10*3/uL — ABNORMAL HIGH (ref 0.00–0.07)
Basophils Absolute: 0.1 10*3/uL (ref 0.0–0.1)
Basophils Relative: 0 %
Eosinophils Absolute: 0 10*3/uL (ref 0.0–0.5)
Eosinophils Relative: 0 %
HCT: 30.7 % — ABNORMAL LOW (ref 36.0–46.0)
Hemoglobin: 8.3 g/dL — ABNORMAL LOW (ref 12.0–15.0)
Immature Granulocytes: 1 %
Lymphocytes Relative: 1 %
Lymphs Abs: 0.5 10*3/uL — ABNORMAL LOW (ref 0.7–4.0)
MCH: 22.3 pg — ABNORMAL LOW (ref 26.0–34.0)
MCHC: 27 g/dL — ABNORMAL LOW (ref 30.0–36.0)
MCV: 82.5 fL (ref 80.0–100.0)
Monocytes Absolute: 0.8 10*3/uL (ref 0.1–1.0)
Monocytes Relative: 2 %
Neutro Abs: 38.5 10*3/uL — ABNORMAL HIGH (ref 1.7–7.7)
Neutrophils Relative %: 96 %
Platelets: 348 10*3/uL (ref 150–400)
RBC: 3.72 MIL/uL — ABNORMAL LOW (ref 3.87–5.11)
RDW: 22.5 % — ABNORMAL HIGH (ref 11.5–15.5)
WBC: 40.2 10*3/uL — ABNORMAL HIGH (ref 4.0–10.5)
nRBC: 0 % (ref 0.0–0.2)

## 2023-06-25 LAB — COMPREHENSIVE METABOLIC PANEL
ALT: 21 U/L (ref 0–44)
AST: 23 U/L (ref 15–41)
Albumin: 2.1 g/dL — ABNORMAL LOW (ref 3.5–5.0)
Alkaline Phosphatase: 85 U/L (ref 38–126)
Anion gap: 14 (ref 5–15)
BUN: 49 mg/dL — ABNORMAL HIGH (ref 8–23)
CO2: 20 mmol/L — ABNORMAL LOW (ref 22–32)
Calcium: 8.3 mg/dL — ABNORMAL LOW (ref 8.9–10.3)
Chloride: 97 mmol/L — ABNORMAL LOW (ref 98–111)
Creatinine, Ser: 1.04 mg/dL — ABNORMAL HIGH (ref 0.44–1.00)
GFR, Estimated: >60 mL/min (ref >60)
Glucose, Bld: 243 mg/dL — ABNORMAL HIGH (ref 70–99)
Potassium: 4.1 mmol/L (ref 3.5–5.1)
Sodium: 131 mmol/L — ABNORMAL LOW (ref 135–145)
Total Bilirubin: 0.5 mg/dL (ref 0.0–1.2)
Total Protein: 6.3 g/dL — ABNORMAL LOW (ref 6.5–8.1)

## 2023-06-25 LAB — IRON AND TIBC
Iron: 9 ug/dL — ABNORMAL LOW (ref 28–170)
Saturation Ratios: 4 % — ABNORMAL LOW (ref 10.4–31.8)
TIBC: 228 ug/dL — ABNORMAL LOW (ref 250–450)
UIBC: 219 ug/dL

## 2023-06-25 LAB — RESP PANEL BY RT-PCR (RSV, FLU A&B, COVID)  RVPGX2
Influenza A by PCR: NEGATIVE
Influenza B by PCR: NEGATIVE
Resp Syncytial Virus by PCR: NEGATIVE
SARS Coronavirus 2 by RT PCR: NEGATIVE

## 2023-06-25 LAB — HIV ANTIBODY (ROUTINE TESTING W REFLEX): HIV Screen 4th Generation wRfx: NONREACTIVE

## 2023-06-25 LAB — I-STAT CG4 LACTIC ACID, ED: Lactic Acid, Venous: 4.1 mmol/L (ref 0.5–1.9)

## 2023-06-25 LAB — RETICULOCYTES
Immature Retic Fract: 28.5 % — ABNORMAL HIGH (ref 2.3–15.9)
RBC.: 2.92 MIL/uL — ABNORMAL LOW (ref 3.87–5.11)
Retic Count, Absolute: 103.4 10*3/uL (ref 19.0–186.0)
Retic Ct Pct: 3.5 % — ABNORMAL HIGH (ref 0.4–3.1)

## 2023-06-25 LAB — LACTIC ACID, PLASMA
Lactic Acid, Venous: 5.9 mmol/L (ref 0.5–1.9)
Lactic Acid, Venous: 5.6 mmol/L (ref 0.5–1.9)

## 2023-06-25 LAB — FERRITIN: Ferritin: 116 ng/mL (ref 11–307)

## 2023-06-25 LAB — FOLATE: Folate: 9.8 ng/mL (ref >5.9)

## 2023-06-25 LAB — VITAMIN B12: Vitamin B-12: 286 pg/mL (ref 180–914)

## 2023-06-25 MED ORDER — HYDROCODONE-ACETAMINOPHEN 5-325 MG PO TABS
1.0000 | ORAL_TABLET | Freq: Four times a day (QID) | ORAL | Status: DC | PRN
  Administered 2023-06-25 – 2023-06-26 (×4): 1 via ORAL
  Filled 2023-06-25 (×4): qty 1

## 2023-06-25 MED ORDER — HYDROMORPHONE HCL 1 MG/ML IJ SOLN
1.0000 mg | Freq: Once | INTRAMUSCULAR | Status: AC
  Administered 2023-06-25: 1 mg via INTRAVENOUS
  Filled 2023-06-25: qty 1

## 2023-06-25 MED ORDER — ENOXAPARIN SODIUM 40 MG/0.4ML IJ SOSY
40.0000 mg | PREFILLED_SYRINGE | INTRAMUSCULAR | Status: DC
  Administered 2023-06-25 – 2023-07-09 (×15): 40 mg via SUBCUTANEOUS
  Filled 2023-06-25 (×15): qty 0.4

## 2023-06-25 MED ORDER — SODIUM CHLORIDE 0.9 % IV SOLN
500.0000 mg | Freq: Once | INTRAVENOUS | Status: AC
  Administered 2023-06-25: 500 mg via INTRAVENOUS
  Filled 2023-06-25: qty 5

## 2023-06-25 MED ORDER — LACTATED RINGERS IV SOLN: INTRAVENOUS | Status: AC

## 2023-06-25 MED ORDER — ONDANSETRON HCL 4 MG PO TABS: 4.0000 mg | ORAL_TABLET | Freq: Four times a day (QID) | ORAL | Status: DC | PRN

## 2023-06-25 MED ORDER — FOLIC ACID 1 MG PO TABS
1.0000 mg | ORAL_TABLET | Freq: Every day | ORAL | Status: DC
  Administered 2023-06-25 – 2023-07-10 (×16): 1 mg via ORAL
  Filled 2023-06-25 (×16): qty 1

## 2023-06-25 MED ORDER — ALBUTEROL SULFATE (2.5 MG/3ML) 0.083% IN NEBU
2.5000 mg | INHALATION_SOLUTION | RESPIRATORY_TRACT | Status: DC | PRN
  Administered 2023-06-27: 2.5 mg via RESPIRATORY_TRACT
  Filled 2023-06-25 (×2): qty 3

## 2023-06-25 MED ORDER — LACTATED RINGERS IV BOLUS (SEPSIS)
1000.0000 mL | Freq: Once | INTRAVENOUS | Status: AC
  Administered 2023-06-25: 1000 mL via INTRAVENOUS

## 2023-06-25 MED ORDER — SODIUM CHLORIDE 0.9 % IV BOLUS
1000.0000 mL | Freq: Once | INTRAVENOUS | Status: AC
  Administered 2023-06-25: 1000 mL via INTRAVENOUS

## 2023-06-25 MED ORDER — VANCOMYCIN HCL IN DEXTROSE 1-5 GM/200ML-% IV SOLN
1000.0000 mg | Freq: Once | INTRAVENOUS | Status: AC
  Administered 2023-06-25: 1000 mg via INTRAVENOUS
  Filled 2023-06-25: qty 200

## 2023-06-25 MED ORDER — ACETAMINOPHEN 650 MG RE SUPP: 650.0000 mg | Freq: Four times a day (QID) | RECTAL | Status: DC | PRN

## 2023-06-25 MED ORDER — SODIUM CHLORIDE 0.9 % IV SOLN
1.0000 g | Freq: Every day | INTRAVENOUS | Status: DC
  Administered 2023-06-26 – 2023-06-27 (×2): 1 g via INTRAVENOUS
  Filled 2023-06-25 (×2): qty 10

## 2023-06-25 MED ORDER — SODIUM CHLORIDE 0.9 % IV BOLUS
1500.0000 mL | Freq: Once | INTRAVENOUS | Status: AC
  Administered 2023-06-25: 1500 mL via INTRAVENOUS

## 2023-06-25 MED ORDER — LACTATED RINGERS IV BOLUS (SEPSIS)
500.0000 mL | Freq: Once | INTRAVENOUS | Status: AC
  Administered 2023-06-25: 500 mL via INTRAVENOUS

## 2023-06-25 MED ORDER — BUDESON-GLYCOPYRROL-FORMOTEROL 160-9-4.8 MCG/ACT IN AERO: 2.0000 | INHALATION_SPRAY | Freq: Every day | RESPIRATORY_TRACT | Status: DC

## 2023-06-25 MED ORDER — TRAZODONE HCL 50 MG PO TABS
50.0000 mg | ORAL_TABLET | Freq: Every evening | ORAL | Status: DC | PRN
  Administered 2023-06-27 – 2023-06-28 (×2): 50 mg via ORAL
  Filled 2023-06-25 (×3): qty 1

## 2023-06-25 MED ORDER — ACETAMINOPHEN 325 MG PO TABS: 650.0000 mg | ORAL_TABLET | Freq: Four times a day (QID) | ORAL | Status: DC | PRN

## 2023-06-25 MED ORDER — SODIUM CHLORIDE 0.9 % IV SOLN
2.0000 g | Freq: Once | INTRAVENOUS | Status: AC
  Administered 2023-06-25: 2 g via INTRAVENOUS
  Filled 2023-06-25: qty 20

## 2023-06-25 MED ORDER — NICOTINE 21 MG/24HR TD PT24
21.0000 mg | MEDICATED_PATCH | Freq: Every day | TRANSDERMAL | Status: DC
  Administered 2023-06-25 – 2023-07-10 (×15): 21 mg via TRANSDERMAL
  Filled 2023-06-25 (×16): qty 1

## 2023-06-25 MED ORDER — SODIUM CHLORIDE 0.9 % IV SOLN
500.0000 mg | Freq: Every day | INTRAVENOUS | Status: AC
  Administered 2023-06-26 – 2023-06-29 (×4): 500 mg via INTRAVENOUS
  Filled 2023-06-25 (×4): qty 5

## 2023-06-25 MED ORDER — ONDANSETRON HCL 4 MG/2ML IJ SOLN: 4.0000 mg | Freq: Four times a day (QID) | INTRAMUSCULAR | Status: DC | PRN

## 2023-06-25 MED ORDER — BENZONATATE 100 MG PO CAPS
200.0000 mg | ORAL_CAPSULE | Freq: Three times a day (TID) | ORAL | Status: DC | PRN
  Administered 2023-06-26 – 2023-06-27 (×3): 200 mg via ORAL
  Filled 2023-06-25 (×3): qty 2

## 2023-06-25 NOTE — H&P (Signed)
 History and Physical  Lauren Salazar:[id number] DOB: 04/17/1961 DOA: 06/25/2023  PCP: [facility]   Chief Complaint: Cough, shortness of breath  HPI: Lauren Salazar is a 63 y.o. 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  She is under the care of Dr. Leonides Schanz, underwent treatment with docetaxel and ramucirumab but starting 01/31/2023 her therapy has been held due to severe congestion and coughing.  She has had several rounds of antibiotics in the last few months due to recurrent infections.  Most recently, she was seen by palliative care 2/24 remotely, they prescribed her p.o. azithromycin and steroids.  States that she had a little bit of improvement in her cough, but over the last couple of days started to become more progressively short of breath.  She denies any chest pain, fevers, nausea or vomiting.  She continues to have difficult to control abdominal and right hip pain, where she had prior radiation.  She was unable to tolerate MS Contin due to somnolence and hallucinations.  On evaluation in the emergency department, as detailed below, she was found to have evidence of septic shock and bilateral community-acquired pneumonia.  She was started on empiric IV antibiotics, and is currently stable on 3 L nasal cannula oxygen.  Review of Systems: Please see HPI for pertinent positives and negatives. A complete 10 system review of systems are otherwise negative.  Past Medical History:  Diagnosis Date   Bladder tumor    Cancer (HCC)    Complication of anesthesia    slow to wake after spinal fusion, then was "looney" for the following 24 hours    Persistent dry cough     ongoing since feb 2020   Past Surgical History:  Procedure Laterality Date   CERVICAL FUSION  2005   c5-c6      CYSTOSCOPY WITH INJECTION N/A 02/01/2019   Procedure: CYSTOSCOPY WITH INJECTION;  Surgeon: Sebastian Ache, MD;  Location: [facility];  Service: Urology;  Laterality: N/A;   IR IMAGING GUIDED PORT INSERTION  07/25/2022   LYMPHADENECTOMY Bilateral 02/01/2019   Procedure: LYMPHADENECTOMY;  Surgeon: Sebastian Ache, MD;  Location: [facility];  Service: Urology;  Laterality: Bilateral;   ROBOT ASSISTED LAPAROSCOPIC COMPLETE CYSTECT ILEAL CONDUIT N/A 02/01/2019   Procedure: XI ROBOTIC ASSISTED LAPAROSCOPIC COMPLETE CYSTECTOMY, ILEAL CONDUIT, HYSTERECTOMY WITH BILATERAL SALPINGOOPHORECTOMY;  Surgeon: Sebastian Ache, MD;  Location: [facility];  Service: Urology;  Laterality: N/A;  6 HRS   TRANSURETHRAL RESECTION OF BLADDER TUMOR N/A 10/31/2018   Procedure: TRANSURETHRAL RESECTION OF BLADDER TUMOR (TURBT) WITH CYSTOSCOPY/ POSSIBLE INTRAVESICAL GEMCITABINE;  Surgeon: Rene Paci, MD;  Location: [facility];  Service: Urology;  Laterality: N/A;   Social History:  reports that she has been smoking cigarettes. She has never used smokeless tobacco. She reports that she does not drink alcohol and does not use drugs.  Allergies  Allergen Reactions   Celecoxib Palpitations and Other (See Comments)    Chest pain    History reviewed. No pertinent family history.   Prior to Admission medications   Medication Sig Start Date End Date Taking? Authorizing Provider  acetaminophen (TYLENOL) 500 MG tablet Take 500 mg by mouth every 6 (six) hours as needed for mild pain (pain score 1-3) or  moderate pain (pain score 4-6).   Yes [provider]  albuterol (VENTOLIN HFA) 108 (90 Base) MCG/ACT inhaler Inhale 1-2 puffs into the lungs every 4 (four) hours as needed for wheezing or shortness of breath. 03/10/23  Yes [provider]  azithromycin (ZITHROMAX Z-PAK) 250 MG tablet Take as  directed Patient taking differently: Take 250 mg by mouth See admin instructions. Take as directed 06/19/23  Yes Pickenpack-Cousar, Arty Baumgartner, NP  benzonatate (TESSALON) 100 MG capsule Take 2 capsules (200 mg total) by mouth 3 (three) times daily as needed for cough. 04/25/23  Yes Jaci Standard, MD  Budeson-Glycopyrrol-Formoterol (BREZTRI AEROSPHERE) 160-9-4.8 MCG/ACT AERO Inhale 2 puffs into the lungs daily. 04/06/23  Yes [provider]  dexamethasone (DECADRON) 4 MG tablet Take 1 tablet twice daily for 7 days then take 1 tablet daily for 7 days. 06/19/23  Yes Pickenpack-Cousar, Arty Baumgartner, NP  folic acid (FOLVITE) 1 MG tablet TAKE 1 TABLET(1 MG) BY MOUTH DAILY Patient taking differently: Take 1 mg by mouth daily. TAKE 1 TABLET(1 MG) BY MOUTH DAILY 08/11/22  Yes Briant Cedar, PA-C  HYDROcodone-acetaminophen (NORCO/VICODIN) 5-325 MG tablet Take 1 tablet by mouth every 6 (six) hours as needed for moderate pain (pain score 4-6) or severe pain (pain score 7-10) (breakthrough pain in between use of morphine extended release). 06/13/23  Yes Pickenpack-Cousar, Athena N, NP  lidocaine (LIDODERM) 5 % Place 1 patch onto the skin daily. Remove & Discard patch within 12 hours or as directed by MD 12/20/22  Yes Pickenpack-Cousar, Arty Baumgartner, NP  magic mouthwash (nystatin, diphenhydrAMINE, alum & mag hydroxide) suspension mixture Swish and swallow 10 mLs 3 (three) times daily. 06/23/23  Yes Pickenpack-Cousar, Arty Baumgartner, NP  meloxicam (MOBIC) 15 MG tablet Take 1 tablet (15 mg total) by mouth daily. 06/19/23  Yes Pickenpack-Cousar, Arty Baumgartner, NP  morphine (MS CONTIN) 15 MG 12 hr tablet Take 1 tablet (15 mg total) by mouth every 12 (twelve) hours. 06/12/23  Yes Pickenpack-Cousar, Arty Baumgartner, NP  nicotine (NICODERM CQ - DOSED IN MG/24 HOURS) 21 mg/24hr patch Place 1 patch (21 mg total) onto the skin daily. 12/20/22  Yes Pickenpack-Cousar, Arty Baumgartner, NP  senna-docusate (SENNA S) 8.6-50 MG tablet Take 2 tablets by mouth at  bedtime. 06/12/23  Yes Pickenpack-Cousar, Arty Baumgartner, NP  prochlorperazine (COMPAZINE) 10 MG tablet Take 1 tablet (10 mg total) by mouth every 6 (six) hours as needed for nausea or vomiting. Patient not taking: Reported on 06/25/2023 07/19/22   Glee Arvin, NP    Physical Exam: BP 110/69   Pulse (!) 117   Temp 98.3 F (36.8 C) (Rectal)   Resp (!) 22   SpO2 97%  General: Patient is awake, alert, oriented, looks chronically ill but not in any acute distress.  She is currently wearing 3 L nasal cannula oxygen. Cardiovascular: RRR, no murmurs or rubs, no peripheral edema  Respiratory: Breath sounds are distant but clear bilaterally, with some bilateral basilar crackles.  No active wheezing, tachypnea, or stridor. Abdomen: soft, nontender, nondistended, normal bowel tones heard, right lower quadrant ileostomy in place Skin: dry, no rashes  Musculoskeletal: no joint effusions, normal range of motion  Psychiatric: appropriate affect, normal speech  Neurologic: extraocular muscles intact, clear speech, moving all extremities with intact sensorium         Labs on Admission:  Basic Metabolic Panel: Recent Labs  Lab 06/25/23 1024  NA 131*  K 4.1  CL 97*  CO2 20*  GLUCOSE 243*  BUN 49*  CREATININE 1.04*  CALCIUM 8.3*   Liver Function Tests: Recent Labs  Lab 06/25/23 1024  AST 23  ALT 21  ALKPHOS 85  BILITOT 0.5  PROT 6.3*  ALBUMIN 2.1*   No results for input(s): "LIPASE", "AMYLASE" in the last 168 hours. No results for input(s): "AMMONIA" in the last 168 hours. CBC: Recent Labs  Lab 06/25/23 1024  WBC 40.2*  NEUTROABS 38.5*  HGB 8.3*  HCT 30.7*  MCV 82.5  PLT 348   Cardiac Enzymes: No results for input(s): "CKTOTAL", "CKMB", "CKMBINDEX", "TROPONINI" in the last 168 hours. BNP (last 3 results) No results for input(s): "BNP" in the last 8760 hours.  ProBNP (last 3 results) No results for input(s): "PROBNP" in the last 8760 hours.  CBG: No results for  input(s): "GLUCAP" in the last 168 hours.  Radiological Exams on Admission: DG Chest Portable 1 View Result Date: 06/25/2023 CLINICAL DATA:  Shortness of breath EXAM: PORTABLE CHEST 1 VIEW COMPARISON:  09/17/2019 FINDINGS: Porta catheter on the right with tip at the lower SVC. Bilateral consolidation superimposed on chronic opacity at the right base where there is a chronic mass and pleural fluid by most recent CT. No pneumothorax. Normal heart size. IMPRESSION: Bilateral pneumonia with underlying malignancy in the right lower chest. Electronically Signed   By: Tiburcio Pea M.D.   On: 06/25/2023 10:16   Assessment/Plan KABELLA CASSIDY is a 63 y.o. 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   Septic shock-meeting criteria with tachycardia, leukocytosis, lactate 4.1, source is bilateral community-acquired pneumonia. -Inpatient admission to progressive -Treat pneumonia as below -Continue to fluid resuscitate and trend lactate  Community-acquired pneumonia-with increased cough, bilateral new consolidations, leukocytosis, and hypoxia. -Empiric IV azithromycin and IV Rocephin  Acute hypoxic respiratory failure-due to community-acquired pneumonia as above  Chronic anemia-this has been gradually worsening over the last year or so, denies any history of bleeding.  Suspect this is related to anemia of chronic disease. -Check anemia panel  DVT prophylaxis: Lovenox     Code Status: Full Code  Consults called: Dr. Leonides Schanz added to her treatment team  Admission status: The appropriate patient status for this patient is INPATIENT. Inpatient status is judged to be reasonable and necessary in order to provide  the required intensity of service to ensure the patient's safety. The patient's presenting symptoms, physical exam findings, and initial radiographic and laboratory data in the context of their chronic comorbidities is felt to place them at high risk for further clinical deterioration. Furthermore, it is not anticipated that the patient will be medically stable for discharge from the hospital within 2 midnights of admission.    I certify that at the point of admission it is my clinical judgment that the patient will require inpatient hospital care spanning beyond 2 midnights from the point of admission due to high intensity of service, high risk for further deterioration and high frequency of surveillance required  Time spent: 59 minutes  Momina Hunton Sharlette Dense MD [facility] Pager [phone]  If 7PM-7AM, please contact night-coverage [email or web address] Password [email or web address]  06/25/2023, 1:10 PM

## 2023-06-25 NOTE — Progress Notes (Signed)
 ED Pharmacy Antibiotic Sign Off An antibiotic consult was received from an ED provider for Vanco per pharmacy dosing for PNA. A chart review was completed to assess appropriateness.   The following one time order(s) were placed:  Vanco 1g IV x 1   Further antibiotic and/or antibiotic pharmacy consults should be ordered by the admitting provider if indicated.   Thank you for allowing pharmacy to be a part of this patient's care.   Misty Stanley Cheney, [facility]  Clinical Pharmacist 06/25/23 10:49 AM

## 2023-06-25 NOTE — ED Triage Notes (Addendum)
 Pt BIB EMS from Home due to SOB. Pt called EMS. Hx of Asthma, COPD, Lung cancer, bladder and pelvic cancer, Pneumonia in Nov. 2024; pt is on 5 row of Antibiotics. Pt does not use O2 oxygen at home.  In Route 10mg  Albuterol 0.5 mg Atrovent 2g Magnesium 125 mg Solu-Medrol 20ga IV LAC BP 129/62 HR 127 SpO2 97% O2 w/ neb treatment RR 37 Pt SpO2 93% RA

## 2023-06-25 NOTE — ED Provider Notes (Addendum)
 [place] EMERGENCY DEPARTMENT AT [facility] Provider Note   CSN: [id number] Arrival date & time: 06/25/23  6578     History  Chief Complaint  Patient presents with   Shortness of Breath    Lauren Salazar is a 63 y.o. female.   Shortness of Breath Patient with history of metastatic lung cancer.  Shortness of breath.  No longer chemotherapy due to not being able to tolerate it.  More short of breath over the last few months.  Has been on antibiotics for pneumonia.  States diagnosed by increasing sputum production.  Now more short of breath.  Not on oxygen at home.  States she can hardly get up to walk to the bathroom at this time.     Home Medications Prior to Admission medications   Medication Sig Start Date End Date Taking? Authorizing Provider  acetaminophen (TYLENOL) 500 MG tablet 1 tablet as needed Orally every 6 hrs    [provider]  albuterol (VENTOLIN HFA) 108 (90 Base) MCG/ACT inhaler 1-2 puffs as needed Inhalation every 4 hrs 03/10/23   [provider]  azithromycin (ZITHROMAX Z-PAK) 250 MG tablet Take as directed 06/19/23   Pickenpack-Cousar, Arty Baumgartner, NP  benzonatate (TESSALON) 100 MG capsule Take 2 capsules (200 mg total) by mouth 3 (three) times daily as needed for cough. 04/25/23   Jaci Standard, MD  Budeson-Glycopyrrol-Formoterol (BREZTRI AEROSPHERE) 160-9-4.8 MCG/ACT AERO 2 puffs Inhalation Twice a day for 14 days 04/06/23   [provider]  dexamethasone (DECADRON) 4 MG tablet Take 1 tablet twice daily for 7 days then take 1 tablet daily for 7 days. 06/19/23   Pickenpack-Cousar, Arty Baumgartner, NP  diphenoxylate-atropine ([name]) 2.5-0.025 MG tablet Take 1 tablet by mouth 4 (four) times daily as needed for diarrhea or loose stools. 07/20/22   Shanon Ace, PA-C  folic acid (FOLVITE) 1 MG tablet TAKE 1 TABLET(1 MG) BY MOUTH DAILY 08/11/22   Briant Cedar, PA-C  HYDROcodone-acetaminophen (NORCO/VICODIN) 5-325 MG  tablet Take 1 tablet by mouth every 6 (six) hours as needed for moderate pain (pain score 4-6) or severe pain (pain score 7-10) (breakthrough pain in between use of morphine extended release). 06/13/23   Pickenpack-Cousar, Arty Baumgartner, NP  lidocaine (LIDODERM) 5 % Place 1 patch onto the skin daily. Remove & Discard patch within 12 hours or as directed by MD 12/20/22   Pickenpack-Cousar, Arty Baumgartner, NP  lidocaine-prilocaine (EMLA) cream Apply 1 Application topically as needed. 12/20/22   Pickenpack-Cousar, Arty Baumgartner, NP  magic mouthwash (nystatin, diphenhydrAMINE, alum & mag hydroxide) suspension mixture Swish and swallow 10 mLs 3 (three) times daily. 06/23/23   Pickenpack-Cousar, Arty Baumgartner, NP  meloxicam (MOBIC) 15 MG tablet Take 1 tablet (15 mg total) by mouth daily. 06/19/23   Pickenpack-Cousar, Arty Baumgartner, NP  morphine (MS CONTIN) 15 MG 12 hr tablet Take 1 tablet (15 mg total) by mouth every 12 (twelve) hours. 06/12/23   Pickenpack-Cousar, Arty Baumgartner, NP  nicotine (NICODERM CQ - DOSED IN MG/24 HOURS) 21 mg/24hr patch Place 1 patch (21 mg total) onto the skin daily. 12/20/22   Pickenpack-Cousar, Arty Baumgartner, NP  prochlorperazine (COMPAZINE) 10 MG tablet Take 1 tablet (10 mg total) by mouth every 6 (six) hours as needed for nausea or vomiting. 07/19/22   Pickenpack-Cousar, Arty Baumgartner, NP  senna-docusate (SENNA S) 8.6-50 MG tablet Take 2 tablets by mouth at bedtime. 06/12/23   Pickenpack-Cousar, Arty Baumgartner, NP  SYMBICORT 80-4.5 MCG/ACT inhaler Inhale into the  lungs.    [provider]      Allergies    Celecoxib    Review of Systems   Review of Systems  Respiratory:  Positive for shortness of breath.     Physical Exam Updated Vital Signs BP 110/69   Pulse (!) 117   Temp 98.3 F (36.8 C) (Rectal)   Resp (!) 22   SpO2 97%  Physical Exam Vitals and nursing note reviewed.  Cardiovascular:     Rate and Rhythm: Tachycardia present.  Pulmonary:     Comments: Harsh breath sounds with tachypnea and some pursed  lip breathing. Musculoskeletal:     Right lower leg: No edema.     Left lower leg: No edema.  Skin:    Capillary Refill: Capillary refill takes less than 2 seconds.  Neurological:     Mental Status: She is alert.     ED Results / Procedures / Treatments   Labs (all labs ordered are listed, but only abnormal results are displayed) Labs Reviewed  COMPREHENSIVE METABOLIC PANEL - Abnormal; Notable for the following components:      Result Value   Sodium 131 (*)    Chloride 97 (*)    CO2 20 (*)    Glucose, Bld 243 (*)    BUN 49 (*)    Creatinine, Ser 1.04 (*)    Calcium 8.3 (*)    Total Protein 6.3 (*)    Albumin 2.1 (*)    All other components within normal limits  CBC WITH DIFFERENTIAL/PLATELET - Abnormal; Notable for the following components:   WBC 40.2 (*)    RBC 3.72 (*)    Hemoglobin 8.3 (*)    HCT 30.7 (*)    MCH 22.3 (*)    MCHC 27.0 (*)    RDW 22.5 (*)    All other components within normal limits  I-STAT CG4 LACTIC ACID, ED - Abnormal; Notable for the following components:   Lactic Acid, Venous 4.1 (*)    All other components within normal limits  RESP PANEL BY RT-PCR (RSV, FLU A&B, COVID)  RVPGX2  CULTURE, BLOOD (ROUTINE X 2)  CULTURE, BLOOD (ROUTINE X 2)  I-STAT CG4 LACTIC ACID, ED    EKG EKG Interpretation Date/Time:  Sunday June 25 2023 09:01:58 EST Ventricular Rate:  130 PR Interval:    QRS Duration:  70 QT Interval:  287 QTC Calculation: 422 R Axis:   61  Text Interpretation: Sinus tachycardia Minimal ST depression Confirmed by Benjiman Core [phone]) on 06/25/2023 9:25:22 AM  Radiology DG Chest Portable 1 View Result Date: 06/25/2023 CLINICAL DATA:  Shortness of breath EXAM: PORTABLE CHEST 1 VIEW COMPARISON:  09/17/2019 FINDINGS: Porta catheter on the right with tip at the lower SVC. Bilateral consolidation superimposed on chronic opacity at the right base where there is a chronic mass and pleural fluid by most recent CT. No pneumothorax. Normal  heart size. IMPRESSION: Bilateral pneumonia with underlying malignancy in the right lower chest. Electronically Signed   By: Tiburcio Pea M.D.   On: 06/25/2023 10:16    Procedures Procedures    Medications Ordered in ED Medications  cefTRIAXone (ROCEPHIN) 2 g in sodium chloride 0.9 % 100 mL IVPB (has no administration in time range)  azithromycin (ZITHROMAX) 500 mg in sodium chloride 0.9 % 250 mL IVPB (has no administration in time range)  vancomycin (VANCOCIN) IVPB 1000 mg/200 mL premix (has no administration in time range)  lactated ringers infusion (has no administration in time range)  lactated ringers bolus 1,000 mL (has no administration in time range)    And  lactated ringers bolus 500 mL (has no administration in time range)  HYDROmorphone (DILAUDID) injection 1 mg (1 mg Intravenous Given 06/25/23 1040)  sodium chloride 0.9 % bolus 1,000 mL (1,000 mLs Intravenous New Bag/Given 06/25/23 1029)    ED Course/ Medical Decision Making/ A&P                                 Medical Decision Making Amount and/or Complexity of Data Reviewed Labs: ordered. Radiology: ordered.  Risk Prescription drug management. Decision regarding hospitalization.   Patient shortness of breath and cough.  Potential could be due to infection but also other causes such as pulmonary embolism or worsening cancer.  Will get x-ray basic blood work.  Will give fluid bolus.  Will also get CT scan to evaluate for causes such as infection or pulmonary embolism.  I have reviewed recent oncology note  X-ray shows bilateral pneumonia.  At this point will not get CT scan with visible infection.  White count is elevated and now lactic acid is become elevated.  With lactate greater than 4 will give 30/kg of fluid.  Antibiotics already been ordered.  Had discussed with ED pharmacist and will give ceftriaxone azithromycin and vancomycin.  White count is elevated.  However could be due to the steroids she has been on  or the infection.  Will discuss with hospitalist for admission.  CRITICAL CARE Performed by: Benjiman Core Total critical care time: 30 minutes Critical care time was exclusive of separately billable procedures and treating other patients. Critical care was necessary to treat or prevent imminent or life-threatening deterioration. Critical care was time spent personally by me on the following activities: development of treatment plan with patient and/or surrogate as well as nursing, discussions with consultants, evaluation of patient's response to treatment, examination of patient, obtaining history from patient or surrogate, ordering and performing treatments and interventions, ordering and review of laboratory studies, ordering and review of radiographic studies, pulse oximetry and re-evaluation of patient's condition.           Final Clinical Impression(s) / ED Diagnoses Final diagnoses:  Pneumonia of both lungs due to infectious organism, unspecified part of lung    Rx / DC Orders ED Discharge Orders     None         Benjiman Core, MD 06/25/23 1152    Benjiman Core, MD 06/25/23 1158

## 2023-06-25 NOTE — Sepsis Progress Note (Addendum)
 Elink following code sepsis  1600 message sent to bedside RN to clarify if 3rd lactic was drawn and sent

## 2023-06-26 DIAGNOSIS — A419 Sepsis, unspecified organism: Secondary | ICD-10-CM | POA: Diagnosis not present

## 2023-06-26 DIAGNOSIS — D509 Iron deficiency anemia, unspecified: Secondary | ICD-10-CM | POA: Insufficient documentation

## 2023-06-26 DIAGNOSIS — J9601 Acute respiratory failure with hypoxia: Secondary | ICD-10-CM | POA: Insufficient documentation

## 2023-06-26 DIAGNOSIS — L8990 Pressure ulcer of unspecified site, unspecified stage: Secondary | ICD-10-CM | POA: Insufficient documentation

## 2023-06-26 DIAGNOSIS — R6521 Severe sepsis with septic shock: Secondary | ICD-10-CM | POA: Diagnosis not present

## 2023-06-26 LAB — BASIC METABOLIC PANEL
Anion gap: 9 (ref 5–15)
BUN: 35 mg/dL — ABNORMAL HIGH (ref 8–23)
CO2: 21 mmol/L — ABNORMAL LOW (ref 22–32)
Calcium: 7.9 mg/dL — ABNORMAL LOW (ref 8.9–10.3)
Chloride: 106 mmol/L (ref 98–111)
Creatinine, Ser: 0.93 mg/dL (ref 0.44–1.00)
GFR, Estimated: >60 mL/min (ref >60)
Glucose, Bld: 126 mg/dL — ABNORMAL HIGH (ref 70–99)
Potassium: 4.4 mmol/L (ref 3.5–5.1)
Sodium: 136 mmol/L (ref 135–145)

## 2023-06-26 LAB — CBC
HCT: 24.1 % — ABNORMAL LOW (ref 36.0–46.0)
Hemoglobin: 6.4 g/dL — CL (ref 12.0–15.0)
MCH: 22.5 pg — ABNORMAL LOW (ref 26.0–34.0)
MCHC: 26.6 g/dL — ABNORMAL LOW (ref 30.0–36.0)
MCV: 84.9 fL (ref 80.0–100.0)
Platelets: 207 10*3/uL (ref 150–400)
RBC: 2.84 MIL/uL — ABNORMAL LOW (ref 3.87–5.11)
RDW: 22.5 % — ABNORMAL HIGH (ref 11.5–15.5)
WBC: 21.8 10*3/uL — ABNORMAL HIGH (ref 4.0–10.5)
nRBC: 0 % (ref 0.0–0.2)

## 2023-06-26 LAB — HEMOGLOBIN AND HEMATOCRIT, BLOOD
HCT: 27 % — ABNORMAL LOW (ref 36.0–46.0)
Hemoglobin: 8 g/dL — ABNORMAL LOW (ref 12.0–15.0)

## 2023-06-26 LAB — LACTIC ACID, PLASMA: Lactic Acid, Venous: 1.6 mmol/L (ref 0.5–1.9)

## 2023-06-26 LAB — PREPARE RBC (CROSSMATCH)

## 2023-06-26 LAB — MRSA NEXT GEN BY PCR, NASAL: MRSA by PCR Next Gen: NOT DETECTED

## 2023-06-26 LAB — ABO/RH: ABO/RH(D): A POS

## 2023-06-26 MED ORDER — MORPHINE SULFATE (PF) 2 MG/ML IV SOLN
1.0000 mg | Freq: Once | INTRAVENOUS | Status: DC | PRN
  Filled 2023-06-26: qty 1

## 2023-06-26 MED ORDER — UMECLIDINIUM BROMIDE 62.5 MCG/ACT IN AEPB
1.0000 | INHALATION_SPRAY | Freq: Every day | RESPIRATORY_TRACT | Status: DC
  Administered 2023-06-26 – 2023-06-27 (×2): 1 via RESPIRATORY_TRACT
  Filled 2023-06-26: qty 7

## 2023-06-26 MED ORDER — FLUTICASONE FUROATE-VILANTEROL 100-25 MCG/ACT IN AEPB
1.0000 | INHALATION_SPRAY | Freq: Every day | RESPIRATORY_TRACT | Status: DC
  Administered 2023-06-26: 1 via RESPIRATORY_TRACT
  Filled 2023-06-26: qty 28

## 2023-06-26 MED ORDER — CHLORHEXIDINE GLUCONATE CLOTH 2 % EX PADS
6.0000 | MEDICATED_PAD | Freq: Every day | CUTANEOUS | Status: DC
  Administered 2023-06-26 – 2023-07-10 (×15): 6 via TOPICAL

## 2023-06-26 MED ORDER — SODIUM CHLORIDE 0.9% FLUSH: 10.0000 mL | Freq: Two times a day (BID) | INTRAVENOUS | Status: DC

## 2023-06-26 MED ORDER — MEDIHONEY WOUND/BURN DRESSING EX PSTE
1.0000 | PASTE | Freq: Every day | CUTANEOUS | Status: DC
Start: 2023-06-26 — End: 2023-07-10
  Administered 2023-06-26 – 2023-07-10 (×15): 1 via TOPICAL
  Filled 2023-06-26 (×2): qty 44

## 2023-06-26 MED ORDER — POLYETHYLENE GLYCOL 3350 17 G PO PACK
17.0000 g | PACK | Freq: Every day | ORAL | Status: DC
  Administered 2023-06-26 – 2023-06-27 (×2): 17 g via ORAL
  Filled 2023-06-26 (×2): qty 1

## 2023-06-26 MED ORDER — FERROUS SULFATE 325 (65 FE) MG PO TABS
325.0000 mg | ORAL_TABLET | ORAL | Status: DC
  Administered 2023-06-26 – 2023-07-10 (×8): 325 mg via ORAL
  Filled 2023-06-26 (×9): qty 1

## 2023-06-26 MED ORDER — SODIUM CHLORIDE 0.9% FLUSH: 10.0000 mL | INTRAVENOUS | Status: DC | PRN

## 2023-06-26 MED ORDER — HYDROCODONE-ACETAMINOPHEN 5-325 MG PO TABS
1.0000 | ORAL_TABLET | ORAL | Status: DC | PRN
Start: 2023-06-26 — End: 2023-06-27
  Administered 2023-06-26 – 2023-06-27 (×3): 1 via ORAL
  Filled 2023-06-26 (×3): qty 1

## 2023-06-26 MED ORDER — SODIUM CHLORIDE 0.9% IV SOLUTION: Freq: Once | INTRAVENOUS | Status: AC

## 2023-06-26 MED ORDER — HYDROMORPHONE HCL 1 MG/ML IJ SOLN
INTRAMUSCULAR | Status: AC
  Filled 2023-06-26: qty 1

## 2023-06-26 MED ORDER — HYDROMORPHONE HCL 1 MG/ML IJ SOLN
0.5000 mg | Freq: Once | INTRAMUSCULAR | Status: AC
  Administered 2023-06-26: 0.5 mg via INTRAVENOUS

## 2023-06-26 MED ORDER — SENNOSIDES-DOCUSATE SODIUM 8.6-50 MG PO TABS
1.0000 | ORAL_TABLET | Freq: Every day | ORAL | Status: DC
  Administered 2023-06-26: 1 via ORAL
  Filled 2023-06-26: qty 1

## 2023-06-26 MED ORDER — ORAL CARE MOUTH RINSE: 15.0000 mL | OROMUCOSAL | Status: DC | PRN

## 2023-06-26 NOTE — Plan of Care (Signed)
  Problem: Clinical Measurements: Goal: Ability to maintain clinical measurements within normal limits will improve Outcome: Not Progressing   Problem: Activity: Goal: Risk for activity intolerance will decrease Outcome: Not Progressing   Problem: Safety: Goal: Ability to remain free from injury will improve Outcome: Progressing   Problem: Skin Integrity: Goal: Risk for impaired skin integrity will decrease Outcome: Not Progressing

## 2023-06-26 NOTE — TOC Initial Note (Signed)
 Transition of Care [facility]) - Initial/Assessment Note   Patient Details  Name: Lauren Salazar MRN: [id number] Date of Birth: 1960-05-16  [facility]) CM/SW Contact:    Ewing Schlein, LCSW Phone Number: 06/26/2023, 1:21 PM  Clinical Narrative: Patient is from home. Patient does not use home oxygen at baseline, but is currently on 5L/min. TOC to follow for possible home oxygen needs.  Expected Discharge Plan: Home/Self Care Barriers to Discharge: Continued Medical Work up  Expected Discharge Plan and Services In-house Referral: Clinical Social Work Living arrangements for the past 2 months: Single Family Home  Prior Living Arrangements/Services Living arrangements for the past 2 months: Single Family Home Lives with:: Self Patient language and need for interpreter reviewed:: Yes Do you feel safe going back to the place where you live?: Yes      Need for Family Participation in Patient Care: No (Comment) Care giver support system in place?: Yes (comment) Criminal Activity/Legal Involvement Pertinent to Current Situation/Hospitalization: No - Comment as needed  Emotional Assessment Alcohol / Substance Use: Not Applicable Psych Involvement: No (comment)  Admission diagnosis:  Septic shock (HCC) [A41.9, R65.21] Pneumonia of both lungs due to infectious organism, unspecified part of lung [J18.9] Patient Active Problem List   Diagnosis Date Noted   Septic shock (HCC) 06/25/2023   Port-A-Cath in place 10/04/2022   Chronic pain 07/27/2020   Hardening of the aorta (main artery of the heart) (HCC) 07/27/2020   Sciatica 07/27/2020   Tobacco user 07/27/2020   Goals of care, counseling/discussion 07/22/2020   Cancer of lower lobe of right lung (HCC) 04/02/2019   Lung cancer (HCC) 03/14/2019   Bladder cancer (HCC) 02/01/2019   PCP:  [facility] Pharmacy:   [id number] [place], [place] - [address] AT [facility] [address] [place] [address] Phone: [phone] Fax: [phone]  [facility] [id number] - [place], [place] - [address] [place] [address] Phone: [phone] Fax: [phone]  [place] [facility] [address] [place] [address] Phone: [phone] Fax: [phone]  [id number] [place], [place] - [address] AT [facility] OF [address] & [address] [place] [address] Phone: [phone] Fax: [phone]  Social Drivers of Health (SDOH) Social History: SDOH Screenings   Transportation Needs: No Transportation Needs (04/02/2019)  Tobacco Use: High Risk (06/25/2023)   SDOH Interventions:    Readmission Risk Interventions     No data to display

## 2023-06-26 NOTE — Assessment & Plan Note (Addendum)
 Acute respiratory failure with hypoxia Necrotizing right lower lobe lung mass, organizing right pleural effusion  Presented with leukocytosis, tachypnea, respiratory failure, and lactic acid 5.9 in the setting of pneumonia.  CT showed organized parapneumonic area, suspect this is largely tumor, and effluent.  Pulmonology consulted, recommended against Chest tube.  Thankfully, weaned from 50L HHFNC to 2-4L over the weekend and WBC normalized.  Sputum with normal respiratory flora. - Continue Zosyn, day 7 of 7 - Continue chest PT - Continue ICS/LAMA/LABA

## 2023-06-26 NOTE — Assessment & Plan Note (Addendum)
 Of the sacrum and right buttock, unstageable, present on admission - Consult wound care

## 2023-06-26 NOTE — Progress Notes (Signed)
  Progress Note   Patient: Lauren Salazar ION:[id number] DOB: 15-May-1960 DOA: 06/25/2023     1 DOS: the patient was seen and examined on 06/26/2023 at 11:55AM      Brief hospital course: 63 y.o. 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     Assessment and Plan: * Septic shock (HCC) Acute respiratory failure with hypoxia due to pneumonia Presented with leukocytosis, tachypnea, respiratory failure, and lactic acid 5.9 in the setting of pneumonia. -Continue Rocephin and azithromycin - IS and flutter - Repeat Lactic acid -Continue bronchodilators - Continue ICS/LABA  Iron deficiency anemia This appears to be new since last October, hemoglobin is dropped fairly dramatically.  No reported or observed GI bleeding.  Transfused 1 unit 3/3 - Trend hemoglobin - Start oral iron  Pressure injury of skin Of the sacrum, unstageable, present on admission - Consult wound care  Lung cancer [facility]) Metastatic to bone and adrenals - Oncology have been added to the treatment team          Subjective: Patient still very weak and tired.  No fever, no confusion.  Tachypneic.  Short of breath.  Very wheezy.     Physical Exam: BP 123/70   Pulse (!) 115   Temp 98.1 F (36.7 C) (Oral)   Resp (!) 31   Ht 5\' 6"  (1.676 m)   Wt 50 kg   SpO2 97%   BMI 17.79 kg/m   Elderly adult female, lying in bed, appears weak and tired Tachycardic, no murmurs, no peripheral edema Respiratory rate increased, lung sounds with wheezing bilaterally, diminished overall, rales at the left base, diminished at the right base Abdomen soft, no tenderness palpation or guarding, no ascites or distention Attention normal, affect blunted, judgment and insight appear normal, generalized weakness but symmetric strength, face symmetric, speech fluent, oriented to person, place,  and time    Data Reviewed: Basic metabolic panel is unremarkable CBC shows white count down to the 20s Hemoglobin 6.4 this morning, up to 8 post transfusion Iron saturation 5% Lactic acid elevated  Family Communication: Daughter by phone    Disposition: Status is: Inpatient Patient has metastatic cancer and was admitted with respiratory failure and septic shock  She has stabilized, mentating okay  Her prognosis is guarded due to her metastatic cancer, but hopefully things will turn around in the next 24 hours and she will start to improve        Author: Alberteen Sam, MD 06/26/2023 1:56 PM  For on call review [email or web address].

## 2023-06-26 NOTE — Consult Note (Signed)
 WOC Nurse Consult Note: Reason for Consult: Consult requested for sacrum and right buttock.  Both have Unstageable pressure injuries that are 100% tightly adhered yellow slough, small amt tan drainage. Sacrum 5X2cm, right buttock separated by a narrow bridge of intact skin, .3X.3cm Pressure Injury POA: Yes Dressing procedure/placement/frequency: Topical treatment orders provided for bedside nurse to perform as follows to assist with removal of nonviable tissue: Interchangeable with TheraHoney Apply Medihoney to sacrum and right buttock wounds Q day, then cover with foam dressing.  Change foam dressing Q 3 days or PRN soiling . Please re-consult if further assistance is needed.  Thank-you,  Cammie Mcgee MSN, RN, CWOCN, [place], CNS [phone]

## 2023-06-26 NOTE — Hospital Course (Addendum)
 Lauren Salazar is a 63 y.o. 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  Patient presented secondary to cough and shortness of breath and found to have evidence of pneumonia with sepsis physiology; imaging revealing necrotizing right lower lobe lung mass with organizing right pleural effusion.  Hospitalization complicated by severe hypoxia requiring heated high flow nasal cannula up to 50 L/min.  Patient managed on antibiotics.  Chest tube was considered but recommended against by pulmonology.  Patient improved with antibiotic therapy alone with supportive oxygen therapy.  Overall, poor prognosis per medical oncology with recommendation for transition to hospice care for which the patient disagrees at this time.  Discharge to SNF.

## 2023-06-26 NOTE — Assessment & Plan Note (Addendum)
 Transfused 1u on 3/3 and second unit on 3/5.  Stable since then.  This appears to be new since last October, hemoglobin is dropped fairly dramatically.  No reported or observed GI bleeding. - Trend hemoglobin - Continue new iron

## 2023-06-26 NOTE — Progress Notes (Signed)
 Changed urine collecting  bag (ileostomy) due to covered sediments & mucus from home.

## 2023-06-26 NOTE — Assessment & Plan Note (Addendum)
 Metastatic to bone and adrenals Had palliative radiation in the past to the right hip.  Had been on 3rd line therapy with docetaxel/VEGFR2 Ab until Feb, when it was stopped due to side effects, worsening functional status.  To me she is quite alert mentally, and reports that despite worsening functional status she was still fairly independent and had even gone out to eat with friends the weekend before she was admitted.    Palliative have been engaging in GOC discussions.  Dr. Leonides Schanz returns tomorrow.  Given her worsening right hip pain, I would refer to him if a second course of palliative radiation helps in situations like this.

## 2023-06-27 ENCOUNTER — Inpatient Hospital Stay (HOSPITAL_COMMUNITY)

## 2023-06-27 DIAGNOSIS — J189 Pneumonia, unspecified organism: Secondary | ICD-10-CM

## 2023-06-27 DIAGNOSIS — J9601 Acute respiratory failure with hypoxia: Secondary | ICD-10-CM | POA: Diagnosis not present

## 2023-06-27 DIAGNOSIS — R6521 Severe sepsis with septic shock: Secondary | ICD-10-CM | POA: Diagnosis not present

## 2023-06-27 DIAGNOSIS — J439 Emphysema, unspecified: Secondary | ICD-10-CM

## 2023-06-27 DIAGNOSIS — J449 Chronic obstructive pulmonary disease, unspecified: Secondary | ICD-10-CM

## 2023-06-27 DIAGNOSIS — A419 Sepsis, unspecified organism: Secondary | ICD-10-CM | POA: Diagnosis not present

## 2023-06-27 LAB — COMPREHENSIVE METABOLIC PANEL
ALT: 19 U/L (ref 0–44)
AST: 24 U/L (ref 15–41)
Albumin: 1.8 g/dL — ABNORMAL LOW (ref 3.5–5.0)
Alkaline Phosphatase: 82 U/L (ref 38–126)
Anion gap: 8 (ref 5–15)
BUN: 26 mg/dL — ABNORMAL HIGH (ref 8–23)
CO2: 22 mmol/L (ref 22–32)
Calcium: 7.6 mg/dL — ABNORMAL LOW (ref 8.9–10.3)
Chloride: 102 mmol/L (ref 98–111)
Creatinine, Ser: 0.72 mg/dL (ref 0.44–1.00)
GFR, Estimated: >60 mL/min (ref >60)
Glucose, Bld: 83 mg/dL (ref 70–99)
Potassium: 3.5 mmol/L (ref 3.5–5.1)
Sodium: 132 mmol/L — ABNORMAL LOW (ref 135–145)
Total Bilirubin: 0.6 mg/dL (ref 0.0–1.2)
Total Protein: 5.6 g/dL — ABNORMAL LOW (ref 6.5–8.1)

## 2023-06-27 LAB — CBC
HCT: 29.4 % — ABNORMAL LOW (ref 36.0–46.0)
Hemoglobin: 8.7 g/dL — ABNORMAL LOW (ref 12.0–15.0)
MCH: 24.7 pg — ABNORMAL LOW (ref 26.0–34.0)
MCHC: 29.6 g/dL — ABNORMAL LOW (ref 30.0–36.0)
MCV: 83.5 fL (ref 80.0–100.0)
Platelets: 243 10*3/uL (ref 150–400)
RBC: 3.52 MIL/uL — ABNORMAL LOW (ref 3.87–5.11)
RDW: 21.9 % — ABNORMAL HIGH (ref 11.5–15.5)
WBC: 21 10*3/uL — ABNORMAL HIGH (ref 4.0–10.5)
nRBC: 0 % (ref 0.0–0.2)

## 2023-06-27 LAB — EXPECTORATED SPUTUM ASSESSMENT W GRAM STAIN, RFLX TO RESP C

## 2023-06-27 LAB — RESPIRATORY PANEL BY PCR

## 2023-06-27 LAB — STREP PNEUMONIAE URINARY ANTIGEN: Strep Pneumo Urinary Antigen: NEGATIVE

## 2023-06-27 MED ORDER — SODIUM CHLORIDE 0.9 % IV SOLN: 2.0000 g | Freq: Every day | INTRAVENOUS | Status: DC

## 2023-06-27 MED ORDER — SENNOSIDES-DOCUSATE SODIUM 8.6-50 MG PO TABS
1.0000 | ORAL_TABLET | Freq: Two times a day (BID) | ORAL | Status: DC
  Administered 2023-06-27 – 2023-06-28 (×3): 1 via ORAL
  Filled 2023-06-27 (×8): qty 1

## 2023-06-27 MED ORDER — POLYETHYLENE GLYCOL 3350 17 G PO PACK
17.0000 g | PACK | Freq: Two times a day (BID) | ORAL | Status: DC
  Administered 2023-06-27 – 2023-06-28 (×2): 17 g via ORAL
  Filled 2023-06-27 (×6): qty 1

## 2023-06-27 MED ORDER — SODIUM CHLORIDE 0.9 % IV SOLN: INTRAVENOUS | Status: DC

## 2023-06-27 MED ORDER — BUDESONIDE 0.5 MG/2ML IN SUSP
0.5000 mg | Freq: Two times a day (BID) | RESPIRATORY_TRACT | Status: DC
  Administered 2023-06-27 – 2023-07-10 (×27): 0.5 mg via RESPIRATORY_TRACT
  Filled 2023-06-27 (×28): qty 2

## 2023-06-27 MED ORDER — HYDROCODONE-ACETAMINOPHEN 7.5-325 MG PO TABS
1.0000 | ORAL_TABLET | ORAL | Status: DC | PRN
  Administered 2023-06-27 – 2023-07-02 (×23): 1 via ORAL
  Filled 2023-06-27 (×24): qty 1

## 2023-06-27 MED ORDER — PIPERACILLIN-TAZOBACTAM 3.375 G IVPB
3.3750 g | Freq: Three times a day (TID) | INTRAVENOUS | Status: AC
  Administered 2023-06-27 – 2023-07-04 (×21): 3.375 g via INTRAVENOUS
  Filled 2023-06-27 (×21): qty 50

## 2023-06-27 MED ORDER — REVEFENACIN 175 MCG/3ML IN SOLN
175.0000 ug | Freq: Every day | RESPIRATORY_TRACT | Status: DC
  Administered 2023-06-28 – 2023-07-10 (×13): 175 ug via RESPIRATORY_TRACT
  Filled 2023-06-27 (×14): qty 3

## 2023-06-27 MED ORDER — FENTANYL CITRATE PF 50 MCG/ML IJ SOSY
25.0000 ug | PREFILLED_SYRINGE | INTRAMUSCULAR | Status: DC | PRN
  Administered 2023-06-27 – 2023-07-02 (×13): 25 ug via INTRAVENOUS
  Filled 2023-06-27 (×14): qty 1

## 2023-06-27 MED ORDER — MAGIC MOUTHWASH W/LIDOCAINE: 15.0000 mL | Freq: Three times a day (TID) | ORAL | Status: DC | PRN

## 2023-06-27 MED ORDER — ARFORMOTEROL TARTRATE 15 MCG/2ML IN NEBU
15.0000 ug | INHALATION_SOLUTION | Freq: Two times a day (BID) | RESPIRATORY_TRACT | Status: DC
  Administered 2023-06-27 – 2023-07-10 (×27): 15 ug via RESPIRATORY_TRACT
  Filled 2023-06-27 (×28): qty 2

## 2023-06-27 MED ORDER — CARMEX CLASSIC LIP BALM EX OINT
TOPICAL_OINTMENT | CUTANEOUS | Status: DC | PRN
Start: 2023-06-27 — End: 2023-07-10
  Administered 2023-06-27: 1 via TOPICAL
  Filled 2023-06-27: qty 10

## 2023-06-27 MED ORDER — IOHEXOL 300 MG/ML  SOLN
75.0000 mL | Freq: Once | INTRAMUSCULAR | Status: AC | PRN
  Administered 2023-06-27: 75 mL via INTRAVENOUS

## 2023-06-27 MED ORDER — HYDROCORTISONE SOD SUC (PF) 100 MG IJ SOLR
100.0000 mg | Freq: Every day | INTRAMUSCULAR | Status: DC
  Administered 2023-06-27 – 2023-06-29 (×3): 100 mg via INTRAVENOUS
  Filled 2023-06-27 (×4): qty 2

## 2023-06-27 MED ORDER — SODIUM CHLORIDE 0.9 % IV SOLN
1.0000 g | Freq: Once | INTRAVENOUS | Status: AC
  Administered 2023-06-27: 1 g via INTRAVENOUS
  Filled 2023-06-27: qty 10

## 2023-06-27 NOTE — Progress Notes (Signed)
 Pharmacy Antibiotic Note  Lauren Salazar is a 63 y.o. female admitted on 06/25/2023 with pneumonia.  She was started on Ceftriaxone and Azithromycin on admission.  Pharmacy is now consulted for Zosyn dosing.  Plan: Zosyn 3.375g IV Q8H infused over 4hrs.  Follow up renal function, culture results, and clinical course.   Height: 5\' 6"  (167.6 cm) Weight: 50 kg (110 lb 3.7 oz) IBW/kg (Calculated) : 59.3  Temp (24hrs), Avg:98.1 F (36.7 C), Min:97.6 F (36.4 C), Max:98.6 F (37 C)  Recent Labs  Lab 06/25/23 1024 06/25/23 1038 06/25/23 1308 06/25/23 1645 06/26/23 0317 06/26/23 1354 06/27/23 0253  WBC 40.2*  --   --   --  21.8*  --  21.0*  CREATININE 1.04*  --   --   --  0.93  --  0.72  LATICACIDVEN  --  4.1* 5.9* 5.6*  --  1.6  --     Estimated Creatinine Clearance: 56.1 mL/min (by C-G formula based on SCr of 0.72 mg/dL).    Allergies  Allergen Reactions   Morphine Sulfate Other (See Comments)    hallucination   Celecoxib Palpitations and Other (See Comments)    Chest pain    Antimicrobials this admission:  PTA azithromycin >> last on 3/1 3/2 Azithromycin >> 3/6 3/2 Ceftriaxone >> 3/4 3/2 vancomycin x1 3/4 Zosyn >>   Microbiology results:  3/2 Resp panel: neg covid, flu, rsv 3/2 BCx: ngtd 3/3 MRSA PCR: not detected 3/4 RVP:   3/4 Resp:   Thank you for allowing pharmacy to be a part of this patient's care.  Lynann Beaver PharmD, BCPS [facility] main [facility] [phone] 06/27/2023 1:39 PM

## 2023-06-27 NOTE — Progress Notes (Signed)
  Progress Note   Patient: Lauren Salazar:[id number] DOB: 22-Oct-1960 DOA: 06/25/2023     2 DOS: the patient was seen and examined on 06/27/2023 at 7:26AM      Brief hospital course: 63 y.o. 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     Assessment and Plan: *Septic shock due to pneumonia in the setting of metastatic lung cancer Overnight she remains tachycardic, hypoxic.  She is a very weak cough, and has been poorly able to engage with respiratory therapy - Consult RT to the bedside - Continue Rocephin and azithromycin - Follow culture data - Start stress dose steroids - Continue albuterol - Hold ICS/LABA - Continue LAMA   Lung cancer metastatic to bone and adrenals -Start stress dose steroids  - Continue Norco  Acute on chronic anemia of iron deficiency  Still no evidence of bleeding observed in the hospital.  Overall anemia appears to be fairly dramatically new since October.  She was transfused 1 unit 3/3 and started on oral iron. Hemoglobin up today - Trend hemoglobin - Continue oral iron   Hyponatremia Appears dehydrated - Start IV fluids  Pressure injury of skin Of the sacrum, unstageable, present on admission - Consult wound care            Subjective: Patient was unable to sleep last night.  She has severe pain in her buttocks.  She has had no confusion.  She still has a severe cough and severe tachycardia.     Physical Exam: BP 118/74   Pulse (!) 122   Temp 97.6 F (36.4 C) (Oral)   Resp (!) 30   Ht 5\' 6"  (1.676 m)   Wt 50 kg   SpO2 94%   BMI 17.79 kg/m   Frail elderly female, lying bed, appears restless, uncomfortable Tachycardic, regular, no murmurs, no peripheral edema Respiratory effort weak, lungs diminished, wheezing bilaterally Abdomen soft no tenderness palpation or guarding Attention  normal, affect tired Severe generalized weakness, oriented to person, place, and time    Data Reviewed: Patient metabolic panel shows new hyponatremia, renal function unchanged CBC shows hemoglobin trending up, white blood cell count elevated   Family Communication:     Disposition: Status is: Inpatient         Author: Alberteen Sam, MD 06/27/2023 7:39 AM  For on call review [email or web address].

## 2023-06-27 NOTE — Progress Notes (Signed)
 Patient placed on HHFNC due to desaturations, 40L, 70%. CCM notified.

## 2023-06-27 NOTE — Plan of Care (Signed)
  Problem: Clinical Measurements: Goal: Diagnostic test results will improve Outcome: Not Progressing Goal: Respiratory complications will improve Outcome: Not Progressing   Problem: Activity: Goal: Risk for activity intolerance will decrease Outcome: Not Progressing   Problem: Pain Managment: Goal: General experience of comfort will improve and/or be controlled Outcome: Not Progressing

## 2023-06-27 NOTE — Consult Note (Signed)
 NAME:  Lauren Salazar, MRN:  [id number], DOB:  1960-07-18, LOS: 2 ADMISSION DATE:  06/25/2023, CONSULTATION DATE:  06/27/23 REFERRING MD:  Joen Laura, MD CHIEF COMPLAINT:  resp failure/pneumonia   History of Present Illness:  Lauren Salazar is a 63 year old woman with metastatic non-small cell lung cancer to the bone and adrenal glands who is admitted 3/2 for pneumonia and acute hypoxemic respiratory failure. She has had increasing requirements oxygen requirements and transferred to the ICU. PCCM consulted today due to the increasing oxygen requirements.   CT Chest scan with contrast performed to evaluate pleural effusion noted on CT Chest scan 05/25/23. Today's CT is notable for enlarging mediastinal lymph nodes, subcarinal node mass and progressive left axillary and subpectoral adenopathy. Heterogeneous enhancing mass in the right lower lobe measuring 6.8 x 4.7cm with increased necrosis. The mass communicates with an increasingly organized right pleural fluid collection posteriorly with increased air within this collection.   She is having productive cough. Reports having issues over the past month with concern for pneumonia being treated as outpatient. She is very short of breath. Denies hemoptysis.   Pertinent  Medical History   Past Medical History:  Diagnosis Date   Bladder tumor    Cancer (HCC)    Complication of anesthesia    slow to wake after spinal fusion, then was "looney" for the following 24 hours    Persistent dry cough    ongoing since feb 2020   Significant Hospital Events: Including procedures, antibiotic start and stop dates in addition to other pertinent events   3/2 admitted 3/3 transferred to ICU, placed   Interim History / Subjective:  As above  Reviewed case with CT Surgery and pulmonary colleagues given concern for necrotic mass communicating with pleural space. Recommended thoracentesis to evaluate the fluid and if concerning for empyema then  will consider chest tube.  Objective   Blood pressure 125/78, pulse (!) 128, temperature 97.8 F (36.6 C), temperature source Oral, resp. rate (!) 42, height 5\' 6"  (1.676 m), weight 50 kg, SpO2 100%.        Intake/Output Summary (Last 24 hours) at 06/27/2023 1007 Last data filed at 06/27/2023 0430 Gross per 24 hour  Intake 908.04 ml  Output 2075 ml  Net -1166.96 ml   Filed Weights   06/26/23 0100  Weight: 50 kg    Examination: General: elderly woman, mild respiratory distress - increase work of breathing HENT: [place]/AT, moist mucous membranes Lungs: course breath sounds, diminished right base Cardiovascular: tachycardic, no murmurs Abdomen: soft, BS+ Extremities: warm, no edema Neuro: alert, moving all extremities GU: n/a  CT Chest w/ contrast 06/27/23 1. Increased necrosis within the right lower lobe mass with increased organization of the adjacent right pleural fluid collection. There is increased air within this collection and increased peripheral enhancement, suspicious for empyema. 2. Increased consolidation and cavitation in the right middle lobe. New moderate-sized dependent left pleural effusion with new patchy airspace disease dependently in the left upper and lower lobes, most consistent with pneumonia. 3. Progressive metastatic disease with enlarging mediastinal and left axillary/subpectoral lymph nodes, enlarging necrotic right lower lobe mass and enlarging bilateral adrenal metastases. Possible tumor extension into the IVC from the right adrenal gland. Mildly progressive metastasis involving the posterior aspect of the left 11th rib. 4. Aortic Atherosclerosis (ICD10-I70.0) and Emphysema (ICD10-J43.9).  Resolved Hospital Problem list     Assessment & Plan:  Acute Hypoxemic Respiratory Failure Sepsis due to Pneumonia Emphysema/COPD Right lower lobe necrotizing lung  mass Right complicated pleural effusion concern for empyema Metastatic non-small cell lung  cancer to bone and adrenal glands Anemia  Plan: - change ceftriaxone to zosyn for pneumonia and possible empyema coverage - plan for thoracentesis of right pleural effusion to evaluate for empyema - check sputum culture, urine legionella/strep ag - continue heated high flow nasal canula for SpO2 goal 92% or higher - start brovana and yupelri nebs   Best Practice (right click and "Reselect all SmartList Selections" daily)   Per primary  Labs   CBC: Recent Labs  Lab 06/25/23 1024 06/26/23 0317 06/26/23 1231 06/27/23 0253  WBC 40.2* 21.8*  --  21.0*  NEUTROABS 38.5*  --   --   --   HGB 8.3* 6.4* 8.0* 8.7*  HCT 30.7* 24.1* 27.0* 29.4*  MCV 82.5 84.9  --  83.5  PLT 348 207  --  243    Basic Metabolic Panel: Recent Labs  Lab 06/25/23 1024 06/26/23 0317 06/27/23 0253  NA 131* 136 132*  K 4.1 4.4 3.5  CL 97* 106 102  CO2 20* 21* 22  GLUCOSE 243* 126* 83  BUN 49* 35* 26*  CREATININE 1.04* 0.93 0.72  CALCIUM 8.3* 7.9* 7.6*   GFR: Estimated Creatinine Clearance: 56.1 mL/min (by C-G formula based on SCr of 0.72 mg/dL). Recent Labs  Lab 06/25/23 1024 06/25/23 1038 06/25/23 1308 06/25/23 1645 06/26/23 0317 06/26/23 1354 06/27/23 0253  WBC 40.2*  --   --   --  21.8*  --  21.0*  LATICACIDVEN  --  4.1* 5.9* 5.6*  --  1.6  --     Liver Function Tests: Recent Labs  Lab 06/25/23 1024 06/27/23 0253  AST 23 24  ALT 21 19  ALKPHOS 85 82  BILITOT 0.5 0.6  PROT 6.3* 5.6*  ALBUMIN 2.1* 1.8*   No results for input(s): "LIPASE", "AMYLASE" in the last 168 hours. No results for input(s): "AMMONIA" in the last 168 hours.  ABG No results found for: "PHART", "PCO2ART", "PO2ART", "HCO3", "TCO2", "ACIDBASEDEF", "O2SAT"   Coagulation Profile: No results for input(s): "INR", "PROTIME" in the last 168 hours.  Cardiac Enzymes: No results for input(s): "CKTOTAL", "CKMB", "CKMBINDEX", "TROPONINI" in the last 168 hours.  HbA1C: No results found for: "HGBA1C"  CBG: No  results for input(s): "GLUCAP" in the last 168 hours.  Review of Systems:   Review of Systems  Constitutional:  Positive for malaise/fatigue. Negative for chills, fever and weight loss.  HENT:  Negative for congestion, sinus pain and sore throat.   Eyes: Negative.   Respiratory:  Positive for cough, sputum production and shortness of breath. Negative for hemoptysis and wheezing.   Cardiovascular:  Negative for chest pain, palpitations, orthopnea, claudication and leg swelling.  Gastrointestinal:  Negative for abdominal pain, heartburn, nausea and vomiting.  Genitourinary: Negative.   Musculoskeletal:  Negative for joint pain and myalgias.  Skin:  Negative for rash.  Neurological:  Negative for weakness.  Endo/Heme/Allergies: Negative.   Psychiatric/Behavioral: Negative.       Past Medical History:  She,  has a past medical history of Bladder tumor, Cancer (HCC), Complication of anesthesia, and Persistent dry cough.   Surgical History:   Past Surgical History:  Procedure Laterality Date   CERVICAL FUSION  2005   c5-c6      CYSTOSCOPY WITH INJECTION N/A 02/01/2019   Procedure: CYSTOSCOPY WITH INJECTION;  Surgeon: Sebastian Ache, MD;  Location: [facility];  Service: Urology;  Laterality: N/A;   IR IMAGING GUIDED PORT  INSERTION  07/25/2022   LYMPHADENECTOMY Bilateral 02/01/2019   Procedure: LYMPHADENECTOMY;  Surgeon: Sebastian Ache, MD;  Location: [facility];  Service: Urology;  Laterality: Bilateral;   ROBOT ASSISTED LAPAROSCOPIC COMPLETE CYSTECT ILEAL CONDUIT N/A 02/01/2019   Procedure: XI ROBOTIC ASSISTED LAPAROSCOPIC COMPLETE CYSTECTOMY, ILEAL CONDUIT, HYSTERECTOMY WITH BILATERAL SALPINGOOPHORECTOMY;  Surgeon: Sebastian Ache, MD;  Location: [facility];  Service: Urology;  Laterality: N/A;  6 HRS   TRANSURETHRAL RESECTION OF BLADDER TUMOR N/A 10/31/2018   Procedure: TRANSURETHRAL RESECTION OF BLADDER TUMOR (TURBT) WITH CYSTOSCOPY/ POSSIBLE INTRAVESICAL GEMCITABINE;  Surgeon: Rene Paci, MD;  Location: [facility];  Service: Urology;  Laterality: N/A;     Social History:   reports that she has been smoking cigarettes. She has never used smokeless tobacco. She reports that she does not drink alcohol and does not use drugs.   Family History:  Her family history is not on file.   Allergies Allergies  Allergen Reactions   Morphine Sulfate Other (See Comments)    hallucination   Celecoxib Palpitations and Other (See Comments)    Chest pain     Home Medications  Prior to Admission medications   Medication Sig Start Date End Date Taking? Authorizing Provider  acetaminophen (TYLENOL) 500 MG tablet Take 500 mg by mouth every 6 (six) hours as needed for mild pain (pain score 1-3) or moderate pain (pain score 4-6).   Yes [provider]  albuterol (VENTOLIN HFA) 108 (90 Base) MCG/ACT inhaler Inhale 1-2 puffs into the lungs every 4 (four) hours as needed for wheezing or shortness of breath. 03/10/23  Yes [provider]  azithromycin (ZITHROMAX Z-PAK) 250 MG tablet Take as directed Patient taking differently: Take 250 mg by mouth See admin instructions. Take as directed 06/19/23  Yes Pickenpack-Cousar, Arty Baumgartner, NP  benzonatate (TESSALON) 100 MG capsule Take 2 capsules (200 mg total) by mouth 3 (three) times daily as needed for cough. 04/25/23  Yes Jaci Standard, MD  Budeson-Glycopyrrol-Formoterol (BREZTRI AEROSPHERE) 160-9-4.8 MCG/ACT AERO Inhale 2 puffs into the lungs daily. 04/06/23  Yes [provider]  dexamethasone (DECADRON) 4 MG tablet Take 1 tablet twice daily for 7 days then take 1 tablet daily for 7 days. 06/19/23  Yes Pickenpack-Cousar, Arty Baumgartner, NP  folic acid (FOLVITE) 1 MG tablet TAKE 1 TABLET(1 MG) BY MOUTH DAILY Patient taking differently: Take 1 mg by mouth daily. TAKE 1 TABLET(1 MG) BY MOUTH DAILY 08/11/22  Yes Briant Cedar, PA-C  HYDROcodone-acetaminophen (NORCO/VICODIN) 5-325 MG tablet Take 1 tablet by mouth every 6 (six) hours  as needed for moderate pain (pain score 4-6) or severe pain (pain score 7-10) (breakthrough pain in between use of morphine extended release). 06/13/23  Yes Pickenpack-Cousar, Athena N, NP  lidocaine (LIDODERM) 5 % Place 1 patch onto the skin daily. Remove & Discard patch within 12 hours or as directed by MD 12/20/22  Yes Pickenpack-Cousar, Arty Baumgartner, NP  magic mouthwash (nystatin, diphenhydrAMINE, alum & mag hydroxide) suspension mixture Swish and swallow 10 mLs 3 (three) times daily. 06/23/23  Yes Pickenpack-Cousar, Arty Baumgartner, NP  meloxicam (MOBIC) 15 MG tablet Take 1 tablet (15 mg total) by mouth daily. 06/19/23  Yes Pickenpack-Cousar, Arty Baumgartner, NP  morphine (MS CONTIN) 15 MG 12 hr tablet Take 1 tablet (15 mg total) by mouth every 12 (twelve) hours. 06/12/23  Yes Pickenpack-Cousar, Arty Baumgartner, NP  nicotine (NICODERM CQ - DOSED IN MG/24 HOURS) 21 mg/24hr patch Place 1 patch (21 mg total) onto the skin  daily. 12/20/22  Yes Pickenpack-Cousar, Athena N, NP  senna-docusate (SENNA S) 8.6-50 MG tablet Take 2 tablets by mouth at bedtime. 06/12/23  Yes Pickenpack-Cousar, Arty Baumgartner, NP  prochlorperazine (COMPAZINE) 10 MG tablet Take 1 tablet (10 mg total) by mouth every 6 (six) hours as needed for nausea or vomiting. Patient not taking: Reported on 06/25/2023 07/19/22   Glee Arvin, NP     Critical care time: 50 minutes    Melody Comas, MD [facility] Office: [phone]   See Amion for personal pager PCCM on call pager [phone] until 7pm. Please call Elink 7p-7a. [phone]

## 2023-06-28 DIAGNOSIS — Z7189 Other specified counseling: Secondary | ICD-10-CM

## 2023-06-28 DIAGNOSIS — R6521 Severe sepsis with septic shock: Secondary | ICD-10-CM | POA: Diagnosis not present

## 2023-06-28 DIAGNOSIS — J189 Pneumonia, unspecified organism: Secondary | ICD-10-CM | POA: Diagnosis not present

## 2023-06-28 DIAGNOSIS — Z515 Encounter for palliative care: Secondary | ICD-10-CM

## 2023-06-28 DIAGNOSIS — A419 Sepsis, unspecified organism: Secondary | ICD-10-CM | POA: Diagnosis not present

## 2023-06-28 DIAGNOSIS — J9601 Acute respiratory failure with hypoxia: Secondary | ICD-10-CM | POA: Diagnosis not present

## 2023-06-28 DIAGNOSIS — J449 Chronic obstructive pulmonary disease, unspecified: Secondary | ICD-10-CM | POA: Diagnosis not present

## 2023-06-28 LAB — BASIC METABOLIC PANEL
Anion gap: 10 (ref 5–15)
BUN: 23 mg/dL (ref 8–23)
CO2: 23 mmol/L (ref 22–32)
Calcium: 7.7 mg/dL — ABNORMAL LOW (ref 8.9–10.3)
Chloride: 103 mmol/L (ref 98–111)
Creatinine, Ser: 0.79 mg/dL (ref 0.44–1.00)
GFR, Estimated: >60 mL/min (ref >60)
Glucose, Bld: 106 mg/dL — ABNORMAL HIGH (ref 70–99)
Potassium: 3.3 mmol/L — ABNORMAL LOW (ref 3.5–5.1)
Sodium: 136 mmol/L (ref 135–145)

## 2023-06-28 LAB — CBC
HCT: 26.7 % — ABNORMAL LOW (ref 36.0–46.0)
Hemoglobin: 7.7 g/dL — ABNORMAL LOW (ref 12.0–15.0)
MCH: 24.4 pg — ABNORMAL LOW (ref 26.0–34.0)
MCHC: 28.8 g/dL — ABNORMAL LOW (ref 30.0–36.0)
MCV: 84.8 fL (ref 80.0–100.0)
Platelets: 220 10*3/uL (ref 150–400)
RBC: 3.15 MIL/uL — ABNORMAL LOW (ref 3.87–5.11)
RDW: 22.2 % — ABNORMAL HIGH (ref 11.5–15.5)
WBC: 14.6 10*3/uL — ABNORMAL HIGH (ref 4.0–10.5)
nRBC: 0 % (ref 0.0–0.2)

## 2023-06-28 LAB — PREPARE RBC (CROSSMATCH)

## 2023-06-28 LAB — OCCULT BLOOD X 1 CARD TO LAB, STOOL: Fecal Occult Bld: NEGATIVE

## 2023-06-28 MED ORDER — SODIUM CHLORIDE 3 % IN NEBU
4.0000 mL | INHALATION_SOLUTION | Freq: Two times a day (BID) | RESPIRATORY_TRACT | Status: AC
  Administered 2023-06-28 – 2023-06-30 (×6): 4 mL via RESPIRATORY_TRACT
  Filled 2023-06-28 (×6): qty 4

## 2023-06-28 MED ORDER — SODIUM CHLORIDE 0.9% IV SOLUTION: Freq: Once | INTRAVENOUS | Status: AC

## 2023-06-28 NOTE — Progress Notes (Signed)
  Progress Note   Patient: Lauren Salazar ZOX:[id number] DOB: 11/10/1960 DOA: 06/25/2023     3 DOS: the patient was seen and examined on 06/28/2023 at 8:55AM      Brief hospital course: 63 y.o. 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     Assessment and Plan: * Septic shock (HCC) Oxygen requirements increasing overnight, up to 15 L high flow nasal cannula today, but overall actually feels better. - Continue antibiotics, escalated to Zosyn - Follow-up sputum culture, Legionella - Consult oncology, appreciate assistance and expertise - Continue LAMA/LABA - New hypertonic nebs - Follow urine Legionella -Continue hydrocortisone day 2 of 3  Iron deficiency anemia This appears to be new since last October, hemoglobin is dropped fairly dramatically.  Transfused 1 unit 3/3 Still no observed bleeding, but hemoglobin trending back down to 7.7 today.  In setting of advanced respiratory failure and tachycardia, will transfuse again - Transfuse hemoglobin - Continue oral iron   Pressure injury of skin Of the sacrum and buttock, unstageable, present on admission - Consult wound care  Lung cancer [facility]) Metastatic to bone and adrenals -Consult oncology and palliative care          Subjective: Patient overall is alert, optimistic to get better, she still has a weak cough, and difficulty clearing secretions, but we are aggressively managing this with respiratory therapy assistance.  No fever overnight, white count coming down.  Tried to do thoracentesis yesterday but no fluid collection could be safely approached.     Physical Exam: BP 128/78   Pulse (!) 116   Temp 97.9 F (36.6 C) (Oral)   Resp (!) 29   Ht 5\' 6"  (1.676 m)   Wt 50 kg   SpO2 93%   BMI 17.79 kg/m   Thin adult female, lying in bed, interactive and  appropriate Tachycardic, regular, no murmurs, no peripheral edema Respiratory rate elevated, on high flow nasal cannula, overall diminished, worse on the right, crackles on the left Abdomen soft, no tenderness palpation or guarding Attention normal, affect normal, judgment and insight appear normal, oriented to person place to place, and time, generalized weakness  Data Reviewed: Discussed with critical care White count down to 14, hemoglobin down to 7.7 Potassium down to 3.3, creatinine 0.7 Respiratory virus panel negative Strep pneumo urinary antigen negative Sputum still pending CT chest, reviewed and discussed with pulmonology, shows necrotic right lower lobe mass, adjacent fluid collection, fairly extensive bilateral pneumonia  Family Communication: Daughter by phone    Disposition: Status is: Inpatient         Author: Alberteen Sam, MD 06/28/2023 3:18 PM  For on call review [email or web address].

## 2023-06-28 NOTE — Consult Note (Signed)
 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 She has had increasing requirements oxygen requirements and transferred to the ICU. PCCM consulted today due to the increasing oxygen requirements.   CT Chest scan with contrast performed to evaluate pleural effusion noted on CT Chest scan 05/25/23. Today's CT is notable for enlarging mediastinal lymph nodes, subcarinal node mass and progressive left axillary and subpectoral adenopathy. Heterogeneous enhancing mass in the right lower lobe measuring 6.8 x 4.7cm with increased necrosis. The mass communicates with an increasingly organized right pleural fluid collection posteriorly with increased air within this collection.   She is having productive cough. Reports having issues over the past month with concern for pneumonia being treated as outpatient. She is very short of breath. Denies hemoptysis.  Pertinent  Medical History   Past Medical History:  Diagnosis Date   Bladder tumor    Cancer (HCC)    Complication of anesthesia    slow to wake after spinal fusion, then was "looney" for the following 24 hours    Persistent dry cough    ongoing since feb 2020   Significant Hospital Events: Including procedures, antibiotic start and stop dates in addition to other pertinent events   3/2 admitted 3/3 transferred to ICU, placed on HHFNC  Interim History / Subjective:   Too small of a window to attempt thoracentesis yesterday.  No acute events overnight On 90% FiO2 and 50L   Objective   Blood pressure 119/75, pulse (!) 123, temperature 98.7 F (37.1 C), temperature  source Oral, resp. rate (!) 30, height 5\' 6"  (1.676 m), weight 50 kg, SpO2 91%.    FiO2 (%):  [60 %-80 %] 80 %   Intake/Output Summary (Last 24 hours) at 06/28/2023 0825 Last data filed at 06/28/2023 0600 Gross per 24 hour  Intake 587.61 ml  Output 1455 ml  Net -867.39 ml   Filed Weights   06/26/23 0100  Weight: 50 kg    Examination: General: elderly woman, mild increase work of breathing HENT: /AT, moist mucous membranes Lungs: course breath sounds, diminished right base Cardiovascular: tachycardic, no murmurs Abdomen: soft, BS+ Extremities: warm, no edema Neuro: alert, moving all extremities GU: n/a  CT Chest w/ contrast 06/27/23 1. Increased necrosis within the right lower lobe mass with increased organization of the adjacent right pleural fluid collection. There is increased air within this collection and increased peripheral enhancement, suspicious for empyema. 2. Increased consolidation and cavitation in the right middle lobe. New moderate-sized dependent left pleural effusion with new patchy airspace disease dependently in the left upper and lower lobes, most consistent with pneumonia. 3. Progressive metastatic disease with enlarging mediastinal and left axillary/subpectoral lymph nodes, enlarging necrotic right lower lobe mass and enlarging bilateral adrenal metastases. Possible tumor extension into the IVC from the right adrenal gland. Mildly progressive metastasis involving the posterior aspect of the left 11th rib. 4. Aortic Atherosclerosis (ICD10-I70.0) and Emphysema (ICD10-J43.9).  EKG 3/5: sinus tach  3/4 sputum culture: gram positive rods and gram positive cocci in chains  Resolved Hospital Problem list     Assessment & Plan:  Acute Hypoxemic Respiratory Failure Sepsis due to Pneumonia  Emphysema/COPD Right lower lobe necrotizing lung mass Right complicated pleural effusion concern for empyema Metastatic non-small cell lung cancer to bone and adrenal  glands Anemia  Plan: - continue zosyn for pneumonia coverage - repeat bedside US today to monitor effusions - f/u sputum culture, urine legionella ag - continue heated high flow nasal canula for SpO2 goal 90-92% or higher - continue brovana and yupelri nebs - flutter valve and chest vest for airway clearance - add hypertonic nebs today  Best Practice (right click and "Reselect all SmartList Selections" daily)   Per primary  Labs   CBC: Recent Labs  Lab 06/25/23 1024 06/26/23 0317 06/26/23 1231 06/27/23 0253 06/28/23 0248  WBC 40.2* 21.8*  --  21.0* 14.6*  NEUTROABS 38.5*  --   --   --   --   HGB 8.3* 6.4* 8.0* 8.7* 7.7*  HCT 30.7* 24.1* 27.0* 29.4* 26.7*  MCV 82.5 84.9  --  83.5 84.8  PLT 348 207  --  243 220    Basic Metabolic Panel: Recent Labs  Lab 06/25/23 1024 06/26/23 0317 06/27/23 0253 06/28/23 0248  NA 131* 136 132* 136  K 4.1 4.4 3.5 3.3*  CL 97* 106 102 103  CO2 20* 21* 22 23  GLUCOSE 243* 126* 83 106*  BUN 49* 35* 26* 23  CREATININE 1.04* 0.93 0.72 0.79  CALCIUM 8.3* 7.9* 7.6* 7.7*   GFR: Estimated Creatinine Clearance: 56.1 mL/min (by C-G formula based on SCr of 0.79 mg/dL). Recent Labs  Lab 06/25/23 1024 06/25/23 1038 06/25/23 1308 06/25/23 1645 06/26/23 0317 06/26/23 1354 06/27/23 0253 06/28/23 0248  WBC 40.2*  --   --   --  21.8*  --  21.0* 14.6*  LATICACIDVEN  --  4.1* 5.9* 5.6*  --  1.6  --   --     Liver Function Tests: Recent Labs  Lab 06/25/23 1024 06/27/23 0253  AST 23 24  ALT 21 19  ALKPHOS 85 82  BILITOT 0.5 0.6  PROT 6.3* 5.6*  ALBUMIN 2.1* 1.8*   No results for input(s): "LIPASE", "AMYLASE" in the last 168 hours. No results for input(s): "AMMONIA" in the last 168 hours.  ABG No results found for: "PHART", "PCO2ART", "PO2ART", "HCO3", "TCO2", "ACIDBASEDEF", "O2SAT"   Coagulation Profile: No results for input(s): "INR", "PROTIME" in the last 168 hours.  Cardiac Enzymes: No results for input(s): "CKTOTAL",  "CKMB", "CKMBINDEX", "TROPONINI" in the last 168 hours.  HbA1C: No results found for: "HGBA1C"  CBG: No results for input(s): "GLUCAP" in the last 168 hours.     Critical care time: 35 minutes    Melody Comas, MD [facility] Office: [phone]   See Amion for personal pager PCCM on call pager [phone] until 7pm. Please call Elink 7p-7a. [phone]

## 2023-06-28 NOTE — Progress Notes (Signed)
 eLink Physician-Brief Progress Note Patient Name: Lauren Salazar DOB: [birth year] MRN: [id number]   Date of Service  06/28/2023  HPI/Events of Note  Potassium 3.3, creatinine 0.79, GFR >60. Patient has port and taking po  eICU Interventions  Initiate electrolyte replacement protocol     Intervention Category Intermediate Interventions: Electrolyte abnormality - evaluation and management  Rosalie Gums Havilah Topor 06/28/2023, 4:59 AM

## 2023-06-28 NOTE — Plan of Care (Signed)
  Problem: Education: Goal: Knowledge of General Education information will improve Description: Including pain rating scale, medication(s)/side effects and non-pharmacologic comfort measures Outcome: Progressing   Problem: Health Behavior/Discharge Planning: Goal: Ability to manage health-related needs will improve Outcome: Progressing   Problem: Clinical Measurements: Goal: Ability to maintain clinical measurements within normal limits will improve Outcome: Progressing Goal: Will remain free from infection Outcome: Progressing Goal: Diagnostic test results will improve Outcome: Progressing Goal: Respiratory complications will improve Outcome: Progressing Goal: Cardiovascular complication will be avoided Outcome: Progressing   Problem: Activity: Goal: Risk for activity intolerance will decrease Outcome: Progressing   Problem: Nutrition: Goal: Adequate nutrition will be maintained Outcome: Progressing   Problem: Coping: Goal: Level of anxiety will decrease Outcome: Progressing   Problem: Elimination: Goal: Will not experience complications related to bowel motility Outcome: Progressing Goal: Will not experience complications related to urinary retention Outcome: Progressing   Problem: Pain Managment: Goal: General experience of comfort will improve and/or be controlled Outcome: Progressing   Problem: Safety: Goal: Ability to remain free from injury will improve Outcome: Progressing   Problem: Skin Integrity: Goal: Risk for impaired skin integrity will decrease Outcome: Progressing  Cindy S. Clelia Croft BSN, RN, CCRP, CCRN 06/28/2023 1:28 AM

## 2023-06-28 NOTE — Progress Notes (Signed)
 CPT held at this time due to pt being in the middle of her lunch.

## 2023-06-28 NOTE — TOC Progression Note (Signed)
 Transition of Care [facility]) - Progression Note    Patient Details  Name: Lauren Salazar MRN: [id number] Date of Birth: 1961-04-07  Transition of Care [facility]) CM/SW Contact  Howell Rucks, RN Phone Number: 06/28/2023, 12:17 PM  Clinical Narrative:   Teams chat received from  critical  care NP , patient requesting information on meals services and home 02. Resources for meals services-Social Services Guide added to AVS, informed pt will need ambulation 02 test to  meet criteria for home 02.     Expected Discharge Plan: Home/Self Care Barriers to Discharge: Continued Medical Work up  Expected Discharge Plan and Services In-house Referral: Clinical Social Work     Living arrangements for the past 2 months: Single Family Home                                       Social Determinants of Health (SDOH) Interventions SDOH Screenings   Food Insecurity: No Food Insecurity (06/26/2023)  Housing: Low Risk  (06/26/2023)  Transportation Needs: No Transportation Needs (06/26/2023)  Utilities: Not At Risk (06/26/2023)  Tobacco Use: High Risk (06/25/2023)    Readmission Risk Interventions     No data to display

## 2023-06-28 NOTE — Plan of Care (Signed)
  Problem: Education: Goal: Knowledge of General Education information will improve Description: Including pain rating scale, medication(s)/side effects and non-pharmacologic comfort measures Outcome: Progressing   Problem: Clinical Measurements: Goal: Ability to maintain clinical measurements within normal limits will improve Outcome: Progressing Goal: Diagnostic test results will improve Outcome: Progressing   Problem: Coping: Goal: Level of anxiety will decrease Outcome: Progressing   Problem: Elimination: Goal: Will not experience complications related to bowel motility Outcome: Progressing   Problem: Skin Integrity: Goal: Risk for impaired skin integrity will decrease Outcome: Progressing

## 2023-06-28 NOTE — Consult Note (Signed)
 Palliative Medicine Inpatient Consult Note  Consulting Provider:  Alberteen Sam, MD   Reason for consult:   Palliative Care Consult Services Palliative Medicine Consult  Reason for Consult? 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   Palliative care has been asked to get involved to support additional goals of care conversations. She has been established with the OP Palliative Cancer clinic since 07/12/2022.   Clinical Assessment/Goals of Care:  *Please note that this is a verbal dictation therefore any spelling or grammatical errors are due to the "Dragon Medical One" system interpretation.  I have reviewed medical records including EPIC notes, labs and imaging, received report from bedside RN, assessed the patient who is lying in bed in NAD.    I met with Lauren Salazar to further discuss diagnosis prognosis, GOC, EOL wishes, disposition and options.   I introduced Palliative Medicine as specialized medical care for people living with serious illness. It focuses on providing relief from the symptoms and stress of a serious illness. The goal is to improve quality of life for both the patient and the family.  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 Patient is also status post robotic assisted laparoscopic cystectomy and pelvic exoneration including hysterectomy, bilateral oophorectomy and ileoconduit diversion.   Social History:  Lauren Salazar lives in the home with her son and DIL.  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  She also has a daughter who is involved in her care.   Functional  and Nutritional State:  Patient is independent of all ADLs. Is able to continue to drive from time to time. She does have worsening fatigue and is for the most part limited to being in her home watching TV. She lives with her son and daughter in Social worker.  Her appetite is limited by the ability of her son and his wife to provide food. She shares she often does not eat a meal until around 3-4 in the afternoon. She requests meal services.   Palliative Symptoms:  Pain: Patient feels that this is well controlled in the setting of norco.   Shortness of breath in the setting of pneumonia. She feels stable on supplemental oxygen.   Advance Directives:  A detailed discussion was had today regarding advanced directives.  Lauren Salazar shares that she does have advanced directives. She notes that her daughter, Lauren Salazar is her Runner, broadcasting/film/video.   Code Status:  Concepts specific to code status, artifical feeding and hydration, continued IV antibiotics and rehospitalization was had.  The difference between a aggressive medical intervention path  and a palliative comfort care path for this patient at this time was had.   I was honest in sharing that cardiopulmonary resuscitation can be very traumatic to the body. I shared it is important to discuss with her family in regards to whether or not this modality of care would be desired. As of present, Lauren Salazar states that she would resuscitative efforts made if needed. She notes that she would not want to be on life supportive measures indefinitely.   Provided  "Hard Choices for [employer]" booklet, a handout on CPR,  and a handout on the differences between Palliative and Hospice services.   Discussion:  Lauren Salazar is aware of her present health. She shares that she understands she went in to respiratory failure due to her pneumonia. She notes that she is aware that her pneumonia is severe. She states that she was in her usual health able to perform all bADLs  on her own. She shares that she went out to eat on Saturday night with friends and became more short of breath thereafter.  Lauren Salazar states that she is not presently getting treatment for her cancer though she is trying to work on her nutrition and strength to start this again.   Lauren Salazar notes that she has a great deal of quality in her life as she remains able to care for herself for the most part. She shares that she wants to continue living as she was prior to hospitalization - this is her goal at this time.   Patient requests help with meal services, a bath aide, and oxygen when her discharge gets closer.   Discussed the importance of continued conversation with family and their  medical providers regarding overall plan of care and treatment options, ensuring decisions are within the context of the patients values and GOCs. __________________________ Addendum:  I had a long conversations with patients daughter, Lauren Salazar. We reviewed that Lauren Salazar is in her bed much of the day. She has progressively gotten weaker over time. She is able to get up and go do things incrementally if she gets a burst of energy however she tends to be exhausted for a prolonged period of time thereafter.   We discussed the importance of goals of care meetings at this time. Lauren Salazar agrees as she wants to gain a better understanding of what her mothers wishes are. I shared openly and honestly that resuscitation for her mother would be greatly burdensome. She agrees and notes that her mother has oscillated about this as well. I shared the goal of identifying the facts associated with these measures as related to patients current cancer which as this time is not treatable.   We plan to meet tomorrow for further conversations. Lauren Salazar will try to obtain a copy of patients living will.   Decision Maker: Lauren Salazar,Lauren Salazar (Daughter): [phone] (Mobile)   SUMMARY OF RECOMMENDATIONS    Full code/ Full scope of care -  provided resources and asked that she speak to her family more about this considering the likely trauma it would cause  Patient has advanced directives - a copy of these have been requested  Patient shares the goals of improvement - She has fair knowledge of her disease burden  Appreciate TOC helping arrange meals on discharge  Ongoing PMT support  Code Status/Advance Care Planning: FULL CODE  Palliative Prophylaxis:  Aspiration, Bowel Regimen, Delirium Protocol, Frequent Pain Assessment, Oral Care, Palliative Wound Care, and Turn Reposition  Additional Recommendations (Limitations, Scope, Preferences): Continue current care  Psycho-social/Spiritual:  Desire for further Chaplaincy support: Yes - faith is very important to her Additional Recommendations: Education on PNA  Prognosis: Defer to oncology though there are no more treatments to be offered therefore it is likely that months or less is a reasonable estimate.  Discharge Planning: Discharge once medically optimized.   Vitals:   06/28/23 1126 06/28/23 1148  BP: 122/75 137/79  Pulse: (!) 109 (!) 107  Resp: (!) 22 (!) 26  Temp: 98 F (36.7 C) 98.1 F (36.7 C)  SpO2: 96% 92%    Intake/Output Summary (  Last 24 hours) at 06/28/2023 1219 Last data filed at 06/28/2023 1200 Gross per 24 hour  Intake 937.61 ml  Output 1755 ml  Net -817.39 ml   Last Weight  Most recent update: 06/26/2023  1:49 AM    Weight  50 kg (110 lb 3.7 oz)            Gen:  Elderly Caucasian F in moderate distress HEENT: moist mucous membranes CV: Irregular rate and rhythm  PULM: On 50LPM/80% FiO2, breathing is slightly labored ABD: soft/nontender  EXT: No edema  Neuro: Alert and oriented x3   PPS: 30% at this time   This conversation/these recommendations were discussed with patient primary care team, Dr. Maryfrances Bunnell  Total Time: 75 Billing based on MDM: High ______________________________________________________ Lamarr Lulas [facility] Palliative Medicine Team Team Cell Phone: [phone] Please utilize secure chat with additional questions, if there is no response within 30 minutes please call the above phone number  Palliative Medicine Team providers are available by phone from 7am to 7pm daily and can be reached through the team cell phone.  Should this patient require assistance outside of these hours, please call the patient's attending physician.

## 2023-06-29 DIAGNOSIS — J189 Pneumonia, unspecified organism: Secondary | ICD-10-CM | POA: Diagnosis not present

## 2023-06-29 DIAGNOSIS — R918 Other nonspecific abnormal finding of lung field: Secondary | ICD-10-CM

## 2023-06-29 DIAGNOSIS — R6521 Severe sepsis with septic shock: Secondary | ICD-10-CM | POA: Diagnosis not present

## 2023-06-29 DIAGNOSIS — J9601 Acute respiratory failure with hypoxia: Secondary | ICD-10-CM | POA: Diagnosis not present

## 2023-06-29 DIAGNOSIS — A419 Sepsis, unspecified organism: Secondary | ICD-10-CM | POA: Diagnosis not present

## 2023-06-29 DIAGNOSIS — C3430 Malignant neoplasm of lower lobe, unspecified bronchus or lung: Secondary | ICD-10-CM

## 2023-06-29 DIAGNOSIS — Z7189 Other specified counseling: Secondary | ICD-10-CM | POA: Diagnosis not present

## 2023-06-29 DIAGNOSIS — J90 Pleural effusion, not elsewhere classified: Secondary | ICD-10-CM

## 2023-06-29 DIAGNOSIS — Z515 Encounter for palliative care: Secondary | ICD-10-CM | POA: Diagnosis not present

## 2023-06-29 LAB — CBC
HCT: 31.8 % — ABNORMAL LOW (ref 36.0–46.0)
Hemoglobin: 9.3 g/dL — ABNORMAL LOW (ref 12.0–15.0)
MCH: 24.8 pg — ABNORMAL LOW (ref 26.0–34.0)
MCHC: 29.2 g/dL — ABNORMAL LOW (ref 30.0–36.0)
MCV: 84.8 fL (ref 80.0–100.0)
Platelets: 217 10*3/uL (ref 150–400)
RBC: 3.75 MIL/uL — ABNORMAL LOW (ref 3.87–5.11)
RDW: 21.1 % — ABNORMAL HIGH (ref 11.5–15.5)
WBC: 12.7 10*3/uL — ABNORMAL HIGH (ref 4.0–10.5)
nRBC: 0 % (ref 0.0–0.2)

## 2023-06-29 LAB — BPAM RBC
Blood Product Expiration Date: 202503212359
Blood Product Expiration Date: 202503282359
ISSUE DATE / TIME: 202503030844
ISSUE DATE / TIME: 202503051126
Unit Type and Rh: 5100
Unit Type and Rh: 9500

## 2023-06-29 LAB — LEGIONELLA PNEUMOPHILA SEROGP 1 UR AG: L. pneumophila Serogp 1 Ur Ag: NEGATIVE

## 2023-06-29 LAB — BASIC METABOLIC PANEL
Anion gap: 11 (ref 5–15)
BUN: 22 mg/dL (ref 8–23)
CO2: 23 mmol/L (ref 22–32)
Calcium: 8.3 mg/dL — ABNORMAL LOW (ref 8.9–10.3)
Chloride: 106 mmol/L (ref 98–111)
Creatinine, Ser: 0.87 mg/dL (ref 0.44–1.00)
GFR, Estimated: >60 mL/min (ref >60)
Glucose, Bld: 124 mg/dL — ABNORMAL HIGH (ref 70–99)
Potassium: 3.3 mmol/L — ABNORMAL LOW (ref 3.5–5.1)
Sodium: 140 mmol/L (ref 135–145)

## 2023-06-29 LAB — TYPE AND SCREEN
ABO/RH(D): A POS
Antibody Screen: NEGATIVE
Donor AG Type: NEGATIVE
Donor AG Type: NEGATIVE
Unit division: 0
Unit division: 0

## 2023-06-29 LAB — CULTURE, RESPIRATORY W GRAM STAIN

## 2023-06-29 MED ORDER — POTASSIUM CHLORIDE CRYS ER 20 MEQ PO TBCR
40.0000 meq | EXTENDED_RELEASE_TABLET | Freq: Once | ORAL | Status: AC
  Administered 2023-06-29: 40 meq via ORAL
  Filled 2023-06-29: qty 2

## 2023-06-29 MED ORDER — GUAIFENESIN-CODEINE 100-10 MG/5ML PO SOLN
10.0000 mL | Freq: Four times a day (QID) | ORAL | Status: DC | PRN
  Administered 2023-07-04 – 2023-07-10 (×11): 10 mL via ORAL
  Filled 2023-06-29 (×12): qty 10

## 2023-06-29 MED ORDER — GUAIFENESIN-DM 100-10 MG/5ML PO SYRP
5.0000 mL | ORAL_SOLUTION | ORAL | Status: DC | PRN
  Administered 2023-06-29 – 2023-07-06 (×6): 5 mL via ORAL
  Filled 2023-06-29 (×6): qty 10

## 2023-06-29 MED ORDER — GUAIFENESIN 200 MG PO TABS: 200.0000 mg | ORAL_TABLET | ORAL | Status: DC | PRN

## 2023-06-29 NOTE — Progress Notes (Signed)
  Progress Note   Patient: Lauren Salazar ZOX:[id number] DOB: [birth year] DOA: 06/25/2023     4 DOS: the patient was seen and examined on 06/29/2023 at 8:32 AM      Brief hospital course: 63 y.o. 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        Assessment and Plan: * Septic shock due to pneumonia Necrotizing right lower lobe lung mass, organizing right pleural effusion  No clinical improvement - Continue Zosyn - Follow-up sputum culture, Legionella - Continue ICS/LAMA/LABA, hypertonic nebs  Iron deficiency anemia Transfused 3/3 and 3/5.  Stable today. - Continue oral iron  Pressure injury of skin Of the sacrum and right buttock, unstageable, present on admission - Consult wound care  Lung cancer metastatic to bone and adrenals See above  Acute respiratory failure with hypoxia due to pneumonia See above       Subjective: No clinical change.  Still coughing frequently, cough is productive.  She has had no confusion, fever, nausea.     Physical Exam: BP 119/68   Pulse (!) 116   Temp 98.3 F (36.8 C) (Oral)   Resp (!) 39   Ht 5\' 6"  (1.676 m)   Wt 50 kg   SpO2 90%   BMI 17.79 kg/m   Elderly adult female, sitting up in bed, on heated high flow, no obvious distress, interactive Tachycardic, regular, no murmurs, no peripheral edema, no JVD Lungs coarse bilaterally, diminished in the right base Abdomen soft, no tenderness to palpation Attention normal, affect normal, judgment and insight appear normal, face symmetric, speech fluent, generalized weakness but symmetric strength    Data Reviewed: My blood cell count down to 12, hemoglobin up to 9.3 Basic metabolic panel shows potassium 3.3, otherwise normal  Family Communication:    Disposition: Status is: Inpatient         Author: Alberteen Sam,  MD 06/29/2023 12:46 PM  For on call review [email or web address].

## 2023-06-29 NOTE — Plan of Care (Signed)

## 2023-06-29 NOTE — Plan of Care (Signed)
  Problem: Education: Goal: Knowledge of General Education information will improve Description: Including pain rating scale, medication(s)/side effects and non-pharmacologic comfort measures Outcome: Progressing   Problem: Health Behavior/Discharge Planning: Goal: Ability to manage health-related needs will improve Outcome: Progressing   Problem: Clinical Measurements: Goal: Ability to maintain clinical measurements within normal limits will improve Outcome: Progressing Goal: Will remain free from infection Outcome: Progressing Goal: Diagnostic test results will improve Outcome: Progressing Goal: Cardiovascular complication will be avoided Outcome: Progressing   Problem: Nutrition: Goal: Adequate nutrition will be maintained Outcome: Progressing   Problem: Coping: Goal: Level of anxiety will decrease Outcome: Progressing   Problem: Elimination: Goal: Will not experience complications related to bowel motility Outcome: Progressing Goal: Will not experience complications related to urinary retention Outcome: Progressing   Problem: Pain Managment: Goal: General experience of comfort will improve and/or be controlled Outcome: Progressing   Problem: Safety: Goal: Ability to remain free from injury will improve Outcome: Progressing   Problem: Skin Integrity: Goal: Risk for impaired skin integrity will decrease Outcome: Progressing

## 2023-06-29 NOTE — Consult Note (Signed)
 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 She has had increasing requirements oxygen requirements and transferred to the ICU. PCCM consulted today due to the increasing oxygen requirements.   CT Chest scan with contrast performed to evaluate pleural effusion noted on CT Chest scan 05/25/23. Today's CT is notable for enlarging mediastinal lymph nodes, subcarinal node mass and progressive left axillary and subpectoral adenopathy. Heterogeneous enhancing mass in the right lower lobe measuring 6.8 x 4.7cm with increased necrosis. The mass communicates with an increasingly organized right pleural fluid collection posteriorly with increased air within this collection.   She is having productive cough. Reports having issues over the past month with concern for pneumonia being treated as outpatient. She is very short of breath. Denies hemoptysis.  Pertinent  Medical History   Past Medical History:  Diagnosis Date   Bladder tumor    Cancer (HCC)    Complication of anesthesia    slow to wake after spinal fusion, then was "looney" for the following 24 hours    Persistent dry cough    ongoing since feb 2020   Significant Hospital Events: Including procedures, antibiotic start and stop dates in addition to other pertinent events   3/2 admitted 3/3 transferred to ICU, placed on HHFNC 3/4 remained on HHFNC, bedside US showed small right effusion not amenable to thoracentesis 3/5 remains on HHFNC, WBC trending down, afebrile  Interim History / Subjective:   No acute events overnight She did not sleep  well   Objective   Blood pressure 119/77, pulse (!) 111, temperature 97.8 F (36.6 C), temperature source Oral, resp. rate (!) 33, height 5\' 6"  (1.676 m), weight 50 kg, SpO2 97%.    FiO2 (%):  [55 %-90 %] 85 %   Intake/Output Summary (Last 24 hours) at 06/29/2023 0732 Last data filed at 06/29/2023 4098 Gross per 24 hour  Intake 1709.88 ml  Output 1100 ml  Net 609.88 ml   Filed Weights   06/26/23 0100  Weight: 50 kg    Examination: General: elderly woman, no acute distress, productive cough HENT: [place]/AT, moist mucous membranes Lungs: course breath sounds, diminished right base Cardiovascular: tachycardic, no murmurs Abdomen: soft, BS+ Extremities: warm, no edema Neuro: alert, moving all extremities GU: n/a  CT Chest w/ contrast 06/27/23 1. Increased necrosis within the right lower lobe mass with increased organization of the adjacent right pleural fluid collection. There is increased air within this collection and increased peripheral enhancement, suspicious for empyema. 2. Increased consolidation and cavitation in the right middle lobe. New moderate-sized dependent left pleural effusion with new patchy airspace disease dependently in the left upper and lower lobes, most consistent with pneumonia. 3. Progressive metastatic disease with enlarging mediastinal and left axillary/subpectoral lymph nodes, enlarging necrotic right lower lobe mass and enlarging bilateral adrenal metastases. Possible tumor extension into the IVC from the right adrenal gland. Mildly progressive metastasis involving the posterior aspect of the left 11th rib. 4. Aortic Atherosclerosis (ICD10-I70.0) and Emphysema (ICD10-J43.9).  EKG 3/5: sinus tach  3/4 sputum culture: gram positive rods and gram positive cocci in chains  Resolved Hospital Problem list  Assessment & Plan:  Acute Hypoxemic Respiratory Failure Sepsis due to Pneumonia Emphysema/COPD Right lower lobe necrotizing lung mass Right  complicated pleural effusion concern for empyema Metastatic non-small cell lung cancer to bone and adrenal glands Anemia  Plan: - continue zosyn for pneumonia coverage - f/u sputum culture, urine legionella ag - continue heated high flow nasal canula for SpO2 goal 90-92% or higher - continue brovana and yupelri nebs - flutter valve and chest vest for airway clearance - continue hypertonic nebs today  Best Practice (right click and "Reselect all SmartList Selections" daily)   Per primary  Labs   CBC: Recent Labs  Lab 06/25/23 1024 06/26/23 0317 06/26/23 1231 06/27/23 0253 06/28/23 0248 06/29/23 0303  WBC 40.2* 21.8*  --  21.0* 14.6* 12.7*  NEUTROABS 38.5*  --   --   --   --   --   HGB 8.3* 6.4* 8.0* 8.7* 7.7* 9.3*  HCT 30.7* 24.1* 27.0* 29.4* 26.7* 31.8*  MCV 82.5 84.9  --  83.5 84.8 84.8  PLT 348 207  --  243 220 217    Basic Metabolic Panel: Recent Labs  Lab 06/25/23 1024 06/26/23 0317 06/27/23 0253 06/28/23 0248 06/29/23 0303  NA 131* 136 132* 136 140  K 4.1 4.4 3.5 3.3* 3.3*  CL 97* 106 102 103 106  CO2 20* 21* 22 23 23   GLUCOSE 243* 126* 83 106* 124*  BUN 49* 35* 26* 23 22  CREATININE 1.04* 0.93 0.72 0.79 0.87  CALCIUM 8.3* 7.9* 7.6* 7.7* 8.3*   GFR: Estimated Creatinine Clearance: 51.6 mL/min (by C-G formula based on SCr of 0.87 mg/dL). Recent Labs  Lab 06/25/23 1038 06/25/23 1308 06/25/23 1645 06/26/23 0317 06/26/23 1354 06/27/23 0253 06/28/23 0248 06/29/23 0303  WBC  --   --   --  21.8*  --  21.0* 14.6* 12.7*  LATICACIDVEN 4.1* 5.9* 5.6*  --  1.6  --   --   --     Liver Function Tests: Recent Labs  Lab 06/25/23 1024 06/27/23 0253  AST 23 24  ALT 21 19  ALKPHOS 85 82  BILITOT 0.5 0.6  PROT 6.3* 5.6*  ALBUMIN 2.1* 1.8*   No results for input(s): "LIPASE", "AMYLASE" in the last 168 hours. No results for input(s): "AMMONIA" in the last 168 hours.  ABG No results found for: "PHART", "PCO2ART", "PO2ART", "HCO3", "TCO2",  "ACIDBASEDEF", "O2SAT"   Coagulation Profile: No results for input(s): "INR", "PROTIME" in the last 168 hours.  Cardiac Enzymes: No results for input(s): "CKTOTAL", "CKMB", "CKMBINDEX", "TROPONINI" in the last 168 hours.  HbA1C: No results found for: "HGBA1C"  CBG: No results for input(s): "GLUCAP" in the last 168 hours.     Critical care time: 32 minutes    Melody Comas, MD [facility] Office: [phone]   See Amion for personal pager PCCM on call pager [phone] until 7pm. Please call Elink 7p-7a. [phone]

## 2023-06-29 NOTE — Progress Notes (Signed)
 Palliative Medicine Progress Note   Patient Name: Lauren Salazar       Date: 06/29/2023 DOB: [birth year]  Age: 63 y.o. 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    Palliative care has been asked to get involved to support additional goals of care conversations. She has been established with the OP Palliative Cancer clinic since 07/12/2022  Subjective: Extensive chart review has been completed including labs, vital signs, imaging, progress/consult notes, orders, medications and available advance directive documents.   I met with patient and her daughter Lauren Salazar at bedside today to discuss goals of care.   We reviewed her current clinical condition. She reports feeling better overall. She is on 50 L HFNC.   We reviewed her last oncology visit (06/13/23). She reports initially feeling upset due to the perception "they were giving up". With further explanation from the oncology team, she was able understand why chemotherapy is currently on hold in the setting of ongoing weakness and infection.   Discussed the "what ifs" in the event her condition were to deteriorate, keeping in mind the concept of quality of life. We did discuss code status. Encouraged consideration of DNR/DNI status understanding evidenced based poor outcomes in similar hospitalized patients, as the cause of the arrest is likely associated with chronic/terminal disease rather than a reversible  acute cardio-pulmonary event.  Patient is adamant to remain full code, however she does indicate she would not want to be on life support long-term if things were not improving.  For now, she wants to focus on the "positive instead of the negative", and take things "one day at a time". She remains hopeful that the pneumonia will improve, and that she will be able to regain strength. Her ultimate goal is to resume cancer treatment. She wants to continue living as long as possible for her grandchildren.   Discussed the importance of continued conversation with the medical team regarding overall plan of care and treatment options.   Objective:  Physical Exam Vitals reviewed.  Constitutional:  General: She is not in acute distress.    Appearance: She is ill-appearing.  Pulmonary:     Effort: No respiratory distress.  Neurological:     Mental Status: She is alert and oriented to person, place, and time.             Palliative Medicine Assessment & Plan   Assessment: Principal Problem:   Septic shock (HCC) Active Problems:   Lung cancer (HCC)   Pressure injury of skin   Iron deficiency anemia   Acute respiratory failure with hypoxia (HCC)    Recommendations/Plan: Full scope of care Patient's goal is for medical stabilization and improvement Ongoing palliative support  Advanced Directives - a copy of these has been requested; daughter/Lauren Salazar is HCPOA  Code Status: Full code - this was discussed in detail today with patient and daughter  Prognosis:  Defer to oncology  Discharge Planning: To Be Determined   Thank you for allowing the Palliative Medicine Team to assist in the care of this patient.   Time: 70 minutes  Detailed review of medical records (labs, imaging, vital signs), medically appropriate exam, discussed with treatment team, counseling and education to patient, family, & staff, documenting clinical information, coordination of care.   Merry Proud, NP   Please contact Palliative Medicine Team phone at [phone] for questions and concerns.  For individual providers, please see AMION.

## 2023-06-29 NOTE — Progress Notes (Signed)
 PT refused 1600 CPT- stated she needed to sleep (no rest last night). RN aware.

## 2023-06-30 DIAGNOSIS — J449 Chronic obstructive pulmonary disease, unspecified: Secondary | ICD-10-CM | POA: Diagnosis not present

## 2023-06-30 DIAGNOSIS — A419 Sepsis, unspecified organism: Secondary | ICD-10-CM | POA: Diagnosis not present

## 2023-06-30 DIAGNOSIS — J9601 Acute respiratory failure with hypoxia: Secondary | ICD-10-CM | POA: Diagnosis not present

## 2023-06-30 DIAGNOSIS — J189 Pneumonia, unspecified organism: Secondary | ICD-10-CM | POA: Diagnosis not present

## 2023-06-30 DIAGNOSIS — R6521 Severe sepsis with septic shock: Secondary | ICD-10-CM | POA: Diagnosis not present

## 2023-06-30 LAB — CULTURE, BLOOD (ROUTINE X 2)
Culture: NO GROWTH
Culture: NO GROWTH
Special Requests: ADEQUATE
Special Requests: ADEQUATE

## 2023-06-30 LAB — BASIC METABOLIC PANEL
Anion gap: 10 (ref 5–15)
BUN: 23 mg/dL (ref 8–23)
CO2: 25 mmol/L (ref 22–32)
Calcium: 8.1 mg/dL — ABNORMAL LOW (ref 8.9–10.3)
Chloride: 105 mmol/L (ref 98–111)
Creatinine, Ser: 0.88 mg/dL (ref 0.44–1.00)
GFR, Estimated: >60 mL/min (ref >60)
Glucose, Bld: 83 mg/dL (ref 70–99)
Potassium: 3.6 mmol/L (ref 3.5–5.1)
Sodium: 140 mmol/L (ref 135–145)

## 2023-06-30 LAB — CBC
HCT: 28.9 % — ABNORMAL LOW (ref 36.0–46.0)
Hemoglobin: 8.1 g/dL — ABNORMAL LOW (ref 12.0–15.0)
MCH: 25 pg — ABNORMAL LOW (ref 26.0–34.0)
MCHC: 28 g/dL — ABNORMAL LOW (ref 30.0–36.0)
MCV: 89.2 fL (ref 80.0–100.0)
Platelets: 231 10*3/uL (ref 150–400)
RBC: 3.24 MIL/uL — ABNORMAL LOW (ref 3.87–5.11)
RDW: 21.7 % — ABNORMAL HIGH (ref 11.5–15.5)
WBC: 11.3 10*3/uL — ABNORMAL HIGH (ref 4.0–10.5)
nRBC: 0 % (ref 0.0–0.2)

## 2023-06-30 MED ORDER — ACETAMINOPHEN 500 MG PO TABS
1000.0000 mg | ORAL_TABLET | Freq: Every day | ORAL | Status: DC
  Filled 2023-06-30 (×5): qty 2

## 2023-06-30 MED ORDER — ALUM & MAG HYDROXIDE-SIMETH 200-200-20 MG/5ML PO SUSP: 30.0000 mL | ORAL | Status: DC | PRN

## 2023-06-30 MED ORDER — SIMETHICONE 80 MG PO CHEW
80.0000 mg | CHEWABLE_TABLET | Freq: Four times a day (QID) | ORAL | Status: DC | PRN
  Administered 2023-06-30 – 2023-07-02 (×2): 80 mg via ORAL
  Filled 2023-06-30 (×2): qty 1

## 2023-06-30 MED ORDER — MELATONIN 3 MG PO TABS
3.0000 mg | ORAL_TABLET | Freq: Every day | ORAL | Status: DC
Start: 2023-06-30 — End: 2023-07-10
  Administered 2023-06-30 – 2023-07-09 (×10): 3 mg via ORAL
  Filled 2023-06-30 (×10): qty 1

## 2023-06-30 MED ORDER — ALPRAZOLAM 0.5 MG PO TABS
0.5000 mg | ORAL_TABLET | Freq: Every day | ORAL | Status: DC | PRN
  Administered 2023-07-01 – 2023-07-06 (×5): 0.5 mg via ORAL
  Filled 2023-06-30 (×5): qty 1

## 2023-06-30 NOTE — Plan of Care (Signed)
   Problem: Clinical Measurements: Goal: Will remain free from infection Outcome: Progressing Goal: Diagnostic test results will improve Outcome: Progressing Goal: Respiratory complications will improve Outcome: Progressing   Problem: Coping: Goal: Level of anxiety will decrease Outcome: Progressing

## 2023-06-30 NOTE — Progress Notes (Signed)
  Progress Note   Patient: Lauren Salazar ZOX:[id number] DOB: [birth year] DOA: 06/25/2023     5 DOS: the patient was seen and examined on 06/30/2023 at 10:48AM      Brief hospital course: 63 y.o. 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   Significant events: 3/2: Admitted to SDU for sepsis 3/3: Escalated to HHFNC 40L; transfused 1u PRBCs 3/4: CT chest showed enlarging lung mass, communicating with complex pleural effusion, pulm consulted 3/5: Transfused second unit PRBCs 3/6: Still HHFNC 40L 3/7: Weaned to 30L    Significant studies: 3/4 CT chest: 6x4cm necrotic lung mass, progressive adenopathy, organized right pleural fluid collection   Significant microbiology data: 3/2 COVID/flu/RSV PCR: negative 3/2 Blood culture x2: NGTD 3/3 MRSA nares: negative 3/4 RVP PCR: negative 3/4 Sputum: Normal respiratory flora    Procedures:     Consults: CCM      Assessment and Plan: * Septic shock due to pneumonia Necrotizing right lower lobe lung mass, organizing right pleural effusion  Presented with leukocytosis, tachypnea, respiratory failure, and lactic acid 5.9 in the setting of pneumonia. - Continue Zosyn - Follow-up sputum culture, Legionella - Continue ICS/LAMA/LABA, hypertonic nebs  Iron deficiency anemia Transfused 2 units of 5 this hospitalization Hemoglobin down to 8.1 again. - Continue oral iron - Trend hemoglobin            Subjective: No clinical change, no nursing concerns.  She has some stomach gas.  Her breathing overall feels better.     Physical Exam: BP 114/78   Pulse (!) 111   Temp 97.9 F (36.6 C) (Oral)   Resp (!) 26   Ht 5\' 6"  (1.676 m)   Wt 50 kg   SpO2 97%   BMI 17.79 kg/m   Elderly female, sitting up in bed, heated high flow, no obvious distress Tachycardic, regular, no murmurs, no  peripheral edema Respiratory rate seems somewhat elevated, lung sounds are diminished and coarse bilaterally Abdomen soft no tenderness palpation Attention normal, affect normal, judgment and insight appear normal, generalized weakness but symmetric strength    Data Reviewed: Basic metabolic panel unremarkable Discussed with pulmonology PA CBC shows hemoglobin down to 8.1  Family Communication: Daughter by phone    Disposition: Status is: Inpatient         Author: Alberteen Sam, MD 06/30/2023 5:20 PM  For on call review [email or web address].

## 2023-06-30 NOTE — Progress Notes (Signed)
   06/30/23 0801  Oxygen Therapy/Pulse Ox  O2 Device HHFNC  O2 Flow Rate (L/min) 30 L/min  FiO2 (%) (S)  70 % (Weaned to 70%.)  SpO2 96 %

## 2023-06-30 NOTE — Progress Notes (Signed)
 Pharmacy Antibiotic Note  Lauren Salazar is a 63 y.o. female admitted on 06/25/2023 with pneumonia.  She was started on Ceftriaxone and Azithromycin on admission.  Pharmacy is now consulted for Zosyn dosing for necrotizing RLL lung mass.    Plan: Continue Zosyn 3.375g IV Q8H infused over 4hrs.  Pharmacy will sign off notes, but peripherally follow up renal function, culture results, and clinical course.   Height: 5\' 6"  (167.6 cm) Weight: 50 kg (110 lb 3.7 oz) IBW/kg (Calculated) : 59.3  Temp (24hrs), Avg:97.8 F (36.6 C), Min:97.5 F (36.4 C), Max:98.5 F (36.9 C)  Recent Labs  Lab 06/25/23 1024 06/25/23 1038 06/25/23 1308 06/25/23 1645 06/26/23 0317 06/26/23 1354 06/27/23 0253 06/28/23 0248 06/29/23 0303 06/30/23 0500  WBC 40.2*  --   --   --  21.8*  --  21.0* 14.6* 12.7*  --   CREATININE 1.04*  --   --   --  0.93  --  0.72 0.79 0.87 0.88  LATICACIDVEN  --  4.1* 5.9* 5.6*  --  1.6  --   --   --   --     Estimated Creatinine Clearance: 51 mL/min (by C-G formula based on SCr of 0.88 mg/dL).    Allergies  Allergen Reactions   Morphine Sulfate Other (See Comments)    hallucination   Celecoxib Palpitations and Other (See Comments)    Chest pain    Antimicrobials this admission:  PTA azithromycin >> last on 3/1 3/2 Azithromycin >> 3/6 3/2 Ceftriaxone >> 3/4 3/2 vancomycin x1 3/4 Zosyn >>   Microbiology results:  3/2 Resp panel: neg covid, flu, rsv 3/2 BCx: ngtd 3/3 MRSA PCR: not detected 3/4 RVP:  negative 3/4 Sputum: normal flora  Thank you for allowing pharmacy to be a part of this patient's care.  Lynann Beaver PharmD, BCPS [facility] main pharmacy [phone] 06/30/2023 10:56 AM

## 2023-06-30 NOTE — Progress Notes (Addendum)
 Patient expressed to this nurse that she is having a hard time sleeping and last night she started to panic due to the noises, the forceful air blowing in her face, staff coming into the room to look at her equipment, etc. and she felt like she was going to rip all of her equipment off and have a "fit." This nurse asked the patient if she would be willing to try a medication as needed when she feels overwhelmed/anxious. The patient stated she would like something for anxiety as needed and would also like to try melatonin for nighttime sleep assistance. This nurse sent this request to Dr. Maryfrances Bunnell for continuity of care.   See new orders from provider.

## 2023-06-30 NOTE — Consult Note (Signed)
 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 She has had increasing requirements oxygen requirements and transferred to the ICU. PCCM consulted today due to the increasing oxygen requirements.   CT Chest scan with contrast performed to evaluate pleural effusion noted on CT Chest scan 05/25/23. Today's CT is notable for enlarging mediastinal lymph nodes, subcarinal node mass and progressive left axillary and subpectoral adenopathy. Heterogeneous enhancing mass in the right lower lobe measuring 6.8 x 4.7cm with increased necrosis. The mass communicates with an increasingly organized right pleural fluid collection posteriorly with increased air within this collection.   She is having productive cough. Reports having issues over the past month with concern for pneumonia being treated as outpatient. She is very short of breath. Denies hemoptysis.  Pertinent  Medical History   Past Medical History:  Diagnosis Date   Bladder tumor    Cancer (HCC)    Complication of anesthesia    slow to wake after spinal fusion, then was "looney" for the following 24 hours    Persistent dry cough    ongoing since feb 2020   Significant Hospital Events: Including procedures, antibiotic start and stop dates in addition to other pertinent events   3/2 admitted 3/3 transferred to ICU, placed on HHFNC 3/4 remained on HHFNC, bedside US showed small right effusion not amenable to thoracentesis 3/5 remains on HHFNC, WBC trending down, afebrile  Interim History / Subjective:   Feeling much better than admission this morning.   Objective    Blood pressure (!) 112/59, pulse (!) 112, temperature 97.8 F (36.6 C), temperature source Oral, resp. rate (!) 23, height 5\' 6"  (1.676 m), weight 50 kg, SpO2 96%.    FiO2 (%):  [70 %-100 %] 70 %   Intake/Output Summary (Last 24 hours) at 06/30/2023 0943 Last data filed at 06/30/2023 0740 Gross per 24 hour  Intake 466.46 ml  Output 1250 ml  Net -783.54 ml   Filed Weights   06/26/23 0100  Weight: 50 kg    Examination: Elderly woman, thin, on HHFNC Breath sounds diminished right lung base Tachycardic, regular Extremities thin, no edema Normal speech  CT Chest 06/27/23 shows RLL mass with focal necrosis and patchy left upper and lower lobe pneumonia Bedside ultrasound of the right pleural space without significant effusion amenable to thoracentesis.  Blood cultures ngtd Sputum culture normal flora from 3/4 Respiratory viral panel negative Mrsa negative  Resolved Hospital Problem list     Assessment & Plan:  Acute Hypoxemic Respiratory Failure Sepsis due to Pneumonia Emphysema/COPD Right lower lobe necrotizing lung mass Right complicated pleural effusion concern for empyema Metastatic non-small cell lung cancer to bone and adrenal glands Anemia  Plan: - continue zosyn for pneumonia coverage. Suspect she has poor pulmonary reserve given underlying emphysema with RLL atelectasis from associated mass and now superimposed pneumonia on the right. - continue heated high flow nasal canula for SpO2 goal 90-92% or higher - suspect we can wean this down today - continue brovana and yupelri nebs - flutter valve - continue hypertonic nebs today with flutter valve   - would hold off on chest physiotherapy given absence  of bronchiectasis.  - full code  PCCM will follow  Durel Salts, MD Pulmonary and Critical Care Medicine [facility] 06/30/2023 9:46 AM Pager: see AMION  If no response to pager, please call critical care on call (see AMION) until 7pm After 7:00 pm call  Elink

## 2023-07-01 DIAGNOSIS — J189 Pneumonia, unspecified organism: Secondary | ICD-10-CM | POA: Diagnosis not present

## 2023-07-01 DIAGNOSIS — C3430 Malignant neoplasm of lower lobe, unspecified bronchus or lung: Secondary | ICD-10-CM | POA: Diagnosis not present

## 2023-07-01 DIAGNOSIS — Z515 Encounter for palliative care: Secondary | ICD-10-CM | POA: Diagnosis not present

## 2023-07-01 DIAGNOSIS — A419 Sepsis, unspecified organism: Secondary | ICD-10-CM | POA: Diagnosis not present

## 2023-07-01 DIAGNOSIS — J449 Chronic obstructive pulmonary disease, unspecified: Secondary | ICD-10-CM | POA: Diagnosis not present

## 2023-07-01 DIAGNOSIS — J9601 Acute respiratory failure with hypoxia: Secondary | ICD-10-CM | POA: Diagnosis not present

## 2023-07-01 LAB — COMPREHENSIVE METABOLIC PANEL
ALT: 13 U/L (ref 0–44)
AST: 15 U/L (ref 15–41)
Albumin: 1.5 g/dL — ABNORMAL LOW (ref 3.5–5.0)
Alkaline Phosphatase: 78 U/L (ref 38–126)
Anion gap: 8 (ref 5–15)
BUN: 23 mg/dL (ref 8–23)
CO2: 26 mmol/L (ref 22–32)
Calcium: 8.2 mg/dL — ABNORMAL LOW (ref 8.9–10.3)
Chloride: 106 mmol/L (ref 98–111)
Creatinine, Ser: 0.84 mg/dL (ref 0.44–1.00)
GFR, Estimated: >60 mL/min (ref >60)
Glucose, Bld: 75 mg/dL (ref 70–99)
Potassium: 3.4 mmol/L — ABNORMAL LOW (ref 3.5–5.1)
Sodium: 140 mmol/L (ref 135–145)
Total Bilirubin: 0.5 mg/dL (ref 0.0–1.2)
Total Protein: 5.2 g/dL — ABNORMAL LOW (ref 6.5–8.1)

## 2023-07-01 LAB — CBC
HCT: 30.2 % — ABNORMAL LOW (ref 36.0–46.0)
Hemoglobin: 8.6 g/dL — ABNORMAL LOW (ref 12.0–15.0)
MCH: 24.7 pg — ABNORMAL LOW (ref 26.0–34.0)
MCHC: 28.5 g/dL — ABNORMAL LOW (ref 30.0–36.0)
MCV: 86.8 fL (ref 80.0–100.0)
Platelets: 189 10*3/uL (ref 150–400)
RBC: 3.48 MIL/uL — ABNORMAL LOW (ref 3.87–5.11)
RDW: 21.5 % — ABNORMAL HIGH (ref 11.5–15.5)
WBC: 7.6 10*3/uL (ref 4.0–10.5)
nRBC: 0 % (ref 0.0–0.2)

## 2023-07-01 MED ORDER — ENSURE ENLIVE PO LIQD
237.0000 mL | Freq: Two times a day (BID) | ORAL | Status: DC
  Administered 2023-07-01 – 2023-07-10 (×11): 237 mL via ORAL

## 2023-07-01 MED ORDER — AYR SALINE NASAL NA GEL: 1.0000 | NASAL | Status: DC | PRN

## 2023-07-01 MED ORDER — SALINE SPRAY 0.65 % NA SOLN: 1.0000 | NASAL | Status: DC | PRN

## 2023-07-01 NOTE — Progress Notes (Signed)
 Palliative Medicine Progress Note   Patient Name: Lauren Salazar       Date: 07/01/2023 DOB: 10-Apr-1961  Age: 63 y.o. 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    Palliative care has been asked to get involved to support additional goals of care conversations. She has been established with the OP Palliative Cancer clinic since 07/12/2022  Subjective:   Objective:  Physical Exam          Vital Signs: BP 116/77 (BP Location: Left Arm)   Pulse 100   Temp 98.3 F (36.8 C) (Oral)   Resp (!) 27   Ht 5\' 6"  (1.676 m)   Wt 50 kg   SpO2 98%   BMI 17.79 kg/m  SpO2: SpO2: 98 % O2 Device: O2 Device: High Flow Nasal Cannula O2 Flow Rate: O2 Flow Rate (L/min): 5 L/min  Intake/output summary:  Intake/Output Summary (Last 24 hours) at 07/01/2023 1820 Last data filed at 07/01/2023 2130 Gross per 24 hour  Intake 142.02 ml  Output 525 ml  Net -382.98 ml    LBM: Last BM Date : 07/01/23     Palliative Assessment/Data: ***     Palliative Medicine Assessment & Plan   Assessment: Principal Problem:   Septic shock due to pneumonia Active Problems:   Lung cancer (HCC)   Pressure injury of skin   Iron deficiency anemia   Acute hypoxemic respiratory failure (HCC)    Recommendations/Plan: Full scope of care Patient's goal is for medical stabilization and improvement Ongoing  palliative support  Goals of Care and Additional Recommendations: Limitations on Scope of Treatment: {Recommended Scope and Preferences:21019}  Code Status:   Prognosis:  {Palliative Care Prognosis:23504}  Discharge Planning: {Palliative dispostion:23505}  Care plan was discussed with ***  Thank you for allowing the Palliative Medicine Team to assist in the care of this patient.   ***   Merry Proud, NP   Please contact Palliative Medicine Team phone at [phone] for questions and concerns.  For individual providers, please see AMION.

## 2023-07-01 NOTE — Progress Notes (Signed)
  Progress Note   Patient: Lauren Salazar WJX:[id number] DOB: June 03, 1960 DOA: 06/25/2023     6 DOS: the patient was seen and examined on 07/01/2023 at 8:43AM      Brief hospital course: 63 y.o. 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        Assessment and Plan: * Septic shock due to pneumonia Necrotizing right lower lobe lung mass, organizing right pleural effusion  No significant progress weaning oxygen overnight.  Sputum culture normal respiratory flora. - Continue Zosyn - Continue ICS/LABA/LAMA/hypertonic saline -Consult pulmonology   Iron deficiency anemia Thankfully hemoglobin stable today - Continue oral iron             Subjective: Patient has nosebleed overnight, she started to wear the oxygen in her nose, she is overall tired.  She has no new complaints, she is not significantly better.  She is still on 30 L high flow nasal cannula.     Physical Exam: BP 125/70 (BP Location: Left Arm)   Pulse 92   Temp 98.2 F (36.8 C) (Oral)   Resp (!) 25   Ht 5\' 6"  (1.676 m)   Wt 50 kg   SpO2 100%   BMI 17.79 kg/m   Elderly adult female, lying in bed, tired, epistaxis noted on the tissue, no blood from the nose Tachycardic, regular, edema in the right arm Respiratory rate seems okay on the high flow nasal cannula, lungs very diminished on the right, rales on the left Abdomen soft, no tenderness to palpation Clubbing noted of fingers on both hands, unchanged from previous Attention normal, affect normal, judgment and insight appear normal, generalized weakness but symmetric     Data Reviewed: Basic metabolic panel unremarkable CBC shows hemoglobin up to 8.4  Family Communication:     Disposition: Status is:  Inpatient         Author: Alberteen Sam, MD 07/01/2023 8:58 AM  For on call review [email or web address].

## 2023-07-01 NOTE — Progress Notes (Signed)
   07/01/23 1139  Oxygen Therapy/Pulse Ox  O2 Device HFNC  O2 Therapy Oxygen humidified (Safety valve checked, working properly, H20 bottle filled.)  O2 Flow Rate (L/min) (S)  5 L/min (Adjusted the low Sp02 alarm on the monitor to sat goal of 92% or higher.)  FiO2 (%) 40 %  SpO2 100 %  Safety Instructions Yes (Comment)

## 2023-07-01 NOTE — Progress Notes (Signed)
 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 She has had increasing requirements oxygen requirements and transferred to the ICU. PCCM consulted today due to the increasing oxygen requirements.   CT Chest scan with contrast performed to evaluate pleural effusion noted on CT Chest scan 05/25/23. Today's CT is notable for enlarging mediastinal lymph nodes, subcarinal node mass and progressive left axillary and subpectoral adenopathy. Heterogeneous enhancing mass in the right lower lobe measuring 6.8 x 4.7cm with increased necrosis. The mass communicates with an increasingly organized right pleural fluid collection posteriorly with increased air within this collection.   She is having productive cough. Reports having issues over the past month with concern for pneumonia being treated as outpatient. She is very short of breath. Denies hemoptysis.  Pertinent  Medical History   Past Medical History:  Diagnosis Date   Bladder tumor    Cancer (HCC)    Complication of anesthesia    slow to wake after spinal fusion, then was "looney" for the following 24 hours    Persistent dry cough    ongoing since feb 2020   Significant Hospital Events: Including procedures, antibiotic start and stop dates in addition to other pertinent events   3/2 admitted 3/3 transferred to ICU, placed on HHFNC 3/4 remained on HHFNC, bedside US showed small right effusion not amenable to thoracentesis 3/5 remains on HHFNC, WBC trending down, afebrile  Interim History / Subjective:   Had some epistaxis earlier.   Objective   Blood pressure  125/70, pulse 92, temperature 98.2 F (36.8 C), temperature source Oral, resp. rate (!) 25, height 5\' 6"  (1.676 m), weight 50 kg, SpO2 100%.    FiO2 (%):  [67 %-85 %] 85 %   Intake/Output Summary (Last 24 hours) at 07/01/2023 1113 Last data filed at 07/01/2023 0921 Gross per 24 hour  Intake 142.02 ml  Output 1225 ml  Net -1082.98 ml   Filed Weights   06/26/23 0100  Weight: 50 kg    Examination: Thin woman, on HHFNC, no respiratory distress Tachycardic, regular RR in 20s but able to speak in full sentences 100% SpO2 on 30LPM and 80% FiO2 No peripheral edema  CT Chest 06/27/23 shows RLL mass with focal necrosis and patchy left upper and lower lobe pneumonia Bedside ultrasound of the right pleural space without significant effusion amenable to thoracentesis.  Blood cultures ngtd Sputum culture normal flora from 3/4 Respiratory viral panel negative Mrsa negative  Resolved Hospital Problem list     Assessment & Plan:  Acute Hypoxemic Respiratory Failure Sepsis due to Pneumonia Emphysema/COPD Right lower lobe necrotizing lung mass Right complicated pleural effusion concern for empyema Metastatic non-small cell lung cancer to bone and adrenal glands Anemia Epistaxis  Plan: - continue zosyn for pneumonia coverage. Suspect she has poor pulmonary reserve given underlying emphysema with RLL atelectasis from associated mass and now superimposed pneumonia on the right. - continue heated high flow nasal canula for SpO2 goal 90-92% or higher - suspect we can wean this down today, discussed with RRT to move her to salter today - added nasal saline spray and gel for the epistaxis and dryness from  oxygen therapy - continue brovana and yupelri nebs - flutter valve - continue hypertonic nebs today with flutter valve  - full code  PCCM will follow  Durel Salts, MD Pulmonary and Critical Care Medicine [facility] 07/01/2023 11:13 AM Pager: see AMION  If no response to pager,  please call critical care on call (see AMION) until 7pm After 7:00 pm call Elink

## 2023-07-02 DIAGNOSIS — Z515 Encounter for palliative care: Secondary | ICD-10-CM

## 2023-07-02 DIAGNOSIS — A419 Sepsis, unspecified organism: Secondary | ICD-10-CM | POA: Diagnosis not present

## 2023-07-02 DIAGNOSIS — J189 Pneumonia, unspecified organism: Secondary | ICD-10-CM | POA: Diagnosis not present

## 2023-07-02 DIAGNOSIS — J9601 Acute respiratory failure with hypoxia: Secondary | ICD-10-CM | POA: Diagnosis not present

## 2023-07-02 DIAGNOSIS — J449 Chronic obstructive pulmonary disease, unspecified: Secondary | ICD-10-CM | POA: Diagnosis not present

## 2023-07-02 LAB — CBC
HCT: 30.4 % — ABNORMAL LOW (ref 36.0–46.0)
Hemoglobin: 8.7 g/dL — ABNORMAL LOW (ref 12.0–15.0)
MCH: 25 pg — ABNORMAL LOW (ref 26.0–34.0)
MCHC: 28.6 g/dL — ABNORMAL LOW (ref 30.0–36.0)
MCV: 87.4 fL (ref 80.0–100.0)
Platelets: 208 10*3/uL (ref 150–400)
RBC: 3.48 MIL/uL — ABNORMAL LOW (ref 3.87–5.11)
RDW: 21.4 % — ABNORMAL HIGH (ref 11.5–15.5)
WBC: 8.2 10*3/uL (ref 4.0–10.5)
nRBC: 0 % (ref 0.0–0.2)

## 2023-07-02 LAB — BASIC METABOLIC PANEL
Anion gap: 10 (ref 5–15)
BUN: 21 mg/dL (ref 8–23)
CO2: 26 mmol/L (ref 22–32)
Calcium: 8.5 mg/dL — ABNORMAL LOW (ref 8.9–10.3)
Chloride: 105 mmol/L (ref 98–111)
Creatinine, Ser: 0.79 mg/dL (ref 0.44–1.00)
GFR, Estimated: >60 mL/min (ref >60)
Glucose, Bld: 73 mg/dL (ref 70–99)
Potassium: 3.4 mmol/L — ABNORMAL LOW (ref 3.5–5.1)
Sodium: 141 mmol/L (ref 135–145)

## 2023-07-02 LAB — GLUCOSE, CAPILLARY: Glucose-Capillary: 72 mg/dL (ref 70–99)

## 2023-07-02 MED ORDER — BISMUTH SUBSALICYLATE 262 MG/15ML PO SUSP
30.0000 mL | ORAL | Status: DC | PRN
  Administered 2023-07-02 (×2): 30 mL via ORAL
  Filled 2023-07-02: qty 236

## 2023-07-02 MED ORDER — POTASSIUM CHLORIDE CRYS ER 20 MEQ PO TBCR
40.0000 meq | EXTENDED_RELEASE_TABLET | Freq: Once | ORAL | Status: AC
  Administered 2023-07-02: 40 meq via ORAL
  Filled 2023-07-02: qty 2

## 2023-07-02 MED ORDER — HYDROCODONE-ACETAMINOPHEN 7.5-325 MG PO TABS
1.0000 | ORAL_TABLET | ORAL | Status: DC | PRN
  Administered 2023-07-02 – 2023-07-04 (×10): 1 via ORAL
  Filled 2023-07-02 (×10): qty 1

## 2023-07-02 NOTE — Progress Notes (Signed)
  Progress Note   Patient: Lauren Salazar BJY:[id number] DOB: 1960/07/27 DOA: 06/25/2023     7 DOS: the patient was seen and examined on 07/02/2023 at 11:30AM      Brief hospital course: Metastatic lung cancer, respiratory failure due to ammonia, slow but steady progression.    Assessment and Plan: * Septic shock due to pneumonia Lung cancer Necrotizing right lower lobe lung mass, organizing right pleural effusion  -Continue Zosyn, day 6 of 7 - Titrate up hydrocodone - Consult Palliative care re: pain - Consult Pulmonology re: necrotic tumor and organized effusion  Iron deficiency anemia Hemoglobin stable - Continue iron             Subjective: Patient is having a lot of hip and rib pain.  She has no confusion.  She is not sleeping very well.     Physical Exam: BP 111/71   Pulse (!) 105   Temp 97.6 F (36.4 C) (Axillary)   Resp 19   Ht 5\' 6"  (1.676 m)   Wt 50 kg   SpO2 98%   BMI 17.79 kg/m   Thin adult female, lying in bed, interactive and appropriate RRR, no murmurs, edema in the upper extremities Respiratory rate normal, diminished on right right, nails clubbed Abdomen soft no tenderness palpation or guarding, no ascites or distention The right hip has no overt signs of injury or swelling.    Data Reviewed: Mild hypokalemia CBC shows stable anemia, white count normalized  Family Communication: Daughter by phone    Disposition: Status is: Inpatient PT eval and likely SNF in 2-3 days        Author: Alberteen Sam, MD 07/02/2023 4:23 PM  For on call review [email or web address].

## 2023-07-02 NOTE — Plan of Care (Signed)
  Problem: Education: Goal: Knowledge of General Education information will improve Description: Including pain rating scale, medication(s)/side effects and non-pharmacologic comfort measures Outcome: Progressing   Problem: Health Behavior/Discharge Planning: Goal: Ability to manage health-related needs will improve Outcome: Progressing   Problem: Clinical Measurements: Goal: Ability to maintain clinical measurements within normal limits will improve Outcome: Progressing Goal: Will remain free from infection Outcome: Progressing Goal: Cardiovascular complication will be avoided Outcome: Progressing   Problem: Coping: Goal: Level of anxiety will decrease Outcome: Progressing   Problem: Elimination: Goal: Will not experience complications related to bowel motility Outcome: Progressing Goal: Will not experience complications related to urinary retention Outcome: Progressing   Problem: Pain Managment: Goal: General experience of comfort will improve and/or be controlled Outcome: Progressing   Problem: Safety: Goal: Ability to remain free from injury will improve Outcome: Progressing   Problem: Skin Integrity: Goal: Risk for impaired skin integrity will decrease Outcome: Progressing

## 2023-07-02 NOTE — Plan of Care (Signed)
   Palliative Medicine Inpatient Follow Up Note   The PMT continues to follow along peripherally with Quenton Fetter. On 4LPM Malin per chart review.   Plan for discharge once medically optimized with follow up in the OP Palliative Oncology Symptom Management clinic to continue GOC conversations and symptom control.   No Charge.  ______________________________________________________________________________________ Lamarr Lulas Mathis Palliative Medicine Team Team Cell Phone: [phone] Please utilize secure chat with additional questions, if there is no response within 30 minutes please call the above phone number  Palliative Medicine Team providers are available by phone from 7am to 7pm daily and can be reached through the team cell phone.  Should this patient require assistance outside of these hours, please call the patient's attending physician.

## 2023-07-02 NOTE — Progress Notes (Signed)
 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 She has had increasing requirements oxygen requirements and transferred to the ICU. PCCM consulted today due to the increasing oxygen requirements.   CT Chest scan with contrast performed to evaluate pleural effusion noted on CT Chest scan 05/25/23. Today's CT is notable for enlarging mediastinal lymph nodes, subcarinal node mass and progressive left axillary and subpectoral adenopathy. Heterogeneous enhancing mass in the right lower lobe measuring 6.8 x 4.7cm with increased necrosis. The mass communicates with an increasingly organized right pleural fluid collection posteriorly with increased air within this collection.   She is having productive cough. Reports having issues over the past month with concern for pneumonia being treated as outpatient. She is very short of breath. Denies hemoptysis.  Pertinent  Medical History   Past Medical History:  Diagnosis Date   Bladder tumor    Cancer (HCC)    Complication of anesthesia    slow to wake after spinal fusion, then was "looney" for the following 24 hours    Persistent dry cough    ongoing since feb 2020   Significant Hospital Events: Including procedures, antibiotic start and stop dates in addition to other pertinent events   3/2 admitted 3/3 transferred to ICU, placed on HHFNC 3/4 remained on HHFNC, bedside US showed small right effusion not amenable to thoracentesis 3/5 remains on HHFNC, WBC trending down, afebrile  Interim History / Subjective:   No further epistaxis. Transitioned to salter and on 4-5LNC on  my initial evaluation. Had a rough night due ot hip and back pain.   Objective   Blood pressure (!) 88/62, pulse 96, temperature 97.6 F (36.4 C), temperature source Oral, resp. rate (!) 21, height 5\' 6"  (1.676 m), weight 50 kg, SpO2 100%.    FiO2 (%):  [36 %-40 %] 36 %   Intake/Output Summary (Last 24 hours) at 07/02/2023 1113 Last data filed at 07/02/2023 0844 Gross per 24 hour  Intake 157.24 ml  Output 900 ml  Net -742.76 ml   Filed Weights   06/26/23 0100  Weight: 50 kg    Examination: Thin frail woman on nasal cannula Appears fatigued and uncomfortable Breath sounds diminished no wheeze  CT Chest 06/27/23 shows RLL mass with focal necrosis and patchy left upper and lower lobe pneumonia Bedside ultrasound of the right pleural space without significant effusion amenable to thoracentesis.  Blood cultures ngtd Sputum culture normal flora from 3/4 Respiratory viral panel negative Mrsa negative  Resolved Hospital Problem list     Assessment & Plan:  Acute Hypoxemic Respiratory Failure Sepsis due to Pneumonia Emphysema/COPD Right lower lobe necrotizing lung mass Right complicated pleural effusion concern for empyema Metastatic non-small cell lung cancer to bone and adrenal glands Anemia Epistaxis Cancer related pain  Plan: - continue zosyn for pneumonia coverage through today for total 7 day course.  - I have turned her down to [facility] satting 98%. I did set the expectation it's possible she'll have to go home with oxygen - added nasal saline spray and gel for the epistaxis and dryness from oxygen therapy - continue brovana and yupelri nebs - flutter valve  prn - doesn't seem to have much productive cough - full code - would probably benefit from increasing pain control - will reach out to primary team.   PCCM will see as needed. Please call with questions.   Durel Salts, MD Pulmonary and Critical Care Medicine [facility] 07/02/2023 11:13 AM Pager: see  AMION  If no response to pager, please call critical care on call (see AMION) until 7pm After 7:00 pm call Elink

## 2023-07-03 ENCOUNTER — Inpatient Hospital Stay: Payer: Medicaid Other | Admitting: Nurse Practitioner

## 2023-07-03 DIAGNOSIS — G893 Neoplasm related pain (acute) (chronic): Secondary | ICD-10-CM | POA: Diagnosis not present

## 2023-07-03 DIAGNOSIS — Z7189 Other specified counseling: Secondary | ICD-10-CM | POA: Diagnosis not present

## 2023-07-03 DIAGNOSIS — Z515 Encounter for palliative care: Secondary | ICD-10-CM | POA: Diagnosis not present

## 2023-07-03 DIAGNOSIS — J9601 Acute respiratory failure with hypoxia: Secondary | ICD-10-CM | POA: Diagnosis not present

## 2023-07-03 LAB — BASIC METABOLIC PANEL
Anion gap: 10 (ref 5–15)
BUN: 20 mg/dL (ref 8–23)
CO2: 24 mmol/L (ref 22–32)
Calcium: 8.2 mg/dL — ABNORMAL LOW (ref 8.9–10.3)
Chloride: 105 mmol/L (ref 98–111)
Creatinine, Ser: 0.78 mg/dL (ref 0.44–1.00)
GFR, Estimated: >60 mL/min (ref >60)
Glucose, Bld: 65 mg/dL — ABNORMAL LOW (ref 70–99)
Potassium: 3.5 mmol/L (ref 3.5–5.1)
Sodium: 139 mmol/L (ref 135–145)

## 2023-07-03 LAB — CBC
HCT: 29.8 % — ABNORMAL LOW (ref 36.0–46.0)
Hemoglobin: 8.4 g/dL — ABNORMAL LOW (ref 12.0–15.0)
MCH: 25.3 pg — ABNORMAL LOW (ref 26.0–34.0)
MCHC: 28.2 g/dL — ABNORMAL LOW (ref 30.0–36.0)
MCV: 89.8 fL (ref 80.0–100.0)
Platelets: 197 10*3/uL (ref 150–400)
RBC: 3.32 MIL/uL — ABNORMAL LOW (ref 3.87–5.11)
RDW: 21.3 % — ABNORMAL HIGH (ref 11.5–15.5)
WBC: 8.1 10*3/uL (ref 4.0–10.5)
nRBC: 0 % (ref 0.0–0.2)

## 2023-07-03 MED ORDER — LOPERAMIDE HCL 2 MG PO CAPS: 2.0000 mg | ORAL_CAPSULE | ORAL | Status: DC | PRN

## 2023-07-03 MED ORDER — FENTANYL CITRATE PF 50 MCG/ML IJ SOSY: 25.0000 ug | PREFILLED_SYRINGE | INTRAMUSCULAR | Status: DC | PRN

## 2023-07-03 MED ORDER — FENTANYL 12 MCG/HR TD PT72
1.0000 | MEDICATED_PATCH | TRANSDERMAL | Status: DC
  Administered 2023-07-03 – 2023-07-09 (×3): 1 via TRANSDERMAL
  Filled 2023-07-03 (×3): qty 1

## 2023-07-03 NOTE — Evaluation (Signed)
 Physical Therapy Evaluation Patient Details Name: Lauren Salazar MRN: [id number] DOB: 1961-02-19 Today's Date: 07/03/2023  History of Present Illness  Pt is a 63 year old woman admitted on 06/25/23 with shortness of breath. + septic shock due to PNA, R L lower lobe mass with necrosis, Pt with new sacral and R buttocks pressure wounds. 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  Clinical Impression  Pt admitted with above diagnosis.  Pt currently with functional limitations due to the deficits listed below (see PT Problem List). Pt will benefit from acute skilled PT to increase their independence and safety with mobility to allow discharge.       The patient   reports feeling very weak and tired.  Patient agreeable to limited mobility, then needed to get to [facility], mod support to pivot.  Patient does demonstrate  weakness  during transfers and required mod support. Patient  unable to attempt ambulation.  Patient reports limited ambulation PTA, spends most of day hours in bed when family is at work.  Patient currently not deemed able to provide on self care to be alone. Patient will benefit from continued inpatient follow up therapy, <3 hours/day.  Patient remained on 5 L HFNC, SPO2 dropped to 88%, at rest 95%. HR max noted 1-8-121.  Patient demonstrates significant  debility  and most likely will require 24/7 caregivers.    If plan is discharge home, recommend the following: Two people to help with walking and/or transfers;Two people to help with bathing/dressing/bathroom;Assist for transportation;Help with stairs or ramp for entrance   Can travel by private vehicle   No    Equipment Recommendations Hospital bed  Recommendations for Other Services       Functional Status Assessment Patient has had a recent decline in their functional status and demonstrates the ability to make significant improvements in  function in a reasonable and predictable amount of time.     Precautions / Restrictions Precautions Precautions: Fall Precaution/Restrictions Comments: watch HR and O2 Restrictions Weight Bearing Restrictions Per Provider Order: No      Mobility  Bed Mobility         Supine to sit: Mod assist, HOB elevated, Used rails     General bed mobility comments: increased time, assist for  trunk  and LEs, heavy reliance on rail    Transfers Overall transfer level: Needs assistance   Transfers: Bed to chair/wheelchair/BSC       Squat pivot transfers: Mod assist     General transfer comment: pt unable to come to a full stand due to weakness, pivoted to and from [facility] using face to face method    Ambulation/Gait                  Stairs            Wheelchair Mobility     Tilt Bed    Modified Rankin (Stroke Patients Only)       Balance Overall balance assessment: Needs assistance Sitting-balance support: Bilateral upper extremity supported, Feet supported Sitting balance-Leahy Scale: Fair     Standing balance support: Bilateral upper extremity supported, During functional activity Standing balance-Leahy Scale: Zero Standing balance comment: face to face to balance  to pivot                             Pertinent Vitals/Pain Pain Assessment Faces  Pain Scale: Hurts little more Pain Location: R hip and back Pain Descriptors / Indicators: Discomfort, Grimacing, Guarding Pain Intervention(s): Premedicated before session, Monitored during session, Limited activity within patient's tolerance    Home Living Family/patient expects to be discharged to:: Private residence Living Arrangements: Children Available Help at Discharge: Family;Available PRN/intermittently (son and daughter in law work) Type of Home: House Home Access: Stairs to enter Entrance Stairs-Rails: None Secretary/administrator of Steps: 4   Home Layout: One level Home  Equipment: Agricultural consultant (2 wheels);Wheelchair - manual Additional Comments: pt has an adjustable bed and trapeze her son built    Prior Function Prior Level of Function : Needs assist             Mobility Comments: uses RW, stays in bed mostly, was walking 10 steps to bathroom. ADLs Comments: DTR in law stays nearby during shower, dependent in all IADLs     Extremity/Trunk Assessment   Upper Extremity Assessment Upper Extremity Assessment: Generalized weakness    Lower Extremity Assessment Lower Extremity Assessment: Generalized weakness    Cervical / Trunk Assessment Cervical / Trunk Assessment: Kyphotic  Communication   Communication Communication: No apparent difficulties    Cognition Arousal: Alert Behavior During Therapy: WFL for tasks assessed/performed, Flat affect   PT - Cognitive impairments: No apparent impairments                         Following commands: Intact       Cueing Cueing Techniques: Verbal cues     General Comments      Exercises     Assessment/Plan    PT Assessment Patient needs continued PT services  PT Problem List Decreased strength;Decreased balance;Decreased knowledge of precautions;Decreased mobility;Cardiopulmonary status limiting activity;Decreased activity tolerance;Decreased safety awareness;Pain       PT Treatment Interventions DME instruction;Functional mobility training;Patient/family education;Gait training;Therapeutic activities;Therapeutic exercise    PT Goals (Current goals can be found in the Care Plan section)  Acute Rehab PT Goals Patient Stated Goal: go home PT Goal Formulation: With patient Time For Goal Achievement: 07/17/23 Potential to Achieve Goals: Fair    Frequency Min 2X/week     Co-evaluation PT/OT/SLP Co-Evaluation/Treatment: Yes Reason for Co-Treatment: For patient/therapist safety PT goals addressed during session: Mobility/safety with mobility OT goals addressed during  session: ADL's and self-care       AM-PAC PT "6 Clicks" Mobility  Outcome Measure Help needed turning from your back to your side while in a flat bed without using bedrails?: A Lot Help needed moving from lying on your back to sitting on the side of a flat bed without using bedrails?: A Lot Help needed moving to and from a bed to a chair (including a wheelchair)?: A Lot Help needed standing up from a chair using your arms (e.g., wheelchair or bedside chair)?: A Lot Help needed to walk in hospital room?: Total Help needed climbing 3-5 steps with a railing? : Total 6 Click Score: 10    End of Session Equipment Utilized During Treatment: Oxygen Activity Tolerance: Patient limited by fatigue Patient left: in bed;with call bell/phone within reach Nurse Communication: Mobility status PT Visit Diagnosis: Unsteadiness on feet (R26.81);Muscle weakness (generalized) (M62.81);History of falling (Z91.81)    Time: 1000-1031 PT Time Calculation (min) (ACUTE ONLY): 31 min   Charges:   PT Evaluation $PT Eval Low Complexity: 1 Low   PT General Charges $$ ACUTE PT VISIT: 1 Visit  Blanchard Kelch PT Acute Rehabilitation Services Office [phone]    Rada Hay 07/03/2023, 12:12 PM

## 2023-07-03 NOTE — Progress Notes (Signed)
 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    Palliative care has been asked to get involved to support additional goals of care conversations. She has been established with the OP Palliative Cancer clinic since 07/12/2022.  Today's Discussion 07/03/2023  *Please note that this is a verbal dictation therefore any spelling or grammatical errors are due to the "[employer] One" system interpretation.  Chart reviewed inclusive of vital signs, progress notes, laboratory results, and diagnostic images.   I met with Lauren Salazar at bedside this morning. She is awake and oriented. She shares that she had just gotten here from the ICU. She had mobilized and was readjusting in the bed. She is having a great deal of discomfort at the time of assessment. Lauren Salazar notes that she continues to have ongoing pain. She states that it is typically from her back to her hips though at the time of assessment it was "everywhere". She shares that the fentanyl IVP does help though only for a short period of time. She is agreeable to trying a patch to se if this will have longer lasting effects and eliminate the peaks and valleys of her present pain regiment. We reviewed that she will still have the ability to request her PRN Norco.   In addition, Lauren Salazar is having frequent loose bowel movements which come on unprovoked and she is often not sure if it is "just gas" or stool.   Lauren Salazar does note that she continues to feel weakened. She is hopeful more therapy will benefit her.  We discussed that from a breathing perspective, Lauren Salazar does endorse a great deal of improvement. She has a weak cough though is ordered for chest physiotherapy.   Sleep remains variable. Some days she sleeps better than other at this  time.   Created space and opportunity for patient to explore thoughts feelings and fears regarding current medical situation. She shares that she maintains the hope for improvement. She does not want to stop present treatments and ideally wants additional therapy for her lung cancer.   Questions and concerns addressed/Palliative Support Provided.   Objective Assessment: Vital Signs Vitals:   07/03/23 0715 07/03/23 1031  BP: 124/69   Pulse: (!) 109   Resp: 20   Temp: (!) 97.4 F (36.3 C)   SpO2: 90% 94%    Intake/Output Summary (Last 24 hours) at 07/03/2023 1149 Last data filed at 07/03/2023 1610 Gross per 24 hour  Intake 340.35 ml  Output 600 ml  Net -259.65 ml   Last Weight  Most recent update: 06/26/2023  1:49 AM    Weight  50 kg (110 lb 3.7 oz)            Gen:  Elderly Caucasian F in distress d/t pain HEENT: moist mucous membranes CV: Irregular rate and rhythm  PULM: On 5LPM HFNC ABD: soft/nontender  EXT: BUE edema R>L Neuro: Alert and oriented x3   SUMMARY OF RECOMMENDATIONS   Full code/ Full scope of care    Appreciate TOC helping arrange meals on discharge   Ongoing PMT support  Symptom Management: ____________________________________  Chronic Pain: - Added fentanyl patch 49mcg/hr Replace Q72H - Continue Norco 7.5mg /325mg  oral Q3H PRN - Fentanyl IVP Q2H PRN x2 additional doses if needed  Insomnia: - Melatonin 3mg  PO Qday - Can continue xanax PRN  Loose Bowels: - Not on any laxatives or stool softeners presently - Per RN it is not overtly malodorous - Nursing to maintain an eye on how much stool, it's quality, and frequency in the setting of antibiotics and immunocompromise  Generalized Weakness: - PT/OT - Daily mobility  Shortness of Breath: - PNA management per primary - Chest PT - Suction set up at bedside  Time Spent: 53 Billing based on MDM:  High ______________________________________________________________________________________ Lamarr Lulas [place] Palliative Medicine Team Team Cell Phone: [phone] Please utilize secure chat with additional questions, if there is no response within 30 minutes please call the above phone number  Palliative Medicine Team providers are available by phone from 7am to 7pm daily and can be reached through the team cell phone.  Should this patient require assistance outside of these hours, please call the patient's attending physician.

## 2023-07-03 NOTE — Progress Notes (Signed)
   07/03/23 1347  Assess: MEWS Score  Temp 97.8 F (36.6 C)  BP 117/75  MAP (mmHg) 88  Pulse Rate (!) 108  Resp (!) 26  SpO2 100 %  Assess: MEWS Score  MEWS Temp 0  MEWS Systolic 0  MEWS Pulse 1  MEWS RR 2  MEWS LOC 0  MEWS Score 3  MEWS Score Color Yellow  Assess: if the MEWS score is Yellow or Red  Were vital signs accurate and taken at a resting state? Yes  Does the patient meet 2 or more of the SIRS criteria? Yes  Does the patient have a confirmed or suspected source of infection? Yes  MEWS guidelines implemented  Yes, yellow  Treat  MEWS Interventions Considered administering scheduled or prn medications/treatments as ordered  Take Vital Signs  Increase Vital Sign Frequency  Yellow: Q2hr x1, continue Q4hrs until patient remains green for 12hrs  Escalate  MEWS: Escalate Yellow: Discuss with charge nurse and consider notifying provider and/or RRT  Notify: Charge Nurse/RN  Name of Charge Nurse/RN Notified Jenifer Silva,RN  Provider Notification  Provider Name/Title Samella Parr  Date Provider Notified 07/03/23  Time Provider Notified 1358  Method of Notification  (Secure chat)  Notification Reason Other (Comment) (yellow mews)  Provider response No new orders  Date of Provider Response 07/03/23  Time of Provider Response 1408  Assess: SIRS CRITERIA  SIRS Temperature  0  SIRS Respirations  1  SIRS Pulse 1  SIRS WBC 0  SIRS Score Sum  2

## 2023-07-03 NOTE — Plan of Care (Signed)
  Problem: Clinical Measurements: Goal: Will remain free from infection Outcome: Progressing Goal: Cardiovascular complication will be avoided Outcome: Progressing   Problem: Activity: Goal: Risk for activity intolerance will decrease Outcome: Progressing   

## 2023-07-03 NOTE — Evaluation (Signed)
 Occupational Therapy Evaluation Patient Details Name: Lauren Salazar MRN: [id number] DOB: 12/15/1960 Today's Date: 07/03/2023   History of Present Illness   Pt is a 63 year old woman admitted on 06/25/23 with shortness of breath. + septic shock due to PNA, R L lower lobe mass with necrosis, Pt with new sacral and R buttocks pressure wounds. 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     Clinical Impressions Pt lives with her son and daughter in law who both work during the day. She mostly stays in her room, but could walk 10 steps to her bathroom and stand to shower with her daughter in law's supervision. Pt reports standing from her standard toilet was difficult. Pt presents with generalized weakness, decreased activity tolerance and inability to ambulate. She currently transfers without coming to a full stand with moderate assistance. She needs set up to total assist for ADLs. Pt able to maintain SpO2 of 88-95% on 5L during activity, HR to 121. Patient will benefit from continued inpatient follow up therapy, <3 hours/day.     If plan is discharge home, recommend the following:   A lot of help with walking and/or transfers;A lot of help with bathing/dressing/bathroom;Assistance with cooking/housework;Assist for transportation;Help with stairs or ramp for entrance     Functional Status Assessment   Patient has had a recent decline in their functional status and/or demonstrates limited ability to make significant improvements in function in a reasonable and predictable amount of time     Equipment Recommendations   BSC/3in1     Recommendations for Other Services         Precautions/Restrictions   Precautions Precautions: Fall Precaution/Restrictions Comments: watch HR and O2 Restrictions Weight Bearing Restrictions Per Provider Order: No     Mobility Bed Mobility Overal bed  mobility: Needs Assistance Bed Mobility: Supine to Sit, Sit to Supine     Supine to sit: Min assist, HOB elevated, Used rails Sit to supine: Min assist   General bed mobility comments: increased time, assist for LEs, heavy reliance on rail    Transfers Overall transfer level: Needs assistance   Transfers: Bed to chair/wheelchair/BSC     Squat pivot transfers: Mod assist       General transfer comment: pt unable to come to a full stand due to weakness, pivoted to and from [facility] using face to face method      Balance Overall balance assessment: Needs assistance   Sitting balance-Leahy Scale: Fair       Standing balance-Leahy Scale: Zero                             ADL either performed or assessed with clinical judgement   ADL Overall ADL's : Needs assistance/impaired Eating/Feeding: Independent;Bed level   Grooming: Supervision/safety;Sitting   Upper Body Bathing: Minimal assistance;Sitting   Lower Body Bathing: Total assistance;Sit to/from stand   Upper Body Dressing : Sitting;Minimal assistance   Lower Body Dressing: Total assistance;Bed level   Toilet Transfer: Moderate assistance;Squat-pivot Toilet Transfer Details (indicate cue type and reason): pt did not come to a full stand Toileting- Architect and Hygiene: Total assistance;+2 for physical assistance;Sit to/from stand       Functional mobility during ADLs: Moderate assistance (squat pivot to [facility])       Vision Baseline Vision/History: 1 Wears glasses Ability to See in Adequate Light: 0 Adequate  Patient Visual Report: No change from baseline       Perception         Praxis         Pertinent Vitals/Pain Pain Assessment Pain Assessment: Faces Faces Pain Scale: Hurts little more Pain Location: R hip and back Pain Descriptors / Indicators: Discomfort, Grimacing, Guarding Pain Intervention(s): Monitored during session, Premedicated before session, Repositioned      Extremity/Trunk Assessment Upper Extremity Assessment Upper Extremity Assessment: Generalized weakness;Overall [facility] for tasks assessed       Cervical / Trunk Assessment Cervical / Trunk Assessment: Kyphotic   Communication Communication Communication: No apparent difficulties   Cognition Arousal: Alert Behavior During Therapy: WFL for tasks assessed/performed Cognition: Cognition impaired     Awareness: Intellectual awareness impaired, Online awareness impaired     Executive functioning impairment (select all impairments): Initiation, Reasoning OT - Cognition Comments: pt requires moderate assistance for OOB, thinks she can manage at home alone                 Following commands: Intact       Cueing  General Comments   Cueing Techniques: Verbal cues      Exercises     Shoulder Instructions      Home Living Family/patient expects to be discharged to:: Private residence Living Arrangements: Children Available Help at Discharge: Family;Available PRN/intermittently (son and daughter in law work) Type of Home: House Home Access: Stairs to enter Secretary/administrator of Steps: 4 Entrance Stairs-Rails: None Home Layout: One level     Bathroom Shower/Tub: Producer, television/film/video: Standard     Home Equipment: Agricultural consultant (2 wheels);Wheelchair - manual   Additional Comments: pt has an adjustable bed and trapeze her son built      Prior Functioning/Environment Prior Level of Function : Needs assist             Mobility Comments: uses RW, stays in bed mostly, was walking 10 steps to bathroom. ADLs Comments: DTR in law stays nearby during shower, dependent in all IADLs    OT Problem List: Decreased strength;Decreased activity tolerance;Impaired balance (sitting and/or standing);Decreased cognition;Decreased safety awareness;Decreased knowledge of use of DME or AE;Pain;Cardiopulmonary status limiting activity   OT  Treatment/Interventions: Self-care/ADL training;Energy conservation;DME and/or AE instruction;Therapeutic activities;Balance training;Patient/family education      OT Goals(Current goals can be found in the care plan section)   Acute Rehab OT Goals OT Goal Formulation: With patient Time For Goal Achievement: 07/17/23 Potential to Achieve Goals: Good ADL Goals Pt Will Perform Upper Body Dressing: with set-up;sitting Pt Will Perform Lower Body Dressing: with mod assist;sit to/from stand Pt Will Transfer to Toilet: with contact guard assist;ambulating;bedside commode Pt Will Perform Toileting - Clothing Manipulation and hygiene: sit to/from stand;with min assist Additional ADL Goal #1: Pt will complete bed mobility modified independently in preparation for ADLs. Additional ADL Goal #2: Pt will employ pursed lip breathing and energy conservation strategies during ADLs and mobility.   OT Frequency:  Min 2X/week    Co-evaluation PT/OT/SLP Co-Evaluation/Treatment: Yes Reason for Co-Treatment: For patient/therapist safety   OT goals addressed during session: ADL's and self-care      AM-PAC OT "6 Clicks" Daily Activity     Outcome Measure Help from another person eating meals?: None Help from another person taking care of personal grooming?: A Little Help from another person toileting, which includes using toliet, bedpan, or urinal?: Total Help from another person bathing (including washing, rinsing, drying)?: A Lot Help from  another person to put on and taking off regular upper body clothing?: A Little Help from another person to put on and taking off regular lower body clothing?: Total 6 Click Score: 14   End of Session Equipment Utilized During Treatment: Oxygen (5L) Nurse Communication: Mobility status;Other (comment) (pt had BM)  Activity Tolerance: Patient limited by fatigue Patient left: in bed;with call bell/phone within reach;Other (comment) (RT in room)  OT Visit  Diagnosis: Unsteadiness on feet (R26.81);Muscle weakness (generalized) (M62.81);History of falling (Z91.81);Other symptoms and signs involving cognitive function;Pain                Time: 1610-9604 OT Time Calculation (min): 33 min Charges:  OT General Charges $OT Visit: 1 Visit OT Evaluation $OT Eval Moderate Complexity: 1 Mod Berna Spare, OTR/L Acute Rehabilitation Services Office: [phone]   Evern Bio 07/03/2023, 11:05 AM

## 2023-07-03 NOTE — TOC Progression Note (Signed)
 Transition of Care [facility]) - Progression Note    Patient Details  Name: Lauren Salazar MRN: [id number] Date of Birth: Jul 19, 1960  [facility] of [facility]) CM/SW Contact  Kyrillos Adams, Olegario Messier, RN Phone Number: 07/03/2023, 5:15 PM  Clinical Narrative: Await closer to medical stability on HFNC 5L-await till on 02 [place] prior faxing out.      Expected Discharge Plan: Skilled Nursing Facility Barriers to Discharge: Continued Medical Work up  Expected Discharge Plan and Services In-house Referral: Clinical Social Work     Living arrangements for the past 2 months: Single Family Home                                       Social Determinants of Health (SDOH) Interventions SDOH Screenings   Food Insecurity: No Food Insecurity (06/26/2023)  Housing: Low Risk  (06/26/2023)  Transportation Needs: No Transportation Needs (06/26/2023)  Utilities: Not At Risk (06/26/2023)  Tobacco Use: High Risk (06/25/2023)    Readmission Risk Interventions     No data to display

## 2023-07-03 NOTE — Progress Notes (Signed)
  Progress Note   Patient: Lauren Salazar NWG:[id number] DOB: 12/06/1960 DOA: 06/25/2023     8 DOS: the patient was seen and examined on 07/03/2023 at 8:20AM      Brief hospital course: 63 y.o. 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       Assessment and Plan: * Septic shock due to pneumonia Acute respiratory failure with hypoxia Necrotizing right lower lobe lung mass, organizing right pleural effusion  Presented with leukocytosis, tachypnea, respiratory failure, and lactic acid 5.9 in the setting of pneumonia.  CT showed organized parapneumonic area, suspect this is largely tumor, and effluent.  Pulmonology consulted, recommended against Chest tube.  Thankfully, weaned from 50L HHFNC to 2-4L over the weekend and WBC normalized.  Sputum with normal respiratory flora. - Continue Zosyn, day 7 of 7 - Continue chest PT - Continue ICS/LAMA/LABA     Iron deficiency anemia Transfused 1u on 3/3 and second unit on 3/5.  Stable since then.  This appears to be new since last October, hemoglobin is dropped fairly dramatically.  No reported or observed GI bleeding. - Trend hemoglobin - Continue new iron  Pressure injury of skin Of the sacrum and right buttock, unstageable, present on admission - Consult wound care  Lung cancer [facility]) Metastatic to bone and adrenals Had palliative radiation in the past to the right hip.  Had been on 3rd line therapy with docetaxel/VEGFR2 Ab until Feb, when it was stopped due to side effects, worsening functional status.  To me she is quite alert mentally, and reports that despite worsening functional status she was still fairly independent and had even gone out to eat with friends the weekend before she was admitted.    Palliative have been engaging in GOC discussions.  Dr. Leonides Schanz returns tomorrow.  Given her  worsening right hip pain, I would refer to him if a second course of palliative radiation helps in situations like this.          Subjective: No change, very tired.     Physical Exam: BP 117/75 (BP Location: Left Arm)   Pulse (!) 108   Temp 97.8 F (36.6 C) (Oral)   Resp (!) 26   Ht 5\' 6"  (1.676 m)   Wt 50 kg   SpO2 100%   BMI 17.79 kg/m   Thin elderly female, in bed, appears weak and tired, using suction to handle her sputum Tachycardic, no peripheral edema Respiratory effort shallow, somewhat fast, poor air movement, very diminished on the right base Abdomen soft, no tenderness palpation She is responsive to questions, makes eye contact, but extremely tired, then closes her eyes, she has generalized weakness but symmetric strength and coordinated upper extremity movement, speech fluent, oriented to person place and time     Data Reviewed: Discussed with palliative care CBC shows stable anemia, normal white count Basic metabolic panel unremarkable  Family Communication:     Disposition: Status is: Inpatient         Author: Alberteen Sam, MD 07/03/2023 3:52 PM  For on call review [email or web address].

## 2023-07-04 ENCOUNTER — Inpatient Hospital Stay: Payer: Medicaid Other | Admitting: Hematology and Oncology

## 2023-07-04 ENCOUNTER — Inpatient Hospital Stay: Payer: Medicaid Other

## 2023-07-04 ENCOUNTER — Other Ambulatory Visit: Payer: Self-pay | Admitting: Hematology and Oncology

## 2023-07-04 DIAGNOSIS — R6521 Severe sepsis with septic shock: Secondary | ICD-10-CM | POA: Diagnosis not present

## 2023-07-04 DIAGNOSIS — R52 Pain, unspecified: Secondary | ICD-10-CM | POA: Diagnosis not present

## 2023-07-04 DIAGNOSIS — C3431 Malignant neoplasm of lower lobe, right bronchus or lung: Secondary | ICD-10-CM

## 2023-07-04 DIAGNOSIS — Z515 Encounter for palliative care: Secondary | ICD-10-CM | POA: Diagnosis not present

## 2023-07-04 DIAGNOSIS — A419 Sepsis, unspecified organism: Secondary | ICD-10-CM | POA: Diagnosis not present

## 2023-07-04 LAB — COMPREHENSIVE METABOLIC PANEL
ALT: 11 U/L (ref 0–44)
AST: 17 U/L (ref 15–41)
Albumin: 1.6 g/dL — ABNORMAL LOW (ref 3.5–5.0)
Alkaline Phosphatase: 94 U/L (ref 38–126)
Anion gap: 11 (ref 5–15)
BUN: 20 mg/dL (ref 8–23)
CO2: 23 mmol/L (ref 22–32)
Calcium: 8.3 mg/dL — ABNORMAL LOW (ref 8.9–10.3)
Chloride: 105 mmol/L (ref 98–111)
Creatinine, Ser: 0.78 mg/dL (ref 0.44–1.00)
GFR, Estimated: >60 mL/min (ref >60)
Glucose, Bld: 64 mg/dL — ABNORMAL LOW (ref 70–99)
Potassium: 3.5 mmol/L (ref 3.5–5.1)
Sodium: 139 mmol/L (ref 135–145)
Total Bilirubin: 1 mg/dL (ref 0.0–1.2)
Total Protein: 5.1 g/dL — ABNORMAL LOW (ref 6.5–8.1)

## 2023-07-04 LAB — CBC
HCT: 29.8 % — ABNORMAL LOW (ref 36.0–46.0)
Hemoglobin: 8.3 g/dL — ABNORMAL LOW (ref 12.0–15.0)
MCH: 25 pg — ABNORMAL LOW (ref 26.0–34.0)
MCHC: 27.9 g/dL — ABNORMAL LOW (ref 30.0–36.0)
MCV: 89.8 fL (ref 80.0–100.0)
Platelets: 197 10*3/uL (ref 150–400)
RBC: 3.32 MIL/uL — ABNORMAL LOW (ref 3.87–5.11)
RDW: 21.3 % — ABNORMAL HIGH (ref 11.5–15.5)
WBC: 8 10*3/uL (ref 4.0–10.5)
nRBC: 0 % (ref 0.0–0.2)

## 2023-07-04 MED ORDER — HYDROCODONE-ACETAMINOPHEN 5-325 MG PO TABS
1.0000 | ORAL_TABLET | ORAL | Status: DC | PRN
  Administered 2023-07-04 – 2023-07-05 (×6): 1 via ORAL
  Filled 2023-07-04 (×6): qty 1

## 2023-07-04 MED ORDER — FENTANYL CITRATE PF 50 MCG/ML IJ SOSY
12.5000 ug | PREFILLED_SYRINGE | INTRAMUSCULAR | Status: DC | PRN
  Administered 2023-07-04 (×2): 12.5 ug via INTRAVENOUS
  Filled 2023-07-04 (×2): qty 1

## 2023-07-04 NOTE — Progress Notes (Addendum)
 Progress Note   Patient: Lauren Salazar HQI:[id number] DOB: [birth year] DOA: 06/25/2023     9 DOS: the patient was seen and examined on 07/04/2023 at 12:24PM      Brief hospital course: 63 y.o. 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   Significant events: 3/2: Admitted to SDU for sepsis 3/3: Escalated to HHFNC 40L; transfused 1u PRBCs 3/4: CT chest showed enlarging lung mass, communicating with complex pleural effusion, pulm consulted 3/5: Transfused second unit PRBCs 3/6: Still HHFNC 40L 3/7: Weaned to 30L 3/8: Weaned to 5-6L 3/9: Weaned to 2-4L, transferred OOU 3/11: Stable, very weak   Significant studies: 3/4 CT chest: 6x4cm necrotic lung mass, progressive adenopathy, organized right pleural fluid collection   Significant microbiology data: 3/2 COVID/flu/RSV PCR: negative 3/2 Blood culture x2: NGTD 3/3 MRSA nares: negative 3/4 RVP PCR: negative 3/4 Sputum: Normal respiratory flora    Procedures: --    Consults: CCM Palliative Oncology     Assessment and Plan: * Septic shock due to pneumonia Acute respiratory failure with hypoxia Necrotizing right lower lobe lung mass, organizing right pleural effusion  Presented with leukocytosis, tachypnea, respiratory failure, and lactic acid 5.9 in the setting of pneumonia.  CT showed organized parapneumonic area, suspect this is largely tumor, and effluent.  Pulmonology consulted, recommended against Chest tube.  Thankfully, weaned from 50L HHFNC to 2-4L over the weekend and WBC normalized.  Sputum with normal respiratory flora.  Discussed with Pulm, we agreed to administer 7 days total Zosyn which is complete, agreed likely to remain on O2 indefinitely.  Risk of treatment failure may be elevated, will monitor WBC daily.    Iron deficiency anemia Transfused 1u on 3/3 and  second unit on 3/5.  Stable since then.  This appears to be new since last October, hemoglobin is dropped fairly dramatically.  No reported or observed GI bleeding. Hgb stable - Trend hemoglobin - Continue new iron  Lung cancer (HCC) Metastatic to bone and adrenals Had palliative radiation in the past to the right hip.  Had been on 3rd line therapy with docetaxel/VEGFR2 Ab until Feb, when it was stopped due to side effects, worsening functional status.  To me she is quite alert mentally, and reports that despite worsening functional status she was still fairly independent and had even gone out to eat with friends the weekend before she was admitted.    Palliative have been engaging in GOC discussions.  Dr. Leonides Schanz returns tomorrow.  Given her worsening right hip pain, I would refer to him if a second course of palliative radiation helps in situations like this.  - Consult Palliative Care - Consult Oncology - Continue ICS/LAMA/LABA    Diarrhea Stopped the MiraLAX + senna and this improved.       Subjective: Feels weak.  Right leg is extremely weak.  Breathing feels tired.       Physical Exam: BP 109/71 (BP Location: Left Arm)   Pulse (!) 103   Temp 97.6 F (36.4 C) (Oral)   Resp 16   Ht 5\' 6"  (1.676 m)   Wt 50 kg   SpO2 96%   BMI 17.79 kg/m   Thin elderly female, lying in bed, weak and tired Tachycardic, no murmurs, no peripheral edema Respiratory effort shallow, diminished at the right base, poor air movement Clubbing noted Answers questions, makes eye contact, but tired, oriented to  person, place, time, severe generalized weakness  Data Reviewed: Discussed with oncology team CBC shows stable anemia and normal white count Basic metabolic panel unremarkable  Family Communication:     Disposition: Status is: Inpatient The patient was admitted with respiratory failure and pneumonia sepsis  This is now resolved and she is completed antibiotics.  However she  remains on oxygen and her overall status appears to have deteriorated considerably.  She may need time to engage in goals of care discussions with palliative care and oncology, which are progressing now.  If oncology have no further plans in the next few days, she may be able to discharge to rehab or home with hospice later this week        Author: Alberteen Sam, MD 07/04/2023 2:09 PM  For on call review [email or web address].

## 2023-07-04 NOTE — Progress Notes (Deleted)
 [name] [phone]   [phone]  PROGRESS NOTE  Patient Care Team: [facility] [place] as PCP - General (Family Medicine)  Hematological/Oncological History # Metastatic Adenosquamous Lung Cancer  05/10/2022: last visit with Dr. Clelia Croft 05/26/2022: CT scan showed enlargement of RLL mass, right iliac crest mass with large soft tissue components extending in the iliacus and gluteus musculature. Additionally new pathologic right external iliac adenopathy.  06/07/2022: transition care to Dr. Leonides Schanz  07/12/2022: Cycle 1 Day 1 of docetaxel and ramucirumab 08/02/2022: Cycle 2 Day 1 of docetaxel and ramucirumab (dose reduced docetaxel from 75 mg/m2 to 60 mg/m2 due to poor tolerance) 08/23/2022: Cycle 3 Day 1 of docetaxel and ramucirumab 10/04/2022: Cycle 4 Day 1 of docetaxel and ramucirumab 11/01/2022: Cycle 5 Day 1 of docetaxel and ramucirumab 11/29/2022: Cycle 6 Day 1 of docetaxel and ramucirumab (dose reduced docetaxel from 60 mg/m2 to 50 mg/m2 due to persistent fatigue) 12/20/2022: Cycle 7 Day 1 of docetaxel and ramucirumab 01/31/2023: held Cycle 8 Day 1 of docetaxel and ramucirumab due to severe congestion/cough  Interval History:  Lauren Salazar 63 y.o. female with medical history significant for metastatic adenosquamous lung cancer presents for a follow up visit. The patient's last visit was on 04/25/2023. She presents today for a follow up.  Ms. Pearcy reports she is having quite a bit of pain in her back and right hip. She is not taking any pain medication at this time and is requesting medication today. She reports that she has little to no energy. She spends more than 50% of the day in bed. She has a poor appetite and lost another 5-6 lbs since the last visit. She reports occasional episodes of nausea without vomiting. She reports some constipation with the last bowel was 3 days ago. She takes colace with minimal relief. She denies  easy bruising or signs of bleeding. She reports having worsening shortness of breath with minimal exertion. She has a chronic cough with mainly clear sputum. She denies fevers, chills, sweats, chest pain or headaches. She has no other complaints. Rest of the 10 point ROS was otherwise negative.  MEDICAL HISTORY:  Past Medical History:  Diagnosis Date   Bladder tumor    Cancer (HCC)    Complication of anesthesia    slow to wake after spinal fusion, then was "looney" for the following 24 hours    Persistent dry cough    ongoing since feb 2020    SURGICAL HISTORY: Past Surgical History:  Procedure Laterality Date   CERVICAL FUSION  2005   c5-c6      CYSTOSCOPY WITH INJECTION N/A 02/01/2019   Procedure: CYSTOSCOPY WITH INJECTION;  Surgeon: Sebastian Ache, MD;  Location: [facility];  Service: Urology;  Laterality: N/A;   IR IMAGING GUIDED PORT INSERTION  07/25/2022   LYMPHADENECTOMY Bilateral 02/01/2019   Procedure: LYMPHADENECTOMY;  Surgeon: Sebastian Ache, MD;  Location: [facility];  Service: Urology;  Laterality: Bilateral;   ROBOT ASSISTED LAPAROSCOPIC COMPLETE CYSTECT ILEAL CONDUIT N/A 02/01/2019   Procedure: XI ROBOTIC ASSISTED LAPAROSCOPIC COMPLETE CYSTECTOMY, ILEAL CONDUIT, HYSTERECTOMY WITH BILATERAL SALPINGOOPHORECTOMY;  Surgeon: Sebastian Ache, MD;  Location: [facility];  Service: Urology;  Laterality: N/A;  6 HRS   TRANSURETHRAL RESECTION OF BLADDER TUMOR N/A 10/31/2018   Procedure: TRANSURETHRAL RESECTION OF BLADDER TUMOR (TURBT) WITH CYSTOSCOPY/ POSSIBLE INTRAVESICAL GEMCITABINE;  Surgeon: Rene Paci, MD;  Location: [facility];  Service: Urology;  Laterality: N/A;    SOCIAL HISTORY: Social History  Socioeconomic History   Marital status: Divorced    Spouse name: Not on file   Number of children: Not on file   Years of education: Not on file   Highest education level: Not on file  Occupational History   Not on file  Tobacco Use   Smoking status: Every Day    Current  packs/day: 1.00    Types: Cigarettes   Smokeless tobacco: Never   Tobacco comments:    smoking since age 48 , she is cutting back, using patches, trying to stop. 04/02/19  Vaping Use   Vaping status: Never Used  Substance and Sexual Activity   Alcohol use: No   Drug use: No   Sexual activity: Not Currently  Other Topics Concern   Not on file  Social History Narrative   Not on file   Social Drivers of Health   Financial Resource Strain: Not on file  Food Insecurity: No Food Insecurity (06/26/2023)   Hunger Vital Sign    Worried About Running Out of Food in the Last Year: Never true    Ran Out of Food in the Last Year: Never true  Transportation Needs: No Transportation Needs (06/26/2023)   PRAPARE - Administrator, Civil Service (Medical): No    Lack of Transportation (Non-Medical): No  Physical Activity: Not on file  Stress: Not on file  Social Connections: Not on file  Intimate Partner Violence: Not At Risk (06/26/2023)   Humiliation, Afraid, Rape, and Kick questionnaire    Fear of Current or Ex-Partner: No    Emotionally Abused: No    Physically Abused: No    Sexually Abused: No    FAMILY HISTORY: No family history on file.  ALLERGIES:  is allergic to morphine sulfate and celecoxib.  MEDICATIONS:  No current facility-administered medications for this visit.   No current outpatient medications on file.   Facility-Administered Medications Ordered in Other Visits  Medication Dose Route Frequency Provider Last Rate Last Admin   acetaminophen (TYLENOL) tablet 650 mg  650 mg Oral Q6H PRN Kirby Crigler, Mir M, MD       Or   acetaminophen (TYLENOL) suppository 650 mg  650 mg Rectal Q6H PRN Kirby Crigler, Mir M, MD       acetaminophen (TYLENOL) tablet 1,000 mg  1,000 mg Oral QHS Danford, Earl Lites, MD       albuterol (PROVENTIL) (2.5 MG/3ML) 0.083% nebulizer solution 2.5 mg  2.5 mg Nebulization Q2H PRN Kirby Crigler, Mir M, MD   2.5 mg at 06/27/23 1914   ALPRAZolam  Prudy Feeler) tablet 0.5 mg  0.5 mg Oral Daily PRN Alberteen Sam, MD   0.5 mg at 07/03/23 2222   alum & mag hydroxide-simeth (MAALOX/MYLANTA) 200-200-20 MG/5ML suspension 30 mL  30 mL Oral Q4H PRN Danford, Earl Lites, MD       arformoterol (BROVANA) nebulizer solution 15 mcg  15 mcg Nebulization BID Alberteen Sam, MD   15 mcg at 07/03/23 1942   bismuth subsalicylate (PEPTO BISMOL) 262 MG/15ML suspension 30 mL  30 mL Oral Q4H PRN Alberteen Sam, MD   30 mL at 07/02/23 2209   budesonide (PULMICORT) nebulizer solution 0.5 mg  0.5 mg Nebulization BID Melody Comas B, MD   0.5 mg at 07/03/23 1942   Chlorhexidine Gluconate Cloth 2 % PADS 6 each  6 each Topical Q0600 Maryln Gottron, MD   6 each at 07/03/23 0824   enoxaparin (LOVENOX) injection 40 mg  40 mg Subcutaneous Q24H Kirby Crigler,  Mir M, MD   40 mg at 07/03/23 2223   feeding supplement (ENSURE ENLIVE / ENSURE PLUS) liquid 237 mL  237 mL Oral BID BM Danford, Earl Lites, MD   237 mL at 07/03/23 1408   fentaNYL (DURAGESIC) 12 MCG/HR 1 patch  1 patch Transdermal Q72H Ernie Avena, NP   1 patch at 07/03/23 0825   fentaNYL (SUBLIMAZE) injection 25 mcg  25 mcg Intravenous Q2H PRN Ernie Avena, NP       ferrous sulfate tablet 325 mg  325 mg Oral QODAY Alberteen Sam, MD   325 mg at 07/02/23 1478   folic acid (FOLVITE) tablet 1 mg  1 mg Oral Daily Kirby Crigler, Mir M, MD   1 mg at 07/03/23 2956   guaiFENesin tablet 200 mg  200 mg Oral Q4H PRN Danford, Earl Lites, MD       guaiFENesin-codeine 100-10 MG/5ML solution 10 mL  10 mL Oral Q6H PRN Danford, Earl Lites, MD       guaiFENesin-dextromethorphan (ROBITUSSIN DM) 100-10 MG/5ML syrup 5 mL  5 mL Oral Q4H PRN Alberteen Sam, MD   5 mL at 07/03/23 2224   HYDROcodone-acetaminophen (NORCO) 7.5-325 MG per tablet 1 tablet  1 tablet Oral Q3H PRN Alberteen Sam, MD   1 tablet at 07/04/23 2130   leptospermum manuka honey (MEDIHONEY) paste 1  Application  1 Application Topical Daily Alberteen Sam, MD   1 Application at 07/03/23 8657   lip balm (CARMEX) ointment   Topical PRN Alberteen Sam, MD   1 Application at 06/27/23 1148   loperamide (IMODIUM) capsule 2 mg  2 mg Oral PRN Danford, Earl Lites, MD       magic mouthwash w/lidocaine  15 mL Oral TID PRN Danford, Earl Lites, MD       melatonin tablet 3 mg  3 mg Oral QHS Danford, Earl Lites, MD   3 mg at 07/03/23 2223   nicotine (NICODERM CQ - dosed in mg/24 hours) patch 21 mg  21 mg Transdermal Daily Kirby Crigler, Mir M, MD   21 mg at 07/03/23 0827   ondansetron (ZOFRAN) tablet 4 mg  4 mg Oral Q6H PRN Kirby Crigler, Mir M, MD       Or   ondansetron [facility]) injection 4 mg  4 mg Intravenous Q6H PRN Maryln Gottron, MD       Oral care mouth rinse  15 mL Mouth Rinse PRN Kirby Crigler, Mir M, MD       piperacillin-tazobactam (ZOSYN) IVPB 3.375 g  3.375 g Intravenous Q8H Alberteen Sam, MD 12.5 mL/hr at 07/04/23 0624 3.375 g at 07/04/23 8469   revefenacin (YUPELRI) nebulizer solution 175 mcg  175 mcg Nebulization Daily Martina Sinner, MD   175 mcg at 07/03/23 1030   saline (AYR) nasal gel with aloe 1 Application  1 Application Each Nare Q4H PRN Charlott Holler, MD       simethicone [facility]) chewable tablet 80 mg  80 mg Oral QID PRN Alberteen Sam, MD   80 mg at 07/02/23 1239   sodium chloride (OCEAN) 0.65 % nasal spray 1 spray  1 spray Each Nare PRN Charlott Holler, MD       sodium chloride flush (NS) 0.9 % injection 10-40 mL  10-40 mL Intracatheter PRN Kirby Crigler, Mir M, MD       sodium chloride flush (NS) 0.9 % injection 10-40 mL  10-40 mL Intracatheter PRN Maryln Gottron, MD  REVIEW OF SYSTEMS:   Constitutional: ( - ) fevers, ( - )  chills , ( - ) night sweats Eyes: ( - ) blurriness of vision, ( - ) double vision, ( - ) watery eyes Ears, nose, mouth, throat, and face: ( - ) mucositis, ( - ) sore throat Respiratory: (+ ) cough, ( + )  dyspnea, ( - ) wheezes Cardiovascular: ( - ) palpitation, ( - ) chest discomfort, ( - ) lower extremity swelling Gastrointestinal:  ( + ) nausea, ( - ) heartburn, ( -) change in bowel habits Skin: ( - ) abnormal skin rashes Lymphatics: ( - ) new lymphadenopathy, ( - ) easy bruising Neurological: ( - ) numbness, ( - ) tingling, ( - ) new weaknesses Behavioral/Psych: ( - ) mood change, ( - ) new changes  All other systems were reviewed with the patient and are negative.  PHYSICAL EXAMINATION: ECOG PERFORMANCE STATUS: 3 - Symptomatic, >50% confined to bed  There were no vitals filed for this visit.  There were no vitals filed for this visit.   GENERAL: Chronically ill-appearing middle-aged Caucasian female, alert, cachetic.  SKIN: skin color, texture, turgor are normal, no rashes or significant lesions EYES: conjunctiva are pink and non-injected, sclera clear LUNGS: clear to auscultation and percussion with normal breathing effort HEART: regular rate & rhythm and no murmurs and no lower extremity edema Musculoskeletal: no cyanosis of digits and no clubbing  PSYCH: alert & oriented x 3, fluent speech NEURO: no focal motor/sensory deficits  LABORATORY DATA:  I have reviewed the data as listed    Latest Ref Rng & Units 07/04/2023    3:31 AM 07/03/2023    5:16 AM 07/02/2023    4:33 AM  CBC  WBC 4.0 - 10.5 K/uL 8.0  8.1  8.2   Hemoglobin 12.0 - 15.0 g/dL 8.3  8.4  8.7   Hematocrit 36.0 - 46.0 % 29.8  29.8  30.4   Platelets 150 - 400 K/uL 197  197  208        Latest Ref Rng & Units 07/04/2023    3:31 AM 07/03/2023    5:16 AM 07/02/2023    4:33 AM  CMP  Glucose 70 - 99 mg/dL 64  65  73   BUN 8 - 23 mg/dL 20  20  21    Creatinine 0.44 - 1.00 mg/dL 1.61  0.96  0.45   Sodium 135 - 145 mmol/L 139  139  141   Potassium 3.5 - 5.1 mmol/L 3.5  3.5  3.4   Chloride 98 - 111 mmol/L 105  105  105   CO2 22 - 32 mmol/L 23  24  26    Calcium 8.9 - 10.3 mg/dL 8.3  8.2  8.5   Total Protein 6.5 - 8.1  g/dL 5.1     Total Bilirubin 0.0 - 1.2 mg/dL 1.0     Alkaline Phos 38 - 126 U/L 94     AST 15 - 41 U/L 17     ALT 0 - 44 U/L 11       RADIOGRAPHIC STUDIES: CT CHEST W CONTRAST Result Date: 06/27/2023 CLINICAL DATA:  Pneumonia, complications suspected. History of metastatic lung cancer. History of bladder cancer. * Tracking Code: BO * EXAM: CT CHEST WITH CONTRAST TECHNIQUE: Multidetector CT imaging of the chest was performed during intravenous contrast administration. RADIATION DOSE REDUCTION: This exam was performed according to the departmental dose-optimization program which includes automated exposure control, adjustment of the mA  and/or kV according to patient size and/or use of iterative reconstruction technique. CONTRAST:  75mL OMNIPAQUE IOHEXOL 300 MG/ML  SOLN COMPARISON:  CT of the chest, abdomen and pelvis 05/25/2023 and 02/06/2023. FINDINGS: Cardiovascular: No acute vascular findings are identified. Right IJ Port-A-Cath extends to the superior cavoatrial junction. There is atherosclerosis of the aorta, great vessels and coronary arteries. The heart size is normal. There is no pericardial effusion. Mediastinum/Nodes: Enlarging mediastinal lymph nodes suspicious for progressive metastatic disease. Subcarinal nodal mass measures approximately 2.9 x 2.0 cm on image 69/2 (previously 2.9 x 1.9 cm). 1.0 cm prevascular node on image 44/2 and 1.3 cm AP window node on image 51/2 has mildly progressed. Probable ill-defined right hilar adenopathy, difficult to differentiate from adjacent consolidated lung.Progressive left axillary and subpectoral adenopathy measuring up to 1.0 cm short axis on image 43/2. The thyroid gland, trachea and esophagus demonstrate no significant findings. Lungs/Pleura: Previously demonstrated heterogeneous enhancing mass in the right lower lobe is difficult to differentiate from adjacent lung and pleural fluid, although measures approximately 6.8 x 4.7 cm on image 83/2. There is  increased necrosis within this mass. The mass communicates with an increasingly organized right pleural fluid collection posteriorly, measuring up to 8.2 x 4.6 cm on image 116/2. There is increased air within this collection and increased peripheral enhancement. Increased consolidation and cavitation in the right middle lobe. New moderate-sized dependent left pleural effusion with new patchy airspace disease dependently in the left upper and lower lobes, most consistent with pneumonia. Underlying moderate to severe centrilobular and paraseptal emphysema. No pneumothorax. Upper abdomen: Large heterogeneous bilateral adrenal metastases are again noted, measuring up to 6.5 cm on the right and 6.5 cm on the left, mildly enlarged from previous CT. No definite metastatic disease identified within the visualized liver. Possible tumor extension into the IVC from the right adrenal gland. Musculoskeletal/Chest wall: Stable T8 compression deformity. There is a mildly progressive metastasis involving the posterior aspect of the left 11th rib. IMPRESSION: 1. Increased necrosis within the right lower lobe mass with increased organization of the adjacent right pleural fluid collection. There is increased air within this collection and increased peripheral enhancement, suspicious for empyema. 2. Increased consolidation and cavitation in the right middle lobe. New moderate-sized dependent left pleural effusion with new patchy airspace disease dependently in the left upper and lower lobes, most consistent with pneumonia. 3. Progressive metastatic disease with enlarging mediastinal and left axillary/subpectoral lymph nodes, enlarging necrotic right lower lobe mass and enlarging bilateral adrenal metastases. Possible tumor extension into the IVC from the right adrenal gland. Mildly progressive metastasis involving the posterior aspect of the left 11th rib. 4. Aortic Atherosclerosis (ICD10-I70.0) and Emphysema (ICD10-J43.9).  Electronically Signed   By: Carey Bullocks M.D.   On: 06/27/2023 11:38   DG Chest Portable 1 View Result Date: 06/25/2023 CLINICAL DATA:  Shortness of breath EXAM: PORTABLE CHEST 1 VIEW COMPARISON:  09/17/2019 FINDINGS: Porta catheter on the right with tip at the lower SVC. Bilateral consolidation superimposed on chronic opacity at the right base where there is a chronic mass and pleural fluid by most recent CT. No pneumothorax. Normal heart size. IMPRESSION: Bilateral pneumonia with underlying malignancy in the right lower chest. Electronically Signed   By: Tiburcio Pea M.D.   On: 06/25/2023 10:16    ASSESSMENT & PLAN Lauren Salazar is a 63 y.o. female with medical history significant for metastatic adenosquamous lung cancer presents for a follow up visit.  She is status post robotic assisted laparoscopic cystectomy  and pelvic exoneration including hysterectomy, bilateral salpingo-oophorectomy and ileoconduit diversion completed on February 01, 2019.  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   Radiation therapy with weekly chemotherapy utilizing carboplatin and paclitaxel.  Week 1 of chemotherapy started on May 14, 2019.  Therapy concluded in February 2021.   Durvalumab 10 mg/kg cycle 1 on Sep 03, 2019 every 14 days.  She completed 4 cycles of therapy.   Carboplatin with Alimta and Pembrolizumab started on August 04, 2020.  She completed 3 cycles of therapy in June 2022.   Pembrolizumab 400 mg every 6 weeks started on December 22, 2020.  Therapy discontinued and March 2023 due to patient's preference.  Progression noted on CT scan from 05/26/2022.   Completed palliative radiation to her R hip/pelvis on 07/01/2022. Received 30 Gy in 10 Fx.   Started Docetaxel and Ramucirumab on 07/12/2022.  # Metastatic Adenosquamous Carcinoma of the Lung  --patient has undergone numerous lines of treatment including chemoradiation followed by immunotherapy, subsequent  carbo/pem/pem and then monotherapy pembrolizumab --progression of disease noted on 05/26/2022 CT scan. --Started docetaxel plus ramucirumab on 07/12/2022 --Due to poor tolerance with Cycle 1, dose reduced Doxcetaxel from 75 mg/m2 to 60 mg/m2 starting Cycle 2.  --Due to persistent fatigue, dose reduced docetaxel from 60 mg/m2 to 50 mg/m2 starting Cycle 6 on 11/29/2022.  --Most CT scan from 11/18/2022 continued treatment response with stable RLL lung mass, decrease in size of right adrenal nodule, left retroperitoneal lymph node and sclerosis of bone metastases. No evidence of new or progressive disease.  Plan: --previously on Docetaxel/Ramucircumab, currently on hold due to worsening deconditioning.  --CT CAP from 05/25/2023 shows marked progression.  --Labs from today show WBC ***, Creatinine and LFTs in range.  --Re-discussed goals of care due to declining performance status, deconditioned status and continued weight loss.Patient is no longer a candidate for chemotherapy as it would more likely shorten her life than extend her life span.  She voiced understanding. --Patient agreed to following up with palliative care NP for continued goals of care discussion and referral to hospice care.  --Placeholder for follow up in 4 weeks but okay to cancel if patient enrolls in hospice care.   # Abdominal Pain/ Cancer Related Pain --Patient completed palliative radiation with radiation oncology to right hip/pelvis with improvement of pain --Patient currently has no pain medication so will bridge with oxycodone 10 mg q 4 hours until she follows up with palliative care team.   #Nausea: --Continue to take zofran and compazine as needed.   #Constipation: --Advised to take miralax daily with colace.   No orders of the defined types were placed in this encounter.   All questions were answered. The patient knows to call the clinic with any problems, questions or concerns.  A total of more than 40  minutes were  spent on this encounter with face-to-face time and non-face-to-face time, including preparing to see the patient, ordering tests and/or medications, counseling the patient and coordination of care as outlined above.   Ulysees Barns, MD Department of Hematology/Oncology [facility] Phone: [phone] Pager: [phone] Email: [email or web address]  07/04/2023 8:03 AM

## 2023-07-04 NOTE — Progress Notes (Signed)
 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    Palliative care has been asked to get involved to support additional goals of care conversations. She has been established with the [facility] Palliative Cancer clinic since 07/12/2022.  Today's Discussion 07/04/2023  *Please note that this is a verbal dictation therefore any spelling or grammatical errors are due to the "[employer]" system interpretation.  Chart reviewed inclusive of vital signs, progress notes, laboratory results, and diagnostic images.   Per patients RN, Lauren Salazar she feels that patients pain has been better controlled. She and I discussed that patient is now on 3LPM [place] and tolerating this well.   Lauren Salazar shares with me that her pain overall is improved. She noted that she has needed some additional oxycodone which helps her breakthrough pain. Discussed continuing the fentanyl patch and decreasing the norco dosing slightly so that Lauren Salazar does not get oversedated.   Lauren Salazar feels that her sleep is improved though she shares "a lot of interruptions" overnight and in the early morning. We discussed trying to minimize this further.   Loose bowel movements have slowed down per Lauren Salazar.   We discussed the plan for an increase in mobility today. I shared the importance of getting out of bed to the chair and also room mobility.  Lauren Salazar's shortness of breath has gradually improved per her self report.   Questions and concerns addressed/Palliative Support Provided.   Objective Assessment: Vital Signs Vitals:   07/04/23 0503 07/04/23 0907  BP: 109/71   Pulse: (!) 103   Resp: 16   Temp: 97.6 F (36.4 C)   SpO2: 100% 98%    Intake/Output Summary (Last 24 hours) at 07/04/2023 1104 Last data filed at 07/04/2023  0100 Gross per 24 hour  Intake 73.42 ml  Output 800 ml  Net -726.58 ml   Last Weight  Most recent update: 06/26/2023  1:49 AM    Weight  50 kg (110 lb 3.7 oz)            Gen:  Elderly Caucasian F in distress d/t pain HEENT: moist mucous membranes CV: Irregular rate and rhythm  PULM: On 5LPM HFNC ABD: soft/nontender  EXT: BUE edema R>L Neuro: Alert and oriented x3   SUMMARY OF RECOMMENDATIONS   Full code/ Full scope of care    Appreciate TOC helping arrange meals on discharge   Ongoing PMT support  Symptom Management: ____________________________________  Chronic Pain: - Added fentanyl patch 42mcg/hr Replace Q72H - Change Norco to 5mg /325mg  oral Q4H PRN - DC Fentanyl IVP Q2H PRN   Insomnia: - Melatonin 3mg  PO Qday - Can continue xanax PRN  Loose Bowels: - Not on any laxatives or stool softeners presently - Per RN it is not overtly malodorous - Nursing to maintain an eye on how much stool, it's quality, and frequency in the setting of antibiotics and immunocompromise  Generalized Weakness: - PT/OT - Daily mobility  Shortness of Breath: - PNA management per primary - Chest PT - Suction set up at bedside  Time Spent: 53 Billing based on MDM: High ______________________________________________________________________________________ [name] Palliative Medicine Team Team Cell Phone: [phone] Please utilize secure chat with additional questions, if there is no response within 30 minutes please call the above phone number  Palliative Medicine Team providers are available by phone  from 7am to 7pm daily and can be reached through the team cell phone.  Should this patient require assistance outside of these hours, please call the patient's attending physician.

## 2023-07-04 NOTE — Progress Notes (Signed)
 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 - Progression of disease noted on 05/26/2022 CT scan. - Chemotherapy with docetaxel and ramucirumab started 07/12/2022.  Last dose 01/31/2019 for cycle 8-day 1, discontinued due to severe congestion and cough. - CT of chest 06/27/2023 shows increased necrosis RLL mass and increased consolidation and cavitation R and L.  Shows progressive metastatic disease. - Recommend transition to hospice. - Follows with medical oncology/Dr. Leonides Schanz  Pneumonia Sepsis/leukocytosis -WBC is now stable 8.0, leukocytosis resolved - Continue antibiotics as ordered - Monitor fever curve   Abdominal pain Cancer related pain, worsening right hip pain - Status post palliative radiation to right hip and pelvis - Patient complains of worsening right hip pain. - Palliative team has been following closely  Anemia, normocytic -Likely due to malignancy and cancer treatments -Hemoglobin 8.3 today - Transfuse PRBC for Hgb <7.0.  No transfusional intervention required at this time. - Monitor CBC with differential  History of bladder cancer - Status post cystectomy and ileal conduit   Code Status Full  Subjective:  Patient seen awake and alert.  She is very ill-appearing states that she feels "very weak".  Also reports right hip pain and that she has no strength in her legs.  Objective:  Vitals:   07/04/23 0503 07/04/23 0907  BP: 109/71   Pulse: (!) 103   Resp: 16   Temp: 97.6 F (36.4 C)   SpO2: 100% 98%     Intake/Output Summary (Last 24 hours) at 07/04/2023 1037 Last data filed at 07/04/2023 0100 Gross per 24 hour  Intake 73.42 ml  Output 800 ml  Net -726.58 ml     REVIEW OF SYSTEMS:   Constitutional: + Fatigue + generalized  weakness, denies fevers, chills or abnormal night sweats Eyes: Denies blurriness of vision, double vision or watery eyes Ears, nose, mouth, throat, and face: Denies mucositis or sore throat Respiratory: Denies cough, dyspnea or wheezes Cardiovascular: Denies palpitation, chest discomfort or lower extremity swelling Gastrointestinal: + Poor appetite  skin: Denies abnormal skin rashes Lymphatics: Denies new lymphadenopathy or easy bruising Neurological: Denies numbness, tingling or new weaknesses Behavioral/Psych: Mood is stable, no new changes  All other systems were reviewed with the patient and are negative.  PHYSICAL EXAMINATION: ECOG PERFORMANCE STATUS: 3 - Symptomatic, >50% confined to bed  Vitals:   07/04/23 0503 07/04/23 0907  BP: 109/71   Pulse: (!) 103   Resp: 16   Temp: 97.6 F (36.4 C)   SpO2: 100% 98%   Filed Weights   06/26/23 0100  Weight: 110 lb 3.7 oz (50 kg)    GENERAL: alert, + very ill-appearing SKIN: + Pale skin color, texture, turgor are normal, no rashes or significant lesions EYES: normal, conjunctiva are pink and non-injected, sclera clear OROPHARYNX: no exudate, no erythema and lips, buccal mucosa, and tongue normal  NECK: supple, thyroid normal size, non-tender, without nodularity LYMPH: no palpable lymphadenopathy in the cervical, axillary or inguinal LUNGS: clear to auscultation and percussion with normal breathing effort HEART: regular rate & rhythm and no murmurs and no lower extremity edema ABDOMEN: abdomen soft, non-tender and normal bowel sounds MUSCULOSKELETAL: no cyanosis of digits and no clubbing  PSYCH: alert & oriented x 3 with fluent speech NEURO: no focal motor/sensory deficits   All questions were  answered. The patient knows to call the clinic with any problems, questions or concerns.   The total time spent in the appointment was 40 minutes encounter with patient including review of chart and various tests results, discussions about  plan of care and coordination of care plan  Dawson Bills, NP 07/04/2023 10:37 AM    Labs Reviewed:  Lab Results  Component Value Date   WBC 8.0 07/04/2023   HGB 8.3 (L) 07/04/2023   HCT 29.8 (L) 07/04/2023   MCV 89.8 07/04/2023   PLT 197 07/04/2023   Recent Labs    06/27/23 0253 06/28/23 0248 07/01/23 0500 07/02/23 0433 07/03/23 0516 07/04/23 0331  NA 132*   < > 140 141 139 139  K 3.5   < > 3.4* 3.4* 3.5 3.5  CL 102   < > 106 105 105 105  CO2 22   < > 26 26 24 23   GLUCOSE 83   < > 75 73 65* 64*  BUN 26*   < > 23 21 20 20   CREATININE 0.72   < > 0.84 0.79 0.78 0.78  CALCIUM 7.6*   < > 8.2* 8.5* 8.2* 8.3*  GFRNONAA >60   < > >60 >60 >60 >60  PROT 5.6*  --  5.2*  --   --  5.1*  ALBUMIN 1.8*  --  1.5*  --   --  1.6*  AST 24  --  15  --   --  17  ALT 19  --  13  --   --  11  ALKPHOS 82  --  78  --   --  94  BILITOT 0.6  --  0.5  --   --  1.0   < > = values in this interval not displayed.    Studies Reviewed:  CT CHEST W CONTRAST Result Date: 06/27/2023 CLINICAL DATA:  Pneumonia, complications suspected. History of metastatic lung cancer. History of bladder cancer. * Tracking Code: BO * EXAM: CT CHEST WITH CONTRAST TECHNIQUE: Multidetector CT imaging of the chest was performed during intravenous contrast administration. RADIATION DOSE REDUCTION: This exam was performed according to the departmental dose-optimization program which includes automated exposure control, adjustment of the mA and/or kV according to patient size and/or use of iterative reconstruction technique. CONTRAST:  75mL OMNIPAQUE IOHEXOL 300 MG/ML  SOLN COMPARISON:  CT of the chest, abdomen and pelvis 05/25/2023 and 02/06/2023. FINDINGS: Cardiovascular: No acute vascular findings are identified. Right IJ Port-A-Cath extends to the superior cavoatrial junction. There is atherosclerosis of the aorta, great vessels and coronary arteries. The heart size is normal. There is no pericardial effusion. Mediastinum/Nodes:  Enlarging mediastinal lymph nodes suspicious for progressive metastatic disease. Subcarinal nodal mass measures approximately 2.9 x 2.0 cm on image 69/2 (previously 2.9 x 1.9 cm). 1.0 cm prevascular node on image 44/2 and 1.3 cm AP window node on image 51/2 has mildly progressed. Probable ill-defined right hilar adenopathy, difficult to differentiate from adjacent consolidated lung.Progressive left axillary and subpectoral adenopathy measuring up to 1.0 cm short axis on image 43/2. The thyroid gland, trachea and esophagus demonstrate no significant findings. Lungs/Pleura: Previously demonstrated heterogeneous enhancing mass in the right lower lobe is difficult to differentiate from adjacent lung and pleural fluid, although measures approximately 6.8 x 4.7 cm on image 83/2. There is increased necrosis within this mass. The mass communicates with an increasingly organized right pleural fluid collection posteriorly, measuring up to 8.2 x 4.6 cm on image 116/2. There is increased air  within this collection and increased peripheral enhancement. Increased consolidation and cavitation in the right middle lobe. New moderate-sized dependent left pleural effusion with new patchy airspace disease dependently in the left upper and lower lobes, most consistent with pneumonia. Underlying moderate to severe centrilobular and paraseptal emphysema. No pneumothorax. Upper abdomen: Large heterogeneous bilateral adrenal metastases are again noted, measuring up to 6.5 cm on the right and 6.5 cm on the left, mildly enlarged from previous CT. No definite metastatic disease identified within the visualized liver. Possible tumor extension into the IVC from the right adrenal gland. Musculoskeletal/Chest wall: Stable T8 compression deformity. There is a mildly progressive metastasis involving the posterior aspect of the left 11th rib. IMPRESSION: 1. Increased necrosis within the right lower lobe mass with increased organization of the  adjacent right pleural fluid collection. There is increased air within this collection and increased peripheral enhancement, suspicious for empyema. 2. Increased consolidation and cavitation in the right middle lobe. New moderate-sized dependent left pleural effusion with new patchy airspace disease dependently in the left upper and lower lobes, most consistent with pneumonia. 3. Progressive metastatic disease with enlarging mediastinal and left axillary/subpectoral lymph nodes, enlarging necrotic right lower lobe mass and enlarging bilateral adrenal metastases. Possible tumor extension into the IVC from the right adrenal gland. Mildly progressive metastasis involving the posterior aspect of the left 11th rib. 4. Aortic Atherosclerosis (ICD10-I70.0) and Emphysema (ICD10-J43.9). Electronically Signed   By: Carey Bullocks M.D.   On: 06/27/2023 11:38   DG Chest Portable 1 View Result Date: 06/25/2023 CLINICAL DATA:  Shortness of breath EXAM: PORTABLE CHEST 1 VIEW COMPARISON:  09/17/2019 FINDINGS: Porta catheter on the right with tip at the lower SVC. Bilateral consolidation superimposed on chronic opacity at the right base where there is a chronic mass and pleural fluid by most recent CT. No pneumothorax. Normal heart size. IMPRESSION: Bilateral pneumonia with underlying malignancy in the right lower chest. Electronically Signed   By: Tiburcio Pea M.D.   On: 06/25/2023 10:16

## 2023-07-05 DIAGNOSIS — Z515 Encounter for palliative care: Secondary | ICD-10-CM | POA: Diagnosis not present

## 2023-07-05 DIAGNOSIS — R6521 Severe sepsis with septic shock: Secondary | ICD-10-CM | POA: Diagnosis not present

## 2023-07-05 DIAGNOSIS — Z7189 Other specified counseling: Secondary | ICD-10-CM | POA: Diagnosis not present

## 2023-07-05 DIAGNOSIS — J9601 Acute respiratory failure with hypoxia: Secondary | ICD-10-CM | POA: Diagnosis not present

## 2023-07-05 DIAGNOSIS — A419 Sepsis, unspecified organism: Secondary | ICD-10-CM | POA: Diagnosis not present

## 2023-07-05 LAB — COMPREHENSIVE METABOLIC PANEL
ALT: 12 U/L (ref 0–44)
AST: 18 U/L (ref 15–41)
Albumin: 1.5 g/dL — ABNORMAL LOW (ref 3.5–5.0)
Alkaline Phosphatase: 109 U/L (ref 38–126)
Anion gap: 9 (ref 5–15)
BUN: 17 mg/dL (ref 8–23)
CO2: 25 mmol/L (ref 22–32)
Calcium: 8.4 mg/dL — ABNORMAL LOW (ref 8.9–10.3)
Chloride: 105 mmol/L (ref 98–111)
Creatinine, Ser: 0.77 mg/dL (ref 0.44–1.00)
GFR, Estimated: >60 mL/min (ref >60)
Glucose, Bld: 94 mg/dL (ref 70–99)
Potassium: 3.2 mmol/L — ABNORMAL LOW (ref 3.5–5.1)
Sodium: 139 mmol/L (ref 135–145)
Total Bilirubin: 0.7 mg/dL (ref 0.0–1.2)
Total Protein: 5.5 g/dL — ABNORMAL LOW (ref 6.5–8.1)

## 2023-07-05 LAB — CBC
HCT: 29.6 % — ABNORMAL LOW (ref 36.0–46.0)
Hemoglobin: 8.6 g/dL — ABNORMAL LOW (ref 12.0–15.0)
MCH: 25.7 pg — ABNORMAL LOW (ref 26.0–34.0)
MCHC: 29.1 g/dL — ABNORMAL LOW (ref 30.0–36.0)
MCV: 88.4 fL (ref 80.0–100.0)
Platelets: 215 10*3/uL (ref 150–400)
RBC: 3.35 MIL/uL — ABNORMAL LOW (ref 3.87–5.11)
RDW: 21.6 % — ABNORMAL HIGH (ref 11.5–15.5)
WBC: 7.1 10*3/uL (ref 4.0–10.5)
nRBC: 0 % (ref 0.0–0.2)

## 2023-07-05 MED ORDER — HYDROCODONE-ACETAMINOPHEN 5-325 MG PO TABS
2.0000 | ORAL_TABLET | ORAL | Status: DC | PRN
  Administered 2023-07-06 – 2023-07-10 (×19): 2 via ORAL
  Filled 2023-07-05 (×20): qty 2

## 2023-07-05 MED ORDER — HYDROCODONE-ACETAMINOPHEN 5-325 MG PO TABS: 2.0000 | ORAL_TABLET | ORAL | Status: DC | PRN

## 2023-07-05 MED ORDER — FENTANYL CITRATE PF 50 MCG/ML IJ SOSY: 25.0000 ug | PREFILLED_SYRINGE | INTRAMUSCULAR | Status: AC | PRN

## 2023-07-05 MED ORDER — HYDROCODONE-ACETAMINOPHEN 5-325 MG PO TABS
1.0000 | ORAL_TABLET | ORAL | Status: DC | PRN
  Administered 2023-07-05 – 2023-07-10 (×7): 1 via ORAL
  Filled 2023-07-05 (×8): qty 1

## 2023-07-05 NOTE — NC FL2 (Signed)
 [place] MEDICAID FL2 LEVEL OF CARE FORM     IDENTIFICATION  Patient Name: Lauren Salazar Birthdate: [birth year] Sex: female Admission Date (Current Location): 06/25/2023  [facility] and [place] Number:  Producer, television/film/video and Address:         Provider Number: [id number]  Attending Physician Name and Address:  Narda Bonds, MD  Relative Name and Phone Number:  Roderic Scarce Deakins(dtr)[phone]    Current Level of Care: Hospital Recommended Level of Care: Skilled Nursing Facility Prior Approval Number:    Date Approved/Denied:   PASRR Number: [id number]  Discharge Plan: SNF    Current Diagnoses: Patient Active Problem List   Diagnosis Date Noted   Palliative care by specialist 07/02/2023   Pressure injury of skin 06/26/2023   Iron deficiency anemia 06/26/2023   Acute hypoxemic respiratory failure (HCC) 06/26/2023   Septic shock due to pneumonia 06/25/2023   Port-A-Cath in place 10/04/2022   Chronic pain 07/27/2020   Hardening of the aorta (main artery of the heart) (HCC) 07/27/2020   Sciatica 07/27/2020   Tobacco user 07/27/2020   Goals of care, counseling/discussion 07/22/2020   Cancer of lower lobe of right lung (HCC) 04/02/2019   Lung cancer (HCC) 03/14/2019   Bladder cancer (HCC) 02/01/2019    Orientation RESPIRATION BLADDER Height & Weight     Self, Time, Situation, Place  O2 Continent Weight: 50 kg Height:  5\' 6"  (167.6 cm)  BEHAVIORAL SYMPTOMS/MOOD NEUROLOGICAL BOWEL NUTRITION STATUS      Continent Diet (Regular)  AMBULATORY STATUS COMMUNICATION OF NEEDS Skin   Limited Assist Verbally PU Stage and Appropriate Care (R buttock/Sacrum wound caresee d/c summary-foam dsg)   PU Stage 2 Dressing: Daily                   Personal Care Assistance Level of Assistance  Bathing, Feeding, Dressing Bathing Assistance: Limited assistance Feeding assistance: Limited assistance Dressing Assistance: Limited assistance     Functional Limitations  Info  Sight, Hearing, Speech Sight Info: Impaired (Readers) Hearing Info: Adequate Speech Info: Adequate    SPECIAL CARE FACTORS FREQUENCY  PT (By licensed PT), OT (By licensed OT)     PT Frequency: 5x week OT Frequency: 5x week            Contractures Contractures Info: Not present    Additional Factors Info  Code Status, Allergies Code Status Info: Full Allergies Info: Morphine Sulfate, Celecoxib           Current Medications (07/05/2023):  This is the current hospital active medication list Current Facility-Administered Medications  Medication Dose Route Frequency Provider Last Rate Last Admin   albuterol (PROVENTIL) (2.5 MG/3ML) 0.083% nebulizer solution 2.5 mg  2.5 mg Nebulization Q2H PRN Kirby Crigler, Mir M, MD   2.5 mg at 06/27/23 1027   ALPRAZolam Prudy Feeler) tablet 0.5 mg  0.5 mg Oral Daily PRN Alberteen Sam, MD   0.5 mg at 07/04/23 2203   alum & mag hydroxide-simeth (MAALOX/MYLANTA) 200-200-20 MG/5ML suspension 30 mL  30 mL Oral Q4H PRN Danford, Earl Lites, MD       arformoterol (BROVANA) nebulizer solution 15 mcg  15 mcg Nebulization BID Alberteen Sam, MD   15 mcg at 07/05/23 0806   bismuth subsalicylate (PEPTO BISMOL) 262 MG/15ML suspension 30 mL  30 mL Oral Q4H PRN Alberteen Sam, MD   30 mL at 07/02/23 2209   budesonide (PULMICORT) nebulizer solution 0.5 mg  0.5 mg Nebulization BID Martina Sinner, MD  0.5 mg at 07/05/23 0806   Chlorhexidine Gluconate Cloth 2 % PADS 6 each  6 each Topical Q0600 Maryln Gottron, MD   6 each at 07/04/23 1153   enoxaparin (LOVENOX) injection 40 mg  40 mg Subcutaneous Q24H Kirby Crigler, Mir M, MD   40 mg at 07/04/23 2203   feeding supplement (ENSURE ENLIVE / ENSURE PLUS) liquid 237 mL  237 mL Oral BID BM Danford, Earl Lites, MD   237 mL at 07/05/23 1007   fentaNYL (DURAGESIC) 12 MCG/HR 1 patch  1 patch Transdermal Q72H Ernie Avena, NP   1 patch at 07/03/23 0825   fentaNYL (SUBLIMAZE) injection  25 mcg  25 mcg Intravenous Q4H PRN Ernie Avena, NP       ferrous sulfate tablet 325 mg  325 mg Oral QODAY Alberteen Sam, MD   325 mg at 07/04/23 1015   folic acid (FOLVITE) tablet 1 mg  1 mg Oral Daily Kirby Crigler, Mir M, MD   1 mg at 07/05/23 1006   guaiFENesin tablet 200 mg  200 mg Oral Q4H PRN Danford, Earl Lites, MD       guaiFENesin-codeine 100-10 MG/5ML solution 10 mL  10 mL Oral Q6H PRN Danford, Earl Lites, MD   10 mL at 07/05/23 1009   guaiFENesin-dextromethorphan (ROBITUSSIN DM) 100-10 MG/5ML syrup 5 mL  5 mL Oral Q4H PRN Alberteen Sam, MD   5 mL at 07/04/23 2203   HYDROcodone-acetaminophen (NORCO/VICODIN) 5-325 MG per tablet 1 tablet  1 tablet Oral Q4H PRN Ernie Avena, NP       HYDROcodone-acetaminophen (NORCO/VICODIN) 5-325 MG per tablet 2 tablet  2 tablet Oral Q4H PRN Ferolito, Albertina [place], NP       leptospermum manuka honey (MEDIHONEY) paste 1 Application  1 Application Topical Daily Danford, Earl Lites, MD   1 Application at 07/04/23 1154   lip balm (CARMEX) ointment   Topical PRN Alberteen Sam, MD   1 Application at 06/27/23 1148   loperamide (IMODIUM) capsule 2 mg  2 mg Oral PRN Danford, Earl Lites, MD       magic mouthwash w/lidocaine  15 mL Oral TID PRN Danford, Earl Lites, MD       melatonin tablet 3 mg  3 mg Oral QHS Danford, Earl Lites, MD   3 mg at 07/04/23 2203   nicotine (NICODERM CQ - dosed in mg/24 hours) patch 21 mg  21 mg Transdermal Daily Kirby Crigler, Mir M, MD   21 mg at 07/05/23 1006   ondansetron (ZOFRAN) tablet 4 mg  4 mg Oral Q6H PRN Kirby Crigler, Mir M, MD       Or   ondansetron [facility]) injection 4 mg  4 mg Intravenous Q6H PRN Kirby Crigler, Mir M, MD       Oral care mouth rinse  15 mL Mouth Rinse PRN Kirby Crigler, Mir M, MD       revefenacin (YUPELRI) nebulizer solution 175 mcg  175 mcg Nebulization Daily Melody Comas B, MD   175 mcg at 07/05/23 0806   saline (AYR) nasal gel with aloe 1 Application  1  Application Each Nare Q4H PRN Charlott Holler, MD       simethicone [facility]) chewable tablet 80 mg  80 mg Oral QID PRN Alberteen Sam, MD   80 mg at 07/02/23 1239   sodium chloride (OCEAN) 0.65 % nasal spray 1 spray  1 spray Each Nare PRN Charlott Holler, MD       sodium  chloride flush (NS) 0.9 % injection 10-40 mL  10-40 mL Intracatheter PRN Kirby Crigler, Mir M, MD       sodium chloride flush (NS) 0.9 % injection 10-40 mL  10-40 mL Intracatheter PRN Maryln Gottron, MD         Discharge Medications: Please see discharge summary for a list of discharge medications.  Relevant Imaging Results:  Relevant Lab Results:   Additional Information ss#[phone];No chemo or radiation.  Lanier Clam, RN

## 2023-07-05 NOTE — Progress Notes (Signed)
 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    Palliative care has been asked to get involved to support additional goals of care conversations. She has been established with the OP Palliative Cancer clinic since 07/12/2022.  Today's Discussion 07/05/2023  *Please note that this is a verbal dictation therefore any spelling or grammatical errors are due to the "[employer] One" system interpretation.  Chart reviewed inclusive of vital signs, progress notes, laboratory results, and diagnostic images.   I met with Lauren Salazar at bedside this morning. She is awake and oriented. She and I reviewed the notes left by the Oncology team. I shared with Lauren Salazar that per note review it appears that she is not a candidate for additional chemotherapy and that hospice is strongly advised. Lauren Salazar states that no one told her this. She expresses great surprise by this information. I was able to read the note out loud for her to better understand these recommendations.   Lauren Salazar at this time notes that she feels too well for hospice. She shares that she continues to improve from the perspective of breathing daily. She does not think that she is at a point where hospice would benefit her and that her hopes are to go to rehabilitation. I allowed her time and space to express her wishes.  I was able to define hospice and what it is. I shared the ways in which hospice could support her as she encroaches end of life. Lauren Salazar was receptive to hearing this but again noted she is not ready for hospice and that she will know when it is time to pursue that avenue of care.  We further discussed resuscitation status. I shared again, Lauren Salazar would not fair well in a full resuscitative effort and I strongly recommend  considering DNAR/DNI.   I acknowledged the difficulty of the decisions being placed on Lauren Salazar and how hard it is to come to terms with her present situation.   We discussed symptom management concerns. Right now, Lauren Salazar shares her biggest issue has been pain though overall she does feel some improvement. She was confused about the fentanyl patch versus injections. There is some relief with injections though it is rather short she estimates about 15 minutes or so. Reviewed increasing PRN norco and possibly increasing fentanyl patch dosing in the oncoming days.   Bowel movement frequency has improved.   Sleep has improved.  Overall Lauren Salazar is doing better. _______________________________  Addendum:  I called and spoke with patients daughter, Lauren Salazar we discussed patients clinical status and the conversations as above. Lauren Salazar recognizes that her mother is having a tough time coming to terms with her present clinical prognosis. She shares that her mother does have good understanding of her disease. She is in agreement to pursuing what Lauren Salazar wants.   Questions and concerns addressed/Palliative Support Provided.   Objective Assessment: Vital Signs Vitals:   07/05/23 0225 07/05/23 0806  BP: (!) 100/59   Pulse: (!) 105   Resp: 18   Temp: 98 F (36.7 C)   SpO2: 96% 94%    Intake/Output Summary (Last 24 hours) at 07/05/2023 1016 Last data filed at 07/05/2023 0455 Gross per 24 hour  Intake 300 ml  Output 780 ml  Net -480 ml   Last Weight  Most recent update: 06/26/2023  1:49 AM  Weight  50 kg (110 lb 3.7 oz)            Gen:  Elderly Caucasian F in distress d/t pain HEENT: moist mucous membranes CV: Irregular rate and rhythm  PULM: On 2LPM HFNC ABD: soft/nontender  EXT: BUE edema R>L Neuro: Alert and oriented x3   SUMMARY OF RECOMMENDATIONS   Full code/ Full scope of care   Open and honest conversations held in the setting of patients disease (met. Cancer),  inability to get further tx, and recommendation of hospice  Patient shares she is not ready for hospice   Appreciate TOC helping arrange meals on discharge  Patient would like to go to SNF for additional rehab   Ongoing PMT support  Symptom Management: ____________________________________  Chronic Pain: - Continue fentanyl patch 51mcg/hr Replace Q72H - Change Norco to 5mg /325mg  oral Q4H PRN 1 Tab for moderate 2 Tab for severe - DC Fentanyl IVP Q2H PRN   Insomnia: - Melatonin 3mg  PO Qday - Can continue xanax PRN  Loose Bowels: - IMproved  Generalized Weakness: - PT/OT - Daily mobility - Plan for SNF  Shortness of Breath: - PNA management per primary - Chest PT - Suction set up at bedside  Time Spent: 70 Billing based on MDM: High ______________________________________________________________________________________ Lamarr Lulas [facility] Palliative Medicine Team Team Cell Phone: [phone] Please utilize secure chat with additional questions, if there is no response within 30 minutes please call the above phone number  Palliative Medicine Team providers are available by phone from 7am to 7pm daily and can be reached through the team cell phone.  Should this patient require assistance outside of these hours, please call the patient's attending physician.

## 2023-07-05 NOTE — Progress Notes (Signed)
 Physical Therapy Treatment Patient Details Name: Lauren Salazar MRN: [id number] DOB: 1960-08-26 Today's Date: 07/05/2023   History of Present Illness Pt is a 63 year old woman admitted on 06/25/23 with shortness of breath. + septic shock due to PNA, R L lower lobe mass with necrosis, Pt with new sacral and R buttocks pressure wounds. 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    PT Comments  The patient received seated in recliner, turned to offset pressure on buttocks. Patient tolerated standing at RW x ~ 2 mins and stepped back to bed. Repositioned in bed.  Recommend low air los replacement mattress for the bed. She has an air cushion for the chiar and  did place under her buttocks after return to bed.  Patient maintained on 4 L, SPO2 89%, HR 113-127.   If plan is discharge home, recommend the following: Two people to help with walking and/or transfers;Two people to help with bathing/dressing/bathroom;Assist for transportation;Help with stairs or ramp for entrance   Can travel by private vehicle     No  Equipment Recommendations  None recommended by PT    Recommendations for Other Services       Precautions / Restrictions Precautions Precautions: Fall Precaution/Restrictions Comments: watch HR and O2, painful sacral sore Restrictions Weight Bearing Restrictions Per Provider Order: No     Mobility  Bed Mobility           Sit to supine: Min assist   General bed mobility comments: increased time, min A LE mgt    Transfers   Equipment used: Rolling walker (2 wheels) Transfers: Sit to/from Stand, Bed to chair/wheelchair/BSC Sit to Stand: Min assist, +2 physical assistance   Step pivot transfers: Min assist       General transfer comment: Pt stood from chair  to RW and stood for duration for RN to change dressing, pt requesting to go back to bed    Ambulation/Gait                    Stairs             Wheelchair Mobility     Tilt Bed    Modified Rankin (Stroke Patients Only)       Balance Overall balance assessment: Needs assistance Sitting-balance support: Bilateral upper extremity supported, Feet supported Sitting balance-Leahy Scale: Fair     Standing balance support: Bilateral upper extremity supported, During functional activity Standing balance-Leahy Scale: Poor                              Communication Communication Communication: No apparent difficulties  Cognition Arousal: Alert Behavior During Therapy: WFL for tasks assessed/performed, Flat affect, Restless   PT - Cognitive impairments: No apparent impairments                         Following commands: Intact      Cueing Cueing Techniques: Verbal cues  Exercises      General Comments        Pertinent Vitals/Pain Pain Assessment Faces Pain Scale: Hurts worst Pain Location: sacrum/buttocks, low back Pain Descriptors / Indicators: Discomfort, Grimacing Pain Intervention(s): Limited activity within patient's tolerance, Monitored during session, Premedicated before session, Repositioned    Home Living  Prior Function            PT Goals (current goals can now be found in the care plan section) Progress towards PT goals: Progressing toward goals    Frequency    Min 2X/week      PT Plan      Co-evaluation PT/OT/SLP Co-Evaluation/Treatment: Yes Reason for Co-Treatment: For patient/therapist safety;Other (comment) (Pt poor acivity tolerance) PT goals addressed during session: Mobility/safety with mobility OT goals addressed during session: ADL's and self-care;Proper use of Adaptive equipment and DME      AM-PAC PT "6 Clicks" Mobility   Outcome Measure  Help needed turning from your back to your side while in a flat bed without using bedrails?: A Little Help needed moving from lying on your  back to sitting on the side of a flat bed without using bedrails?: A Lot Help needed moving to and from a bed to a chair (including a wheelchair)?: A Little Help needed standing up from a chair using your arms (e.g., wheelchair or bedside chair)?: A Lot Help needed to walk in hospital room?: Total Help needed climbing 3-5 steps with a railing? : Total 6 Click Score: 12    End of Session Equipment Utilized During Treatment: Oxygen Activity Tolerance: Patient limited by fatigue Patient left: in bed;with call bell/phone within reach;with bed alarm set Nurse Communication: Mobility status PT Visit Diagnosis: Unsteadiness on feet (R26.81);Muscle weakness (generalized) (M62.81);History of falling (Z91.81);Pain     Time: 0630-1601 PT Time Calculation (min) (ACUTE ONLY): 18 min  Charges:      PT General Charges $$ ACUTE PT VISIT: 1 Visit                     Blanchard Kelch PT Acute Rehabilitation Services Office [phone]     Rada Hay 07/05/2023, 4:36 PM

## 2023-07-05 NOTE — TOC Progression Note (Signed)
 Transition of Care [facility]) - Progression Note    Patient Details  Name: Lauren Salazar MRN: [id number] Date of Birth: 1960/05/24  [facility] CM/SW Contact  Kawanda Drumheller, Olegario Messier, RN Phone Number: 07/05/2023, 10:39 AM  Clinical Narrative: PT recc [facility] SNF-patient/dtr in agreement to fax out. Await bed offers.      Expected Discharge Plan: Skilled Nursing Facility Barriers to Discharge: Continued Medical Work up  Expected Discharge Plan and Services In-house Referral: Clinical Social Work     Living arrangements for the past 2 months: Single Family Home                                       Social Determinants of Health (SDOH) Interventions SDOH Screenings   Food Insecurity: No Food Insecurity (06/26/2023)  Housing: Low Risk  (06/26/2023)  Transportation Needs: No Transportation Needs (06/26/2023)  Utilities: Not At Risk (06/26/2023)  Tobacco Use: High Risk (06/25/2023)    Readmission Risk Interventions     No data to display

## 2023-07-05 NOTE — Progress Notes (Signed)
 Occupational Therapy Treatment Patient Details Name: Lauren Salazar MRN: [id number] DOB: Sep 02, 1960 Today's Date: 07/05/2023   History of present illness Pt is a 63 year old woman admitted on 06/25/23 with shortness of breath. + septic shock due to PNA, R L lower lobe mass with necrosis, Pt with new sacral and R buttocks pressure wounds. 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   OT comments  Pt making slow progress with functional goals. Pt in chair upon arrival. Per RN, pt up ~2 hours today. Pt completed sit - stand min A +2, stood at [facility] for RN to change dressing; pt step pivot min A toward rail/HOB, required min A with LEs onto bed and total A to elevate buttocks to best position pt on pressure relief mat; therapists inquired about air mattress to RN. Pt's O2 SATs 89% and HR 127 once returned to supine, deceasing to 113. Pt fatigues ery easily; is planing to d/c to SNF for rehab after acute care stay. OT will continue to follow acutely      If plan is discharge home, recommend the following:  A lot of help with walking and/or transfers;A lot of help with bathing/dressing/bathroom;Assistance with cooking/housework;Assist for transportation;Help with stairs or ramp for entrance   Equipment Recommendations  BSC/3in1;Tub/shower seat    Recommendations for Other Services      Precautions / Restrictions Precautions Precautions: Fall Precaution/Restrictions Comments: watch HR and O2 Restrictions Weight Bearing Restrictions Per Provider Order: No       Mobility Bed Mobility Overal bed mobility: Needs Assistance Bed Mobility: Supine to Sit       Sit to supine: Min assist   General bed mobility comments: increased time, min A LE mgt    Transfers                         Balance Overall balance assessment: Needs assistance Sitting-balance support: Bilateral upper extremity supported,  Feet supported Sitting balance-Leahy Scale: Fair     Standing balance support: Bilateral upper extremity supported, During functional activity Standing balance-Leahy Scale: Poor                             ADL either performed or assessed with clinical judgement   ADL Overall ADL's : Needs assistance/impaired     Grooming: Wash/dry hands;Wash/dry face;Set up;Bed level                   Toilet Transfer: Minimal assistance;Rolling walker (2 wheels);Stand-pivot Statistician Details (indicate cue type and reason): simulated chair<>bed           General ADL Comments: Pt in chair upon arrival. Per RN pt OOB ~2 hours today. Pt stood at [employer] with Min A +2 sit - stand while RN changed posterior dressing. Pt requested to return to bed    Extremity/Trunk Assessment Upper Extremity Assessment Upper Extremity Assessment: Generalized weakness   Lower Extremity Assessment Lower Extremity Assessment: Defer to PT evaluation   Cervical / Trunk Assessment Cervical / Trunk Assessment: Kyphotic    Vision Baseline Vision/History: 1 Wears glasses Ability to See in Adequate Light: 0 Adequate Patient Visual Report: No change from baseline     Perception     Praxis     Communication Communication Communication: No apparent difficulties   Cognition Arousal: Alert Behavior During Therapy: [facility] for tasks  assessed/performed, Flat affect                                 Following commands: Intact        Cueing   Cueing Techniques: Verbal cues  Exercises      Shoulder Instructions       General Comments      Pertinent Vitals/ Pain       Pain Assessment Pain Assessment: Faces Faces Pain Scale: Hurts even more Pain Location: sacrum/buttocks, low back Pain Descriptors / Indicators: Discomfort, Grimacing Pain Intervention(s): Monitored during session, Premedicated before session, Limited activity within patient's tolerance, Repositioned  Home  Living                                          Prior Functioning/Environment              Frequency  Min 2X/week        Progress Toward Goals  OT Goals(current goals can now be found in the care plan section)  Progress towards OT goals: OT to reassess next treatment     Plan      Co-evaluation    PT/OT/SLP Co-Evaluation/Treatment: Yes Reason for Co-Treatment: For patient/therapist safety;Other (comment) (Pt poor acivity tolerance)   OT goals addressed during session: ADL's and self-care;Proper use of Adaptive equipment and DME      AM-PAC OT "6 Clicks" Daily Activity     Outcome Measure   Help from another person eating meals?: None Help from another person taking care of personal grooming?: A Little Help from another person toileting, which includes using toliet, bedpan, or urinal?: Total Help from another person bathing (including washing, rinsing, drying)?: A Lot Help from another person to put on and taking off regular upper body clothing?: A Little Help from another person to put on and taking off regular lower body clothing?: Total 6 Click Score: 14    End of Session Equipment Utilized During Treatment: Rolling walker (2 wheels)  OT Visit Diagnosis: Unsteadiness on feet (R26.81);Muscle weakness (generalized) (M62.81);History of falling (Z91.81);Other symptoms and signs involving cognitive function;Pain Pain - part of body:  (buttocks, back)   Activity Tolerance Patient limited by fatigue   Patient Left in bed;with call bell/phone within reach   Nurse Communication Mobility status        Time: 4098-1191 OT Time Calculation (min): 18 min  Charges: OT General Charges $OT Visit: 1 Visit OT Treatments $Therapeutic Activity: 8-22 mins   Galen Manila 07/05/2023, 4:09 PM

## 2023-07-05 NOTE — Progress Notes (Signed)
   07/05/23 1442  Spiritual Encounters  Type of Visit Initial  Care provided to: Patient  Conversation partners present during encounter Nurse  Reason for visit Routine spiritual support  OnCall Visit No   Visited with patient to offer spiritual care. Patient spoke briefly, however she was not wanting a visit on this day as she was not feeling well and was having trouble hearing as she stated she had water in her ears. Advised patient of the availability of support from the spiritual care team.

## 2023-07-05 NOTE — Progress Notes (Signed)
 PROGRESS NOTE    Lauren Salazar  WUX:[id number] DOB: January 29, 1961 DOA: 06/25/2023 PCP: [facility] [place]   Brief Narrative: 63 y.o. 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   Significant events: 3/2: Admitted to SDU for sepsis 3/3: Escalated to HHFNC 40L; transfused 1u PRBCs 3/4: CT chest showed enlarging lung mass, communicating with complex pleural effusion, pulm consulted 3/5: Transfused second unit PRBCs 3/6: Still HHFNC 40L 3/7: Weaned to 30L 3/8: Weaned to 5-6L 3/9: Weaned to 2-4L, transferred OOU 3/11: Stable, very weak   Significant studies: 3/4 CT chest: 6x4cm necrotic lung mass, progressive adenopathy, organized right pleural fluid collection   Significant microbiology data: 3/2 COVID/flu/RSV PCR: negative 3/2 Blood culture x2: NGTD 3/3 MRSA nares: negative 3/4 RVP PCR: negative 3/4 Sputum: Normal respiratory flora    Procedures: --    Consults: CCM Palliative Oncology   Assessment and Plan:  Septic shock Present on admission, secondary to pneumonia and with a lactic acid as high as 5.9. Patient managed with IV fluids, without need for vasopressor support. Blood cultures obtained and were negative for infection. Patient managed via antibiotics for pneumonia with improvement.  Necrotizing right lower lobe lung mass Pneumonia Organizing right pleural effusion CT imaging significant for organized parapneumonic area, largely made of tumor. Pulmonology consulted for chest tube, which was deferred. Patient managed empirically Zosyn and completed treatment.  Acute respiratory failure with hypoxia Secondary to above. Patient requiring as much as 50 L/min via HHFNC. Weaned to 2 L.min of [place].  Lung cancer Metastasis to bone and adrenal glands Patient with history of palliative radiation, in  addition to chemotherapy and immunotherapy. Treatment stopped secondary to side effects and worsening functional status per medical oncology, patient is appropriate for hospice at this time as she is no longer a candidate for chemotherapy. Palliative care consulted. Patient, at this point, is not ready to accept hospice services. Continues to desire full scope medical care.  Diarrhea Iatrogenic. Resolved.     Pressure Injury 06/26/23 Buttocks Right Stage 3 -  Full thickness tissue loss. Subcutaneous fat may be visible but bone, tendon or muscle are NOT exposed. yellow wound bed (Active)  06/26/23 0100  Location: Buttocks  Location Orientation: Right  Staging: Stage 3 -  Full thickness tissue loss. Subcutaneous fat may be visible but bone, tendon or muscle are NOT exposed.  Wound Description (Comments): yellow wound bed  Present on Admission: Yes  Dressing Type Foam - Lift dressing to assess site every shift 07/05/23 0810     Pressure Injury 06/26/23 Coccyx Unstageable - Full thickness tissue loss in which the base of the injury is covered by slough (yellow, tan, gray, green or brown) and/or eschar (tan, brown or black) in the wound bed. unstageable, yellowish green wound bed (Active)  06/26/23 0100  Location: Coccyx  Location Orientation:   Staging: Unstageable - Full thickness tissue loss in which the base of the injury is covered by slough (yellow, tan, gray, green or brown) and/or eschar (tan, brown or black) in the wound bed.  Wound Description (Comments): unstageable, yellowish green wound bed  Present on Admission: Yes  Dressing Type Foam - Lift dressing to assess site every shift 07/04/23 2355    DVT prophylaxis: Lovenox Code Status:   Code Status: Full Code Family Communication: None at bedside Disposition Plan: Discharge to SNF when bed is  available   Consultants:    Procedures:    Antimicrobials:     Subjective: Patient reports no specific issues this  afternoon.  Objective: BP 125/81 (BP Location: Right Arm)   Pulse (!) 111   Temp 97.9 F (36.6 C) (Oral)   Resp (!) 22   Ht 5\' 6"  (1.676 m)   Wt 50 kg   SpO2 92%   BMI 17.79 kg/m   Examination:  General exam: Appears calm and comfortable. Chronically ill appearing. Respiratory system: Clear to auscultation. Respiratory effort normal. Cardiovascular system: S1 & S2 heard, RRR. Gastrointestinal system: Abdomen is nondistended, soft and nontender. Normal bowel sounds heard. Central nervous system: Alert and oriented. No focal neurological deficits. Psychiatry: Judgement and insight appear normal. Mood & affect appropriate.    Data Reviewed: I have personally reviewed following labs and imaging studies  CBC Lab Results  Component Value Date   WBC 7.1 07/05/2023   RBC 3.35 (L) 07/05/2023   HGB 8.6 (L) 07/05/2023   HCT 29.6 (L) 07/05/2023   MCV 88.4 07/05/2023   MCH 25.7 (L) 07/05/2023   PLT 215 07/05/2023   MCHC 29.1 (L) 07/05/2023   RDW 21.6 (H) 07/05/2023   LYMPHSABS 0.5 (L) 06/25/2023   MONOABS 0.8 06/25/2023   EOSABS 0.0 06/25/2023   BASOSABS 0.1 06/25/2023     Last metabolic panel Lab Results  Component Value Date   NA 139 07/05/2023   K 3.2 (L) 07/05/2023   CL 105 07/05/2023   CO2 25 07/05/2023   BUN 17 07/05/2023   CREATININE 0.77 07/05/2023   GLUCOSE 94 07/05/2023   GFRNONAA >60 07/05/2023   GFRAA >60 11/26/2019   CALCIUM 8.4 (L) 07/05/2023   PROT 5.5 (L) 07/05/2023   ALBUMIN 1.5 (L) 07/05/2023   BILITOT 0.7 07/05/2023   ALKPHOS 109 07/05/2023   AST 18 07/05/2023   ALT 12 07/05/2023   ANIONGAP 9 07/05/2023    GFR: Estimated Creatinine Clearance: 56.1 mL/min (by C-G formula based on SCr of 0.77 mg/dL).  Recent Results (from the past 240 hours)  MRSA Next Gen by PCR, Nasal     Status: None   Collection Time: 06/26/23  1:02 AM   Specimen: Nasal Mucosa; Nasal Swab  Result Value Ref Range Status   MRSA by PCR Next Gen NOT DETECTED NOT DETECTED  Final    Comment: (NOTE) The GeneXpert MRSA Assay (FDA approved for NASAL specimens only), is one component of a comprehensive MRSA colonization surveillance program. It is not intended to diagnose MRSA infection nor to guide or monitor treatment for MRSA infections. Test performance is not FDA approved in patients less than 81 years old. Performed at [facility], [address] [place], [place] [address]   Respiratory (~20 pathogens) panel by PCR     Status: None   Collection Time: 06/27/23 10:23 AM   Specimen: Nasopharyngeal Swab; Respiratory  Result Value Ref Range Status   Adenovirus NOT DETECTED NOT DETECTED Final   Coronavirus 229E NOT DETECTED NOT DETECTED Final    Comment: (NOTE) The Coronavirus on the Respiratory Panel, DOES NOT test for the novel  Coronavirus (2019 nCoV)    Coronavirus HKU1 NOT DETECTED NOT DETECTED Final   Coronavirus NL63 NOT DETECTED NOT DETECTED Final   Coronavirus OC43 NOT DETECTED NOT DETECTED Final   Metapneumovirus NOT DETECTED NOT DETECTED Final   Rhinovirus / Enterovirus NOT DETECTED NOT DETECTED Final   Influenza A NOT DETECTED NOT DETECTED Final   Influenza B NOT  DETECTED NOT DETECTED Final   Parainfluenza Virus 1 NOT DETECTED NOT DETECTED Final   Parainfluenza Virus 2 NOT DETECTED NOT DETECTED Final   Parainfluenza Virus 3 NOT DETECTED NOT DETECTED Final   Parainfluenza Virus 4 NOT DETECTED NOT DETECTED Final   Respiratory Syncytial Virus NOT DETECTED NOT DETECTED Final   Bordetella pertussis NOT DETECTED NOT DETECTED Final   Bordetella Parapertussis NOT DETECTED NOT DETECTED Final   Chlamydophila pneumoniae NOT DETECTED NOT DETECTED Final   Mycoplasma pneumoniae NOT DETECTED NOT DETECTED Final    Comment: Performed at [facility], [address], [place], [place] [address]  Expectorated Sputum Assessment w Gram Stain, Rflx to Resp Cult     Status: None   Collection Time: 06/27/23 10:34 AM   Specimen:  Nasopharyngeal Swab; Sputum  Result Value Ref Range Status   Specimen Description SPUTUM  Final   Special Requests NONE  Final   Sputum evaluation   Final    THIS SPECIMEN IS ACCEPTABLE FOR SPUTUM CULTURE Performed at [facility], [address], [place], [place] [address]    Report Status 06/27/2023 FINAL  Final  Culture, Respiratory w Gram Stain     Status: None   Collection Time: 06/27/23 10:34 AM   Specimen: SPU  Result Value Ref Range Status   Specimen Description   Final    SPUTUM Performed at [facility], [address] [place], [place] [address]    Special Requests   Final    NONE Reflexed from [id number] Performed at [facility], [address] [place], [place] [address]    Gram Stain   Final    MODERATE WBC PRESENT, PREDOMINANTLY PMN MODERATE GRAM NEGATIVE RODS FEW GRAM POSITIVE COCCI IN CHAINS RARE GRAM POSITIVE RODS    Culture   Final    Normal respiratory flora-no Staph aureus or Pseudomonas seen Performed at [facility], [address], [place], [place] [address]    Report Status 06/29/2023 FINAL  Final      Radiology Studies: No results found.    LOS: 10 days    Jacquelin Hawking, MD [facility] 07/05/2023, 8:32 PM   If 7PM-7AM, please contact night-coverage [email or web address]

## 2023-07-06 DIAGNOSIS — J9601 Acute respiratory failure with hypoxia: Secondary | ICD-10-CM | POA: Diagnosis not present

## 2023-07-06 DIAGNOSIS — R6521 Severe sepsis with septic shock: Secondary | ICD-10-CM | POA: Diagnosis not present

## 2023-07-06 DIAGNOSIS — A419 Sepsis, unspecified organism: Secondary | ICD-10-CM | POA: Diagnosis not present

## 2023-07-06 LAB — POTASSIUM: Potassium: 3 mmol/L — ABNORMAL LOW (ref 3.5–5.1)

## 2023-07-06 NOTE — Progress Notes (Signed)
 PROGRESS NOTE    Lauren Salazar  AVW:[id number] DOB: [birth year] DOA: 06/25/2023 PCP: [facility] [place]   Brief Narrative: Lauren Salazar is a 63 y.o. 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  Patient presented secondary to cough and shortness of breath and found to have evidence of pneumonia with sepsis physiology; imaging revealing necrotizing right lower lobe lung mass with organizing right pleural effusion.  Hospitalization complicated by severe hypoxia requiring heated high flow nasal cannula up to 50 L/min.  Patient managed on antibiotics.  Chest tube was considered but recommended against by pulmonology.  Patient improved with antibiotic therapy alone with supportive oxygen therapy.  Overall, poor prognosis per medical oncology with recommendation for transition to hospice care for which the patient disagrees at this time.  Plan for discharge to SNF.   Assessment and Plan:  Septic shock Present on admission, secondary to pneumonia and with a lactic acid as high as 5.9. Patient managed with IV fluids, without need for vasopressor support. Blood cultures obtained and were negative for infection. Patient managed via antibiotics for pneumonia with improvement.  Necrotizing right lower lobe lung mass Pneumonia Organizing right pleural effusion CT imaging significant for organized parapneumonic area, largely made of tumor. Pulmonology consulted for chest tube, which was deferred. Patient managed empirically Zosyn and completed treatment.  Acute respiratory failure with hypoxia Secondary to above. Patient requiring as much as 50 L/min via HHFNC. Weaned to 2 L.min of [place].  Lung cancer Metastasis to bone and adrenal glands Patient with history of palliative radiation, in addition to chemotherapy and immunotherapy. Treatment stopped secondary to side  effects and worsening functional status per medical oncology, patient is appropriate for hospice at this time as she is no longer a candidate for chemotherapy. Palliative care consulted. Patient, at this point, is not ready to accept hospice services. Continues to desire full scope medical care.  History of bladder cancer s/p cystectomy and ileal conduit  Diarrhea Iatrogenic. Resolved.  Pressure injury Right buttocks, coccyx. Present on admission.     DVT prophylaxis: Lovenox Code Status:   Code Status: Full Code Family Communication: None at bedside Disposition Plan: Discharge to SNF when bed is available   Consultants:  PCCM Palliative care medicine Medical oncology  Procedures:  None  Antimicrobials: Vancomycin Zosyn Ceftriaxone Azithromycin   Subjective: No concerns this morning.  Objective: BP 110/67 (BP Location: Right Arm)   Pulse (!) 115   Temp 97.7 F (36.5 C) (Oral)   Resp (!) 22   Ht 5\' 6"  (1.676 m)   Wt 50 kg   SpO2 97%   BMI 17.79 kg/m   Examination:  General exam: Appears calm and comfortable. Chronically ill appearing. Respiratory system: Clear to auscultation. Respiratory effort normal. Cardiovascular system: S1 & S2 heard, RRR. Gastrointestinal system: Abdomen is nondistended, soft and nontender. Normal bowel sounds heard. Central nervous system: Alert  Psychiatry: Judgement and insight appear normal. Mood & affect appropriate.    Data Reviewed: I have personally reviewed following labs and imaging studies  CBC Lab Results  Component Value Date   WBC 7.1 07/05/2023   RBC 3.35 (L) 07/05/2023   HGB 8.6 (L) 07/05/2023   HCT 29.6 (L) 07/05/2023   MCV 88.4 07/05/2023   MCH 25.7 (L) 07/05/2023   PLT 215 07/05/2023   MCHC 29.1 (L) 07/05/2023   RDW 21.6 (H) 07/05/2023   LYMPHSABS  0.5 (L) 06/25/2023   MONOABS 0.8 06/25/2023   EOSABS 0.0 06/25/2023   BASOSABS 0.1 06/25/2023     Last metabolic panel Lab Results  Component Value  Date   NA 139 07/05/2023   K 3.0 (L) 07/06/2023   CL 105 07/05/2023   CO2 25 07/05/2023   BUN 17 07/05/2023   CREATININE 0.77 07/05/2023   GLUCOSE 94 07/05/2023   GFRNONAA >60 07/05/2023   GFRAA >60 11/26/2019   CALCIUM 8.4 (L) 07/05/2023   PROT 5.5 (L) 07/05/2023   ALBUMIN 1.5 (L) 07/05/2023   BILITOT 0.7 07/05/2023   ALKPHOS 109 07/05/2023   AST 18 07/05/2023   ALT 12 07/05/2023   ANIONGAP 9 07/05/2023    GFR: Estimated Creatinine Clearance: 56.1 mL/min (by C-G formula based on SCr of 0.77 mg/dL).  Recent Results (from the past 240 hours)  Respiratory (~20 pathogens) panel by PCR     Status: None   Collection Time: 06/27/23 10:23 AM   Specimen: Nasopharyngeal Swab; Respiratory  Result Value Ref Range Status   Adenovirus NOT DETECTED NOT DETECTED Final   Coronavirus 229E NOT DETECTED NOT DETECTED Final    Comment: (NOTE) The Coronavirus on the Respiratory Panel, DOES NOT test for the novel  Coronavirus (2019 nCoV)    Coronavirus HKU1 NOT DETECTED NOT DETECTED Final   Coronavirus NL63 NOT DETECTED NOT DETECTED Final   Coronavirus OC43 NOT DETECTED NOT DETECTED Final   Metapneumovirus NOT DETECTED NOT DETECTED Final   Rhinovirus / Enterovirus NOT DETECTED NOT DETECTED Final   Influenza A NOT DETECTED NOT DETECTED Final   Influenza B NOT DETECTED NOT DETECTED Final   Parainfluenza Virus 1 NOT DETECTED NOT DETECTED Final   Parainfluenza Virus 2 NOT DETECTED NOT DETECTED Final   Parainfluenza Virus 3 NOT DETECTED NOT DETECTED Final   Parainfluenza Virus 4 NOT DETECTED NOT DETECTED Final   Respiratory Syncytial Virus NOT DETECTED NOT DETECTED Final   Bordetella pertussis NOT DETECTED NOT DETECTED Final   Bordetella Parapertussis NOT DETECTED NOT DETECTED Final   Chlamydophila pneumoniae NOT DETECTED NOT DETECTED Final   Mycoplasma pneumoniae NOT DETECTED NOT DETECTED Final    Comment: Performed at [facility], [address], [place], [place] [address]   Expectorated Sputum Assessment w Gram Stain, Rflx to Resp Cult     Status: None   Collection Time: 06/27/23 10:34 AM   Specimen: Nasopharyngeal Swab; Sputum  Result Value Ref Range Status   Specimen Description SPUTUM  Final   Special Requests NONE  Final   Sputum evaluation   Final    THIS SPECIMEN IS ACCEPTABLE FOR SPUTUM CULTURE Performed at [facility], [address], [place], [place] [address]    Report Status 06/27/2023 FINAL  Final  Culture, Respiratory w Gram Stain     Status: None   Collection Time: 06/27/23 10:34 AM   Specimen: SPU  Result Value Ref Range Status   Specimen Description   Final    SPUTUM Performed at [facility], [address], [place], [place] [address]    Special Requests   Final    NONE Reflexed from [id number] Performed at [facility], [address], [place], [place] [address]    Gram Stain   Final    MODERATE WBC PRESENT, PREDOMINANTLY PMN MODERATE GRAM NEGATIVE RODS FEW GRAM POSITIVE COCCI IN CHAINS RARE GRAM POSITIVE RODS    Culture   Final    Normal respiratory flora-no Staph aureus  or Pseudomonas seen Performed at [facility], [address], [place], [place] [address]    Report Status 06/29/2023 FINAL  Final      Radiology Studies: No results found.    LOS: 11 days    Jacquelin Hawking, MD [facility] 07/06/2023, 2:54 PM   If 7PM-7AM, please contact night-coverage [email or web address]

## 2023-07-06 NOTE — TOC Progression Note (Signed)
 Transition of Care [facility]) - Progression Note    Patient Details  Name: Lauren Salazar MRN: [id number] Date of Birth: 04-05-1961  [facility]) CM/SW Contact  Adoni Greenough, Olegario Messier, RN Phone Number: 07/06/2023, 2:19 PM  Clinical Narrative: Provided bed offers to patient/dtr-await choice then facility will start auth.      Expected Discharge Plan: Skilled Nursing Facility Barriers to Discharge: Continued Medical Work up  Expected Discharge Plan and Services In-house Referral: Clinical Social Work     Living arrangements for the past 2 months: Single Family Home                                       Social Determinants of Health (SDOH) Interventions SDOH Screenings   Food Insecurity: No Food Insecurity (06/26/2023)  Housing: Low Risk  (06/26/2023)  Transportation Needs: No Transportation Needs (06/26/2023)  Utilities: Not At Risk (06/26/2023)  Tobacco Use: High Risk (06/25/2023)    Readmission Risk Interventions     No data to display

## 2023-07-06 NOTE — Progress Notes (Signed)
   07/06/23 1351  Assess: MEWS Score  Temp 97.7 F (36.5 C)  BP 110/67  MAP (mmHg) 75  Pulse Rate (!) 115  Resp (!) 22  SpO2 97 %  O2 Device Nasal Cannula  O2 Flow Rate (L/min) 2 L/min  Assess: MEWS Score  MEWS Temp 0  MEWS Systolic 0  MEWS Pulse 2  MEWS RR 1  MEWS LOC 0  MEWS Score 3  MEWS Score Color Yellow  Assess: if the MEWS score is Yellow or Red  Were vital signs accurate and taken at a resting state? Yes  Does the patient meet 2 or more of the SIRS criteria? Yes  Does the patient have a confirmed or suspected source of infection? Yes  MEWS guidelines implemented  Yes, yellow  Treat  MEWS Interventions Considered administering scheduled or prn medications/treatments as ordered  Take Vital Signs  Increase Vital Sign Frequency  Yellow: Q2hr x1, continue Q4hrs until patient remains green for 12hrs  Escalate  MEWS: Escalate Yellow: Discuss with charge nurse and consider notifying provider and/or RRT  Notify: Charge Nurse/RN  Name of Charge Nurse/RN Notified Delford Field, RN  Assess: SIRS CRITERIA  SIRS Temperature  0  SIRS Respirations  1  SIRS Pulse 1  SIRS WBC 0  SIRS Score Sum  2

## 2023-07-07 DIAGNOSIS — J189 Pneumonia, unspecified organism: Secondary | ICD-10-CM | POA: Diagnosis not present

## 2023-07-07 DIAGNOSIS — R6521 Severe sepsis with septic shock: Secondary | ICD-10-CM | POA: Diagnosis not present

## 2023-07-07 DIAGNOSIS — G893 Neoplasm related pain (acute) (chronic): Secondary | ICD-10-CM | POA: Diagnosis not present

## 2023-07-07 DIAGNOSIS — J9601 Acute respiratory failure with hypoxia: Secondary | ICD-10-CM | POA: Diagnosis not present

## 2023-07-07 DIAGNOSIS — Z7189 Other specified counseling: Secondary | ICD-10-CM | POA: Diagnosis not present

## 2023-07-07 DIAGNOSIS — A419 Sepsis, unspecified organism: Secondary | ICD-10-CM | POA: Diagnosis not present

## 2023-07-07 LAB — GLUCOSE, CAPILLARY: Glucose-Capillary: 122 mg/dL — ABNORMAL HIGH (ref 70–99)

## 2023-07-07 LAB — MAGNESIUM: Magnesium: 1.9 mg/dL (ref 1.7–2.4)

## 2023-07-07 LAB — POTASSIUM: Potassium: 3.1 mmol/L — ABNORMAL LOW (ref 3.5–5.1)

## 2023-07-07 MED ORDER — POTASSIUM CHLORIDE CRYS ER 20 MEQ PO TBCR
40.0000 meq | EXTENDED_RELEASE_TABLET | ORAL | Status: AC
  Administered 2023-07-07 (×2): 40 meq via ORAL
  Filled 2023-07-07 (×2): qty 2

## 2023-07-07 NOTE — Progress Notes (Signed)
   07/07/23 1147  PT Visit Information  Last PT Received On 07/05/23  Reason Eval/Treat Not Completed Fatigue/lethargy limiting ability to participate   Pt reports not being able to sleep, declines PT at this time, elects to attempt to nap. Will follow up.   Madaline Guthrie, PT Acute Rehabilitation Services Office: [phone] 07/07/2023\

## 2023-07-07 NOTE — TOC Progression Note (Addendum)
 Transition of Care [facility]) - Progression Note    Patient Details  Name: Lauren Salazar MRN: [id number] Date of Birth: 11-12-1960  [facility]) CM/SW Contact  Dayven Linsley, Olegario Messier, RN Phone Number: 07/07/2023, 10:54 AM  Clinical Narrative:spoke to dtr [name] about choice of ST SNF-verbalized understanding-await choice prior facility getting auth.on 02 2l.    -3:30p-tr Roderic Scarce chose [facility] rep Neysa Bonito will initiate auth.    Expected Discharge Plan: Skilled Nursing Facility Barriers to Discharge: Other (must enter comment) (await ST SNF choice prior facility getting auth.)  Expected Discharge Plan and Services In-house Referral: Clinical Social Work     Living arrangements for the past 2 months: Single Family Home                                       Social Determinants of Health (SDOH) Interventions SDOH Screenings   Food Insecurity: No Food Insecurity (06/26/2023)  Housing: Low Risk  (06/26/2023)  Transportation Needs: No Transportation Needs (06/26/2023)  Utilities: Not At Risk (06/26/2023)  Tobacco Use: High Risk (06/25/2023)    Readmission Risk Interventions     No data to display

## 2023-07-07 NOTE — Progress Notes (Signed)
 Patient was coughing up secretions well during and after treatment.  Patient does her flutter herself.

## 2023-07-07 NOTE — Progress Notes (Signed)
 PROGRESS NOTE    Lauren Salazar  LKG:[id number] DOB: July 17, 1960 DOA: 06/25/2023 PCP: [facility] [place]   Brief Narrative: Lauren Salazar is a 63 y.o. 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  Patient presented secondary to cough and shortness of breath and found to have evidence of pneumonia with sepsis physiology; imaging revealing necrotizing right lower lobe lung mass with organizing right pleural effusion.  Hospitalization complicated by severe hypoxia requiring heated high flow nasal cannula up to 50 L/min.  Patient managed on antibiotics.  Chest tube was considered but recommended against by pulmonology.  Patient improved with antibiotic therapy alone with supportive oxygen therapy.  Overall, poor prognosis per medical oncology with recommendation for transition to hospice care for which the patient disagrees at this time.  Plan for discharge to SNF.   Assessment and Plan:  Septic shock Present on admission, secondary to pneumonia and with a lactic acid as high as 5.9. Patient managed with IV fluids, without need for vasopressor support. Blood cultures obtained and were negative for infection. Patient managed via antibiotics for pneumonia with improvement.  Necrotizing right lower lobe lung mass Pneumonia Organizing right pleural effusion CT imaging significant for organized parapneumonic area, largely made of tumor. Pulmonology consulted for chest tube, which was deferred. Patient managed empirically Zosyn and completed treatment.  Acute respiratory failure with hypoxia Secondary to above. Patient requiring as much as 50 L/min via HHFNC. Weaned to 2 L.min of [place].  Lung cancer Metastasis to bone and adrenal glands Patient with history of palliative radiation, in addition to chemotherapy and immunotherapy. Treatment stopped secondary to side  effects and worsening functional status per medical oncology, patient is appropriate for hospice at this time as she is no longer a candidate for chemotherapy. Palliative care consulted. Patient, at this point, is not ready to accept hospice services. Continues to desire full scope medical care.  History of bladder cancer s/p cystectomy and ileal conduit Noted.  Diarrhea Iatrogenic. Resolved.  Pressure injury Right buttocks, coccyx. Present on admission.     DVT prophylaxis: Lovenox Code Status:   Code Status: Full Code Family Communication: None at bedside Disposition Plan: Discharge to SNF when bed is available   Consultants:  PCCM Palliative care medicine Medical oncology  Procedures:  None  Antimicrobials: Vancomycin Zosyn Ceftriaxone Azithromycin   Subjective: Concerned about her weakness. No other concerns.  Objective: BP 102/71   Pulse (!) 110   Temp 97.8 F (36.6 C)   Resp 16   Ht 5\' 6"  (1.676 m)   Wt 50 kg   SpO2 96%   BMI 17.79 kg/m   Examination:  General exam: Appears calm and comfortable. Chronically ill appearing. Respiratory system: Clear to auscultation. Respiratory effort normal. Cardiovascular system: S1 & S2 heard, RRR. No murmurs, rubs, gallops or clicks. Gastrointestinal system: Abdomen is nondistended, soft and nontender. Normal bowel sounds heard. Central nervous system: Alert and oriented. No focal neurological deficits. Musculoskeletal: No edema. No calf tenderness Psychiatry: Judgement and insight appear normal. Mood & affect appropriate.    Data Reviewed: I have personally reviewed following labs and imaging studies  CBC Lab Results  Component Value Date   WBC 7.1 07/05/2023   RBC 3.35 (L) 07/05/2023   HGB 8.6 (L) 07/05/2023   HCT 29.6 (L) 07/05/2023   MCV 88.4 07/05/2023   MCH 25.7 (L) 07/05/2023   PLT 215 07/05/2023  MCHC 29.1 (L) 07/05/2023   RDW 21.6 (H) 07/05/2023   LYMPHSABS 0.5 (L) 06/25/2023   MONOABS 0.8  06/25/2023   EOSABS 0.0 06/25/2023   BASOSABS 0.1 06/25/2023     Last metabolic panel Lab Results  Component Value Date   NA 139 07/05/2023   K 3.1 (L) 07/07/2023   CL 105 07/05/2023   CO2 25 07/05/2023   BUN 17 07/05/2023   CREATININE 0.77 07/05/2023   GLUCOSE 94 07/05/2023   GFRNONAA >60 07/05/2023   GFRAA >60 11/26/2019   CALCIUM 8.4 (L) 07/05/2023   PROT 5.5 (L) 07/05/2023   ALBUMIN 1.5 (L) 07/05/2023   BILITOT 0.7 07/05/2023   ALKPHOS 109 07/05/2023   AST 18 07/05/2023   ALT 12 07/05/2023   ANIONGAP 9 07/05/2023    GFR: Estimated Creatinine Clearance: 56.1 mL/min (by C-G formula based on SCr of 0.77 mg/dL).  No results found for this or any previous visit (from the past 240 hours).     Radiology Studies: No results found.    LOS: 12 days    Jacquelin Hawking, MD [facility] 07/07/2023, 11:57 AM   If 7PM-7AM, please contact night-coverage [email or web address]

## 2023-07-07 NOTE — Progress Notes (Signed)
  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    Palliative care has been asked to get involved to support additional goals of care conversations. She has been established with the OP Palliative Cancer clinic since 07/12/2022.  Today's Discussion 07/07/2023  *Please note that this is a verbal dictation therefore any spelling or grammatical errors are due to the "[employer] One" system interpretation.  Chart reviewed inclusive of vital signs, progress notes, laboratory results, and diagnostic images.  Per MAR, patient has received 2 doses of 5 mg Norco and 3 doses of 10 mg Norco for total of 43.75 MME in addition to her fentanyl patch in the past 24 hours.  Patient assessed at the bedside.  She tells me she has just received another 10 mg dose of as needed Norco.  Her pain was initially a 7 out of 10, now a 3 out of 10.  She reports sleepiness.  Provided education on the difference between her IV fentanyl and fentanyl patch.  She understands that her IV fentanyl was discontinued but stated that the patch "only works for 15 minutes."  We discussed the option of titrating the fentanyl patch for more consistent and effective relief throughout the day, given her use of short acting Norco for breakthrough pain.  She declines at this time.  She is satisfied with her current pain regimen and prefers to minimize her use of fentanyl.  She states she has no other needs for conversation or support at this time and she would like to rest.  Questions and concerns addressed/Palliative Support Provided.   Objective Assessment: Vital Signs Vitals:   07/07/23 0802 07/07/23 0848  BP:  102/71  Pulse:  (!) 110  Resp:  16  Temp:  97.8 F (36.6 C)  SpO2: 94% 96%    Gen:  Elderly Caucasian F in NAD, slightly  drowsy HEENT: moist mucous membranes CV: Tachycardic PULM: On 2LPM HFNC ABD: soft/nontender  EXT: BUE edema R>L Neuro: Alert and mental status at baseline  SUMMARY OF RECOMMENDATIONS   -Full code/ Full scope of care  -Patient declines further adjustment of her fentanyl patch; continue 12 mcg/h patch every 72 hours and Norco as needed  - Norco 5mg /325mg  p.o. Q4H PRN 1 tab for moderate pain, 2 tabs for severe pain -Goal remains to go to SNF for additional rehab.  Patient is not ready for hospice after extensive conversations with palliative care and oncology.  Will benefit from continued GOC discussion pending clinical course -Continue outpatient palliative care upon discharge -Psychosocial emotional support provided -PMT remains available as needed  Billing based on MDM: high ______________________________________________________________________________________ Richardson Dopp, PA-C [place] Palliative Medicine Team Team Cell Phone: [phone] Please utilize secure chat with additional questions, if there is no response within 30 minutes please call the above phone number  Palliative Medicine Team providers are available by phone from 7am to 7pm daily and can be reached through the team cell phone.  Should this patient require assistance outside of these hours, please call the patient's attending physician.

## 2023-07-08 DIAGNOSIS — J9601 Acute respiratory failure with hypoxia: Secondary | ICD-10-CM | POA: Diagnosis not present

## 2023-07-08 DIAGNOSIS — R6521 Severe sepsis with septic shock: Secondary | ICD-10-CM | POA: Diagnosis not present

## 2023-07-08 DIAGNOSIS — A419 Sepsis, unspecified organism: Secondary | ICD-10-CM | POA: Diagnosis not present

## 2023-07-08 LAB — POTASSIUM: Potassium: 3.9 mmol/L (ref 3.5–5.1)

## 2023-07-08 NOTE — Progress Notes (Addendum)
 PROGRESS NOTE    Lauren Salazar  ZOX:[id number] DOB: 1961/02/14 DOA: 06/25/2023 PCP: [facility] [place]   Brief Narrative: Lauren Salazar is a 63 y.o. 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  Patient presented secondary to cough and shortness of breath and found to have evidence of pneumonia with sepsis physiology; imaging revealing necrotizing right lower lobe lung mass with organizing right pleural effusion.  Hospitalization complicated by severe hypoxia requiring heated high flow nasal cannula up to 50 L/min.  Patient managed on antibiotics.  Chest tube was considered but recommended against by pulmonology.  Patient improved with antibiotic therapy alone with supportive oxygen therapy.  Overall, poor prognosis per medical oncology with recommendation for transition to hospice care for which the patient disagrees at this time.  Plan for discharge to SNF.   Assessment and Plan:  Septic shock Present on admission, secondary to pneumonia and with a lactic acid as high as 5.9. Patient managed with IV fluids, without need for vasopressor support. Blood cultures obtained and were negative for infection. Patient managed via antibiotics for pneumonia with improvement.  Necrotizing right lower lobe lung mass Pneumonia Organizing right pleural effusion CT imaging significant for organized parapneumonic area, largely made of tumor. Pulmonology consulted for chest tube, which was deferred. Patient managed empirically Zosyn and completed treatment.  Acute respiratory failure with hypoxia Secondary to above. Patient requiring as much as 50 L/min via HHFNC. Weaned to 1 L.min of oxygen via [place].  Lung cancer Metastasis to bone and adrenal glands Patient with history of palliative radiation, in addition to chemotherapy and immunotherapy. Treatment stopped  secondary to side effects and worsening functional status per medical oncology, patient is appropriate for hospice at this time as she is no longer a candidate for chemotherapy. Palliative care consulted. Patient, at this point, is not ready to accept hospice services. Continues to desire full scope medical care.  History of bladder cancer s/p cystectomy and ileal conduit Noted.  Hypokalemia -Replete as needed -Recheck potassium  Hypomagnesemia Improved with supplementation  Diarrhea Iatrogenic. Resolved.  Pressure injury Right buttocks, coccyx. Present on admission.     DVT prophylaxis: Lovenox Code Status:   Code Status: Full Code Family Communication: None at bedside Disposition Plan: Discharge to SNF when bed is available   Consultants:  PCCM Palliative care medicine Medical oncology  Procedures:  None  Antimicrobials: Vancomycin Zosyn Ceftriaxone Azithromycin   Subjective: No concerns this morning. Stable.  Objective: BP 102/75 (BP Location: Right Arm)   Pulse 97   Temp 97.9 F (36.6 C) (Oral)   Resp 17   Ht 5\' 6"  (1.676 m)   Wt 50 kg   SpO2 94%   BMI 17.79 kg/m   Examination:  General exam: Appears calm and comfortable Respiratory system: Diminished. Respiratory effort normal. Cardiovascular system: S1 & S2 heard, RRR. Gastrointestinal system: Abdomen is nondistended, soft and nontender. Normal bowel sounds heard. Central nervous system: Alert and oriented. No focal neurological deficits. Psychiatry: Judgement and insight appear normal. Mood & affect appropriate.     Data Reviewed: I have personally reviewed following labs and imaging studies  CBC Lab Results  Component Value Date   WBC 7.1 07/05/2023   RBC 3.35 (L) 07/05/2023   HGB 8.6 (L) 07/05/2023   HCT 29.6 (L) 07/05/2023   MCV 88.4 07/05/2023   MCH 25.7 (L) 07/05/2023   PLT 215 07/05/2023  MCHC 29.1 (L) 07/05/2023   RDW 21.6 (H) 07/05/2023   LYMPHSABS 0.5 (L) 06/25/2023    MONOABS 0.8 06/25/2023   EOSABS 0.0 06/25/2023   BASOSABS 0.1 06/25/2023     Last metabolic panel Lab Results  Component Value Date   NA 139 07/05/2023   K 3.1 (L) 07/07/2023   CL 105 07/05/2023   CO2 25 07/05/2023   BUN 17 07/05/2023   CREATININE 0.77 07/05/2023   GLUCOSE 94 07/05/2023   GFRNONAA >60 07/05/2023   GFRAA >60 11/26/2019   CALCIUM 8.4 (L) 07/05/2023   PROT 5.5 (L) 07/05/2023   ALBUMIN 1.5 (L) 07/05/2023   BILITOT 0.7 07/05/2023   ALKPHOS 109 07/05/2023   AST 18 07/05/2023   ALT 12 07/05/2023   ANIONGAP 9 07/05/2023    GFR: Estimated Creatinine Clearance: 56.1 mL/min (by C-G formula based on SCr of 0.77 mg/dL).  No results found for this or any previous visit (from the past 240 hours).     Radiology Studies: No results found.    LOS: 13 days    Jacquelin Hawking, MD [facility] 07/08/2023, 11:57 AM   If 7PM-7AM, please contact night-coverage [email or web address]

## 2023-07-08 NOTE — Progress Notes (Signed)
 Physical Therapy Treatment Patient Details Name: Lauren Salazar MRN: [id number] DOB: June 20, 1960 Today's Date: 07/08/2023   History of Present Illness Pt is a 63 year old woman admitted on 06/25/23 with shortness of breath. + septic shock due to PNA, R L lower lobe mass with necrosis, Pt with new sacral and R buttocks pressure wounds. 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    PT Comments  Pt agreeable to working with therapy. Worked on sit to stands (activity tolerance, strengthening) with use of STEDY. Also had pt perform LE exercises. Pt tolerated activity well. O2: 96% on 1L (pt asked for O2 to be bumped up to 2L during activity). Patient will benefit from continued inpatient follow up therapy, <3 hours/day     If plan is discharge home, recommend the following: Two people to help with walking and/or transfers;Two people to help with bathing/dressing/bathroom;Assist for transportation;Help with stairs or ramp for entrance   Can travel by private vehicle     No  Equipment Recommendations  None recommended by PT    Recommendations for Other Services       Precautions / Restrictions Precautions Precautions: Fall Precaution/Restrictions Comments: watch HR and O2, painful sacral sore Restrictions Weight Bearing Restrictions Per Provider Order: No     Mobility  Bed Mobility               General bed mobility comments: oob in recliner    Transfers Overall transfer level: Needs assistance   Transfers: Sit to/from Stand Sit to Stand: Via lift equipment (STEDY)           General transfer comment: Practiced sit to stands for strengthening, activity tolerance. Sit to stand x 2 with STEDY-assist to power up, control descent. Stood for ~1 minute each time. Also worked on standing hip flexion x 3 reps each leg. Cues for safety, technique. Transfer via Lift Equipment:  Stedy  Ambulation/Gait               General Gait Details: Nt on today-still  weak   Stairs             Wheelchair Mobility     Tilt Bed    Modified Rankin (Stroke Patients Only)       Balance Overall balance assessment: Needs assistance         Standing balance support: Bilateral upper extremity supported, During functional activity Standing balance-Leahy Scale: Poor                              Communication Communication Communication: No apparent difficulties  Cognition Arousal: Alert Behavior During Therapy: WFL for tasks assessed/performed   PT - Cognitive impairments: No apparent impairments                         Following commands: Intact      Cueing Cueing Techniques: Verbal cues  Exercises Total Joint Exercises Long Arc Quad: AROM, Both, 15 reps, Seated    General Comments        Pertinent Vitals/Pain Pain Assessment Pain Assessment: Faces Faces Pain Scale: Hurts even more Pain Location: bil thighs, sacrum/buttocks Pain Descriptors / Indicators: Discomfort, Aching Pain Intervention(s): Limited activity within patient's tolerance, Monitored during session, Repositioned    Home Living  Prior Function            PT Goals (current goals can now be found in the care plan section) Progress towards PT goals: Progressing toward goals    Frequency    Min 2X/week      PT Plan      Co-evaluation              AM-PAC PT "6 Clicks" Mobility   Outcome Measure  Help needed turning from your back to your side while in a flat bed without using bedrails?: A Little Help needed moving from lying on your back to sitting on the side of a flat bed without using bedrails?: A Lot Help needed moving to and from a bed to a chair (including a wheelchair)?: A Lot Help needed standing up from a chair using your arms (e.g., wheelchair or bedside chair)?: A Lot Help needed to  walk in hospital room?: Total Help needed climbing 3-5 steps with a railing? : Total 6 Click Score: 11    End of Session Equipment Utilized During Treatment: Oxygen Activity Tolerance: Patient limited by fatigue;Patient limited by pain Patient left: in chair;with call bell/phone within reach;with chair alarm set   PT Visit Diagnosis: Unsteadiness on feet (R26.81);Muscle weakness (generalized) (M62.81);History of falling (Z91.81);Pain Pain - part of body:  (bil thighs, sacrum, back)     Time: 6213-0865 PT Time Calculation (min) (ACUTE ONLY): 29 min  Charges:    $Therapeutic Exercise: 8-22 mins $Therapeutic Activity: 8-22 mins PT General Charges $$ ACUTE PT VISIT: 1 Visit                         Faye Ramsay, PT Acute Rehabilitation  Office: [phone]

## 2023-07-09 DIAGNOSIS — J9601 Acute respiratory failure with hypoxia: Secondary | ICD-10-CM | POA: Diagnosis not present

## 2023-07-09 DIAGNOSIS — R6521 Severe sepsis with septic shock: Secondary | ICD-10-CM | POA: Diagnosis not present

## 2023-07-09 DIAGNOSIS — A419 Sepsis, unspecified organism: Secondary | ICD-10-CM | POA: Diagnosis not present

## 2023-07-09 NOTE — Plan of Care (Signed)
  Problem: Pain Managment: Goal: General experience of comfort will improve and/or be controlled Outcome: Progressing   Problem: Clinical Measurements: Goal: Will remain free from infection Outcome: Adequate for Discharge

## 2023-07-09 NOTE — Plan of Care (Signed)
   Problem: Clinical Measurements: Goal: Will remain free from infection Outcome: Adequate for Discharge

## 2023-07-09 NOTE — Progress Notes (Signed)
 PROGRESS NOTE    Lauren Salazar  NWG:[id number] DOB: 1960-10-30 DOA: 06/25/2023 PCP: [facility] [place]   Brief Narrative: Lauren Salazar is a 63 y.o. 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  Patient presented secondary to cough and shortness of breath and found to have evidence of pneumonia with sepsis physiology; imaging revealing necrotizing right lower lobe lung mass with organizing right pleural effusion.  Hospitalization complicated by severe hypoxia requiring heated high flow nasal cannula up to 50 L/min.  Patient managed on antibiotics.  Chest tube was considered but recommended against by pulmonology.  Patient improved with antibiotic therapy alone with supportive oxygen therapy.  Overall, poor prognosis per medical oncology with recommendation for transition to hospice care for which the patient disagrees at this time.  Plan for discharge to SNF.   Assessment and Plan:  Septic shock Present on admission, secondary to pneumonia and with a lactic acid as high as 5.9. Patient managed with IV fluids, without need for vasopressor support. Blood cultures obtained and were negative for infection. Patient managed via antibiotics for pneumonia with improvement.  Necrotizing right lower lobe lung mass Pneumonia Organizing right pleural effusion CT imaging significant for organized parapneumonic area, largely made of tumor. Pulmonology consulted for chest tube, which was deferred. Patient managed empirically Zosyn and completed treatment.  Acute respiratory failure with hypoxia Secondary to above. Patient requiring as much as 50 L/min via HHFNC. Weaned to 1 L.min of oxygen via [place].  Lung cancer Metastasis to bone and adrenal glands Patient with history of palliative radiation, in addition to chemotherapy and immunotherapy. Treatment stopped  secondary to side effects and worsening functional status per medical oncology, patient is appropriate for hospice at this time as she is no longer a candidate for chemotherapy. Palliative care consulted. Patient, at this point, is not ready to accept hospice services. Continues to desire full scope medical care.  History of bladder cancer s/p cystectomy and ileal conduit Noted.  Hypokalemia -Replete as needed -Recheck potassium  Hypomagnesemia Improved with supplementation  Diarrhea Iatrogenic. Resolved.  Pressure injury Right buttocks, coccyx. Present on admission.     DVT prophylaxis: Lovenox Code Status:   Code Status: Full Code Family Communication: None at bedside Disposition Plan: Discharge to SNF when bed is available   Consultants:  PCCM Palliative care medicine Medical oncology  Procedures:  None  Antimicrobials: Vancomycin Zosyn Ceftriaxone Azithromycin   Subjective: Asking about having rehab in the hospital. Congestion today. She states using the flutter valve helps.  Objective: BP 130/80 (BP Location: Left Arm)   Pulse (!) 101   Temp 98 F (36.7 C)   Resp 16   Ht 5\' 6"  (1.676 m)   Wt 50 kg   SpO2 98%   BMI 17.79 kg/m   Examination:  General exam: Appears calm and comfortable Chronically ill appearing. Respiratory system: Rhonchi on right side with diminished breath sounds. Respiratory effort normal. Cardiovascular system: S1 & S2 heard, RRR. Gastrointestinal system: Abdomen is nondistended, soft and nontender. Normal bowel sounds heard. Central nervous system: Alert and oriented. No focal neurological deficits.   Data Reviewed: I have personally reviewed following labs and imaging studies  CBC Lab Results  Component Value Date   WBC 7.1 07/05/2023   RBC 3.35 (L) 07/05/2023   HGB 8.6 (L) 07/05/2023   HCT 29.6 (L) 07/05/2023   MCV 88.4 07/05/2023  MCH 25.7 (L) 07/05/2023   PLT 215 07/05/2023   MCHC 29.1 (L) 07/05/2023   RDW  21.6 (H) 07/05/2023   LYMPHSABS 0.5 (L) 06/25/2023   MONOABS 0.8 06/25/2023   EOSABS 0.0 06/25/2023   BASOSABS 0.1 06/25/2023     Last metabolic panel Lab Results  Component Value Date   NA 139 07/05/2023   K 3.9 07/08/2023   CL 105 07/05/2023   CO2 25 07/05/2023   BUN 17 07/05/2023   CREATININE 0.77 07/05/2023   GLUCOSE 94 07/05/2023   GFRNONAA >60 07/05/2023   GFRAA >60 11/26/2019   CALCIUM 8.4 (L) 07/05/2023   PROT 5.5 (L) 07/05/2023   ALBUMIN 1.5 (L) 07/05/2023   BILITOT 0.7 07/05/2023   ALKPHOS 109 07/05/2023   AST 18 07/05/2023   ALT 12 07/05/2023   ANIONGAP 9 07/05/2023    GFR: Estimated Creatinine Clearance: 56.1 mL/min (by C-G formula based on SCr of 0.77 mg/dL).  No results found for this or any previous visit (from the past 240 hours).     Radiology Studies: No results found.    LOS: 14 days    Jacquelin Hawking, MD [facility] 07/09/2023, 9:08 AM   If 7PM-7AM, please contact night-coverage [email or web address]

## 2023-07-10 DIAGNOSIS — A419 Sepsis, unspecified organism: Secondary | ICD-10-CM | POA: Diagnosis not present

## 2023-07-10 DIAGNOSIS — R6521 Severe sepsis with septic shock: Secondary | ICD-10-CM | POA: Diagnosis not present

## 2023-07-10 MED ORDER — FERROUS SULFATE 325 (65 FE) MG PO TABS: 325.0000 mg | ORAL_TABLET | ORAL | Status: DC

## 2023-07-10 MED ORDER — FENTANYL 12 MCG/HR TD PT72: 1.0000 | MEDICATED_PATCH | TRANSDERMAL | 0 refills | Status: DC

## 2023-07-10 MED ORDER — GUAIFENESIN 200 MG PO TABS: 200.0000 mg | ORAL_TABLET | ORAL | Status: DC | PRN

## 2023-07-10 MED ORDER — MELATONIN 3 MG PO TABS: 3.0000 mg | ORAL_TABLET | Freq: Every day | ORAL | Status: DC

## 2023-07-10 MED ORDER — ENSURE ENLIVE PO LIQD: 237.0000 mL | Freq: Two times a day (BID) | ORAL | Status: DC

## 2023-07-10 MED ORDER — HEPARIN SOD (PORK) LOCK FLUSH 100 UNIT/ML IV SOLN
500.0000 [IU] | INTRAVENOUS | Status: AC | PRN
  Administered 2023-07-10: 500 [IU]

## 2023-07-10 MED ORDER — HYDROCODONE-ACETAMINOPHEN 5-325 MG PO TABS: 1.0000 | ORAL_TABLET | Freq: Four times a day (QID) | ORAL | 0 refills | Status: DC | PRN

## 2023-07-10 NOTE — Progress Notes (Signed)
 PROGRESS NOTE    Lauren Salazar  WUJ:[id number] DOB: 04-17-1961 DOA: 06/25/2023 PCP: [facility] [place]   Brief Narrative: Lauren Salazar is a 63 y.o. 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  Patient presented secondary to cough and shortness of breath and found to have evidence of pneumonia with sepsis physiology; imaging revealing necrotizing right lower lobe lung mass with organizing right pleural effusion.  Hospitalization complicated by severe hypoxia requiring heated high flow nasal cannula up to 50 L/min.  Patient managed on antibiotics.  Chest tube was considered but recommended against by pulmonology.  Patient improved with antibiotic therapy alone with supportive oxygen therapy.  Overall, poor prognosis per medical oncology with recommendation for transition to hospice care for which the patient disagrees at this time.  Plan for discharge to SNF.   Assessment and Plan:  Septic shock Present on admission, secondary to pneumonia and with a lactic acid as high as 5.9. Patient managed with IV fluids, without need for vasopressor support. Blood cultures obtained and were negative for infection. Patient managed via antibiotics for pneumonia with improvement.  Necrotizing right lower lobe lung mass Pneumonia Organizing right pleural effusion CT imaging significant for organized parapneumonic area, largely made of tumor. Pulmonology consulted for chest tube, which was deferred. Patient managed empirically Zosyn and completed treatment.  Acute respiratory failure with hypoxia Secondary to above. Patient requiring as much as 50 L/min via HHFNC. Weaned to 1 L.min of oxygen via [place].  Lung cancer Metastasis to bone and adrenal glands Patient with history of palliative radiation, in addition to chemotherapy and immunotherapy. Treatment stopped  secondary to side effects and worsening functional status per medical oncology, patient is appropriate for hospice at this time as she is no longer a candidate for chemotherapy. Palliative care consulted. Patient, at this point, is not ready to accept hospice services. Continues to desire full scope medical care.  History of bladder cancer s/p cystectomy and ileal conduit Noted.  Hypokalemia Improved with repletion.  Hypomagnesemia Improved with supplementation  Diarrhea Iatrogenic. Resolved.  Pressure injury Right buttocks, coccyx. Present on admission.     DVT prophylaxis: Lovenox Code Status:   Code Status: Full Code Family Communication: None at bedside Disposition Plan: Discharge to SNF when bed is available   Consultants:  PCCM Palliative care medicine Medical oncology  Procedures:  None  Antimicrobials: Vancomycin Zosyn Ceftriaxone Azithromycin   Subjective: No concerns this morning. Breathing is stable.  Objective: BP 109/77 (BP Location: Right Arm)   Pulse (!) 103   Temp 98.2 F (36.8 C)   Resp 15   Ht 5\' 6"  (1.676 m)   Wt 50 kg   SpO2 98%   BMI 17.79 kg/m   Examination:  General exam: Appears calm and comfortable. Chronically ill appearing. Respiratory system: Diminished. Respiratory effort normal. Cardiovascular system: S1 & S2 heard, RRR. Gastrointestinal system: Abdomen is nondistended, soft and nontender. Normal bowel sounds heard. Central nervous system: Alert and oriented. No focal neurological deficits.   Data Reviewed: I have personally reviewed following labs and imaging studies  CBC Lab Results  Component Value Date   WBC 7.1 07/05/2023   RBC 3.35 (L) 07/05/2023   HGB 8.6 (L) 07/05/2023   HCT 29.6 (L) 07/05/2023   MCV 88.4 07/05/2023   MCH 25.7 (L) 07/05/2023   PLT 215 07/05/2023   MCHC 29.1 (L) 07/05/2023   RDW  21.6 (H) 07/05/2023   LYMPHSABS 0.5 (L) 06/25/2023   MONOABS 0.8 06/25/2023   EOSABS 0.0 06/25/2023    BASOSABS 0.1 06/25/2023     Last metabolic panel Lab Results  Component Value Date   NA 139 07/05/2023   K 3.9 07/08/2023   CL 105 07/05/2023   CO2 25 07/05/2023   BUN 17 07/05/2023   CREATININE 0.77 07/05/2023   GLUCOSE 94 07/05/2023   GFRNONAA >60 07/05/2023   GFRAA >60 11/26/2019   CALCIUM 8.4 (L) 07/05/2023   PROT 5.5 (L) 07/05/2023   ALBUMIN 1.5 (L) 07/05/2023   BILITOT 0.7 07/05/2023   ALKPHOS 109 07/05/2023   AST 18 07/05/2023   ALT 12 07/05/2023   ANIONGAP 9 07/05/2023    GFR: Estimated Creatinine Clearance: 56.1 mL/min (by C-G formula based on SCr of 0.77 mg/dL).  No results found for this or any previous visit (from the past 240 hours).     Radiology Studies: No results found.    LOS: 15 days    Jacquelin Hawking, MD [facility] 07/10/2023, 9:53 AM   If 7PM-7AM, please contact night-coverage [email or web address]

## 2023-07-10 NOTE — Progress Notes (Signed)
 PTAR at bedside, pt ready for d/c

## 2023-07-10 NOTE — Discharge Summary (Signed)
 Physician Discharge Summary   Patient: Lauren Salazar MRN: [id number] DOB: [birth year]  Admit date:     06/25/2023  Discharge date: 07/10/23  Discharge Physician: Jacquelin Hawking, MD   PCP: [facility] [place]   Recommendations at discharge:  PCP visit for hospital follow-up Oncology follow-up Outpatient palliative care referral Ongoing goals of care discussions  Discharge Diagnoses: Principal Problem:   Septic shock due to pneumonia Active Problems:   Lung cancer (HCC)   Pressure injury of skin   Iron deficiency anemia   Acute hypoxemic respiratory failure (HCC)   Palliative care by specialist  Resolved Problems:   * No resolved hospital problems. *  Hospital Course: Lauren Salazar is a 63 y.o. 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  Patient presented secondary to cough and shortness of breath and found to have evidence of pneumonia with sepsis physiology; imaging revealing necrotizing right lower lobe lung mass with organizing right pleural effusion.  Hospitalization complicated by severe hypoxia requiring heated high flow nasal cannula up to 50 L/min.  Patient managed on antibiotics.  Chest tube was considered but recommended against by pulmonology.  Patient improved with antibiotic therapy alone with supportive oxygen therapy.  Overall, poor prognosis per medical oncology with recommendation for transition to hospice care for which the patient disagrees at this time.  Discharge to SNF.  Assessment and Plan:  Septic shock Present on admission, secondary to pneumonia and with a lactic acid as high as 5.9. Patient managed with IV fluids, without need for vasopressor support. Blood cultures obtained and were negative for infection. Patient managed via antibiotics for pneumonia with improvement.   Necrotizing right lower lobe lung  mass Pneumonia Organizing right pleural effusion CT imaging significant for organized parapneumonic area, largely made of tumor. Pulmonology consulted for chest tube, which was deferred. Patient managed empirically Zosyn and completed treatment.   Acute respiratory failure with hypoxia Secondary to above. Patient requiring as much as 50 L/min via [facility]. Weaned to 1 L.min of oxygen via [place].   Lung cancer Metastasis to bone and adrenal glands Patient with history of palliative radiation, in addition to chemotherapy and immunotherapy. Treatment stopped secondary to side effects and worsening functional status per medical oncology, patient is appropriate for hospice at this time as she is no longer a candidate for chemotherapy. Palliative care consulted. Patient, at this point, is not ready to accept hospice services. Continues to desire full scope medical care. Follow-up with oncology. Recommend outpatient palliative care consult and continued goals of care discussions.   History of bladder cancer s/p cystectomy and ileal conduit Noted.   Hypokalemia Improved with repletion.   Hypomagnesemia Improved with supplementation   Diarrhea Iatrogenic. Resolved.   Pressure injury Right buttocks, coccyx. Present on admission.   Consultants:  PCCM Palliative care medicine Medical oncology   Procedures:  None  Disposition: Skilled nursing facility Diet recommendation: Regular diet   DISCHARGE MEDICATION: Allergies as of 07/10/2023       Reactions   Morphine Sulfate Other (See Comments)   hallucination   Celecoxib Palpitations, Other (See Comments)   Chest pain        Medication List     STOP taking these medications    azithromycin 250 MG tablet Commonly known as: Zithromax Z-Pak   dexamethasone 4 MG tablet Commonly known as: DECADRON   meloxicam 15 MG tablet Commonly known as: MOBIC  morphine 15 MG 12 hr tablet Commonly known as: MS CONTIN       TAKE these  medications    albuterol 108 (90 Base) MCG/ACT inhaler Commonly known as: VENTOLIN HFA Inhale 1-2 puffs into the lungs every 4 (four) hours as needed for wheezing or shortness of breath.   benzonatate 100 MG capsule Commonly known as: TESSALON Take 2 capsules (200 mg total) by mouth 3 (three) times daily as needed for cough.   Breztri Aerosphere 160-9-4.8 MCG/ACT Aero Generic drug: budeson-glycopyrrolate-formoterol Inhale 2 puffs into the lungs daily.   feeding supplement Liqd Take 237 mLs by mouth 2 (two) times daily between meals.   fentaNYL 12 MCG/HR Commonly known as: DURAGESIC Place 1 patch onto the skin every 3 (three) days. Start taking on: July 12, 2023   ferrous sulfate 325 (65 FE) MG tablet Take 1 tablet (325 mg total) by mouth every other day. Start taking on: July 12, 2023   folic acid 1 MG tablet Commonly known as: FOLVITE TAKE 1 TABLET(1 MG) BY MOUTH DAILY What changed:  how much to take how to take this when to take this   guaiFENesin 200 MG tablet Take 1 tablet (200 mg total) by mouth every 4 (four) hours as needed for to loosen phlegm.   HYDROcodone-acetaminophen 5-325 MG tablet Commonly known as: NORCO/VICODIN Take 1 tablet by mouth every 6 (six) hours as needed for moderate pain (pain score 4-6) or severe pain (pain score 7-10) (breakthrough pain in between use of morphine extended release).   lidocaine 5 % Commonly known as: LIDODERM Place 1 patch onto the skin daily. Remove & Discard patch within 12 hours or as directed by MD   magic mouthwash (nystatin, diphenhydrAMINE, alum & mag hydroxide) suspension mixture Swish and swallow 10 mLs 3 (three) times daily.   melatonin 3 MG Tabs tablet Take 1 tablet (3 mg total) by mouth at bedtime.   nicotine 21 mg/24hr patch Commonly known as: NICODERM CQ - dosed in mg/24 hours Place 1 patch (21 mg total) onto the skin daily.   prochlorperazine 10 MG tablet Commonly known as: COMPAZINE Take 1 tablet  (10 mg total) by mouth every 6 (six) hours as needed for nausea or vomiting.   senna-docusate 8.6-50 MG tablet Commonly known as: Senna S Take 2 tablets by mouth at bedtime.   TYLENOL 500 MG tablet Generic drug: acetaminophen Take 500 mg by mouth every 6 (six) hours as needed for mild pain (pain score 1-3) or moderate pain (pain score 4-6).        Contact information for after-discharge care     Destination     [facility] .   Service: Skilled Nursing Contact information: [address] [place] [address] [phone]                    Discharge Exam: BP 109/77 (BP Location: Right Arm)   Pulse (!) 103   Temp 98.2 F (36.8 C)   Resp 15   Ht 5\' 6"  (1.676 m)   Wt 50 kg   SpO2 98%   BMI 17.79 kg/m   General exam: Appears calm and comfortable. Chronically ill appearing. Respiratory system: Diminished. Respiratory effort normal. Cardiovascular system: S1 & S2 heard, RRR. Gastrointestinal system: Abdomen is nondistended, soft and nontender. Normal bowel sounds heard. Central nervous system: Alert and oriented. No focal neurological deficits.  Condition at discharge: poor  The results of significant diagnostics from this hospitalization (  including imaging, microbiology, ancillary and laboratory) are listed below for reference.   Imaging Studies: CT CHEST W CONTRAST Result Date: 06/27/2023 CLINICAL DATA:  Pneumonia, complications suspected. History of metastatic lung cancer. History of bladder cancer. * Tracking Code: BO * EXAM: CT CHEST WITH CONTRAST TECHNIQUE: Multidetector CT imaging of the chest was performed during intravenous contrast administration. RADIATION DOSE REDUCTION: This exam was performed according to the departmental dose-optimization program which includes automated exposure control, adjustment of the mA and/or kV according to patient size and/or use of iterative reconstruction technique. CONTRAST:   75mL OMNIPAQUE IOHEXOL 300 MG/ML  SOLN COMPARISON:  CT of the chest, abdomen and pelvis 05/25/2023 and 02/06/2023. FINDINGS: Cardiovascular: No acute vascular findings are identified. Right IJ Port-A-Cath extends to the superior cavoatrial junction. There is atherosclerosis of the aorta, great vessels and coronary arteries. The heart size is normal. There is no pericardial effusion. Mediastinum/Nodes: Enlarging mediastinal lymph nodes suspicious for progressive metastatic disease. Subcarinal nodal mass measures approximately 2.9 x 2.0 cm on image 69/2 (previously 2.9 x 1.9 cm). 1.0 cm prevascular node on image 44/2 and 1.3 cm AP window node on image 51/2 has mildly progressed. Probable ill-defined right hilar adenopathy, difficult to differentiate from adjacent consolidated lung.Progressive left axillary and subpectoral adenopathy measuring up to 1.0 cm short axis on image 43/2. The thyroid gland, trachea and esophagus demonstrate no significant findings. Lungs/Pleura: Previously demonstrated heterogeneous enhancing mass in the right lower lobe is difficult to differentiate from adjacent lung and pleural fluid, although measures approximately 6.8 x 4.7 cm on image 83/2. There is increased necrosis within this mass. The mass communicates with an increasingly organized right pleural fluid collection posteriorly, measuring up to 8.2 x 4.6 cm on image 116/2. There is increased air within this collection and increased peripheral enhancement. Increased consolidation and cavitation in the right middle lobe. New moderate-sized dependent left pleural effusion with new patchy airspace disease dependently in the left upper and lower lobes, most consistent with pneumonia. Underlying moderate to severe centrilobular and paraseptal emphysema. No pneumothorax. Upper abdomen: Large heterogeneous bilateral adrenal metastases are again noted, measuring up to 6.5 cm on the right and 6.5 cm on the left, mildly enlarged from previous  CT. No definite metastatic disease identified within the visualized liver. Possible tumor extension into the IVC from the right adrenal gland. Musculoskeletal/Chest wall: Stable T8 compression deformity. There is a mildly progressive metastasis involving the posterior aspect of the left 11th rib. IMPRESSION: 1. Increased necrosis within the right lower lobe mass with increased organization of the adjacent right pleural fluid collection. There is increased air within this collection and increased peripheral enhancement, suspicious for empyema. 2. Increased consolidation and cavitation in the right middle lobe. New moderate-sized dependent left pleural effusion with new patchy airspace disease dependently in the left upper and lower lobes, most consistent with pneumonia. 3. Progressive metastatic disease with enlarging mediastinal and left axillary/subpectoral lymph nodes, enlarging necrotic right lower lobe mass and enlarging bilateral adrenal metastases. Possible tumor extension into the IVC from the right adrenal gland. Mildly progressive metastasis involving the posterior aspect of the left 11th rib. 4. Aortic Atherosclerosis (ICD10-I70.0) and Emphysema (ICD10-J43.9). Electronically Signed   By: Carey Bullocks M.D.   On: 06/27/2023 11:38   DG Chest Portable 1 View Result Date: 06/25/2023 CLINICAL DATA:  Shortness of breath EXAM: PORTABLE CHEST 1 VIEW COMPARISON:  09/17/2019 FINDINGS: Porta catheter on the right with tip at the lower SVC. Bilateral consolidation superimposed on chronic opacity at the right  base where there is a chronic mass and pleural fluid by most recent CT. No pneumothorax. Normal heart size. IMPRESSION: Bilateral pneumonia with underlying malignancy in the right lower chest. Electronically Signed   By: Tiburcio Pea M.D.   On: 06/25/2023 10:16    Microbiology: Results for orders placed or performed during the hospital encounter of 06/25/23  Resp panel by RT-PCR (RSV, Flu A&B, Covid)  Anterior Nasal Swab     Status: None   Collection Time: 06/25/23 10:24 AM   Specimen: Anterior Nasal Swab  Result Value Ref Range Status   SARS Coronavirus 2 by RT PCR NEGATIVE NEGATIVE Final    Comment: (NOTE) SARS-CoV-2 target nucleic acids are NOT DETECTED.  The SARS-CoV-2 RNA is generally detectable in upper respiratory specimens during the acute phase of infection. The lowest concentration of SARS-CoV-2 viral copies this assay can detect is 138 copies/mL. A negative result does not preclude SARS-Cov-2 infection and should not be used as the sole basis for treatment or other patient management decisions. A negative result may occur with  improper specimen collection/handling, submission of specimen other than nasopharyngeal swab, presence of viral mutation(s) within the areas targeted by this assay, and inadequate number of viral copies(<138 copies/mL). A negative result must be combined with clinical observations, patient history, and epidemiological information. The expected result is Negative.  Fact Sheet for Patients:  BloggerCourse.com  Fact Sheet for Healthcare Providers:  SeriousBroker.it  This test is no t yet approved or cleared by the [place] FDA and  has been authorized for detection and/or diagnosis of SARS-CoV-2 by FDA under an Emergency Use Authorization (EUA). This EUA will remain  in effect (meaning this test can be used) for the duration of the COVID-19 declaration under Section 564(b)(1) of the Act, 21 U.S.C.section 360bbb-3(b)(1), unless the authorization is terminated  or revoked sooner.       Influenza A by PCR NEGATIVE NEGATIVE Final   Influenza B by PCR NEGATIVE NEGATIVE Final    Comment: (NOTE) The Xpert Xpress SARS-CoV-2/FLU/RSV plus assay is intended as an aid in the diagnosis of influenza from Nasopharyngeal swab specimens and should not be used as a sole basis for treatment. Nasal washings  and aspirates are unacceptable for Xpert Xpress SARS-CoV-2/FLU/RSV testing.  Fact Sheet for Patients: BloggerCourse.com  Fact Sheet for Healthcare Providers: SeriousBroker.it  This test is not yet approved or cleared by the [place] FDA and has been authorized for detection and/or diagnosis of SARS-CoV-2 by FDA under an Emergency Use Authorization (EUA). This EUA will remain in effect (meaning this test can be used) for the duration of the COVID-19 declaration under Section 564(b)(1) of the Act, 21 U.S.C. section 360bbb-3(b)(1), unless the authorization is terminated or revoked.     Resp Syncytial Virus by PCR NEGATIVE NEGATIVE Final    Comment: (NOTE) Fact Sheet for Patients: BloggerCourse.com  Fact Sheet for Healthcare Providers: SeriousBroker.it  This test is not yet approved or cleared by the [place] FDA and has been authorized for detection and/or diagnosis of SARS-CoV-2 by FDA under an Emergency Use Authorization (EUA). This EUA will remain in effect (meaning this test can be used) for the duration of the COVID-19 declaration under Section 564(b)(1) of the Act, 21 U.S.C. section 360bbb-3(b)(1), unless the authorization is terminated or revoked.  Performed at [facility], [address] [place], [place] [address]   Culture, blood (routine x 2)     Status: None   Collection Time: 06/25/23 10:38 AM  Specimen: BLOOD  Result Value Ref Range Status   Specimen Description   Final    BLOOD LEFT ANTECUBITAL Performed at [facility], [address], [place], [place] [address]    Special Requests   Final    BOTTLES DRAWN AEROBIC AND ANAEROBIC Blood Culture adequate volume Performed at [facility], [address], [place], [place] [address]    Culture   Final    NO GROWTH 5 DAYS Performed at [facility], [address], [place], [place] [address]    Report Status 06/30/2023 FINAL  Final  Culture, blood (routine x 2)     Status: None   Collection Time: 06/25/23  1:24 PM   Specimen: BLOOD  Result Value Ref Range Status   Specimen Description   Final    BLOOD RIGHT ANTECUBITAL Performed at [facility], [address] [place], [place] [address]    Special Requests   Final    BOTTLES DRAWN AEROBIC AND ANAEROBIC Blood Culture adequate volume Performed at [facility], [address], [place], [place] [address]    Culture   Final    NO GROWTH 5 DAYS Performed at [facility], [address] [place], [place] [address]    Report Status 06/30/2023 FINAL  Final  MRSA Next Gen by PCR, Nasal     Status: None   Collection Time: 06/26/23  1:02 AM   Specimen: Nasal Mucosa; Nasal Swab  Result Value Ref Range Status   MRSA by PCR Next Gen NOT DETECTED NOT DETECTED Final    Comment: (NOTE) The GeneXpert MRSA Assay (FDA approved for NASAL specimens only), is one component of a comprehensive MRSA colonization surveillance program. It is not intended to diagnose MRSA infection nor to guide or monitor treatment for MRSA infections. Test performance is not FDA approved in patients less than 23 years old. Performed at [facility], [address], [place], [place] [address]   Respiratory (~20 pathogens) panel by PCR     Status: None   Collection Time: 06/27/23 10:23 AM   Specimen: Nasopharyngeal Swab; Respiratory  Result Value Ref Range Status   Adenovirus NOT DETECTED NOT DETECTED Final   Coronavirus 229E NOT DETECTED NOT DETECTED Final    Comment: (NOTE) The Coronavirus on the Respiratory Panel, DOES NOT test for the novel  Coronavirus (2019 nCoV)    Coronavirus HKU1 NOT DETECTED NOT DETECTED Final   Coronavirus NL63 NOT DETECTED NOT DETECTED Final   Coronavirus OC43 NOT DETECTED NOT DETECTED Final   Metapneumovirus  NOT DETECTED NOT DETECTED Final   Rhinovirus / Enterovirus NOT DETECTED NOT DETECTED Final   Influenza A NOT DETECTED NOT DETECTED Final   Influenza B NOT DETECTED NOT DETECTED Final   Parainfluenza Virus 1 NOT DETECTED NOT DETECTED Final   Parainfluenza Virus 2 NOT DETECTED NOT DETECTED Final   Parainfluenza Virus 3 NOT DETECTED NOT DETECTED Final   Parainfluenza Virus 4 NOT DETECTED NOT DETECTED Final   Respiratory Syncytial Virus NOT DETECTED NOT DETECTED Final   Bordetella pertussis NOT DETECTED NOT DETECTED Final   Bordetella Parapertussis NOT DETECTED NOT DETECTED Final   Chlamydophila pneumoniae NOT DETECTED NOT DETECTED Final   Mycoplasma pneumoniae NOT DETECTED NOT DETECTED Final    Comment: Performed at [facility], [address] [place], [place] [address]  Expectorated Sputum Assessment w Gram Stain, Rflx to Resp Cult     Status: None  Collection Time: 06/27/23 10:34 AM   Specimen: Nasopharyngeal Swab; Sputum  Result Value Ref Range Status   Specimen Description SPUTUM  Final   Special Requests NONE  Final   Sputum evaluation   Final    THIS SPECIMEN IS ACCEPTABLE FOR SPUTUM CULTURE Performed at [facility], [address], [place], [place] [address]    Report Status 06/27/2023 FINAL  Final  Culture, Respiratory w Gram Stain     Status: None   Collection Time: 06/27/23 10:34 AM   Specimen: SPU  Result Value Ref Range Status   Specimen Description   Final    SPUTUM Performed at [facility], [address] [place], [place] [address]    Special Requests   Final    NONE Reflexed from [id number] Performed at [facility], [address], [place], [place] [address]    Gram Stain   Final    MODERATE WBC PRESENT, PREDOMINANTLY PMN MODERATE GRAM NEGATIVE RODS FEW GRAM POSITIVE COCCI IN CHAINS RARE GRAM POSITIVE RODS    Culture   Final    Normal respiratory flora-no Staph aureus or Pseudomonas  seen Performed at [facility], [address], [place], [place] [address]    Report Status 06/29/2023 FINAL  Final    Labs: CBC: Recent Labs  Lab 07/04/23 0331 07/05/23 0310  WBC 8.0 7.1  HGB 8.3* 8.6*  HCT 29.8* 29.6*  MCV 89.8 88.4  PLT 197 215   Basic Metabolic Panel: Recent Labs  Lab 07/04/23 0331 07/05/23 0310 07/06/23 0309 07/07/23 1010 07/08/23 1225  NA 139 139  --   --   --   K 3.5 3.2* 3.0* 3.1* 3.9  CL 105 105  --   --   --   CO2 23 25  --   --   --   GLUCOSE 64* 94  --   --   --   BUN 20 17  --   --   --   CREATININE 0.78 0.77  --   --   --   CALCIUM 8.3* 8.4*  --   --   --   MG  --   --   --  1.9  --    Liver Function Tests: Recent Labs  Lab 07/04/23 0331 07/05/23 0310  AST 17 18  ALT 11 12  ALKPHOS 94 109  BILITOT 1.0 0.7  PROT 5.1* 5.5*  ALBUMIN 1.6* 1.5*   CBG: Recent Labs  Lab 07/07/23 0751  GLUCAP 122*    Discharge time spent: 35 minutes.  Signed: Jacquelin Hawking, MD [facility] 07/10/2023

## 2023-07-10 NOTE — TOC Transition Note (Addendum)
 Transition of Care [facility]) - Discharge Note   Patient Details  Name: Lauren Salazar MRN: [id number] Date of Birth: 1960/05/20  [facility]SW Contact:  Lanier Clam, RN Phone Number: 07/10/2023, 10:32 AM   Clinical Narrative: facility has Berkley Harvey for [facility] if stable for d/c please do d/c summary by 2p. MD updated.  -11:30a-  Patient going to [facility]-rm#[id number],report tel# [phone]. PTAR called. forms in printer. No further CM needs.   Final next level of care: Skilled Nursing Facility Barriers to Discharge: No Barriers Identified   Patient Goals and CMS Choice Patient states their goals for this hospitalization and ongoing recovery are:: Rehab CMS Medicare.gov Compare Post Acute Care list provided to:: Patient Represenative (must comment) Choice offered to / list presented to : Adult Children      Discharge Placement              Patient chooses bed at: Other - please specify in the comment section below: Patient to be transferred to facility by: PTAR Name of family member notified: [name] [id number]    Discharge Plan and Services Additional resources added to the After Visit Summary for   In-house Referral: Clinical Social Work                                   Social Drivers of Health (SDOH) Interventions SDOH Screenings   Food Insecurity: No Food Insecurity (06/26/2023)  Housing: Low Risk  (06/26/2023)  Transportation Needs: No Transportation Needs (06/26/2023)  Utilities: Not At Risk (06/26/2023)  Tobacco Use: High Risk (06/25/2023)     Readmission Risk Interventions     No data to display

## 2023-07-10 NOTE — Discharge Instructions (Signed)
 Lauren Salazar,  You were in the hospital with pneumonia. This has been treated with antibiotics. Your oncologist believes it is in your best interest to enroll in hospice services. Please have continued discussions regarding goals of care with your outpatient physicians. I have recommended that you follow-up with outpatient palliative care.

## 2023-07-10 NOTE — Progress Notes (Signed)
 Unable to give report. Left phone number with front desk for RN to call me back for report when available.

## 2023-08-07 ENCOUNTER — Telehealth: Payer: Self-pay | Admitting: *Deleted

## 2023-08-07 NOTE — Telephone Encounter (Signed)
 TCT pt's daughter as pt is scheduled for an appt with Dr. Rosaline Coma tomorrow. Spoke with her. Lauren Salazar states pt died last week on Aug 09, 2023 She was transferred from her rehab facility to [facility] on 08/08/23 and died the next day.

## 2023-08-08 ENCOUNTER — Inpatient Hospital Stay: Admitting: Hematology and Oncology

## 2023-08-08 ENCOUNTER — Inpatient Hospital Stay

## 2023-08-24 DEATH — deceased

## 2023-12-28 ENCOUNTER — Other Ambulatory Visit (HOSPITAL_BASED_OUTPATIENT_CLINIC_OR_DEPARTMENT_OTHER): Payer: Self-pay
